# Patient Record
Sex: Male | Born: 1935 | Race: White | Hispanic: No | Marital: Married | Smoking: Former smoker
Health system: Southern US, Community
[De-identification: ages and names within clinical notes are randomized; demographics above are authoritative.]

## PROBLEM LIST (undated history)

## (undated) DIAGNOSIS — I6522 Occlusion and stenosis of left carotid artery: Secondary | ICD-10-CM

## (undated) DIAGNOSIS — Z91018 Allergy to other foods: Secondary | ICD-10-CM

## (undated) DIAGNOSIS — N201 Calculus of ureter: Secondary | ICD-10-CM

## (undated) DIAGNOSIS — J439 Emphysema, unspecified: Secondary | ICD-10-CM

## (undated) DIAGNOSIS — M1990 Unspecified osteoarthritis, unspecified site: Secondary | ICD-10-CM

## (undated) DIAGNOSIS — R0600 Dyspnea, unspecified: Secondary | ICD-10-CM

## (undated) DIAGNOSIS — C44309 Unspecified malignant neoplasm of skin of other parts of face: Secondary | ICD-10-CM

## (undated) DIAGNOSIS — F419 Anxiety disorder, unspecified: Secondary | ICD-10-CM

## (undated) DIAGNOSIS — I440 Atrioventricular block, first degree: Secondary | ICD-10-CM

## (undated) DIAGNOSIS — I6501 Occlusion and stenosis of right vertebral artery: Secondary | ICD-10-CM

## (undated) DIAGNOSIS — K559 Vascular disorder of intestine, unspecified: Secondary | ICD-10-CM

## (undated) DIAGNOSIS — G43909 Migraine, unspecified, not intractable, without status migrainosus: Secondary | ICD-10-CM

## (undated) DIAGNOSIS — R0609 Other forms of dyspnea: Secondary | ICD-10-CM

## (undated) DIAGNOSIS — G4730 Sleep apnea, unspecified: Secondary | ICD-10-CM

## (undated) DIAGNOSIS — R51 Headache: Secondary | ICD-10-CM

## (undated) DIAGNOSIS — E785 Hyperlipidemia, unspecified: Secondary | ICD-10-CM

## (undated) DIAGNOSIS — Z87442 Personal history of urinary calculi: Secondary | ICD-10-CM

## (undated) DIAGNOSIS — I714 Abdominal aortic aneurysm, without rupture, unspecified: Secondary | ICD-10-CM

## (undated) DIAGNOSIS — D126 Benign neoplasm of colon, unspecified: Secondary | ICD-10-CM

## (undated) DIAGNOSIS — L509 Urticaria, unspecified: Secondary | ICD-10-CM

## (undated) DIAGNOSIS — I739 Peripheral vascular disease, unspecified: Secondary | ICD-10-CM

## (undated) DIAGNOSIS — H9190 Unspecified hearing loss, unspecified ear: Secondary | ICD-10-CM

## (undated) DIAGNOSIS — K219 Gastro-esophageal reflux disease without esophagitis: Secondary | ICD-10-CM

## (undated) DIAGNOSIS — Z959 Presence of cardiac and vascular implant and graft, unspecified: Secondary | ICD-10-CM

## (undated) DIAGNOSIS — I10 Essential (primary) hypertension: Secondary | ICD-10-CM

## (undated) HISTORY — PX: LUMBAR DISC SURGERY: SHX700

## (undated) HISTORY — PX: SOFT TISSUE CYST EXCISION: SHX2418

## (undated) HISTORY — DX: Urticaria, unspecified: L50.9

## (undated) HISTORY — DX: Abdominal aortic aneurysm, without rupture, unspecified: I71.40

## (undated) HISTORY — DX: Abdominal aortic aneurysm, without rupture: I71.4

## (undated) HISTORY — PX: CARDIOVASCULAR STRESS TEST: SHX262

## (undated) HISTORY — DX: Benign neoplasm of colon, unspecified: D12.6

## (undated) HISTORY — DX: Vascular disorder of intestine, unspecified: K55.9

## (undated) HISTORY — PX: BACK SURGERY: SHX140

## (undated) HISTORY — PX: CARDIAC CATHETERIZATION: SHX172

---

## 1999-03-20 ENCOUNTER — Encounter: Payer: Self-pay | Admitting: Family Medicine

## 1999-03-20 ENCOUNTER — Encounter: Admission: RE | Admit: 1999-03-20 | Discharge: 1999-03-20 | Payer: Self-pay | Admitting: Family Medicine

## 2002-10-26 ENCOUNTER — Ambulatory Visit (HOSPITAL_COMMUNITY): Admission: RE | Admit: 2002-10-26 | Discharge: 2002-10-26 | Payer: Self-pay | Admitting: *Deleted

## 2002-10-26 ENCOUNTER — Encounter: Payer: Self-pay | Admitting: *Deleted

## 2005-08-09 ENCOUNTER — Encounter: Admission: RE | Admit: 2005-08-09 | Discharge: 2005-08-09 | Payer: Self-pay | Admitting: Internal Medicine

## 2006-10-30 ENCOUNTER — Emergency Department (HOSPITAL_COMMUNITY): Admission: EM | Admit: 2006-10-30 | Discharge: 2006-10-31 | Payer: Self-pay | Admitting: Emergency Medicine

## 2007-02-14 ENCOUNTER — Emergency Department (HOSPITAL_COMMUNITY): Admission: EM | Admit: 2007-02-14 | Discharge: 2007-02-14 | Payer: Self-pay | Admitting: Emergency Medicine

## 2010-06-17 ENCOUNTER — Ambulatory Visit
Admission: RE | Admit: 2010-06-17 | Discharge: 2010-06-17 | Disposition: A | Discharge status: Home / Self Care | Payer: Medicare Other | Source: Ambulatory Visit | Attending: Cardiovascular Disease | Admitting: Cardiovascular Disease

## 2010-06-17 ENCOUNTER — Other Ambulatory Visit: Payer: Self-pay | Admitting: Cardiovascular Disease

## 2010-06-17 DIAGNOSIS — R0602 Shortness of breath: Secondary | ICD-10-CM

## 2010-06-17 DIAGNOSIS — Z720 Tobacco use: Secondary | ICD-10-CM

## 2010-06-17 DIAGNOSIS — Z01811 Encounter for preprocedural respiratory examination: Secondary | ICD-10-CM

## 2010-06-19 NOTE — Cardiovascular Report (Signed)
NAME:  Douglas Monroe, Douglas Monroe                          ACCOUNT NO.:  [id number]   MEDICAL RECORD NO.:  [id number]                   PATIENT TYPE:  OIB   LOCATION:  2899                                 FACILITY:  [facility]   PHYSICIAN:  Darlin Priestly, M.D.             DATE OF BIRTH:  Sep 03, 1932   DATE OF PROCEDURE:  10/26/2002  DATE OF DISCHARGE:                              CARDIAC CATHETERIZATION   PROCEDURES:  1. Left heart catheterization.  2. Coronary angiography.  3. Left ventriculogram.  4. Abdominal aortogram.   ATTENDING PHYSICIAN:  Darlin Priestly, M.D.   COMPLICATIONS:  None.   INDICATIONS:  [name] is a 77 year old male patient of Dr. Newt Minion  with a history of hypertension, tobacco use who has intermittent substernal  chest pain occurring both at rest and with exertion radiating to his neck,  jaw and shoulders.  He did have a Cardiolite scan revealing no significant  ischemia.  However, he has had continuous ongoing  symptoms.  He is now  brought for cardiac catheterization to define his coronary anatomy.   DESCRIPTION OF OPERATION:  After obtaining informed written consent, the  patient was brought to the cardiac catheterization laboratory.  Right and  left groin was shaved and prepped and draped in the usual sterile fashion.  ECG monitor was established.  Using the modified Seldinger technique, a 6  French arterial sheath was inserted in the right femoral artery. A 6 French  diagnostic catheter was then used to performed diagnostic angiography.  This  reveals a large left main with no significant disease.  The LAD is a large  vessel that coursed to the apex and gives rise to one diagonal branch.  The  LAD has no significant disease.  The first diagonal is a large  vessel that  bifurcates distally and has no significant disease.   Left coronary artery gives rise to a large ramus intermedius which  bifurcates in its proximal portion.  There is no significant  disease in the  ramus.   Left circumflex is a large vessel which courses in AV groove and goes to two  obtuse marginal branches.  The AV groove circ has no significant disease.   The first obtuse marginal is a large vessel that bifurcates distally.  There  is no significant disease.  The second obtuse marginal is a small vessel  with no significant disease.   The right coronary artery is a large vessel and is dominant and gives rise  to be both PDA as well as posterior lateral branch. There is no significant  disease in the RCA, PDA or posterior lateral branch.   LEFT VENTRICULOGRAM:  Left ventriculogram reveals preserved EF of 60%.   ABDOMINAL AORTOGRAM:  Reveals no evidence of renal artery stenosis.   HEMODYNAMICS:  Systemic arterial pressure 162/82, LV pressure 164/4, LVEDP  of 10.   Following conclusion of the case, the  right femoral site was then closed  using an Angio-Seal device without complication.  The patient was  transferred to the recovery room in stable condition.   CONCLUSIONS:  1. No significant coronary artery disease.  2. Normal left ventricular systolic function.  3. No evidence of renal artery stenosis.  4. Systemic hypertension.                                                   Darlin Priestly, M.D.    RHM/MEDQ  D:  10/26/2002  T:  10/26/2002  Job:  [id number]

## 2010-06-22 ENCOUNTER — Ambulatory Visit (HOSPITAL_COMMUNITY)
Admission: RE | Admit: 2010-06-22 | Discharge: 2010-06-23 | Disposition: A | Discharge status: Home / Self Care | Payer: Medicare Other | Source: Ambulatory Visit | Attending: Cardiovascular Disease | Admitting: Cardiovascular Disease

## 2010-06-22 DIAGNOSIS — I70219 Atherosclerosis of native arteries of extremities with intermittent claudication, unspecified extremity: Secondary | ICD-10-CM | POA: Insufficient documentation

## 2010-06-22 DIAGNOSIS — J4489 Other specified chronic obstructive pulmonary disease: Secondary | ICD-10-CM | POA: Insufficient documentation

## 2010-06-22 DIAGNOSIS — J449 Chronic obstructive pulmonary disease, unspecified: Secondary | ICD-10-CM | POA: Insufficient documentation

## 2010-06-22 DIAGNOSIS — I701 Atherosclerosis of renal artery: Secondary | ICD-10-CM | POA: Insufficient documentation

## 2010-06-22 DIAGNOSIS — I714 Abdominal aortic aneurysm, without rupture, unspecified: Secondary | ICD-10-CM | POA: Insufficient documentation

## 2010-06-22 DIAGNOSIS — I10 Essential (primary) hypertension: Secondary | ICD-10-CM | POA: Insufficient documentation

## 2010-06-22 DIAGNOSIS — F17200 Nicotine dependence, unspecified, uncomplicated: Secondary | ICD-10-CM | POA: Insufficient documentation

## 2010-06-22 HISTORY — PX: ABDOMINAL ANGIOGRAM: SHX5705

## 2010-06-22 LAB — POCT ACTIVATED CLOTTING TIME
Activated Clotting Time: 240 seconds
Activated Clotting Time: 140 seconds

## 2010-06-23 LAB — BASIC METABOLIC PANEL
CO2: 26 mEq/L (ref 19–32)
Calcium: 9.1 mg/dL (ref 8.4–10.5)
Creatinine, Ser: 0.67 mg/dL (ref 0.4–1.5)
GFR calc Af Amer: >60 mL/min (ref >60)
GFR calc non Af Amer: >60 mL/min (ref >60)
Sodium: 140 mEq/L (ref 135–145)

## 2010-06-23 LAB — CBC
Hemoglobin: 13.7 g/dL (ref 13.0–17.0)
MCH: 32.4 pg (ref 26.0–34.0)
MCHC: 34.2 g/dL (ref 30.0–36.0)
RDW: 14.6 % (ref 11.5–15.5)

## 2010-06-25 NOTE — Procedures (Signed)
NAME:  AHMON, TOSI NO.:  [id number]  MEDICAL RECORD NO.:  [id number]           PATIENT TYPE:  O  LOCATION:  6531                         FACILITY:  [facility]  PHYSICIAN:  Nanetta Batty, M.D.   DATE OF BIRTH:  Jun 08, 1932  DATE OF PROCEDURE: DATE OF DISCHARGE:                   PERIPHERAL VASCULAR INVASIVE PROCEDURE   Douglas Monroe is a 77 year old thin-appearing married Caucasian male, father of two, grandfather of five grandchildren, who still works part-time in Clinical biochemist.  I last saw him 4 years ago.  He had progressive claudication with abnormal Dopplers.  His risk factors include ongoing tobacco abuse of 1 pack per day with COPD, as well as hypertension.  He has had a cath in the past that showed normal coronary arteries and normal LV function.  He complained of increasing lower extremity claudication, right greater than left, over the last 3-4 months.  Lower extremity Dopplers performed on May 26, 2010, revealed right ABI of 0.5 with high-grade external iliac artery stenosis, total right SFA popliteal, and left ABI 0.83 with high-grade left iliac stenosis.  He presents now for angiography and a potential intervention.  PROCEDURE DESCRIPTION:  The patient was brought to the second floor of [place] [facility] in the post absorptive state.  He was premedicated with p.o. Valium.  His left groin was prepped and shaved using the usual sterile fashion.  Xylocaine 1% was used for local anesthesia.  A 5-French sheath was inserted into the left femoral artery using standard Seldinger technique.  A 5-French pigtail catheter was used for abdominal aortography, bifemoral runoff using bolus chase digital subtraction step table technique.  Visipaque dye was used for the entirety of the case.  Retrograde aortic pressure were monitored during the case.  ANGIOGRAPHIC RESULTS: 1. Abdominal aorta.     a.     Renal arteries - 80% proximal left renal  stenosis.     b.     Infrarenal abdominal aorta - moderate focal narrowing in the      midportion followed by a saccular aneurysm which measured 3.1 cm      by duplex ultrasound in the office. 2. Left lower extremity.     a.     A 30% ostial left common iliac artery stenosis and pullback      gradient.     b.     A 90% left external iliac artery stenosis with a 50-mm      pullback.     c.     One-vessel runoff to the posterior tibial. 3. Right lower extremity.     a.     A 70% right external iliac artery stenosis with 40-mm      pullback.     b.     A 90% right common femoral artery stenosis.     c.     A 70% long segmental proximal right SFA with a moderately      long segment occlusion in the mid right SFA and three-vessel      runoff.  There was a 90% proximal right anterior tibial.  PROCEDURE DESCRIPTION:  The patient received 5000 units of  heparin intravenously with an ACT of 240.  Total contrast to the patient was 261.  The 5-French sheath was exchanged for a 6-French Destination crossover sheath.  A 0.025 Versacore wire crossed the left external iliac artery after pullback gradient noted.  No physiologic significance of the left common iliac with a 50% gradient of the left external iliac. PTA was performed with 5 x 3 balloon to allow passage of the Destination sheath without obstruction.  Contralateral access was obtained with a crossover catheter and Versacore wire.  PTA was performed of the right common femoral with a 5 x 2 balloon and stenting with a 8 x 3 Absolute Pro nitinol self-expanding stent.  Postdilatation was performed with a 7 x 3, resulting in reduction of 90% fairly focal right common femoral artery stenosis with two 0% residual.  Pullback was then performed with the Destination sheath across the proximal right external iliac artery revealing a 40-mm gradient and this was then stented with a 9 x 3 Absolute Pro nitinol self-expanding stent postdilated with a 7 x  3 balloon resulting in reduction of a 70% stenosis to 0% residual. Sheaths were then withdrawn across the bifurcation and the left external iliac artery was then stented with an 8 x 4 Absolute Pro and postdilated with a 73 balloon resulting in reduction of 90% stenosis to 0% residual. The patient tolerated the procedure well.  The guidewire was removed. The patient left the lab in stable condition.  Received 600 mg of Plavix p.o. and will be treated with aspirin and Plavix.  He will be gently hydrated overnight and discharged home in the morning.  He will get lower extremity Dopplers and ABIs in the office and will see me back. At that time, we will decide whether or not he needs staged right SFA intervention for lifestyle-limiting claudication.     Nanetta Batty, M.D.     Cordelia Pen  D:  06/22/2010  T:  06/22/2010  Job:  [id number]  cc:   [facility]. [facility]. Robyn N. Allyne Gee, M.D.  Electronically Signed by Nanetta Batty M.D. on 06/25/2010 03:43:41 PM

## 2010-07-15 NOTE — Discharge Summary (Signed)
NAME:  Douglas Monroe, Douglas Monroe                ACCOUNT NO.:  [id number]  MEDICAL RECORD NO.:  [id number]           PATIENT TYPE:  O  LOCATION:  6531                         FACILITY:  [facility]  PHYSICIAN:  Nanetta Batty, M.D.   DATE OF BIRTH:  1932-02-21  DATE OF ADMISSION:  06/22/2010 DATE OF DISCHARGE:  06/23/2010                              DISCHARGE SUMMARY   DISCHARGE DIAGNOSES: 1. Claudication of lower extremities with abnormal Dopplers. 2. Peripheral arterial disease with right common femoral artery     stenosis undergoing percutaneous transluminal angioplasty and     stent, right external iliac artery stenosis, undergoing     percutaneous transluminal angioplasty and stent, and left external     iliac artery stenosis undergoing percutaneous transluminal     angioplasty and stent. 3. Tobacco use. 4. Chronic obstructive pulmonary disease. 5. History of normal coronaries, normal EF by cardiac cath. 6. History of moderate disease in left carotid bulb.  DISCHARGE CONDITION:  Stable.  PROCEDURES:  PV angiogram by Dr. Nanetta Batty Jun 22, 2010.  PTA and stent to the right CFA.  Right EIA and left EIA, all by Dr. Allyson Sabal on Jun 22, 2010.  Please see dictated report.  Please note the patient may need staged right SFA.  HISTORY OF PRESENT ILLNESS:  A 77 year old white male who works part- time, referred to Dr. Allyson Sabal for claudication and abnormal Doppler study. He does have a history of COPD and smokes one-pack a day.  He has hypertension though he has normal course on his last cardiac catheterization in September 2004.  He denies chest pain.  He recently had lower extremity Dopplers due to leg pain that revealed ABI on the right 0.5 with high-grade right external iliac stenosis and total right SFA and popliteal.  His left ABI was 0.83 with a high-grade left iliac stenosis.  Also to be noted, his last carotid Doppler in our office 4 years ago showed moderate disease in his left carotid  bulb and this has not been rechecked.  Dr. Allyson Sabal felt the patient needed to undergo PV angiogram due to lifestyle-limiting claudication.  He was brought in and underwent procedure, which he tolerated well.  By the next morning, sodium 140, potassium 3.5, BUN 14, creatinine 0.67, glucose 94, hemoglobin 13.7, hematocrit 40.1, WBC 8.3, platelets 148.  Blood pressure 144/70, pulse 74, respiratory rate 23, temperature 98.3, oxygen saturation 91%. HEART:  Regular rate and rhythm. LUNGS:  Few crackles. ABDOMEN:  Soft. EXTREMITIES:  No bruising at cath site.  Both legs were warm to touch.  Dr. Clarene Duke saw him.  He discussed stopping tobacco use.  Lipitor was added to his medical regimen.  Tobacco cessation consultation was also obtained.  The patient ambulated and was discharged home.  DISCHARGE INSTRUCTIONS: 1. Increase activity slowly.  May shower.  No lifting for 1 week.  No     driving for 2 days.  Low-sodium heart-healthy diet.  Wash cath site     with soap and water.  Call if any bleeding, swelling, or drainage. 2. Follow up with Dr. Allyson Sabal July 21, 2010, at 1:45 p.m. 3. Followup  Dopplers are July 16, 2010, at 4 p.m.  DISCHARGE MEDICATION RECONCILIATION:  Please see form to the [facility] computer, but note we did add Lipitor 20 mg daily, Plavix 75 daily, and aspirin.  The patient will follow up as instructed.     Darcella Gasman. Annie Paras, N.P.   ______________________________ Nanetta Batty, M.D.    LRI/MEDQ  D:  06/23/2010  T:  06/24/2010  Job:  [id number]  cc:   Candyce Churn. Allyne Gee, M.D.  Electronically Signed by Nada Boozer N.P. on 07/01/2010 04:36:26 PM Electronically Signed by Nanetta Batty M.D. on 07/15/2010 10:44:00 AM

## 2010-10-08 ENCOUNTER — Ambulatory Visit (HOSPITAL_COMMUNITY)
Admission: RE | Admit: 2010-10-08 | Discharge: 2010-10-09 | Disposition: A | Discharge status: Home / Self Care | Payer: Medicare Other | Source: Ambulatory Visit | Attending: Cardiovascular Disease | Admitting: Cardiovascular Disease

## 2010-10-08 DIAGNOSIS — J4489 Other specified chronic obstructive pulmonary disease: Secondary | ICD-10-CM | POA: Insufficient documentation

## 2010-10-08 DIAGNOSIS — I70219 Atherosclerosis of native arteries of extremities with intermittent claudication, unspecified extremity: Secondary | ICD-10-CM | POA: Insufficient documentation

## 2010-10-08 DIAGNOSIS — I7092 Chronic total occlusion of artery of the extremities: Secondary | ICD-10-CM | POA: Insufficient documentation

## 2010-10-08 DIAGNOSIS — I10 Essential (primary) hypertension: Secondary | ICD-10-CM | POA: Insufficient documentation

## 2010-10-08 DIAGNOSIS — J449 Chronic obstructive pulmonary disease, unspecified: Secondary | ICD-10-CM | POA: Insufficient documentation

## 2010-10-08 DIAGNOSIS — Z01812 Encounter for preprocedural laboratory examination: Secondary | ICD-10-CM | POA: Insufficient documentation

## 2010-10-08 HISTORY — PX: ABDOMINAL ANGIOGRAM: SHX5705

## 2010-10-08 LAB — CBC
Hemoglobin: 13.5 g/dL (ref 13.0–17.0)
MCH: 31.5 pg (ref 26.0–34.0)
MCHC: 33.5 g/dL (ref 30.0–36.0)
Platelets: 164 10*3/uL (ref 150–400)
RBC: 4.29 MIL/uL (ref 4.22–5.81)

## 2010-10-08 LAB — BASIC METABOLIC PANEL
BUN: 22 mg/dL (ref 6–23)
Creatinine, Ser: 0.87 mg/dL (ref 0.50–1.35)
GFR calc Af Amer: >60 mL/min (ref >60)
GFR calc non Af Amer: >60 mL/min (ref >60)
Potassium: 4.1 mEq/L (ref 3.5–5.1)

## 2010-10-08 LAB — PROTIME-INR
INR: 0.89 (ref 0.00–1.49)
Prothrombin Time: 12.2 seconds (ref 11.6–15.2)

## 2010-10-08 LAB — POCT ACTIVATED CLOTTING TIME: Activated Clotting Time: 254 seconds

## 2010-10-09 LAB — CBC
MCH: 32 pg (ref 26.0–34.0)
MCV: 95.6 fL (ref 78.0–100.0)
Platelets: 155 10*3/uL (ref 150–400)
RDW: 14.2 % (ref 11.5–15.5)

## 2010-10-09 LAB — BASIC METABOLIC PANEL
Calcium: 9.4 mg/dL (ref 8.4–10.5)
Creatinine, Ser: 0.95 mg/dL (ref 0.50–1.35)
GFR calc Af Amer: >60 mL/min (ref >60)
GFR calc non Af Amer: >60 mL/min (ref >60)
Sodium: 140 mEq/L (ref 135–145)

## 2010-10-22 LAB — CBC
MCHC: 33.8
RBC: 4.57
WBC: 7.9

## 2010-10-22 LAB — POCT CARDIAC MARKERS
CKMB, poc: 1.1
Myoglobin, poc: 56.8

## 2010-10-22 LAB — BASIC METABOLIC PANEL
CO2: 28
Calcium: 9.2
Creatinine, Ser: 0.87
GFR calc Af Amer: >60
GFR calc non Af Amer: >60

## 2010-10-22 LAB — DIFFERENTIAL
Basophils Relative: 0
Lymphocytes Relative: 17
Monocytes Relative: 8
Neutro Abs: 5.7
Neutrophils Relative %: 72

## 2010-11-01 NOTE — Discharge Summary (Signed)
  NAME:  Douglas Monroe, Douglas Monroe NO.:  [id number]  MEDICAL RECORD NO.:  [id number]  LOCATION:  2506                         FACILITY:  [facility]  PHYSICIAN:  Nanetta Batty, M.D.   DATE OF BIRTH:  08-Mar-1932  DATE OF ADMISSION:  10/08/2010 DATE OF DISCHARGE:  10/09/2010                              DISCHARGE SUMMARY   DISCHARGE DIAGNOSES: 1. Claudication. 2. Peripheral vascular disease with elective right superficial femoral     artery stenting this admission.  Prior history of right common     femoral stenting in May 2012, right external iliac stenting in May     2012, and left external iliac artery stenting in May 2012. 3. Hypertension with a history of 80% left renal artery narrowing, May     2012. 4. Chronic obstructive pulmonary disease. 5. Normal coronaries in 2004.  HOSPITAL COURSE:  The patient is a 77 year old male with a history of claudication.  He underwent a multisite intervention in May 2012.  He continues to have claudication in his right leg.  He was admitted for elective PV angiogram.  This was done on October 08, 2010.  He had a totally occluded mid right SFA which was dilated and stented.  See Dr. Hazle Coca note for complete details.  He tolerated procedure well and will be discharged on October 09, 2010.  Please see med rec for complete discharge medications.  DISCHARGE LABORATORY DATA:  Sodium 140, potassium 3.9, BUN 19, and creatinine 0.95.  White count 7.3, hemoglobin 13.1, hematocrit 39.2, and platelets 155.  X-ray shows hyperinflation with no acute disease, this was in May 2012.  Telemetry shows sinus rhythm.  DISPOSITION:  The patient will be discharged in stable condition and will follow up with Dr. Allyson Sabal after lower extremity arterial Dopplers.     Abelino Derrick, P.A.   ______________________________ Nanetta Batty, M.D.    Lenard Lance  D:  10/09/2010  T:  10/09/2010  Job:  [id number]  Electronically Signed by Corine Shelter P.A. on  10/09/2010 04:53:13 PM Electronically Signed by Nanetta Batty M.D. on 11/01/2010 11:42:33 AM

## 2010-11-01 NOTE — Procedures (Signed)
NAME:  Douglas Monroe, Douglas Monroe NO.:  [id number]  MEDICAL RECORD NO.:  [id number]  LOCATION:  2506                         FACILITY:  [facility]  PHYSICIAN:  Nanetta Batty, M.D.   DATE OF BIRTH:  10-Jan-1933  DATE OF PROCEDURE: DATE OF DISCHARGE:                   PERIPHERAL VASCULAR INVASIVE PROCEDURE   PROCEDURE:  Peripheral angiogram/percutaneous transluminal angioplasty stent.  INDICATIONS FOR PROCEDURE:  Ms. Micalizzi is a 77 year old thin-appearing Caucasian male, father of two, grandfather of 5 grandchildren, who works part time Clinical biochemist.  He was referred to me for claudication. His risk factors include discontinued tobacco abuse, COPD, hypertension, and hyperlipidemia.  He was cathed on July 06, 2010, and found to have normal coronary arteries and normal LV function.  He has been complaining of right greater than left lower extremity claudication with abnormal ABIs bilaterally.  I recently did an angiogram on him and stented both right and left iliac arteries.  He has a total right SFA with continued lifestyle-limiting claudication.  He presents now for attempted PTA and stenting of his right SFA initially using the [employer] crossing device."  PROCEDURE DESCRIPTION:  The patient was brought to the second floor [place] [facility] angi[facility] suite in postabsorptive state.  He was premedicated with p.o. Valium, IV Versed, and fentanyl.  His left groin was prepped and shaved in usual sterile fashion.  Xylocaine 1% was used for local anesthesia.  A 7-French crossover sheath was inserted to the left femoral artery using standard Seldinger technique.  Contralateral access was obtained with a crossover catheter and Wholey wire.  The patient received 4000 units of heparin intravenously with a beginning ACT of 215 and ending of approximately 199.  A total 169 mL of contrast was administered to the patient __________.  Visipaque dye was used for the  entirety of the case.  Retrograde aortic pressure was monitored during the case.  Attempts were made to use the TruePath crossing device.  However, I think we selected a false lumen and we were subintimal.  After multiple attempts using the 0.18 CTX crossing catheter, I exchanged for a 0.35 Terumo NaviCross directional crossing catheter and angled Glidewire which fairly easily crossed the lesion and went into the true lumen which was demonstrated angiographically.  This was then exchanged for a 0.35 VersaCore.  PTA was performed with a 4 x 10 balloon, stenting with a 6 x 120 EV3 Protege nitinol self-expanding stent.  Postdilatation with a 5 x 10 balloon, resulting in reduction of a moderate right SFA CTO to 0% residual.  Completion angiography revealed it to be widely patent with three-vessel runoff and high-grade anterior tibial disease.  It should be noted that the whole proximal segment of the SFA was diffusely 50% to 60% stenosed.  IMPRESSION:  Successful percutaneous transluminal angioplasty and stenting of a moderately long segment mid-right superficial femoral artery chronic total occlusion using a nitinol self-expanding stent. The patient will be treated with aspirin and Plavix, hydrate overnight, and discharged home in the morning.  I will get followup Dopplers and ABIs and he will see me back in the office in followup.  He left the lab in stable condition.     Nanetta Batty, M.D.  JB/MEDQ  D:  10/08/2010  T:  10/08/2010  Job:  [id number]  cc:   [facility] Robyn N. Allyne Gee, M.D. [facility] Second Floor [facility]  Electronically Signed by Nanetta Batty M.D. on 11/01/2010 11:42:38 AM

## 2010-11-12 LAB — I-STAT 8, (EC8 V) (CONVERTED LAB)
Acid-Base Excess: 1
Bicarbonate: 28.1 — ABNORMAL HIGH
HCT: 44
Operator id: 277751
TCO2: 30
pCO2, Ven: 51.6 — ABNORMAL HIGH

## 2010-11-12 LAB — CBC
MCHC: 34.2
RBC: 4.21 — ABNORMAL LOW
RDW: 14.2 — ABNORMAL HIGH

## 2010-11-12 LAB — DIFFERENTIAL
Basophils Absolute: 0.1
Basophils Relative: 1
Monocytes Relative: 11
Neutro Abs: 3.8
Neutrophils Relative %: 52

## 2010-11-12 LAB — POCT I-STAT CREATININE: Creatinine, Ser: 1

## 2011-08-03 ENCOUNTER — Other Ambulatory Visit: Payer: Self-pay | Admitting: Cardiovascular Disease

## 2011-08-04 ENCOUNTER — Encounter (HOSPITAL_COMMUNITY): Payer: Self-pay | Admitting: Pharmacy Technician

## 2011-08-19 ENCOUNTER — Encounter (HOSPITAL_COMMUNITY)
Admission: RE | Disposition: A | Discharge status: Home / Self Care | Payer: Self-pay | Source: Ambulatory Visit | Attending: Cardiovascular Disease

## 2011-08-19 ENCOUNTER — Encounter (HOSPITAL_COMMUNITY): Payer: Self-pay | Admitting: General Practice

## 2011-08-19 ENCOUNTER — Ambulatory Visit (HOSPITAL_COMMUNITY)
Admission: RE | Admit: 2011-08-19 | Discharge: 2011-08-20 | Disposition: A | Discharge status: Home / Self Care | Payer: Medicare Other | Source: Ambulatory Visit | Attending: Cardiovascular Disease | Admitting: Cardiovascular Disease

## 2011-08-19 DIAGNOSIS — I10 Essential (primary) hypertension: Secondary | ICD-10-CM | POA: Diagnosis present

## 2011-08-19 DIAGNOSIS — I739 Peripheral vascular disease, unspecified: Secondary | ICD-10-CM | POA: Diagnosis present

## 2011-08-19 DIAGNOSIS — Z95820 Peripheral vascular angioplasty status with implants and grafts: Secondary | ICD-10-CM

## 2011-08-19 DIAGNOSIS — J449 Chronic obstructive pulmonary disease, unspecified: Secondary | ICD-10-CM | POA: Insufficient documentation

## 2011-08-19 DIAGNOSIS — E785 Hyperlipidemia, unspecified: Secondary | ICD-10-CM | POA: Insufficient documentation

## 2011-08-19 DIAGNOSIS — I70219 Atherosclerosis of native arteries of extremities with intermittent claudication, unspecified extremity: Secondary | ICD-10-CM | POA: Insufficient documentation

## 2011-08-19 DIAGNOSIS — J4489 Other specified chronic obstructive pulmonary disease: Secondary | ICD-10-CM | POA: Insufficient documentation

## 2011-08-19 HISTORY — DX: Peripheral vascular disease, unspecified: I73.9

## 2011-08-19 HISTORY — PX: ATHERECTOMY: SHX5502

## 2011-08-19 HISTORY — DX: Other forms of dyspnea: R06.09

## 2011-08-19 HISTORY — DX: Gastro-esophageal reflux disease without esophagitis: K21.9

## 2011-08-19 HISTORY — DX: Dyspnea, unspecified: R06.00

## 2011-08-19 HISTORY — PX: ABDOMINAL ANGIOGRAM: SHX5705

## 2011-08-19 HISTORY — DX: Essential (primary) hypertension: I10

## 2011-08-19 HISTORY — DX: Sleep apnea, unspecified: G47.30

## 2011-08-19 LAB — CBC
HCT: 41.7 % (ref 39.0–52.0)
Hemoglobin: 14 g/dL (ref 13.0–17.0)
MCHC: 33.6 g/dL (ref 30.0–36.0)
MCV: 92.9 fL (ref 78.0–100.0)

## 2011-08-19 LAB — POCT ACTIVATED CLOTTING TIME: Activated Clotting Time: 189 s

## 2011-08-19 SURGERY — ATHERECTOMY
Anesthesia: LOCAL

## 2011-08-19 MED ORDER — SODIUM CHLORIDE 0.9 % IV SOLN
INTRAVENOUS | Status: AC
  Administered 2011-08-19: 20:00:00 via INTRAVENOUS
  Administered 2011-08-19: 75 mL/h via INTRAVENOUS

## 2011-08-19 MED ORDER — ATORVASTATIN CALCIUM 20 MG PO TABS
20.0000 mg | ORAL_TABLET | Freq: Every morning | ORAL | Status: DC
  Filled 2011-08-19: qty 1

## 2011-08-19 MED ORDER — FENTANYL CITRATE 0.05 MG/ML IJ SOLN
INTRAMUSCULAR | Status: AC
  Filled 2011-08-19: qty 2

## 2011-08-19 MED ORDER — HEPARIN SODIUM (PORCINE) 1000 UNIT/ML IJ SOLN
INTRAMUSCULAR | Status: AC
  Filled 2011-08-19: qty 1

## 2011-08-19 MED ORDER — SODIUM CHLORIDE 0.9 % IJ SOLN: 3.0000 mL | INTRAMUSCULAR | Status: DC | PRN

## 2011-08-19 MED ORDER — ONDANSETRON HCL 4 MG/2ML IJ SOLN: 4.0000 mg | Freq: Four times a day (QID) | INTRAMUSCULAR | Status: DC | PRN

## 2011-08-19 MED ORDER — TIOTROPIUM BROMIDE MONOHYDRATE 18 MCG IN CAPS
18.0000 ug | ORAL_CAPSULE | Freq: Every day | RESPIRATORY_TRACT | Status: DC
  Filled 2011-08-19: qty 5

## 2011-08-19 MED ORDER — HEPARIN (PORCINE) IN NACL 2-0.9 UNIT/ML-% IJ SOLN
INTRAMUSCULAR | Status: AC
  Filled 2011-08-19: qty 1000

## 2011-08-19 MED ORDER — NEBIVOLOL HCL 10 MG PO TABS
10.0000 mg | ORAL_TABLET | Freq: Every morning | ORAL | Status: DC
  Filled 2011-08-19: qty 1

## 2011-08-19 MED ORDER — CLOPIDOGREL BISULFATE 75 MG PO TABS: 75.0000 mg | ORAL_TABLET | Freq: Every morning | ORAL | Status: DC

## 2011-08-19 MED ORDER — DIAZEPAM 5 MG PO TABS
5.0000 mg | ORAL_TABLET | ORAL | Status: AC
  Administered 2011-08-19: 5 mg via ORAL

## 2011-08-19 MED ORDER — DIAZEPAM 5 MG PO TABS
ORAL_TABLET | ORAL | Status: AC
  Administered 2011-08-19: 5 mg via ORAL
  Filled 2011-08-19: qty 1

## 2011-08-19 MED ORDER — CLOPIDOGREL BISULFATE 75 MG PO TABS
75.0000 mg | ORAL_TABLET | Freq: Every day | ORAL | Status: DC
  Filled 2011-08-19: qty 1

## 2011-08-19 MED ORDER — FINASTERIDE 5 MG PO TABS
5.0000 mg | ORAL_TABLET | Freq: Every morning | ORAL | Status: DC
  Filled 2011-08-19: qty 1

## 2011-08-19 MED ORDER — FAMOTIDINE 20 MG PO TABS
20.0000 mg | ORAL_TABLET | Freq: Every day | ORAL | Status: DC
  Administered 2011-08-19: 17:00:00 20 mg via ORAL
  Filled 2011-08-19 (×2): qty 1

## 2011-08-19 MED ORDER — AMLODIPINE BESYLATE 5 MG PO TABS
5.0000 mg | ORAL_TABLET | Freq: Every evening | ORAL | Status: DC
  Administered 2011-08-19: 5 mg via ORAL
  Filled 2011-08-19 (×2): qty 1

## 2011-08-19 MED ORDER — ALBUTEROL SULFATE HFA 108 (90 BASE) MCG/ACT IN AERS
2.0000 | INHALATION_SPRAY | Freq: Four times a day (QID) | RESPIRATORY_TRACT | Status: DC | PRN
  Administered 2011-08-20: 01:00:00 2 via RESPIRATORY_TRACT
  Filled 2011-08-19: qty 6.7

## 2011-08-19 MED ORDER — LIDOCAINE HCL (PF) 1 % IJ SOLN
INTRAMUSCULAR | Status: AC
  Filled 2011-08-19: qty 30

## 2011-08-19 MED ORDER — SODIUM CHLORIDE 0.9 % IV SOLN
INTRAVENOUS | Status: DC
  Administered 2011-08-19: 1000 mL via INTRAVENOUS

## 2011-08-19 MED ORDER — ASPIRIN EC 325 MG PO TBEC
325.0000 mg | DELAYED_RELEASE_TABLET | Freq: Every day | ORAL | Status: DC
  Filled 2011-08-19: qty 1

## 2011-08-19 MED ORDER — ACETAMINOPHEN 325 MG PO TABS
650.0000 mg | ORAL_TABLET | ORAL | Status: DC | PRN
  Administered 2011-08-19: 20:00:00 650 mg via ORAL
  Filled 2011-08-19: qty 2

## 2011-08-19 NOTE — Op Note (Signed)
Douglas Monroe is a 78 y.o. male    [id number] LOCATION:  FACILITY: [facility]  PHYSICIAN: Nanetta Batty, M.D. November 14, 1932   DATE OF PROCEDURE:  08/19/2011  DATE OF DISCHARGE:  [facility]  PV Intervention    History obtained from chart review. Douglas Monroe is a 78-year-old mildly overweight married Caucasian male father 2, grandfather to 5 grandchildren who I recently saw in the office on 07/30/2011. He works [employer] and was originally referred to me for claudication. He stopped smoking a year ago and has COPD, hypertension and hyperlipidemia. He heart catheterization back in 2004 by Dr. Lenise Herald with some have normal coronary arteries and normal left function. His initial lower extremity Dopplers revealed a right ABI  Of 0.83 para angiogram to revealing high-grade bilateral iliac disease, both of which I stented, occluded right SFA which recanalized. These also were noted to have a lipid panel we found 80% left renal artery stenosis 2 department with ultrasound. He also has moderate left carotid disease. He is neurologically asymptomatic. He has noticed recurrent left fluctuance claudication with Dopplers that show a left ABI folic a 0.45 with high-grade iliac disease. He presents now for angiography and potential intervention.   PROCEDURE DESCRIPTION:    The patient was brought to the second floor  [place] [facility] lab in the postabsorptive state. He was  premedicated with Valium 5 mg by mouth, IV Versed and fentanyl.[name] His left groin was prepped and shaved in usual sterile fashion. Xylocaine 1% was used  for local anesthesia. A 5 French sheath was inserted into the left common femoral  artery using standard Seldinger technique. The patient received  5000 units  of heparin  intravenously.  Total contrast used during the case was 129 cc. The ACT was 239. A 5 French pigtail catheter was used for abdominal aortography with bifemoral runoff  using bolus chase digital subtraction step table technique. Visipaque dye was used for the entirety of the case. Retrograde aorta pressure was monitored during the case.    HEMODYNAMICS:    AO SYSTOLIC/AO DIASTOLIC: 168/83    ANGIOGRAPHIC RESULTS:   1: Abdominal aortogram-70% left renal artery stenosis. There was moderate infrarenal abdominal aortic atherosclerotic narrowing as well as a small saccular abdominal retractors and just above the iliac bifurcation.  2: Left lower extremity-80% ostial left common iliac artery stenosis. The left common iliac artery stent  was widely patent however distal to this  the extra iliac and common femoral arteries there was 70% diffuse stenosis with occlusion using a 5 French catheter.  3: Right lower extremity-widely patent right iliac artery stent  Procedure description: Using an 035  wire and a 5 mm x 2 cm long balloon angioplasty was performed at the common femoral artery allowing passage of a 7 French bright tip sheath. The sheath was advanced up into the proximal left common iliac artery and stenting was performed a at the ostium of a 6 mm x 20 mm long ICAST  covered stent at 12 atmospheres. Post dilatation was performed for 7 x 2 balloon at 10 atmospheres. Following this stenting of the left external iliac artery and common femoral artery was performed using an 8 mm x 100 mm long Abbott absolute pro nitinol self-expanding stent and this was then postdilated with sequential overlapping inflations using a 7 mm x 4 cm long balloon a 2 atmospheres resulting in reduction of the long 80 and 90% stenosis to 0% residual. A 7 French bright  tip sheath was exchanged for a short 7 [place] sheath an the wire was removed. The patient left the lateral stable condition. The sheath will be removed once the ACT falls below 170. Patient will be hydrated overnight.    IMPRESSION: Successful PTCA and stenting using an eye cast covered stent of the ostium of the left common  iliac artery, and PTA and stenting of the left external and common femoral arteries using a nitinol self-expanding stent for lifestyle limiting claudication. Patient was discharged in the morning. He is oriented aspirin Plavix. With a followup Dopplers in the office and will see me back after that  [name]. MD, [facility] 08/19/2011 10:34 AM

## 2011-08-20 ENCOUNTER — Encounter (HOSPITAL_COMMUNITY): Payer: Self-pay | Admitting: Cardiology

## 2011-08-20 DIAGNOSIS — J449 Chronic obstructive pulmonary disease, unspecified: Secondary | ICD-10-CM | POA: Diagnosis present

## 2011-08-20 DIAGNOSIS — Z95820 Peripheral vascular angioplasty status with implants and grafts: Secondary | ICD-10-CM

## 2011-08-20 DIAGNOSIS — I739 Peripheral vascular disease, unspecified: Secondary | ICD-10-CM | POA: Diagnosis present

## 2011-08-20 DIAGNOSIS — I10 Essential (primary) hypertension: Secondary | ICD-10-CM | POA: Diagnosis present

## 2011-08-20 HISTORY — DX: Essential (primary) hypertension: I10

## 2011-08-20 LAB — BASIC METABOLIC PANEL
CO2: 25 mEq/L (ref 19–32)
Calcium: 8.5 mg/dL (ref 8.4–10.5)
Creatinine, Ser: 0.95 mg/dL (ref 0.50–1.35)
Glucose, Bld: 98 mg/dL (ref 70–99)
Sodium: 140 mEq/L (ref 135–145)

## 2011-08-20 LAB — CBC
MCH: 30.8 pg (ref 26.0–34.0)
MCV: 92.7 fL (ref 78.0–100.0)
Platelets: 161 10*3/uL (ref 150–400)
RBC: 3.99 MIL/uL — ABNORMAL LOW (ref 4.22–5.81)

## 2011-08-20 MED ORDER — ASPIRIN 81 MG PO TABS: 81.0000 mg | ORAL_TABLET | Freq: Every day | ORAL | Status: AC

## 2011-08-20 NOTE — Progress Notes (Signed)
Subjective: No complaints  Objective: Vital signs in last 24 hours: Temp:  [97.8 F (36.6 C)-98.3 F (36.8 C)] 98.3 F (36.8 C) (07/19 0734) Pulse Rate:  [58-72] 72  (07/19 0734) Resp:  [16-24] 17  (07/19 0734) BP: (123-167)/(59-119) 136/67 mmHg (07/19 0734) SpO2:  [92 %-100 %] 94 % (07/19 0734) Weight:  [70 kg (154 lb 5.2 oz)] 70 kg (154 lb 5.2 oz) (07/19 0010) Weight change:  Last BM Date: 08/18/11 Intake/Output from previous day: +838 07/18 0701 - 07/19 0700 In: 1313 [P.O.:1000; I.V.:313] Out: 475 [Urine:475] Intake/Output this shift:    PE: General:alert and oriented Heart:S1S2 RRR Lungs:clear - diffuse expiratory wheezes.; non-labored. Abd:+ BS Ext:no edema, cath site with small hematoma, mild bruising Neuro:alert and oriented   Lab Results:  [facility] 08/20/11 0637 08/19/11 0711  WBC 6.7 8.2  HGB 12.3* 14.0  HCT 37.0* 41.7  PLT 161 180   BMET  Basename 08/20/11 0637  NA 140  K 3.6  CL 105  CO2 25  GLUCOSE 98  BUN 16  CREATININE 0.95  CALCIUM 8.5     Studies/Results: PV ANGIOGRAM: IMPRESSION: Successful PTCA and stenting using an I-cast covered stent of the ostium of the left common iliac artery, and PTA and stenting of the left external and common femoral arteries using a nitinol self-expanding stent for lifestyle limiting claudication--08/19/11    Medications: I have reviewed the patient's current medications.    [name] amLODipine  5 mg Oral QPM  . aspirin EC  325 mg Oral Daily  . atorvastatin  20 mg Oral q morning - 10a  . clopidogrel  75 mg Oral Q breakfast  . famotidine  20 mg Oral Daily  . finasteride  5 mg Oral q morning - 10a  . nebivolol  10 mg Oral q morning - 10a  . tiotropium  18 mcg Inhalation Daily  . DISCONTD: clopidogrel  75 mg Oral q morning - 10a   Assessment/Plan: Patient Active Problem List  Diagnosis  . PVD (peripheral vascular disease) with claudication, previous stents to bilateral iliacs. Rt. SFA also.   . Claudication of  left lower extremity, with known stent in iliac  . S/P angioplasty with stent of ostium of Lt. common iliac artery and PTA and stent of Lt external and common femoral arteries. 08/19/11.  [name] COPD (chronic obstructive pulmonary disease)  . HTN (hypertension)   PLAN: Stable after PV intervention.  Ambulates without problems.  For d/c today and follow up dopplers then follow up with Dr. Allyson Sabal.   LOS: 1 day   INGOLD,LAURA R 08/20/2011, 11:24 AM  I have seen & examined Douglas Monroe today following his L Iliac intervention.  He has been walking without claudication.  I agree with Douglas Monroe findings, exam & plan to discharge today.  ASA/Plavix.  ON BB & CCB for BP.  Statin.   Marykay Lex, M.D., M.S. [facility] [address] [place], [place]  [address]  [phone] Pager # [phone]  08/20/2011 11:59 AM

## 2011-08-23 NOTE — Discharge Summary (Signed)
Physician Discharge Summary  Patient ID: Douglas Monroe MRN: [id number] DOB/AGE: 04-24-1932 78 y.o.  Admit date: 08/19/2011 Discharge date: 08/23/2011  Discharge Diagnoses:  Principal Problem:  *Claudication of left lower extremity, with known stent in iliac Active Problems:  PVD (peripheral vascular disease) with claudication, previous stents to bilateral iliacs. Rt. SFA also.   S/P angioplasty with stent of ostium of Lt. common iliac artery and PTA and stent of Lt external and common femoral arteries. 08/19/11.  COPD (chronic obstructive pulmonary disease)  HTN (hypertension)   Discharged Condition: good  Procedures: 08/19/2011 [place] angiogram secondary to lifestyle limiting claudication and stent in the iliac. 08/19/2011 successful PTA And stenting using an eye cast covered stent of the ostium of the left common iliac artery and PTA/stenting of the left external and common femoral arteries by Dr. Allyson Sabal.   Hospital Course: Douglas Monroe is a 78 year old mildly overweight married Caucasian male father 2, grandfather to 5 grandchildren who was evaluated in the office on 07/30/2011. He works part-time and was originally referred to Dr. Allyson Sabal for claudication. He stopped smoking a year ago and has COPD, hypertension and hyperlipidemia. He heart catheterization back in 2004 by Dr. Lenise Herald with some have normal coronary arteries and normal left function. His initial lower extremity Dopplers revealed a right ABI Of 0.83 para angiogram to revealing high-grade bilateral iliac disease, both of which were stented, occluded right SFA which recanalized. Also we found 80% left renal artery stenosis with ultrasound. He also has moderate left carotid disease. He is neurologically asymptomatic. He had noticed recurrent left fluctuance claudication with Dopplers that show a left ABI folic a 0.45 with high-grade iliac disease. He presented 08/19/11 for angiography and potential intervention.  PV angio:1:  Abdominal aortogram-70% left renal artery stenosis. There was moderate infrarenal abdominal aortic atherosclerotic narrowing as well as a small saccular abdominal retractors and just above the iliac bifurcation.  2: Left lower extremity-80% ostial left common iliac artery stenosis. The left common iliac artery stent was widely patent however distal to this the extra iliac and common femoral arteries there was 70% diffuse stenosis with occlusion using a 5 French catheter.  3: Right lower extremity-widely patent right iliac artery stent   Procedure description: Using an 035  wire and a 5 mm x 2 cm long balloon angioplasty was performed at the common femoral artery allowing passage of a 7 French bright tip sheath. The sheath was advanced up into the proximal left common iliac artery and stenting was performed a at the ostium of a 6 mm x 20 mm long ICAST covered stent at 12 atmospheres. Post dilatation was performed for 7 x 2 balloon at 10 atmospheres. Following this stenting of the left external iliac artery and common femoral artery was performed using an 8 mm x 100 mm long Abbott absolute pro nitinol self-expanding stent and this was then postdilated with sequential overlapping inflations using a 7 mm x 4 cm long balloon a 2 atmospheres resulting in reduction of the long 80 and 90% stenosis to 0% residual. A 7 French bright tip sheath was exchanged for a short 7 [place] sheath an the wire was removed.  By the next morning the patient had no complaints and Ambulated without complications.  He was seen and evaluated by Dr. Herbie Baltimore felt the patient was stable and ready for discharge. Plan to continue aspirin and Plavix,  followup outpatient arterial Dopplers and then Dr. Allyson Sabal will see the patient.   Consults: None  Significant Diagnostic  Studies:  Labs at discharge sodium 140 potassium 3.6 chloride 105 CO2 25 BUN 16 creatinine 0.9 calcium 8.5 glucose 98  Hemoglobin 12.3 hematocrit 37 WBC 6.7 platelets  161  Discharge Exam: Blood pressure 136/67, pulse 72, temperature 98.3 F (36.8 C), temperature source Oral, resp. rate 17, height 5\' 6"  (1.676 m), weight 70 kg (154 lb 5.2 oz), SpO2 94.00%.   General:alert and oriented  Heart:S1S2 RRR  Lungs:clear - diffuse expiratory wheezes.; non-labored.  Abd:+ BS  Ext:no edema, cath site with small hematoma, mild bruising  Neuro:alert and oriented  Disposition: 01-Home or Self Care   Medication List  As of 08/23/2011  6:47 PM   TAKE these medications         albuterol 108 (90 BASE) MCG/ACT inhaler   Commonly known as: PROVENTIL HFA;VENTOLIN HFA   Inhale 2 puffs into the lungs every 6 (six) hours as needed.      amLODipine 5 MG tablet   Commonly known as: NORVASC   Take 5 mg by mouth every evening.      aspirin 81 MG tablet   Take 1 tablet (81 mg total) by mouth daily.      atorvastatin 20 MG tablet   Commonly known as: LIPITOR   Take 20 mg by mouth every morning.      clopidogrel 75 MG tablet   Commonly known as: PLAVIX   Take 75 mg by mouth every morning.      finasteride 5 MG tablet   Commonly known as: PROSCAR   Take 5 mg by mouth every morning.      nebivolol 10 MG tablet   Commonly known as: BYSTOLIC   Take 10 mg by mouth every morning.      olmesartan 40 MG tablet   Commonly known as: BENICAR   Take 40 mg by mouth every morning.      ranitidine 150 MG tablet   Commonly known as: ZANTAC   Take 150 mg by mouth every morning.      tiotropium 18 MCG inhalation capsule   Commonly known as: SPIRIVA   Place 18 mcg into inhaler and inhale daily.           Follow-up Information    Follow up with [name], MD on 09/13/2011. (for dopplers after stents.  at 3:00 pm)    Contact information:   [address] [place] [address] [phone]       Follow up with [name], MD on 09/20/2011. (at 1:45 pm)    Contact information:   [address] [place]  [place] [address] [phone]        Discharge instructions: Call [facility] if any bleeding, swelling or drainage at cath site.  May shower, no tub baths for 48 hours for groin sticks.   No lifting over 8 pounds for 1 week  No driving for 3 days  If lt. Groin swells or becomes painful call our office.  Heart Healthy diet  Signed: Palmina Clodfelter R 08/23/2011, 6:47 PM

## 2012-02-15 ENCOUNTER — Other Ambulatory Visit (HOSPITAL_COMMUNITY): Payer: Self-pay | Admitting: Cardiovascular Disease

## 2012-02-15 DIAGNOSIS — I739 Peripheral vascular disease, unspecified: Secondary | ICD-10-CM

## 2012-03-09 ENCOUNTER — Ambulatory Visit (HOSPITAL_COMMUNITY)
Admission: RE | Admit: 2012-03-09 | Discharge: 2012-03-09 | Disposition: A | Discharge status: Home / Self Care | Payer: Medicare Other | Source: Ambulatory Visit | Attending: Cardiovascular Disease | Admitting: Cardiovascular Disease

## 2012-03-09 DIAGNOSIS — I739 Peripheral vascular disease, unspecified: Secondary | ICD-10-CM | POA: Insufficient documentation

## 2012-03-09 NOTE — Progress Notes (Signed)
Bilateral Lower Ext. Duplex Doppler Completed. Douglas Monroe D  

## 2012-06-28 ENCOUNTER — Other Ambulatory Visit: Payer: Self-pay | Admitting: *Deleted

## 2012-06-28 MED ORDER — CLOPIDOGREL BISULFATE 75 MG PO TABS: 75.0000 mg | ORAL_TABLET | Freq: Every morning | ORAL | Status: DC

## 2012-07-11 ENCOUNTER — Ambulatory Visit: Payer: Medicare Other | Admitting: Cardiology

## 2012-07-13 ENCOUNTER — Ambulatory Visit: Payer: Medicare Other | Admitting: Cardiology

## 2012-07-13 ENCOUNTER — Encounter: Payer: Self-pay | Admitting: Cardiovascular Disease

## 2012-07-21 ENCOUNTER — Encounter: Payer: Self-pay | Admitting: Cardiology

## 2012-07-21 ENCOUNTER — Ambulatory Visit: Payer: Medicare Other | Admitting: Cardiology

## 2012-07-21 ENCOUNTER — Ambulatory Visit (INDEPENDENT_AMBULATORY_CARE_PROVIDER_SITE_OTHER): Payer: Medicare Other | Admitting: Cardiology

## 2012-07-21 VITALS — BP 130/72 | HR 56 | Ht 67.0 in | Wt 150.2 lb

## 2012-07-21 DIAGNOSIS — Z0389 Encounter for observation for other suspected diseases and conditions ruled out: Secondary | ICD-10-CM

## 2012-07-21 DIAGNOSIS — I10 Essential (primary) hypertension: Secondary | ICD-10-CM

## 2012-07-21 DIAGNOSIS — J449 Chronic obstructive pulmonary disease, unspecified: Secondary | ICD-10-CM

## 2012-07-21 DIAGNOSIS — I739 Peripheral vascular disease, unspecified: Secondary | ICD-10-CM

## 2012-07-21 DIAGNOSIS — IMO0001 Reserved for inherently not codable concepts without codable children: Secondary | ICD-10-CM | POA: Insufficient documentation

## 2012-07-21 NOTE — Assessment & Plan Note (Signed)
Quit smoking 5/12

## 2012-07-21 NOTE — Progress Notes (Signed)
07/21/2012 Douglas Monroe   [birth year]  [id number]  Primary Physicia Douglas Aliment, MD Primary Cardiologist: Dr Douglas Monroe  HPI:  The patient is a very pleasant 79 year old, thin-appearing, married Caucasian male, father of 2, grandfather to 5 grandchildren who I last saw in the office 5 months ago. 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 He stopped smoking 5/12 and has COPD, hypertension and hyperlipidemia. He had normal coronary arteries by cath performed by Dr. Jenne Campus in 2004 and normal LV function. Myoview was low risk 2012. Dr Douglas Monroe stented both of his iliacs in the past, as well as recanalized his right SFA. He did have an incidentally noted 80% left renal artery stenosis which we have been following by duplex ultrasound and has remained stable, as well as moderate left internal carotid artery stenosis. We follow this as well. He is neurologically asymptomatic.. Dr Douglas Monroe restented his left iliac ostium with an iCAST covered stent and then stented his entire left external iliac artery with an Absolute Pro Nitinol self-expanding stent (8 x 100 mm.)July 2013  His symptoms resolved and his Dopplers normalized. He no longer has claudication. He denies chest pain or shortness of breath. His most recent lipid profile performed in August revealed total cholesterol 112, LDL 62 and HDL 35.    He is in the office today for 6 month check up he has been doing well, no claudication, no angina. He is due for follow up dopplers in August and can see Dr Douglas Monroe in December.    Current Outpatient Prescriptions  Medication Sig Dispense Refill  . albuterol (PROVENTIL HFA;VENTOLIN HFA) 108 (90 BASE) MCG/ACT inhaler Inhale 2 puffs into the lungs every 6 (six) hours as needed.      [name] amLODipine (NORVASC) 5 MG tablet Take 5 mg by mouth every evening.      [name] aspirin 81 MG tablet Take 1 tablet (81 mg total) by mouth daily.  30 tablet    . atorvastatin (LIPITOR) 20 MG tablet  Take 20 mg by mouth every morning.      . clopidogrel (PLAVIX) 75 MG tablet Take 1 tablet (75 mg total) by mouth every morning.  30 tablet  6  . finasteride (PROSCAR) 5 MG tablet Take 5 mg by mouth every morning.      . nebivolol (BYSTOLIC) 10 MG tablet Take 10 mg by mouth every morning.      . olmesartan (BENICAR) 40 MG tablet Take 40 mg by mouth every morning.      . ranitidine (ZANTAC) 150 MG tablet Take 150 mg by mouth every morning.      . tiotropium (SPIRIVA) 18 MCG inhalation capsule Place 18 mcg into inhaler and inhale daily.       No current facility-administered medications for this visit.    No Known Allergies  History   Social History  . Marital Status: Married    Spouse Name: N/A    Number of Children: N/A  . Years of Education: N/A   Occupational History  . Not on file.   Social History Main Topics  . Smoking status: Former Smoker -- 1.00 packs/day for 60 years    Types: Cigarettes    Quit date: 06/02/2010  . Smokeless tobacco: Never Used  . Alcohol Use: No  . Drug Use: No  . Sexually Active: No   Other Topics Concern  . Not on file   Social History Narrative  . No narrative on file  Review of Systems: General: negative for chills, fever, night sweats or weight changes.  Cardiovascular: negative for chest pain, dyspnea on exertion, edema, orthopnea, palpitations, paroxysmal nocturnal dyspnea or shortness of breath Dermatological: negative for rash Respiratory: negative for cough or wheezing Urologic: negative for hematuria Abdominal: negative for nausea, vomiting, diarrhea, bright red blood per rectum, melena, or hematemesis Neurologic: negative for visual changes, syncope, or dizziness All other systems reviewed and are otherwise negative except as noted above.    Blood pressure 130/72, pulse 56, height 5\' 7"  (1.702 m), weight 150 lb 3.2 oz (68.13 kg).  General appearance: alert, cooperative and no distress Neck: no carotid bruit and no  JVD Lungs: decreased breath sounds Heart: regular rate and rhythm and decreased heart sounds Extremities: no edema, diminnished pulses bilat  EKG  EKG: normal EKG, normal sinus rhythm, unchanged from previous tracings.  ASSESSMENT AND PLAN:   PVD (peripheral vascular disease) with claudication, previous stents to bilateral iliacs. Rt. SFA also.  No claudication  COPD (chronic obstructive pulmonary disease) Quit smoking 5/12  HTN (hypertension) controlled  Normal coronary arteries- 2004, negative Myoview 2012 No angina   PLAN  Follow up dopplers as scheduled, see Dr Douglas Monroe in December.   [name] KPA-C 07/21/2012 4:10 PM

## 2012-07-21 NOTE — Assessment & Plan Note (Signed)
controlled 

## 2012-07-21 NOTE — Assessment & Plan Note (Signed)
No claudication.  ?

## 2012-07-21 NOTE — Patient Instructions (Signed)
Dr Allyson Sabal in December  Corine Shelter PA-C 07/21/2012 4:16 PM

## 2012-07-21 NOTE — Assessment & Plan Note (Signed)
No angina 

## 2012-07-28 ENCOUNTER — Telehealth: Payer: Self-pay | Admitting: Cardiovascular Disease

## 2012-07-28 NOTE — Telephone Encounter (Signed)
Patient saw Douglas Monroe last Friday and lower extremity doppler was ordered was August.  He woke up this am and his R leg is hurting no swelling or redness.  Do we need to move appt up for doppler?

## 2012-07-28 NOTE — Telephone Encounter (Signed)
Returned call and spoke w/ Doristine Counter, pt's wife.  Stated pt woke up this morning w/ R leg pain.  Stated he is supposed have a sonongram in August, but she doesn't know if he needs to have it done sooner.  Pt on phone.  C/o pain above knee and aches.  Rated pain 3/10 on pain scale.  Denied redness, warmness, swelling in leg.  Denied CP, SOB.  Pt stated the pain started yesterday evening.  Stated he took a [place] powder this morning and now the pain is coming and going.  Pt informed symptoms do not sound like blood clot and Dr. Allyson Sabal will be notified for further instructions in regards to scheduling doppler earlier.  Pt verbalized understanding and agreed w/ plan.  Message forwarded to Dr. Allyson Sabal for review and further instructions.  Chart# [id number] on Dr. Hazle Coca cart for review.  Last OV note in Epic.

## 2012-07-28 NOTE — Telephone Encounter (Signed)
There is no indication given his recent normal Dopplers to repeat them centered in August. If his pain persists, he gets his primary care physician.

## 2012-07-31 ENCOUNTER — Other Ambulatory Visit: Payer: Self-pay | Admitting: Cardiovascular Disease

## 2012-07-31 NOTE — Telephone Encounter (Signed)
Rx was sent to pharmacy electronically. 

## 2012-07-31 NOTE — Telephone Encounter (Signed)
Pt no longer having leg pain.  I advised patient of Dr Hazle Coca recommendation and to call if occurs again.

## 2012-08-23 ENCOUNTER — Other Ambulatory Visit (HOSPITAL_COMMUNITY): Payer: Self-pay | Admitting: Cardiovascular Disease

## 2012-08-23 DIAGNOSIS — I739 Peripheral vascular disease, unspecified: Secondary | ICD-10-CM

## 2012-08-28 ENCOUNTER — Other Ambulatory Visit (HOSPITAL_COMMUNITY): Payer: Self-pay | Admitting: Cardiovascular Disease

## 2012-08-28 DIAGNOSIS — I701 Atherosclerosis of renal artery: Secondary | ICD-10-CM

## 2012-09-05 ENCOUNTER — Ambulatory Visit (HOSPITAL_COMMUNITY)
Admission: RE | Admit: 2012-09-05 | Discharge: 2012-09-05 | Disposition: A | Discharge status: Home / Self Care | Payer: Medicare Other | Source: Ambulatory Visit | Attending: Cardiovascular Disease | Admitting: Cardiovascular Disease

## 2012-09-05 DIAGNOSIS — I739 Peripheral vascular disease, unspecified: Secondary | ICD-10-CM | POA: Insufficient documentation

## 2012-09-05 NOTE — Progress Notes (Signed)
Lower Ext. Arterial Duplex Completed. Malayiah Mcbrayer, BS, RDMS, RVT  

## 2012-09-10 ENCOUNTER — Encounter: Payer: Self-pay | Admitting: *Deleted

## 2012-09-10 ENCOUNTER — Telehealth: Payer: Self-pay | Admitting: *Deleted

## 2012-09-10 DIAGNOSIS — I739 Peripheral vascular disease, unspecified: Secondary | ICD-10-CM

## 2012-09-10 NOTE — Telephone Encounter (Signed)
Message copied by Marella Bile on Sun Sep 10, 2012  8:52 PM ------      Message from: [name]      Created: Sat Sep 09, 2012  8:23 AM       No change from prior study. Repeat in 12 months. ------

## 2012-09-12 ENCOUNTER — Ambulatory Visit (HOSPITAL_COMMUNITY)
Admission: RE | Admit: 2012-09-12 | Discharge: 2012-09-12 | Disposition: A | Discharge status: Home / Self Care | Payer: Medicare Other | Source: Ambulatory Visit | Attending: Cardiovascular Disease | Admitting: Cardiovascular Disease

## 2012-09-12 DIAGNOSIS — I701 Atherosclerosis of renal artery: Secondary | ICD-10-CM | POA: Insufficient documentation

## 2012-09-12 NOTE — Progress Notes (Signed)
Renal Artery Duplex Completed. °Douglas Monroe ° °

## 2012-09-22 ENCOUNTER — Other Ambulatory Visit: Payer: Self-pay | Admitting: *Deleted

## 2012-09-22 DIAGNOSIS — I10 Essential (primary) hypertension: Secondary | ICD-10-CM

## 2012-09-26 ENCOUNTER — Telehealth (HOSPITAL_COMMUNITY): Payer: Self-pay | Admitting: Cardiovascular Disease

## 2012-09-29 ENCOUNTER — Ambulatory Visit (HOSPITAL_COMMUNITY)
Admission: RE | Admit: 2012-09-29 | Discharge: 2012-09-29 | Disposition: A | Discharge status: Home / Self Care | Payer: Medicare Other | Source: Ambulatory Visit | Attending: Cardiovascular Disease | Admitting: Cardiovascular Disease

## 2012-09-29 DIAGNOSIS — I10 Essential (primary) hypertension: Secondary | ICD-10-CM

## 2012-09-29 DIAGNOSIS — I6529 Occlusion and stenosis of unspecified carotid artery: Secondary | ICD-10-CM | POA: Insufficient documentation

## 2012-09-29 NOTE — Progress Notes (Signed)
Carotid Artery Duplex Completed. Brianna L Mazza,RVT

## 2012-10-06 ENCOUNTER — Ambulatory Visit (INDEPENDENT_AMBULATORY_CARE_PROVIDER_SITE_OTHER): Payer: Medicare Other | Admitting: Cardiovascular Disease

## 2012-10-06 ENCOUNTER — Encounter: Payer: Self-pay | Admitting: Cardiovascular Disease

## 2012-10-06 VITALS — BP 138/82 | HR 64 | Ht 67.0 in | Wt 151.0 lb

## 2012-10-06 DIAGNOSIS — R5383 Other fatigue: Secondary | ICD-10-CM

## 2012-10-06 DIAGNOSIS — I714 Abdominal aortic aneurysm, without rupture, unspecified: Secondary | ICD-10-CM

## 2012-10-06 DIAGNOSIS — I779 Disorder of arteries and arterioles, unspecified: Secondary | ICD-10-CM

## 2012-10-06 DIAGNOSIS — I701 Atherosclerosis of renal artery: Secondary | ICD-10-CM

## 2012-10-06 DIAGNOSIS — D689 Coagulation defect, unspecified: Secondary | ICD-10-CM

## 2012-10-06 DIAGNOSIS — Z01818 Encounter for other preprocedural examination: Secondary | ICD-10-CM

## 2012-10-06 DIAGNOSIS — F17200 Nicotine dependence, unspecified, uncomplicated: Secondary | ICD-10-CM

## 2012-10-06 DIAGNOSIS — Z720 Tobacco use: Secondary | ICD-10-CM

## 2012-10-06 DIAGNOSIS — R5381 Other malaise: Secondary | ICD-10-CM

## 2012-10-06 DIAGNOSIS — I739 Peripheral vascular disease, unspecified: Secondary | ICD-10-CM

## 2012-10-06 DIAGNOSIS — Z79899 Other long term (current) drug therapy: Secondary | ICD-10-CM

## 2012-10-06 NOTE — Assessment & Plan Note (Signed)
Status post bilateral iliac artery PTA and stenting. His Doppler studies were performed last month show widely patent stents. He denies claudication.

## 2012-10-06 NOTE — Progress Notes (Signed)
10/06/2012 Douglas Monroe   [birth year]  [id number]  Primary Physician Douglas Aliment, MD Primary Cardiologist: Douglas Gess MD Douglas Monroe   HPI:  The patient is a very pleasant 79 year old, thin-appearing, married Caucasian male, father of 2, grandfather to 5 grandchildren who I last saw in the office 5 months ago. 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 He stopped smoking over a year ago and has COPD, hypertension and hyperlipidemia. He had normal coronary arteries by cath performed by Dr. Jenne Monroe in 2004 and normal LV function. I stented both of his iliacs in the past, as well as recanalized his right SFA. He did have an incidentally noted 80% left renal artery stenosis which we have been following by duplex ultrasound and has remained stable, as well as moderate left internal carotid artery stenosis. We follow this as well. He is neurologically asymptomatic. He did have recurrent claudication with a drop in his left ABI and reangiogram which revealed in-stent restenosis with progression of disease. I restented his left iliac ostium with an iCAST covered stent and then stented his entire left external iliac artery with an Absolute Pro Nitinol self-expanding stent (8 x 100 mm.) His symptoms resolved and his Dopplers normalized. He no longer has claudication. He denies chest pain or shortness of breath. His most recent lipid profile performed in August revealed total cholesterol 112, LDL 62 and HDL 35. Since I saw him in the office 02/16/12 he has remained stable. He denies chest pain, shortness of breath or claudication. This will Dopplers performed 09/12/12 didn't show a progression of the left renal artery stenosis. Based on this we'll decide to proceed with percutaneous intervention for renal preservation.   Current Outpatient Prescriptions  Medication Sig Dispense Refill  . albuterol (PROVENTIL HFA;VENTOLIN HFA) 108 (90 BASE)  MCG/ACT inhaler Inhale 2 puffs into the lungs every 6 (six) hours as needed.      [name] amLODipine (NORVASC) 5 MG tablet Take 5 mg by mouth every evening.      [name] atorvastatin (LIPITOR) 20 MG tablet TAKE 1 TABLET BY MOUTH EVERY DAY  30 tablet  7  . clopidogrel (PLAVIX) 75 MG tablet Take 1 tablet (75 mg total) by mouth every morning.  30 tablet  6  . ergocalciferol (VITAMIN D2) 50000 UNITS capsule Take 50,000 Units by mouth. One tablet Tues and Fridays      . finasteride (PROSCAR) 5 MG tablet Take 5 mg by mouth every morning.      . nebivolol (BYSTOLIC) 10 MG tablet Take 10 mg by mouth every morning.      . olmesartan (BENICAR) 40 MG tablet Take 40 mg by mouth every morning.      . ranitidine (ZANTAC) 150 MG tablet Take 150 mg by mouth every morning.      . tiotropium (SPIRIVA) 18 MCG inhalation capsule Place 18 mcg into inhaler and inhale daily.       No current facility-administered medications for this visit.    No Known Allergies  History   Social History  . Marital Status: Married    Spouse Name: N/A    Number of Children: N/A  . Years of Education: N/A   Occupational History  . Not on file.   Social History Main Topics  . Smoking status: Former Smoker -- 1.00 packs/day for 60 years    Types: Cigarettes    Quit date: 06/02/2010  . Smokeless tobacco: Never Used  . Alcohol Use:  No  . Drug Use: No  . Sexual Activity: No   Other Topics Concern  . Not on file   Social History Narrative  . No narrative on file     Review of Systems: General: negative for chills, fever, night sweats or weight changes.  Cardiovascular: negative for chest pain, dyspnea on exertion, edema, orthopnea, palpitations, paroxysmal nocturnal dyspnea or shortness of breath Dermatological: negative for rash Respiratory: negative for cough or wheezing Urologic: negative for hematuria Abdominal: negative for nausea, vomiting, diarrhea, bright red blood per rectum, melena, or hematemesis Neurologic:  negative for visual changes, syncope, or dizziness All other systems reviewed and are otherwise negative except as noted above.    Blood pressure 138/82, pulse 64, height 5\' 7"  (1.702 m), weight 151 lb (68.493 kg).  General appearance: alert and no distress Neck: no adenopathy, no carotid bruit, no JVD, supple, symmetrical, trachea midline and thyroid not enlarged, symmetric, no tenderness/mass/nodules Lungs: clear to auscultation bilaterally Heart: regular rate and rhythm, S1, S2 normal, no murmur, click, rub or gallop Extremities: extremities normal, atraumatic, no cyanosis or edema  EKG not performed today  ASSESSMENT AND PLAN:   Abdominal aortic aneurysm Measures 4.1 x 4.0 by recent lower extremity ultrasound. Will continue to follow on an annual basis.  Claudication of left lower extremity, with known stent in iliac Status post bilateral iliac artery PTA and stenting. His Doppler studies were performed last month show widely patent stents. He denies claudication.  Renal artery stenosis Endocrine performed 08/19/11 revealed a 70% left renal artery stenosis. Recent renal Dopplers performed 09/12/12 suggests significant progression of disease with a decrease in his left renal pole-to-pole the dimension from 10.5-9.9 cm. Based on this I am concerned that he may lose his left kidney secondary to progressive renal artery stenosis. We have decided to proceed with left renal artery PTA and stenting for renal preservation. His blood pressure is under good control on his current medications and his renal function is fairly normal.      Douglas Gess MD [facility], [facility] 10/06/2012 3:53 PM

## 2012-10-06 NOTE — Patient Instructions (Signed)
Dr. Allyson Sabal has ordered a peripheral angiogram (left renal stent) to be done at [facility].  This procedure is going to look at the bloodflow in your lower extremities.  If Dr. Allyson Sabal is able to open up the arteries, you will have to spend one night in the hospital.  If he is not able to open the arteries, you will be able to go home that same day.    After the procedure, you will not be allowed to drive for 3 days or push, pull, or lift anything greater than 10 lbs for one week.    You will be required to have bloodwork and a chest xray prior to your procedure.  Our scheduler will advise you on when these items need to be done.

## 2012-10-06 NOTE — Assessment & Plan Note (Signed)
Measures 4.1 x 4.0 by recent lower extremity ultrasound. Will continue to follow on an annual basis.

## 2012-10-06 NOTE — Assessment & Plan Note (Signed)
Endocrine performed 08/19/11 revealed a 70% left renal artery stenosis. Recent renal Dopplers performed 09/12/12 suggests significant progression of disease with a decrease in his left renal pole-to-pole the dimension from 10.5-9.9 cm. Based on this I am concerned that he may lose his left kidney secondary to progressive renal artery stenosis. We have decided to proceed with left renal artery PTA and stenting for renal preservation. His blood pressure is under good control on his current medications and his renal function is fairly normal.

## 2012-10-09 ENCOUNTER — Encounter: Payer: Self-pay | Admitting: *Deleted

## 2012-10-09 ENCOUNTER — Encounter: Payer: Self-pay | Admitting: Cardiovascular Disease

## 2012-10-09 ENCOUNTER — Telehealth: Payer: Self-pay | Admitting: *Deleted

## 2012-10-09 DIAGNOSIS — I6529 Occlusion and stenosis of unspecified carotid artery: Secondary | ICD-10-CM

## 2012-10-09 NOTE — Telephone Encounter (Signed)
Message copied by Marella Bile on Mon Oct 09, 2012 10:41 PM ------      Message from: [name]      Created: Fri Oct 06, 2012  8:28 AM       Mildly abn. Repeat 12 months ------

## 2012-10-09 NOTE — Telephone Encounter (Signed)
Order placed for repeat carotid doppler in 1 year 

## 2012-10-26 ENCOUNTER — Telehealth: Payer: Self-pay | Admitting: Cardiovascular Disease

## 2012-10-26 ENCOUNTER — Ambulatory Visit
Admission: RE | Admit: 2012-10-26 | Discharge: 2012-10-26 | Disposition: A | Discharge status: Home / Self Care | Payer: Medicare Other | Source: Ambulatory Visit | Attending: Cardiovascular Disease | Admitting: Cardiovascular Disease

## 2012-10-26 DIAGNOSIS — Z720 Tobacco use: Secondary | ICD-10-CM

## 2012-10-26 LAB — PROTIME-INR
INR: 0.89 (ref <1.50)
Prothrombin Time: 12.1 seconds (ref 11.6–15.2)

## 2012-10-26 LAB — CBC
HCT: 41.5 % (ref 39.0–52.0)
MCHC: 34 g/dL (ref 30.0–36.0)
MCV: 92.6 fL (ref 78.0–100.0)
Platelets: 215 10*3/uL (ref 150–400)
RDW: 14.6 % (ref 11.5–15.5)
WBC: 6.7 10*3/uL (ref 4.0–10.5)

## 2012-10-26 LAB — BASIC METABOLIC PANEL
BUN: 23 mg/dL (ref 6–23)
CO2: 26 mEq/L (ref 19–32)
Calcium: 9.7 mg/dL (ref 8.4–10.5)
Glucose, Bld: 92 mg/dL (ref 70–99)

## 2012-10-26 NOTE — Telephone Encounter (Signed)
Returned call to [place], pt's wife.  Informed orders in computer and just needs to show up to have completed.  Verbalized understanding and agreed w/ plan.

## 2012-10-26 NOTE — Telephone Encounter (Signed)
Lost his order for his blood work and chest xray,would you please fax right away [facility]-. He is on his way now.

## 2012-10-30 ENCOUNTER — Encounter (HOSPITAL_COMMUNITY): Payer: Self-pay

## 2012-10-30 ENCOUNTER — Encounter: Payer: Self-pay | Admitting: *Deleted

## 2012-11-02 ENCOUNTER — Ambulatory Visit (HOSPITAL_COMMUNITY)
Admission: RE | Admit: 2012-11-02 | Discharge: 2012-11-03 | Disposition: A | Discharge status: Home / Self Care | Payer: Medicare Other | Source: Ambulatory Visit | Attending: Cardiovascular Disease | Admitting: Cardiovascular Disease

## 2012-11-02 ENCOUNTER — Encounter (HOSPITAL_COMMUNITY)
Admission: RE | Disposition: A | Discharge status: Home / Self Care | Payer: Self-pay | Source: Ambulatory Visit | Attending: Cardiovascular Disease

## 2012-11-02 ENCOUNTER — Encounter (HOSPITAL_COMMUNITY): Payer: Self-pay | Admitting: General Practice

## 2012-11-02 DIAGNOSIS — Z95820 Peripheral vascular angioplasty status with implants and grafts: Secondary | ICD-10-CM

## 2012-11-02 DIAGNOSIS — J4489 Other specified chronic obstructive pulmonary disease: Secondary | ICD-10-CM | POA: Insufficient documentation

## 2012-11-02 DIAGNOSIS — I739 Peripheral vascular disease, unspecified: Secondary | ICD-10-CM | POA: Diagnosis present

## 2012-11-02 DIAGNOSIS — E785 Hyperlipidemia, unspecified: Secondary | ICD-10-CM | POA: Insufficient documentation

## 2012-11-02 DIAGNOSIS — I714 Abdominal aortic aneurysm, without rupture, unspecified: Secondary | ICD-10-CM | POA: Insufficient documentation

## 2012-11-02 DIAGNOSIS — I701 Atherosclerosis of renal artery: Secondary | ICD-10-CM | POA: Diagnosis present

## 2012-11-02 DIAGNOSIS — I70219 Atherosclerosis of native arteries of extremities with intermittent claudication, unspecified extremity: Secondary | ICD-10-CM

## 2012-11-02 DIAGNOSIS — Z01818 Encounter for other preprocedural examination: Secondary | ICD-10-CM

## 2012-11-02 DIAGNOSIS — I10 Essential (primary) hypertension: Secondary | ICD-10-CM | POA: Diagnosis present

## 2012-11-02 DIAGNOSIS — IMO0001 Reserved for inherently not codable concepts without codable children: Secondary | ICD-10-CM

## 2012-11-02 DIAGNOSIS — I6529 Occlusion and stenosis of unspecified carotid artery: Secondary | ICD-10-CM | POA: Insufficient documentation

## 2012-11-02 DIAGNOSIS — J449 Chronic obstructive pulmonary disease, unspecified: Secondary | ICD-10-CM | POA: Diagnosis present

## 2012-11-02 DIAGNOSIS — Z959 Presence of cardiac and vascular implant and graft, unspecified: Secondary | ICD-10-CM

## 2012-11-02 HISTORY — PX: RENAL ANGIOGRAM: SHX5509

## 2012-11-02 HISTORY — PX: RENAL ARTERY STENT: SHX2321

## 2012-11-02 HISTORY — DX: Unspecified osteoarthritis, unspecified site: M19.90

## 2012-11-02 LAB — POCT ACTIVATED CLOTTING TIME: Activated Clotting Time: 165 seconds

## 2012-11-02 SURGERY — RENAL ANGIOGRAM
Anesthesia: LOCAL

## 2012-11-02 MED ORDER — DIAZEPAM 5 MG PO TABS
5.0000 mg | ORAL_TABLET | ORAL | Status: AC
  Administered 2012-11-02: 5 mg via ORAL

## 2012-11-02 MED ORDER — ALBUTEROL SULFATE HFA 108 (90 BASE) MCG/ACT IN AERS: 2.0000 | INHALATION_SPRAY | Freq: Four times a day (QID) | RESPIRATORY_TRACT | Status: DC | PRN

## 2012-11-02 MED ORDER — IRBESARTAN 150 MG PO TABS
150.0000 mg | ORAL_TABLET | Freq: Every day | ORAL | Status: DC
  Administered 2012-11-03: 150 mg via ORAL
  Filled 2012-11-02: qty 1

## 2012-11-02 MED ORDER — SODIUM CHLORIDE 0.9 % IJ SOLN: 3.0000 mL | INTRAMUSCULAR | Status: DC | PRN

## 2012-11-02 MED ORDER — SODIUM CHLORIDE 0.9 % IV SOLN: INTRAVENOUS | Status: AC

## 2012-11-02 MED ORDER — ATORVASTATIN CALCIUM 20 MG PO TABS
20.0000 mg | ORAL_TABLET | Freq: Every day | ORAL | Status: DC
  Administered 2012-11-03: 09:00:00 20 mg via ORAL
  Filled 2012-11-02: qty 1

## 2012-11-02 MED ORDER — ACETAMINOPHEN 325 MG PO TABS: 650.0000 mg | ORAL_TABLET | ORAL | Status: DC | PRN

## 2012-11-02 MED ORDER — HEPARIN (PORCINE) IN NACL 2-0.9 UNIT/ML-% IJ SOLN
INTRAMUSCULAR | Status: AC
  Filled 2012-11-02: qty 500

## 2012-11-02 MED ORDER — DIAZEPAM 5 MG PO TABS
ORAL_TABLET | ORAL | Status: AC
  Filled 2012-11-02: qty 1

## 2012-11-02 MED ORDER — HEPARIN SODIUM (PORCINE) 1000 UNIT/ML IJ SOLN
INTRAMUSCULAR | Status: AC
  Filled 2012-11-02: qty 1

## 2012-11-02 MED ORDER — TIOTROPIUM BROMIDE MONOHYDRATE 18 MCG IN CAPS
18.0000 ug | ORAL_CAPSULE | Freq: Every day | RESPIRATORY_TRACT | Status: DC
  Administered 2012-11-03: 08:00:00 18 ug via RESPIRATORY_TRACT
  Filled 2012-11-02: qty 5

## 2012-11-02 MED ORDER — SODIUM CHLORIDE 0.9 % IV SOLN
INTRAVENOUS | Status: DC
  Administered 2012-11-02: 07:00:00 via INTRAVENOUS

## 2012-11-02 MED ORDER — ONDANSETRON HCL 4 MG/2ML IJ SOLN: 4.0000 mg | Freq: Four times a day (QID) | INTRAMUSCULAR | Status: DC | PRN

## 2012-11-02 MED ORDER — ASPIRIN 81 MG PO CHEW: 324.0000 mg | CHEWABLE_TABLET | ORAL | Status: DC

## 2012-11-02 MED ORDER — MORPHINE SULFATE 2 MG/ML IJ SOLN: 2.0000 mg | INTRAMUSCULAR | Status: DC | PRN

## 2012-11-02 MED ORDER — CLOPIDOGREL BISULFATE 75 MG PO TABS
75.0000 mg | ORAL_TABLET | Freq: Every day | ORAL | Status: DC
  Administered 2012-11-03: 09:00:00 75 mg via ORAL
  Filled 2012-11-02: qty 1

## 2012-11-02 MED ORDER — AMLODIPINE BESYLATE 5 MG PO TABS
5.0000 mg | ORAL_TABLET | Freq: Every evening | ORAL | Status: DC
  Administered 2012-11-02: 5 mg via ORAL
  Filled 2012-11-02 (×2): qty 1

## 2012-11-02 MED ORDER — NEBIVOLOL HCL 10 MG PO TABS
10.0000 mg | ORAL_TABLET | Freq: Every morning | ORAL | Status: DC
  Administered 2012-11-03: 09:00:00 10 mg via ORAL
  Filled 2012-11-02: qty 1

## 2012-11-02 MED ORDER — HEPARIN (PORCINE) IN NACL 2-0.9 UNIT/ML-% IJ SOLN
INTRAMUSCULAR | Status: AC
  Filled 2012-11-02: qty 1000

## 2012-11-02 MED ORDER — LIDOCAINE HCL (PF) 1 % IJ SOLN
INTRAMUSCULAR | Status: AC
  Filled 2012-11-02: qty 30

## 2012-11-02 MED ORDER — ASPIRIN 81 MG PO CHEW
CHEWABLE_TABLET | ORAL | Status: AC
  Administered 2012-11-02: 324 mg
  Filled 2012-11-02: qty 4

## 2012-11-02 MED ORDER — FAMOTIDINE 20 MG PO TABS
20.0000 mg | ORAL_TABLET | Freq: Every day | ORAL | Status: DC
  Administered 2012-11-02 – 2012-11-03 (×2): 20 mg via ORAL
  Filled 2012-11-02 (×2): qty 1

## 2012-11-02 MED ORDER — FINASTERIDE 5 MG PO TABS
5.0000 mg | ORAL_TABLET | Freq: Every morning | ORAL | Status: DC
  Administered 2012-11-03: 09:00:00 5 mg via ORAL
  Filled 2012-11-02: qty 1

## 2012-11-02 NOTE — Interval H&P Note (Signed)
History and Physical Interval Note:  11/02/2012 8:03 AM  Douglas Monroe  has presented today for surgery, with the diagnosis of Claudication  The various methods of treatment have been discussed with the patient and family. After consideration of risks, benefits and other options for treatment, the patient has consented to  Procedure(s): RENAL ANGIOGRAM (N/A) as a surgical intervention .  The patient's history has been reviewed, patient examined, no change in status, stable for surgery.  I have reviewed the patient's chart and labs.  Questions were answered to the patient's satisfaction.     [name]

## 2012-11-02 NOTE — CV Procedure (Signed)
Douglas Monroe is a 79 y.o. male    [id number] LOCATION:  FACILITY: [facility]  PHYSICIAN: Nanetta Batty, M.D. 04-29-1932   DATE OF PROCEDURE:  11/02/2012  DATE OF DISCHARGE:     PV Intervention    History obtained from chart review.The patient is a very pleasant 79 year old, thin-appearing, married Caucasian male, father of 2, grandfather to 5 grandchildren who I last saw in the office 5 months ago. 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 He stopped smoking over a year ago and has COPD, hypertension and hyperlipidemia. He had normal coronary arteries by cath performed by Dr. Jenne Campus in 2004 and normal LV function. I stented both of his iliacs in the past, as well as recanalized his right SFA. He did have an incidentally noted 80% left renal artery stenosis which we have been following by duplex ultrasound and has remained stable, as well as moderate left internal carotid artery stenosis. We follow this as well. He is neurologically asymptomatic. He did have recurrent claudication with a drop in his left ABI and reangiogram which revealed in-stent restenosis with progression of disease. I restented his left iliac ostium with an iCAST covered stent and then stented his entire left external iliac artery with an Absolute Pro Nitinol self-expanding stent (8 x 100 mm.) His symptoms resolved and his Dopplers normalized. He no longer has claudication. He denies chest pain or shortness of breath. His most recent lipid profile performed in August revealed total cholesterol 112, LDL 62 and HDL 35.  Since I saw him in the office 02/16/12 he has remained stable. He denies chest pain, shortness of breath or claudication. This will Dopplers performed 09/12/12 didn't show a progression of the left renal artery stenosis. Based on this we'll decide to proceed with percutaneous intervention for renal preservation.    PROCEDURE DESCRIPTION:    The patient was brought  to the second floor [facility] cath lab in the postabsorptive state. He was premedicated with Valium 5 mg by mouth. His right groinwas prepped and shaved in usual sterile fashion. Xylocaine 1% was used  for local anesthesia. A 6 French sheath was inserted into the right common femoral artery using standard Seldinger technique. The patient received 4000 units  of heparin  intravenously.  A 5 French pigtail catheter catheter was used for midstream abdominal aortography, distal abdominal aortography and bilateral iliac artery angiography. Visipaque was used for the entirety of the case (75 cc administered the patient). Retrograde dual aorta pressures monitored during the case    HEMODYNAMICS:    AO SYSTOLIC/AO DIASTOLIC: 140/73   Left renal artery pressure: 65/52  ANGIOGRAPHIC RESULTS:   1: Abdominal aortogram-80% ostial left renal artery stenosis, widely patent right renal artery. There was a saccular aneurysm in the distal abdominal aorta above the iliac bifurcation. The stents in the right and left common and external iliac arteries were widely patent.  IMPRESSION:80% ostial/proximal left renal artery stenosis with a 75 mm gradient. The iliac stents are widely patent. We'll proceed with PTA and stenting.  Procedure description: Using a short 6 [place] JR 4 guide catheter along with an 014 stabilizer wire and a 3 mm x 2 cm balloon predilatation was performed. Following the stenting was performed with a 6 mm x 15 mm long Palmaz blue balloon stent premount deployed at 9 atmospheres. The final angiographic result with reduction of an 80% ostial/proximal left renal artery stenosis to 0% residual. The patient tolerated the procedure well.  He is already on aspirin and Plavix.  Final impression: Successful left renal artery PTA and stenting for renal preservation in the setting of progressive left renal artery stenosis and decline in renal dimension. The sheath was removed and pressure was held on  the groin to achieve hemostasis. The patient left the lab in stable condition. He will be hydrated overnight, and discharged on the morning. He will obtain outpatient renal Doppler studies in our office and see me back subsequent to that.  [name] MD, [facility] 11/02/2012 8:58 AM

## 2012-11-02 NOTE — H&P (View-Only) (Signed)
10/06/2012 Douglas Monroe   1933-01-04  [id number]  Primary Physician Gwynneth Aliment, MD Primary Cardiologist: [name] MD [name]   HPI:  The patient is a very pleasant 79 year old, thin-appearing, married Caucasian male, father of 2, grandfather to 5 grandchildren who I last saw in the office 5 months ago. 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 He stopped smoking over a year ago and has COPD, hypertension and hyperlipidemia. He had normal coronary arteries by cath performed by Dr. Jenne Campus in 2004 and normal LV function. I stented both of his iliacs in the past, as well as recanalized his right SFA. He did have an incidentally noted 80% left renal artery stenosis which we have been following by duplex ultrasound and has remained stable, as well as moderate left internal carotid artery stenosis. We follow this as well. He is neurologically asymptomatic. He did have recurrent claudication with a drop in his left ABI and reangiogram which revealed in-stent restenosis with progression of disease. I restented his left iliac ostium with an iCAST covered stent and then stented his entire left external iliac artery with an Absolute Pro Nitinol self-expanding stent (8 x 100 mm.) His symptoms resolved and his Dopplers normalized. He no longer has claudication. He denies chest pain or shortness of breath. His most recent lipid profile performed in August revealed total cholesterol 112, LDL 62 and HDL 35. Since I saw him in the office 02/16/12 he has remained stable. He denies chest pain, shortness of breath or claudication. This will Dopplers performed 09/12/12 didn't show a progression of the left renal artery stenosis. Based on this we'll decide to proceed with percutaneous intervention for renal preservation.   Current Outpatient Prescriptions  Medication Sig Dispense Refill  . albuterol (PROVENTIL HFA;VENTOLIN HFA) 108 (90 BASE)  MCG/ACT inhaler Inhale 2 puffs into the lungs every 6 (six) hours as needed.      [name] amLODipine (NORVASC) 5 MG tablet Take 5 mg by mouth every evening.      [name] atorvastatin (LIPITOR) 20 MG tablet TAKE 1 TABLET BY MOUTH EVERY DAY  30 tablet  7  . clopidogrel (PLAVIX) 75 MG tablet Take 1 tablet (75 mg total) by mouth every morning.  30 tablet  6  . ergocalciferol (VITAMIN D2) 50000 UNITS capsule Take 50,000 Units by mouth. One tablet Tues and Fridays      . finasteride (PROSCAR) 5 MG tablet Take 5 mg by mouth every morning.      . nebivolol (BYSTOLIC) 10 MG tablet Take 10 mg by mouth every morning.      . olmesartan (BENICAR) 40 MG tablet Take 40 mg by mouth every morning.      . ranitidine (ZANTAC) 150 MG tablet Take 150 mg by mouth every morning.      . tiotropium (SPIRIVA) 18 MCG inhalation capsule Place 18 mcg into inhaler and inhale daily.       No current facility-administered medications for this visit.    No Known Allergies  History   Social History  . Marital Status: Married    Spouse Name: N/A    Number of Children: N/A  . Years of Education: N/A   Occupational History  . Not on file.   Social History Main Topics  . Smoking status: Former Smoker -- 1.00 packs/day for 60 years    Types: Cigarettes    Quit date: 06/02/2010  . Smokeless tobacco: Never Used  . Alcohol Use:  No  . Drug Use: No  . Sexual Activity: No   Other Topics Concern  . Not on file   Social History Narrative  . No narrative on file     Review of Systems: General: negative for chills, fever, night sweats or weight changes.  Cardiovascular: negative for chest pain, dyspnea on exertion, edema, orthopnea, palpitations, paroxysmal nocturnal dyspnea or shortness of breath Dermatological: negative for rash Respiratory: negative for cough or wheezing Urologic: negative for hematuria Abdominal: negative for nausea, vomiting, diarrhea, bright red blood per rectum, melena, or hematemesis Neurologic:  negative for visual changes, syncope, or dizziness All other systems reviewed and are otherwise negative except as noted above.    Blood pressure 138/82, pulse 64, height 5\' 7"  (1.702 m), weight 151 lb (68.493 kg).  General appearance: alert and no distress Neck: no adenopathy, no carotid bruit, no JVD, supple, symmetrical, trachea midline and thyroid not enlarged, symmetric, no tenderness/mass/nodules Lungs: clear to auscultation bilaterally Heart: regular rate and rhythm, S1, S2 normal, no murmur, click, rub or gallop Extremities: extremities normal, atraumatic, no cyanosis or edema  EKG not performed today  ASSESSMENT AND PLAN:   Abdominal aortic aneurysm Measures 4.1 x 4.0 by recent lower extremity ultrasound. Will continue to follow on an annual basis.  Claudication of left lower extremity, with known stent in iliac Status post bilateral iliac artery PTA and stenting. His Doppler studies were performed last month show widely patent stents. He denies claudication.  Renal artery stenosis Endocrine performed 08/19/11 revealed a 70% left renal artery stenosis. Recent renal Dopplers performed 09/12/12 suggests significant progression of disease with a decrease in his left renal pole-to-pole the dimension from 10.5-9.9 cm. Based on this I am concerned that he may lose his left kidney secondary to progressive renal artery stenosis. We have decided to proceed with left renal artery PTA and stenting for renal preservation. His blood pressure is under good control on his current medications and his renal function is fairly normal.      [name] MD [facility], [facility] 10/06/2012 3:53 PM

## 2012-11-03 ENCOUNTER — Other Ambulatory Visit: Payer: Self-pay | Admitting: Cardiology

## 2012-11-03 ENCOUNTER — Encounter (HOSPITAL_COMMUNITY): Payer: Self-pay | Admitting: *Deleted

## 2012-11-03 ENCOUNTER — Encounter (HOSPITAL_COMMUNITY): Payer: Self-pay | Admitting: Cardiology

## 2012-11-03 DIAGNOSIS — Z9889 Other specified postprocedural states: Secondary | ICD-10-CM

## 2012-11-03 DIAGNOSIS — Z01818 Encounter for other preprocedural examination: Secondary | ICD-10-CM

## 2012-11-03 DIAGNOSIS — Z959 Presence of cardiac and vascular implant and graft, unspecified: Secondary | ICD-10-CM

## 2012-11-03 DIAGNOSIS — I701 Atherosclerosis of renal artery: Secondary | ICD-10-CM

## 2012-11-03 LAB — CBC
HCT: 37.5 % — ABNORMAL LOW (ref 39.0–52.0)
Hemoglobin: 12.6 g/dL — ABNORMAL LOW (ref 13.0–17.0)
MCH: 31 pg (ref 26.0–34.0)
MCHC: 33.6 g/dL (ref 30.0–36.0)
MCV: 92.4 fL (ref 78.0–100.0)
Platelets: 194 10*3/uL (ref 150–400)
RBC: 4.06 MIL/uL — ABNORMAL LOW (ref 4.22–5.81)
RDW: 14.1 % (ref 11.5–15.5)

## 2012-11-03 LAB — BASIC METABOLIC PANEL
BUN: 20 mg/dL (ref 6–23)
CO2: 24 mEq/L (ref 19–32)
Calcium: 8.7 mg/dL (ref 8.4–10.5)
Creatinine, Ser: 1.12 mg/dL (ref 0.50–1.35)
GFR calc non Af Amer: 61 mL/min — ABNORMAL LOW (ref >90)
Glucose, Bld: 112 mg/dL — ABNORMAL HIGH (ref 70–99)
Potassium: 3.9 mEq/L (ref 3.5–5.1)

## 2012-11-03 NOTE — Progress Notes (Signed)
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 He stopped smoking over a year ago and has COPD, hypertension and hyperlipidemia. He had normal coronary arteries by cath performed by Dr. Jenne Campus in 2004 and normal LV function.   Dr. Allyson Sabal stented both of his iliacs in the past, as well as recanalized his right SFA. He did have an incidentally noted 80% left renal artery stenosis which we have been following by duplex ultrasound and has remained stable, as well as moderate left internal carotid artery stenosis. We follow this as well. He is neurologically asymptomatic. He did have recurrent claudication with a drop in his left ABI and reangiogram which revealed in-stent restenosis with progression of disease. Dr. Allyson Sabal restented his left iliac ostium with an iCAST covered stent and then stented his entire left external iliac artery with an Absolute Pro Nitinol self-expanding stent (8 x 100 mm.) His symptoms resolved and his Dopplers normalized. He no longer has claudication. He denies chest pain or shortness of breath. His most recent lipid profile performed in August revealed total cholesterol 112, LDL 62 and HDL 35.    He denies chest pain, shortness of breath or claudication. His Dopplers performed 09/12/12 did show a progression of the left renal artery stenosis. Based on this we'll decide to proceed with percutaneous intervention for renal preservation.    Subjective: No complaints  Objective: Vital signs in last 24 hours: Temp:  [97.7 F (36.5 C)-98.1 F (36.7 C)] 97.7 F (36.5 C) (10/03 0748) Pulse Rate:  [52-72] 71 (10/03 0748) Resp:  [16-20] 16 (10/03 0748) BP: (99-170)/(44-95) 121/68 mmHg (10/03 0748) SpO2:  [93 %-100 %] 98 % (10/03 0748) Weight:  [151 lb 0.2 oz (68.5 kg)] 151 lb 0.2 oz (68.5 kg) (10/03 0038) Weight  change:  Last BM Date: 11/01/12 Intake/Output from previous day: -1140 10/02 0701 - 10/03 0700 In: 360 [P.O.:360] Out: 1500 [Urine:1500] Intake/Output this shift:    PE: General:Pleasant affect, NAD Skin:Warm and dry, brisk capillary refill HEENT:normocephalic, sclera clear, mucus membranes moist Heart:S1S2 RRR without murmur, gallup, rub or click Lungs:clear without rales, rhonchi, or wheezes WUJ:WJXB, non tender, + BS, do not palpate liver spleen or masses Ext:no lower ext edema, 1+ pedal pulses, Rt. Groin without hematoma Neuro:alert and oriented, MAE, follows commands, + facial symmetry   Lab Results:  Recent Labs  11/03/12 0440  WBC 6.2  HGB 12.6*  HCT 37.5*  PLT 194   BMET  Recent Labs  11/03/12 0440  NA 139  K 3.9  CL 106  CO2 24  GLUCOSE 112*  BUN 20  CREATININE 1.12  CALCIUM 8.7   No results found for this basename: TROPONINI, CK, MB,  in the last 72 hours  No results found for this basename: CHOL,  HDL,  LDLCALC,  LDLDIRECT,  TRIG,  CHOLHDL   No results found for this basename: HGBA1C     Lab Results  Component Value Date   TSH 4.313 10/26/2012    Studies/Results: Successful left renal artery PTA and stenting for renal preservation in the setting of progressive left renal artery stenosis and decline in renal dimension. The sheath was removed and pressure was held on the groin to achieve hemostasis  Medications: I have reviewed medications. Scheduled Meds: . amLODipine  5 mg Oral QPM  . atorvastatin  20 mg Oral Daily  .  clopidogrel  75 mg Oral Daily  . famotidine  20 mg Oral Daily  . finasteride  5 mg Oral q morning - 10a  . irbesartan  150 mg Oral Daily  . nebivolol  10 mg Oral q morning - 10a  . tiotropium  18 mcg Inhalation Daily   Continuous Infusions:  PRN Meds:.acetaminophen, albuterol, morphine injection, ondansetron (ZOFRAN) IV  Assessment/Plan: Principal Problem:   Renal artery stenosis, progression of disease Active Problems:    PVD (peripheral vascular disease) with claudication, previous stents to bilateral iliacs. Rt. SFA also.    COPD (chronic obstructive pulmonary disease)   HTN (hypertension)   Normal coronary arteries- 2004, negative Myoview 2012   S/P arterial stent, to Lt renal art. 11/02/12  PLAN: Ambulate, outpt. Renal dopplers and follow up with Dr. Allyson Sabal.  LOS: 1 day   [facility]  Nurse Practitioner Certified Pager [phone] 11/03/2012, 7:56 AM

## 2012-11-03 NOTE — Discharge Summary (Signed)
Physician Discharge Summary       Patient ID: FARD BORUNDA MRN: [id number] DOB/AGE: 1933-01-16 79 y.o.  Admit date: 11/02/2012 Discharge date: 11/03/2012  Discharge Diagnoses:  Principal Problem:   Renal artery stenosis, progression of disease Active Problems:   PVD (peripheral vascular disease) with claudication, previous stents to bilateral iliacs. Rt. SFA also.    COPD (chronic obstructive pulmonary disease)   HTN (hypertension)   Normal coronary arteries- 2004, negative Myoview 2012   S/P arterial stent, to Lt renal art. 11/02/12   Discharged Condition: good  Procedures: Renal angiogram by Dr. Allyson Sabal 11/02/12 Successful left renal artery PTA and stenting for renal preservation in the setting of progressive left renal artery stenosis and decline in renal dimension by Dr. Allyson Sabal 11/02/12.   Hospital Course: History obtained from chart review. 79 year old, thin-appearing, married Caucasian male, father of 2, grandfather to 5 grandchildren who I last saw in the office 5 months ago. 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 He stopped smoking over a year ago and has COPD, hypertension and hyperlipidemia. He had normal coronary arteries by cath performed by Dr. Jenne Campus in 2004 and normal LV function. I stented both of his iliacs in the past, as well as recanalized his right SFA. He did have an incidentally noted 80% left renal artery stenosis which we have been following by duplex ultrasound and has remained stable, as well as moderate left internal carotid artery stenosis. We follow this as well. He is neurologically asymptomatic. He did have recurrent claudication with a drop in his left ABI and reangiogram which revealed in-stent restenosis with progression of disease. I restented his left iliac ostium with an iCAST covered stent and then stented his entire left external iliac artery with an Absolute Pro Nitinol self-expanding stent (8 x 100  mm.) His symptoms resolved and his Dopplers normalized. He no longer has claudication. He denies chest pain or shortness of breath. His most recent lipid profile performed in August revealed total cholesterol 112, LDL 62 and HDL 35.  His Renal Dopplers performed 09/12/12 did show a progression of the left renal artery stenosis. Based on this we decided to proceed with percutaneous intervention for renal preservation.   Pt underwent elective intervention of Lt renal artery which he tolerated without complications.  Today he has no complaints, BP controlled.  He was seen and felt stable for discharge by Dr. Rennis Golden. He will have follow up dopplers then see Dr. Allyson Sabal.    Consults: None  Significant Diagnostic Studies:  BMET    Component Value Date/Time   NA 139 11/03/2012 0440   K 3.9 11/03/2012 0440   CL 106 11/03/2012 0440   CO2 24 11/03/2012 0440   GLUCOSE 112* 11/03/2012 0440   BUN 20 11/03/2012 0440   CREATININE 1.12 11/03/2012 0440   CREATININE 1.18 10/26/2012 1133   CALCIUM 8.7 11/03/2012 0440   GFRNONAA 61* 11/03/2012 0440   GFRAA 71* 11/03/2012 0440    CBC    Component Value Date/Time   WBC 6.2 11/03/2012 0440   RBC 4.06* 11/03/2012 0440   HGB 12.6* 11/03/2012 0440   HCT 37.5* 11/03/2012 0440   PLT 194 11/03/2012 0440   MCV 92.4 11/03/2012 0440   MCH 31.0 11/03/2012 0440   MCHC 33.6 11/03/2012 0440   RDW 14.1 11/03/2012 0440   LYMPHSABS 1.3 02/14/2007 0840   MONOABS 0.6 02/14/2007 0840   EOSABS 0.2 02/14/2007 0840   BASOSABS 0.0 02/14/2007 0840  Discharge Exam: Blood pressure 121/68, pulse 71, temperature 97.7 F (36.5 C), temperature source Oral, resp. rate 16, height 5\' 7"  (1.702 m), weight 151 lb 0.2 oz (68.5 kg), SpO2 98.00%.  AM exam:   PE: General:Pleasant affect, NAD  Skin:Warm and dry, brisk capillary refill  HEENT:normocephalic, sclera clear, mucus membranes moist  Heart:S1S2 RRR without murmur, gallup, rub or click  Lungs:clear without rales, rhonchi, or wheezes    ZOX:WRUE, non tender, + BS, do not palpate liver spleen or masses  Ext:no lower ext edema, 1+ pedal pulses, Rt. Groin without hematoma  Neuro:alert and oriented, MAE, follows commands, + facial symmetry   Disposition: 01-Home or Self Care   Future Appointments Provider Department Dept Phone   11/23/2012 10:30 AM [facility] [address] [phone]   11/29/2012 9:45 AM [name], MD [facility] [phone]       Medication List         albuterol 108 (90 BASE) MCG/ACT inhaler  Commonly known as:  PROVENTIL HFA;VENTOLIN HFA  Inhale 2 puffs into the lungs every 6 (six) hours as needed for shortness of breath.     amLODipine 5 MG tablet  Commonly known as:  NORVASC  Take 5 mg by mouth every evening.     atorvastatin 20 MG tablet  Commonly known as:  LIPITOR  Take 20 mg by mouth daily.     clopidogrel 75 MG tablet  Commonly known as:  PLAVIX  Take 75 mg by mouth daily.     ergocalciferol 50000 UNITS capsule  Commonly known as:  VITAMIN D2  Take 50,000 Units by mouth 2 (two) times a week. Tuesdays and Fridays     finasteride 5 MG tablet  Commonly known as:  PROSCAR  Take 5 mg by mouth every morning.     nebivolol 10 MG tablet  Commonly known as:  BYSTOLIC  Take 10 mg by mouth every morning.     olmesartan 40 MG tablet  Commonly known as:  BENICAR  Take 40 mg by mouth every morning.     ranitidine 150 MG tablet  Commonly known as:  ZANTAC  Take 150 mg by mouth daily as needed for heartburn.     tiotropium 18 MCG inhalation capsule  Commonly known as:  SPIRIVA  Place 18 mcg into inhaler and inhale daily.           Follow-up Information   Follow up with [name], MD On 11/29/2012. (at 9:45 AM to see for follow up)    Specialty:  Cardiology   Contact information:   [address] [place] [address] [phone]       Follow up with [name], MD On 11/23/2012. (  at 10:30 AM for doppler study)    Specialty:  Cardiology   Contact information:   [address] [place] [address] [phone]     Discharge Instructions: Call [facility] if any bleeding, swelling or drainage at cath site.  May shower, no tub baths for 48 hours for groin sticks.   no lifting over 10 pounds for 3 days No driving for 3 days  Heart healthy diet.   Signed: Leone Brand Nurse Practitioner-Certified [facility] 11/03/2012, 9:40 AM  Time spent on discharge :35 minutes.

## 2012-11-03 NOTE — Progress Notes (Signed)
Pt. Seen and examined. Agree with the NP/PA-C note as written.  Stable after left renal artery stenting. His blood pressure has been stable and well-controlled on 3 medications.  Ok for discharge today. Follow-up with Dr. Allyson Sabal.  Chrystie Nose, MD, [facility] Attending Cardiologist [facility]

## 2012-11-06 LAB — POCT ACTIVATED CLOTTING TIME: Activated Clotting Time: 222 seconds

## 2012-11-23 ENCOUNTER — Ambulatory Visit (HOSPITAL_COMMUNITY)
Admission: RE | Admit: 2012-11-23 | Discharge: 2012-11-23 | Disposition: A | Discharge status: Home / Self Care | Payer: Medicare Other | Source: Ambulatory Visit | Attending: Cardiology | Admitting: Cardiology

## 2012-11-23 DIAGNOSIS — I701 Atherosclerosis of renal artery: Secondary | ICD-10-CM | POA: Insufficient documentation

## 2012-11-23 NOTE — Progress Notes (Signed)
Renal Duplex Left Completed. Elorah Dewing, BS, RDMS, RVT  

## 2012-11-29 ENCOUNTER — Ambulatory Visit (INDEPENDENT_AMBULATORY_CARE_PROVIDER_SITE_OTHER): Payer: Medicare Other | Admitting: Cardiovascular Disease

## 2012-11-29 ENCOUNTER — Encounter: Payer: Self-pay | Admitting: Cardiovascular Disease

## 2012-11-29 VITALS — BP 136/76 | HR 68 | Ht 67.0 in | Wt 151.0 lb

## 2012-11-29 DIAGNOSIS — I739 Peripheral vascular disease, unspecified: Secondary | ICD-10-CM

## 2012-11-29 DIAGNOSIS — E782 Mixed hyperlipidemia: Secondary | ICD-10-CM

## 2012-11-29 DIAGNOSIS — Z9889 Other specified postprocedural states: Secondary | ICD-10-CM

## 2012-11-29 DIAGNOSIS — Z959 Presence of cardiac and vascular implant and graft, unspecified: Secondary | ICD-10-CM

## 2012-11-29 DIAGNOSIS — Z79899 Other long term (current) drug therapy: Secondary | ICD-10-CM

## 2012-11-29 DIAGNOSIS — I10 Essential (primary) hypertension: Secondary | ICD-10-CM

## 2012-11-29 DIAGNOSIS — I714 Abdominal aortic aneurysm, without rupture, unspecified: Secondary | ICD-10-CM

## 2012-11-29 DIAGNOSIS — I779 Disorder of arteries and arterioles, unspecified: Secondary | ICD-10-CM

## 2012-11-29 NOTE — Patient Instructions (Signed)
  We will see you back in follow up in 6 months with an extender and 1 year with Dr Allyson Sabal  Dr Allyson Sabal has ordered carotid dopplers to be done yearly (Aug 2015) and aortic dopplers, renal dopplers, and lower extremity dopplers done every 6 months (due April 2015)  Dr Allyson Sabal ordered for you to have blood work to check your cholesterol levels. This has to be done fasting.

## 2012-11-29 NOTE — Progress Notes (Signed)
11/29/2012 Douglas Monroe   March 06, 1932  [id number]  Primary Physician Gwynneth Aliment, MD Primary Cardiologist: [name] MD [name]   HPI:  The patient is a very pleasant 79 year old, thin-appearing, married Caucasian male, father of 2, grandfather to 5 grandchildren who I last saw in the office 5 months ago. 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 He stopped smoking over a year ago and has COPD, hypertension and hyperlipidemia. He had normal coronary arteries by cath performed by Dr. Jenne Campus in 2004 and normal LV function. I stented both of his iliacs in the past, as well as recanalized his right SFA. He did have an incidentally noted 80% left renal artery stenosis which we have been following by duplex ultrasound and has remained stable, as well as moderate left internal carotid artery stenosis. We follow this as well. He is neurologically asymptomatic. He did have recurrent claudication with a drop in his left ABI and reangiogram which revealed in-stent restenosis with progression of disease. I restented his left iliac ostium with an iCAST covered stent and then stented his entire left external iliac artery with an Absolute Pro Nitinol self-expanding stent (8 x 100 mm.) His symptoms resolved and his Dopplers normalized. He no longer has claudication. He denies chest pain or shortness of breath. His most recent lipid profile performed in August revealed total cholesterol 112, LDL 62 and HDL 35.  Since I saw him in the office 02/16/12 he has remained stable. He denies chest pain, shortness of breath or claudication. This will Dopplers performed 09/12/12 didn't show a progression of the left renal artery stenosis. Based on this we decided  to proceed with percutaneous intervention for renal preservation. This was performed in 11/02/12 at which time I placed a 6 mm x 12 mm long balloon expandable stent in the ostium of his left  renal artery which was 80% on angiography reduced to 0% residual. At the time of angiography to document a small abdominal aortic aneurysm which we are following up with ultrasound as well as patent stents in his common and external iliac arteries bilaterally.followup renal Dopplers performed 11/23/12 revealed a widely patent left renal artery.    Current Outpatient Prescriptions  Medication Sig Dispense Refill  . albuterol (PROVENTIL HFA;VENTOLIN HFA) 108 (90 BASE) MCG/ACT inhaler Inhale 2 puffs into the lungs every 6 (six) hours as needed for shortness of breath.       [name] amLODipine (NORVASC) 5 MG tablet Take 5 mg by mouth every evening.      [name] atorvastatin (LIPITOR) 20 MG tablet Take 20 mg by mouth daily.      . clopidogrel (PLAVIX) 75 MG tablet Take 75 mg by mouth daily.      . ergocalciferol (VITAMIN D2) 50000 UNITS capsule Take 50,000 Units by mouth 2 (two) times a week. Tuesdays and Fridays      . finasteride (PROSCAR) 5 MG tablet Take 5 mg by mouth every morning.      . nebivolol (BYSTOLIC) 10 MG tablet Take 10 mg by mouth every morning.      . olmesartan (BENICAR) 40 MG tablet Take 40 mg by mouth every morning.      . ranitidine (ZANTAC) 150 MG tablet Take 150 mg by mouth daily as needed for heartburn.       . tiotropium (SPIRIVA) 18 MCG inhalation capsule Place 18 mcg into inhaler and inhale daily.       No  current facility-administered medications for this visit.    No Known Allergies  History   Social History  . Marital Status: Married    Spouse Name: N/A    Number of Children: N/A  . Years of Education: N/A   Occupational History  . Not on file.   Social History Main Topics  . Smoking status: Former Smoker -- 1.00 packs/day for 60 years    Types: Cigarettes    Quit date: 06/02/2010  . Smokeless tobacco: Never Used  . Alcohol Use: No  . Drug Use: No  . Sexual Activity: No   Other Topics Concern  . Not on file   Social History Narrative  . No narrative on file       Review of Systems: General: negative for chills, fever, night sweats or weight changes.  Cardiovascular: negative for chest pain, dyspnea on exertion, edema, orthopnea, palpitations, paroxysmal nocturnal dyspnea or shortness of breath Dermatological: negative for rash Respiratory: negative for cough or wheezing Urologic: negative for hematuria Abdominal: negative for nausea, vomiting, diarrhea, bright red blood per rectum, melena, or hematemesis Neurologic: negative for visual changes, syncope, or dizziness All other systems reviewed and are otherwise negative except as noted above.    Blood pressure 136/76, pulse 68, height 5\' 7"  (1.702 m), weight 151 lb (68.493 kg).  General appearance: alert and no distress Neck: no adenopathy, no carotid bruit, no JVD, supple, symmetrical, trachea midline and thyroid not enlarged, symmetric, no tenderness/mass/nodules Lungs: clear to auscultation bilaterally Heart: regular rate and rhythm, S1, S2 normal, no murmur, click, rub or gallop Extremities: extremities normal, atraumatic, no cyanosis or edema and right femoral arterial puncture site was well-healed  EKG not performed today  ASSESSMENT AND PLAN:   S/P arterial stent, to Lt renal art. 11/02/12 Status post PTA and stenting left renal artery with a 6 mm x 12 mm long Penta stent 11/02/12 with a followup Doppler performed 11/23/12 this showed a widely patent renal artery. I will continue to follow this every 6 months.  Abdominal aortic aneurysm Measured 4.1 cm at last check. We'll continue to follow this on an annual basis by duplex ultrasound  Carotid artery disease Mild disease but ultrasound. We'll continue to monitor  HTN (hypertension) Under good control on current medication  PVD (peripheral vascular disease) with claudication, previous stents to bilateral iliacs. Rt. SFA also.  Status post bilateral iliac artery stenting as well as right SFA recanalization of a chronic total  occlusion 10/08/10 with most recent Dopplers performed in August that showed widely patent stents. Will continue to follow on a semiannual basis.      [name] MD FACP,FACC,FAHA, [facility] 11/29/2012 10:31 AM

## 2012-11-29 NOTE — Assessment & Plan Note (Signed)
Under good control on current medication

## 2012-11-29 NOTE — Assessment & Plan Note (Signed)
Status post PTA and stenting left renal artery with a 6 mm x 12 mm long Penta stent 11/02/12 with a followup Doppler performed 11/23/12 this showed a widely patent renal artery. I will continue to follow this every 6 months.

## 2012-11-29 NOTE — Assessment & Plan Note (Signed)
Status post bilateral iliac artery stenting as well as right SFA recanalization of a chronic total occlusion 10/08/10 with most recent Dopplers performed in August that showed widely patent stents. Will continue to follow on a semiannual basis.

## 2012-11-29 NOTE — Assessment & Plan Note (Signed)
Mild disease but ultrasound. We'll continue to monitor

## 2012-11-29 NOTE — Assessment & Plan Note (Signed)
Measured 4.1 cm at last check. We'll continue to follow this on an annual basis by duplex ultrasound

## 2013-01-29 ENCOUNTER — Other Ambulatory Visit: Payer: Self-pay | Admitting: Cardiovascular Disease

## 2013-01-29 NOTE — Telephone Encounter (Signed)
Rx was sent to pharmacy electronically. 

## 2013-02-01 ENCOUNTER — Other Ambulatory Visit: Payer: Self-pay | Admitting: Cardiovascular Disease

## 2013-02-02 NOTE — Telephone Encounter (Signed)
Rx was sent to pharmacy electronically. 

## 2013-03-06 LAB — LIPID PANEL
Cholesterol: 113 mg/dL (ref 0–200)
HDL: 34 mg/dL — ABNORMAL LOW (ref >39)
LDL Cholesterol: 60 mg/dL (ref 0–99)
Total CHOL/HDL Ratio: 3.3 Ratio
Triglycerides: 97 mg/dL (ref <150)
VLDL: 19 mg/dL (ref 0–40)

## 2013-03-06 LAB — HEPATIC FUNCTION PANEL
ALT: 19 U/L (ref 0–53)
AST: 18 U/L (ref 0–37)
Albumin: 4 g/dL (ref 3.5–5.2)
Alkaline Phosphatase: 111 U/L (ref 39–117)
Bilirubin, Direct: 0.1 mg/dL (ref 0.0–0.3)
Indirect Bilirubin: 0.3 mg/dL (ref 0.2–1.2)
Total Bilirubin: 0.4 mg/dL (ref 0.2–1.2)
Total Protein: 7.1 g/dL (ref 6.0–8.3)

## 2013-03-07 ENCOUNTER — Encounter: Payer: Self-pay | Admitting: *Deleted

## 2013-03-30 ENCOUNTER — Other Ambulatory Visit: Payer: Self-pay | Admitting: Cardiovascular Disease

## 2013-03-30 NOTE — Telephone Encounter (Signed)
Rx was sent to pharmacy electronically. 

## 2013-04-20 ENCOUNTER — Encounter (HOSPITAL_COMMUNITY): Payer: Self-pay | Admitting: Emergency Medicine

## 2013-04-20 ENCOUNTER — Emergency Department (HOSPITAL_COMMUNITY): Payer: Medicare Other

## 2013-04-20 ENCOUNTER — Emergency Department (HOSPITAL_COMMUNITY)
Admission: EM | Admit: 2013-04-20 | Discharge: 2013-04-20 | Disposition: A | Discharge status: Home / Self Care | Payer: Medicare Other | Attending: Emergency Medicine | Admitting: Emergency Medicine

## 2013-04-20 DIAGNOSIS — N200 Calculus of kidney: Secondary | ICD-10-CM | POA: Insufficient documentation

## 2013-04-20 DIAGNOSIS — Z8739 Personal history of other diseases of the musculoskeletal system and connective tissue: Secondary | ICD-10-CM | POA: Insufficient documentation

## 2013-04-20 DIAGNOSIS — K219 Gastro-esophageal reflux disease without esophagitis: Secondary | ICD-10-CM | POA: Insufficient documentation

## 2013-04-20 DIAGNOSIS — R319 Hematuria, unspecified: Secondary | ICD-10-CM

## 2013-04-20 DIAGNOSIS — R52 Pain, unspecified: Secondary | ICD-10-CM

## 2013-04-20 DIAGNOSIS — E7800 Pure hypercholesterolemia, unspecified: Secondary | ICD-10-CM | POA: Insufficient documentation

## 2013-04-20 DIAGNOSIS — Z7902 Long term (current) use of antithrombotics/antiplatelets: Secondary | ICD-10-CM | POA: Insufficient documentation

## 2013-04-20 DIAGNOSIS — I10 Essential (primary) hypertension: Secondary | ICD-10-CM | POA: Insufficient documentation

## 2013-04-20 DIAGNOSIS — Z87891 Personal history of nicotine dependence: Secondary | ICD-10-CM | POA: Insufficient documentation

## 2013-04-20 DIAGNOSIS — Z79899 Other long term (current) drug therapy: Secondary | ICD-10-CM | POA: Insufficient documentation

## 2013-04-20 DIAGNOSIS — Z9861 Coronary angioplasty status: Secondary | ICD-10-CM | POA: Insufficient documentation

## 2013-04-20 DIAGNOSIS — J438 Other emphysema: Secondary | ICD-10-CM | POA: Insufficient documentation

## 2013-04-20 LAB — CBC WITH DIFFERENTIAL/PLATELET
BASOS ABS: 0 10*3/uL (ref 0.0–0.1)
Basophils Relative: 0 % (ref 0–1)
EOS PCT: 2 % (ref 0–5)
Eosinophils Absolute: 0.1 10*3/uL (ref 0.0–0.7)
HEMATOCRIT: 43.7 % (ref 39.0–52.0)
Hemoglobin: 14.9 g/dL (ref 13.0–17.0)
Lymphocytes Relative: 12 % (ref 12–46)
Lymphs Abs: 0.9 10*3/uL (ref 0.7–4.0)
MCH: 31.9 pg (ref 26.0–34.0)
MCHC: 34.1 g/dL (ref 30.0–36.0)
MCV: 93.6 fL (ref 78.0–100.0)
Monocytes Absolute: 0.5 10*3/uL (ref 0.1–1.0)
Monocytes Relative: 6 % (ref 3–12)
Neutro Abs: 6.3 10*3/uL (ref 1.7–7.7)
Neutrophils Relative %: 81 % — ABNORMAL HIGH (ref 43–77)
PLATELETS: 182 10*3/uL (ref 150–400)
RBC: 4.67 MIL/uL (ref 4.22–5.81)
RDW: 14.3 % (ref 11.5–15.5)
WBC: 7.8 10*3/uL (ref 4.0–10.5)

## 2013-04-20 LAB — BASIC METABOLIC PANEL
BUN: 21 mg/dL (ref 6–23)
CALCIUM: 9.5 mg/dL (ref 8.4–10.5)
CO2: 28 meq/L (ref 19–32)
CREATININE: 1.05 mg/dL (ref 0.50–1.35)
Chloride: 106 mEq/L (ref 96–112)
GFR calc Af Amer: 76 mL/min — ABNORMAL LOW (ref >90)
GFR, EST NON AFRICAN AMERICAN: 66 mL/min — AB (ref >90)
Glucose, Bld: 112 mg/dL — ABNORMAL HIGH (ref 70–99)
Potassium: 4.1 mEq/L (ref 3.7–5.3)
Sodium: 146 mEq/L (ref 137–147)

## 2013-04-20 LAB — URINALYSIS, ROUTINE W REFLEX MICROSCOPIC
Bilirubin Urine: NEGATIVE
GLUCOSE, UA: NEGATIVE mg/dL
KETONES UR: NEGATIVE mg/dL
Nitrite: NEGATIVE
PROTEIN: 30 mg/dL — AB
Specific Gravity, Urine: 1.022 (ref 1.005–1.030)
UROBILINOGEN UA: 0.2 mg/dL (ref 0.0–1.0)
pH: 6.5 (ref 5.0–8.0)

## 2013-04-20 LAB — URINE MICROSCOPIC-ADD ON

## 2013-04-20 LAB — TROPONIN I

## 2013-04-20 MED ORDER — OXYCODONE-ACETAMINOPHEN 5-325 MG PO TABS: 1.0000 | ORAL_TABLET | ORAL | Status: DC | PRN

## 2013-04-20 MED ORDER — IOHEXOL 350 MG/ML SOLN
100.0000 mL | Freq: Once | INTRAVENOUS | Status: AC | PRN
  Administered 2013-04-20: 100 mL via INTRAVENOUS

## 2013-04-20 MED ORDER — ONDANSETRON 4 MG PO TBDP: 4.0000 mg | ORAL_TABLET | Freq: Three times a day (TID) | ORAL | Status: DC | PRN

## 2013-04-20 NOTE — ED Notes (Signed)
Pedal pulses audible by doppler bilateral

## 2013-04-20 NOTE — Discharge Instructions (Signed)
Take the prescribed medication as directed for pain and nausea.  Try to drink water. Strain urine to monitor for passage of kidney stones. Follow-up with Dr. [name] if begin experience difficulty urinating or problems related to kidney stone. Follow-up with Dr. Gwenlyn Found for close monitoring of the new arterial stenosis found on your CT today.  He has access to medical records and will see scans and lab work from today. Return to the ED for new or worsening symptoms.

## 2013-04-20 NOTE — ED Notes (Signed)
CT Tech [facility]) notified pt has 20G in L AC.

## 2013-04-20 NOTE — ED Provider Notes (Signed)
CSN: [id number]     Arrival date & time 04/20/13  [id number] History   First MD Initiated Contact with Patient 04/20/13 0915     Chief Complaint  Patient presents with  . Abdominal Pain  . Flank Pain  . Back Pain     (Consider location/radiation/quality/duration/timing/severity/associated sxs/prior Treatment) The history is provided by the patient, medical records and the spouse.   This is a 80 y.o. M with PMH significant for HTN, COPD, RAS w/stent placement, PVD with LE stenting, AAA last measured 4.1cm on last evaluation, lhx kidney stones, presenting to the ED for left flank pain, suddent onset at 0700 this morning.  Pain described as sharp, localized to left lateral abdomen with radiation to flank and groin.  Some associated nausea but no vomiting.  Wife also notes that pt has had minimal urine output this morning but what he did produce was dark in color with foul odor, question of hematuria.  Of note, patient's wife states he does not drink any water throughout the day, only coffee and Pepsi. On arrival to ED, pt states sx have resolved.  He denies any chest pain, SOB, or claudication sx in his LE.  VS stable.  Past Medical History  Diagnosis Date  . Hypertension   . COPD (chronic obstructive pulmonary disease)     NUCLEAR STRESS TEST, 06/12/2010 - normal  . Chronic bronchitis   . Exertional dyspnea   . Shortness of breath     "when it's muggy; sometimes when lying down"  . GERD (gastroesophageal reflux disease)   . Sleep apnea 08/20/11    /wife's diagnosis  . PVD (peripheral vascular disease) with claudication 08/20/2011    LEA DOPPLER, 03/09/2012 - INFRARENAL AORTA-known distal fusiform aneurysm with mural thrombus noted measuring 4.0x4.1cm; RIGHT EIA/SFA ARTERIAL STENTS-demonstrate mild-moderate mixed density plaque  . S/P angioplasty with stent of ostium of Lt. common iliac artery and PTA and stent of Lt external and common femoral arteries. 08/20/2011  . HTN (hypertension) 08/20/2011  .  Renal artery stenosis 11/2012    Lt renal art stent placed  . Abdominal aortic aneurysm   . History of tobacco abuse   . High cholesterol   . Emphysema   . Arthritis     "hands" (11/02/2012)   Past Surgical History  Procedure Laterality Date  . Iliac artery stent  08/19/11    "today is my 3rd surgery for stent in my legs; I've had OR on both sides"  . Soft tissue cyst excision  1960's    "outside part of lung LLL"  . Lumbar disc surgery  1970's  . Cardiac catheterization  10/26/2002    No significant CAD, normal LV systolic function, no evidence of renal artery stenosis, systemic hypertension  . Abdominal angiogram  08/19/2011    Left common iliac artery at the ostium, 6x51mm ICAST covered stent, common femoral artery, 8x153mm Abbott absolute pro nitinol self-expanding stent and resulting in reduction of 80 and 90% stenosis to 0% residual  . Abdominal angiogram  10/08/2010    Mid-right SFA, 6x120 EV3 Protege nitinol self-expanding stent, resulting in reduction of CTO to 0% residual  . Abdominal angiogram  06/22/2010    Rt Common Femoral Artery-8x3 Absolute Pro Nitinol self-expanding stent-90% stenosis to 0% residual; Rt External Iliac Artery-9x3 Absolute Pro Nitinol self-expanding stent-70% stenosis to 0% residual; Left External Iliac Artery-8x4 Absolute Pro Nitinol self-expanding stent-90% to 0% residual  . Renal artery stent Left 11/02/2012   Family History  Problem Relation Age of  Onset  . Hypertension Mother   . Cancer Father   . Diabetes Brother   . Hypertension Son    History  Substance Use Topics  . Smoking status: Former Smoker -- 1.00 packs/day for 60 years    Types: Cigarettes    Quit date: 06/02/2010  . Smokeless tobacco: Never Used  . Alcohol Use: No    Review of Systems  Gastrointestinal: Positive for nausea.  Genitourinary: Positive for flank pain.  All other systems reviewed and are negative.      Allergies  Review of patient's allergies indicates no known  allergies.  Home Medications   Current Outpatient Rx  Name  Route  Sig  Dispense  Refill  . albuterol (PROVENTIL HFA;VENTOLIN HFA) 108 (90 BASE) MCG/ACT inhaler   Inhalation   Inhale 2 puffs into the lungs every 6 (six) hours as needed for shortness of breath.          [name] amLODipine (NORVASC) 5 MG tablet   Oral   Take 5 mg by mouth every evening.         [name] atorvastatin (LIPITOR) 20 MG tablet      TAKE 1 TABLET BY MOUTH EVERY DAY   30 tablet   8   . BYSTOLIC 10 MG tablet      TAKE 1 TABLET BY MOUTH EVERY DAY   30 tablet   9   . clopidogrel (PLAVIX) 75 MG tablet   Oral   Take 75 mg by mouth daily.         . clopidogrel (PLAVIX) 75 MG tablet      TAKE 1 TABLET (75 MG TOTAL) BY MOUTH EVERY MORNING.   30 tablet   9   . ergocalciferol (VITAMIN D2) 50000 UNITS capsule   Oral   Take 50,000 Units by mouth 2 (two) times a week. Tuesdays and Fridays         . finasteride (PROSCAR) 5 MG tablet   Oral   Take 5 mg by mouth every morning.         . olmesartan (BENICAR) 40 MG tablet   Oral   Take 40 mg by mouth every morning.         . ranitidine (ZANTAC) 150 MG tablet   Oral   Take 150 mg by mouth daily as needed for heartburn.          . tiotropium (SPIRIVA) 18 MCG inhalation capsule   Inhalation   Place 18 mcg into inhaler and inhale daily.          BP 137/109  Pulse 73  Temp(Src) 97.7 F (36.5 C) (Oral)  Resp 16  Ht 5' 6.5" (1.689 m)  Wt 150 lb (68.04 kg)  BMI 23.85 kg/m2  SpO2 95%  Physical Exam  Nursing note and vitals reviewed. Constitutional: He is oriented to person, place, and time. He appears well-developed and well-nourished. No distress.  HENT:  Head: Normocephalic and atraumatic.  Mouth/Throat: Oropharynx is clear and moist.  Eyes: Conjunctivae and EOM are normal. Pupils are equal, round, and reactive to light.  Neck: Normal range of motion. Neck supple.  Cardiovascular: Normal rate, regular rhythm and normal heart sounds.    Pulmonary/Chest: Effort normal and breath sounds normal. No respiratory distress. He has no wheezes.  Abdominal: Soft. Bowel sounds are normal. There is no tenderness. There is no guarding and no CVA tenderness.  Abdomen soft, non-distended, non-tender on exam  Musculoskeletal: Normal range of motion. He exhibits no edema.  Strong DP pulses BLE  Neurological: He is alert and oriented to person, place, and time.  Skin: Skin is warm and dry. He is not diaphoretic.  Psychiatric: He has a normal mood and affect.    ED Course  Procedures (including critical care time) Labs Review Labs Reviewed  URINALYSIS, ROUTINE W REFLEX MICROSCOPIC - Abnormal; Notable for the following:    Color, Urine RED (*)    APPearance CLOUDY (*)    Hgb urine dipstick LARGE (*)    Protein, ur 30 (*)    Leukocytes, UA SMALL (*)    All other components within normal limits  CBC WITH DIFFERENTIAL - Abnormal; Notable for the following:    Neutrophils Relative % 81 (*)    All other components within normal limits  BASIC METABOLIC PANEL - Abnormal; Notable for the following:    Glucose, Bld 112 (*)    GFR calc non Af Amer 66 (*)    GFR calc Af Amer 76 (*)    All other components within normal limits  TROPONIN I  URINE MICROSCOPIC-ADD ON  I-STAT CG4 LACTIC ACID, ED   Imaging Review Ct Angio Abd/pel W/ And/or W/o  04/20/2013   CLINICAL DATA:  Bleeding. Possible stone. Prior renal artery extending.  EXAM: CT ANGIOGRAPHY ABDOMEN AND PELVIS WITH CONTRAST AND WITHOUT CONTRAST  TECHNIQUE: Multidetector CT imaging of the abdomen and pelvis was performed using the standard protocol during bolus administration of intravenous contrast. Multiplanar reconstructed images and MIPs were obtained and reviewed to evaluate the vascular anatomy.  CONTRAST:  158mL OMNIPAQUE IOHEXOL 350 MG/ML SOLN  COMPARISON:  CT ABD-PEL WO/W CM dated 04/05/2011  FINDINGS: Precontrast images demonstrate mild fullness of the left renal collecting system  and ureter with bilateral perinephric stranding, left greater than right. No renal stones on the left. There is a 5 mm stone in the proximal right ureter without hydronephrosis.  Postcontrast arterial phase imaging demonstrates significant aortic irregularity, calcifications and plaque. Displaced calcifications and luminal narrowing noted in the infrarenal aorta with a stable appearance since 2013. Mild aneurysmal dilatation of the infrarenal aorta measuring up to 3.5 cm, stable. Overall appearance of the aorta is stable.  Proximal left renal artery stent is noted in place. I see no evidence for recurrent stenosis. Slight narrowing at and just beyond the distal end of the stent does not appear to be hemodynamically significant. Calcified plaque noted in the proximal right renal artery without hemodynamically significant stenosis.  Tight stenosis at the origin of the superior mesenteric artery, greater than 70% in severity. No significant celiac artery stenosis. Inferior mesenteric artery is patent in the area of heavy plaque burden in the lower aorta.  Liver, spleen, gallbladder, pancreas, adrenals are unremarkable. Small scattered low-density lesions in the kidneys bilaterally which are stable since prior study and appear to represent small cysts.  Visualized large and small bowel as well as stomach are unremarkable. No acute bony abnormality.  Review of the MIP images confirms the above findings.  IMPRESSION: 5 mm nonobstructing proximal right ureteral stone. No hydronephrosis.  Slight fullness of the left renal collecting system and ureter without visible stone. Bilateral perinephric stranding.  Left renal artery stent appears patent. Minimal narrowing of the vessel at the distal end of the stent does not appear to be hemodynamically significant.  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  3.5 cm infrarenal abdominal aortic aneurysm with extensive calcifications and mural plaque,  stable.   Electronically Signed   By:  Rolm Baptise M.D.   On: 04/20/2013 12:38     EKG Interpretation   Date/Time:  Friday April 20 2013 09:46:16 EDT Ventricular Rate:  64 PR Interval:  206 QRS Duration: 96 QT Interval:  428 QTC Calculation: 442 R Axis:   -59 Text Interpretation:  Sinus rhythm Left anterior fascicular block  Anteroseptal infarct, age indeterminate Baseline wander in lead(s) V6 No  significant change was found Confirmed by Wyvonnia Dusky  MD, STEPHEN [phone]) on  04/20/2013 9:51:23 AM      MDM   Final diagnoses:  Kidney stones  Hematuria   This is a 80 year old male with history of renal artery stenosis w/stenting presenting to the ED for sudden onset left flank pain this morning 0700. Patient also has history of renal stones.  Pt currently pain free on arrival to ED.  Etiology of pain renal stone vs occluded stent.  Will obtain basic labs, u/a, and CT angio abd/pelvis  EKG NSR, LAFB unchanged from previous.  Labs reassuring, renal function preserved. UA with large blood but no infection. CT angiogram abdomen and pelvis with right proximal ureteral stone without hydronephrosis, fullness of left renal collecting system possibly due to recently passed stone. AAA appears stable without signs of dissection.  Patient also has incidental finding of greater than 70% stenosis in the right proximal SMA.  This was discussed with vascular surgery-- likely an incidental finding and as pt has no post-prandial pain or any current pain at this time, low suspicion for ischemic bowel.  Pt has remained afebrile and in no acute pain while in the ED.  Feel he can be safely discharged home.  SMA stenosis will be followed closely by Dr. Gwenlyn Found.  Patient is established with Dr. [name] from [facility] whom he will followup with if begin experiencing difficulty urinating or problems related to his kidney stone. He is given a urine strainer and instructed to strain his urine to monitor for passage of  right ureteral stone.  Encouraged to increase water intake at home.  Rx percocet and zofran.  Discussed plan with pt and wife, they acknowledged understanding and agreed with plan of care.  Strict return precautions advised for new or worsening sx.  Discussed with Dr. Wyvonnia Dusky who personally evaluated pt and agrees with assessment and plan of care.  Larene Pickett, PA-C 04/20/13 1530

## 2013-04-20 NOTE — ED Notes (Signed)
Pt c/o left sided abdominal pain that radiates around to the back onset 0700 today. Visitor reports that pt has a stent in his left kidney. Pt reports this morning that he only had small amount of urine. Visitor reports that pt urine was very dark this morning.

## 2013-04-21 NOTE — ED Provider Notes (Signed)
Medical screening examination/treatment/procedure(s) were conducted as a shared visit with non-physician practitioner(s) and myself.  I personally evaluated the patient during the encounter.  L flank pain radiating to groin since 7am with nausea. Now resolved, no pain in ED. Intact femoral and DP pulses, abdomen soft and nontender Likely passed ureteral stone with hematuria in UA.  Fullness to L collecting system on CT.  Interestingly, no symptoms from R ureteral stone. AAA stable. L renal artery stent patent. SMA stenosis dw Dr. Scot Dock and Dr. Gwenlyn Found who knows patient well. No postprandial pain, no weight loss, pain resolved making mesenteric ischemia unlikely.   EKG Interpretation   Date/Time:  Friday April 20 2013 09:46:16 EDT Ventricular Rate:  64 PR Interval:  206 QRS Duration: 96 QT Interval:  428 QTC Calculation: 442 R Axis:   -59 Text Interpretation:  Sinus rhythm Left anterior fascicular block  Anteroseptal infarct, age indeterminate Baseline wander in lead(s) V6 No  significant change was found Confirmed by Wyvonnia Dusky  MD, Benney [place] [phone]) on  04/20/2013 9:51:23 AM       Ezequiel Essex, MD 04/21/13 1247

## 2013-04-24 ENCOUNTER — Emergency Department (HOSPITAL_COMMUNITY)
Admission: EM | Admit: 2013-04-24 | Discharge: 2013-04-24 | Disposition: A | Discharge status: Home / Self Care | Payer: Medicare Other | Attending: Emergency Medicine | Admitting: Emergency Medicine

## 2013-04-24 ENCOUNTER — Encounter (HOSPITAL_COMMUNITY): Payer: Self-pay | Admitting: Emergency Medicine

## 2013-04-24 DIAGNOSIS — E7800 Pure hypercholesterolemia, unspecified: Secondary | ICD-10-CM | POA: Insufficient documentation

## 2013-04-24 DIAGNOSIS — Z87891 Personal history of nicotine dependence: Secondary | ICD-10-CM | POA: Insufficient documentation

## 2013-04-24 DIAGNOSIS — M129 Arthropathy, unspecified: Secondary | ICD-10-CM | POA: Insufficient documentation

## 2013-04-24 DIAGNOSIS — N200 Calculus of kidney: Secondary | ICD-10-CM

## 2013-04-24 DIAGNOSIS — J438 Other emphysema: Secondary | ICD-10-CM | POA: Insufficient documentation

## 2013-04-24 DIAGNOSIS — K219 Gastro-esophageal reflux disease without esophagitis: Secondary | ICD-10-CM | POA: Insufficient documentation

## 2013-04-24 DIAGNOSIS — Z9889 Other specified postprocedural states: Secondary | ICD-10-CM | POA: Insufficient documentation

## 2013-04-24 DIAGNOSIS — Z7902 Long term (current) use of antithrombotics/antiplatelets: Secondary | ICD-10-CM | POA: Insufficient documentation

## 2013-04-24 DIAGNOSIS — Z9861 Coronary angioplasty status: Secondary | ICD-10-CM | POA: Insufficient documentation

## 2013-04-24 DIAGNOSIS — M19049 Primary osteoarthritis, unspecified hand: Secondary | ICD-10-CM | POA: Insufficient documentation

## 2013-04-24 DIAGNOSIS — G4730 Sleep apnea, unspecified: Secondary | ICD-10-CM | POA: Insufficient documentation

## 2013-04-24 DIAGNOSIS — Z79899 Other long term (current) drug therapy: Secondary | ICD-10-CM | POA: Insufficient documentation

## 2013-04-24 DIAGNOSIS — M25549 Pain in joints of unspecified hand: Secondary | ICD-10-CM | POA: Insufficient documentation

## 2013-04-24 DIAGNOSIS — I10 Essential (primary) hypertension: Secondary | ICD-10-CM | POA: Insufficient documentation

## 2013-04-24 LAB — URINALYSIS, ROUTINE W REFLEX MICROSCOPIC
Bilirubin Urine: NEGATIVE
Glucose, UA: NEGATIVE mg/dL
Ketones, ur: NEGATIVE mg/dL
Leukocytes, UA: NEGATIVE
NITRITE: NEGATIVE
PH: 5.5 (ref 5.0–8.0)
Protein, ur: NEGATIVE mg/dL
SPECIFIC GRAVITY, URINE: 1.023 (ref 1.005–1.030)
Urobilinogen, UA: 0.2 mg/dL (ref 0.0–1.0)

## 2013-04-24 LAB — CBC WITH DIFFERENTIAL/PLATELET
Basophils Absolute: 0 10*3/uL (ref 0.0–0.1)
Basophils Relative: 0 % (ref 0–1)
EOS ABS: 0.1 10*3/uL (ref 0.0–0.7)
EOS PCT: 1 % (ref 0–5)
HCT: 41.7 % (ref 39.0–52.0)
HEMOGLOBIN: 14 g/dL (ref 13.0–17.0)
Lymphocytes Relative: 15 % (ref 12–46)
Lymphs Abs: 1.2 10*3/uL (ref 0.7–4.0)
MCH: 31.4 pg (ref 26.0–34.0)
MCHC: 33.6 g/dL (ref 30.0–36.0)
MCV: 93.5 fL (ref 78.0–100.0)
MONOS PCT: 6 % (ref 3–12)
Monocytes Absolute: 0.5 10*3/uL (ref 0.1–1.0)
Neutro Abs: 6.2 10*3/uL (ref 1.7–7.7)
Neutrophils Relative %: 78 % — ABNORMAL HIGH (ref 43–77)
PLATELETS: 165 10*3/uL (ref 150–400)
RBC: 4.46 MIL/uL (ref 4.22–5.81)
RDW: 14.2 % (ref 11.5–15.5)
WBC: 8 10*3/uL (ref 4.0–10.5)

## 2013-04-24 LAB — COMPREHENSIVE METABOLIC PANEL
ALT: 24 U/L (ref 0–53)
AST: 24 U/L (ref 0–37)
Albumin: 3.5 g/dL (ref 3.5–5.2)
Alkaline Phosphatase: 120 U/L — ABNORMAL HIGH (ref 39–117)
BUN: 22 mg/dL (ref 6–23)
CALCIUM: 9.6 mg/dL (ref 8.4–10.5)
CO2: 26 mEq/L (ref 19–32)
Chloride: 104 mEq/L (ref 96–112)
Creatinine, Ser: 0.97 mg/dL (ref 0.50–1.35)
GFR calc non Af Amer: 77 mL/min — ABNORMAL LOW (ref >90)
GFR, EST AFRICAN AMERICAN: 89 mL/min — AB (ref >90)
Glucose, Bld: 102 mg/dL — ABNORMAL HIGH (ref 70–99)
Potassium: 4.5 mEq/L (ref 3.7–5.3)
Sodium: 143 mEq/L (ref 137–147)
TOTAL PROTEIN: 7.2 g/dL (ref 6.0–8.3)
Total Bilirubin: 0.3 mg/dL (ref 0.3–1.2)

## 2013-04-24 LAB — URINE MICROSCOPIC-ADD ON

## 2013-04-24 NOTE — ED Provider Notes (Signed)
Medical screening examination/treatment/procedure(s) were performed by non-physician practitioner and as supervising physician I was immediately available for consultation/collaboration.   EKG Interpretation None       Kalman Drape, MD 04/24/13 1737

## 2013-04-24 NOTE — ED Notes (Signed)
Pt. reports RLQ , right flank and right testicle pain onset this evening , denies dysuria or hematuria / no nausea or vomitting .

## 2013-04-24 NOTE — Discharge Instructions (Signed)
Take your percocet as needed for pain. Take zofran as needed for nausea. Refer to attached documents for more information.

## 2013-04-24 NOTE — ED Provider Notes (Signed)
CSN: [id number]     Arrival date & time 04/24/13  0223 History   First MD Initiated Contact with Patient 04/24/13 [phone]     Chief Complaint  Patient presents with  . Abdominal Pain  . Flank Pain  . Testicle Pain     (Consider location/radiation/quality/duration/timing/severity/associated sxs/prior Treatment) HPI Comments: Patient is a 80 year old male with a past medical history of COPD, AAA, and recent kidney stone who presents with right flank pain that started 3 days ago. Patient was seen in the ED 4 days ago for a left side kidney stone and the CT showed a right proximal stone as well. The pain is located in his right flank and radiates down to his RLQ and testicles. The pain is described as dull and severe. The pain started gradually and progressively worsened since the onset. No alleviating/aggravating factors. The patient has tried nothing for symptoms without relief. Patient was given prescriptions at his last visit that he did not fill. Associated symptoms include nausea and hematuria. Patient denies fever, headache, vomiting, diarrhea, chest pain, SOB, dysuria, constipation.   Patient is a 80 y.o. male presenting with abdominal pain, flank pain, and testicular pain.  Abdominal Pain Associated symptoms: no chest pain, no chills, no diarrhea, no dysuria, no fatigue, no fever, no nausea, no shortness of breath and no vomiting   Flank Pain Pertinent negatives include no abdominal pain, arthralgias, chest pain, chills, fatigue, fever, nausea, neck pain, vomiting or weakness.  Testicle Pain Pertinent negatives include no abdominal pain, arthralgias, chest pain, chills, fatigue, fever, nausea, neck pain, vomiting or weakness.    Past Medical History  Diagnosis Date  . Hypertension   . COPD (chronic obstructive pulmonary disease)     NUCLEAR STRESS TEST, 06/12/2010 - normal  . Chronic bronchitis   . Exertional dyspnea   . Shortness of breath     "when it's muggy; sometimes when lying  down"  . GERD (gastroesophageal reflux disease)   . Sleep apnea 08/20/11    /wife's diagnosis  . PVD (peripheral vascular disease) with claudication 08/20/2011    LEA DOPPLER, 03/09/2012 - INFRARENAL AORTA-known distal fusiform aneurysm with mural thrombus noted measuring 4.0x4.1cm; RIGHT EIA/SFA ARTERIAL STENTS-demonstrate mild-moderate mixed density plaque  . S/P angioplasty with stent of ostium of Lt. common iliac artery and PTA and stent of Lt external and common femoral arteries. 08/20/2011  . HTN (hypertension) 08/20/2011  . Renal artery stenosis 11/2012    Lt renal art stent placed  . Abdominal aortic aneurysm   . History of tobacco abuse   . High cholesterol   . Emphysema   . Arthritis     "hands" (11/02/2012)   Past Surgical History  Procedure Laterality Date  . Iliac artery stent  08/19/11    "today is my 3rd surgery for stent in my legs; I've had OR on both sides"  . Soft tissue cyst excision  1960's    "outside part of lung LLL"  . Lumbar disc surgery  1970's  . Cardiac catheterization  10/26/2002    No significant CAD, normal LV systolic function, no evidence of renal artery stenosis, systemic hypertension  . Abdominal angiogram  08/19/2011    Left common iliac artery at the ostium, 6x33mm ICAST covered stent, common femoral artery, 8x130mm Abbott absolute pro nitinol self-expanding stent and resulting in reduction of 80 and 90% stenosis to 0% residual  . Abdominal angiogram  10/08/2010    Mid-right SFA, 6x120 EV3 Protege nitinol self-expanding stent, resulting  in reduction of CTO to 0% residual  . Abdominal angiogram  06/22/2010    Rt Common Femoral Artery-8x3 Absolute Pro Nitinol self-expanding stent-90% stenosis to 0% residual; Rt External Iliac Artery-9x3 Absolute Pro Nitinol self-expanding stent-70% stenosis to 0% residual; Left External Iliac Artery-8x4 Absolute Pro Nitinol self-expanding stent-90% to 0% residual  . Renal artery stent Left 11/02/2012   Family History  Problem  Relation Age of Onset  . Hypertension Mother   . Cancer Father   . Diabetes Brother   . Hypertension Son    History  Substance Use Topics  . Smoking status: Former Smoker -- 1.00 packs/day for 60 years    Types: Cigarettes    Quit date: 06/02/2010  . Smokeless tobacco: Never Used  . Alcohol Use: No    Review of Systems  Constitutional: Negative for fever, chills and fatigue.  HENT: Negative for trouble swallowing.   Eyes: Negative for visual disturbance.  Respiratory: Negative for shortness of breath.   Cardiovascular: Negative for chest pain and palpitations.  Gastrointestinal: Negative for nausea, vomiting, abdominal pain and diarrhea.  Genitourinary: Positive for flank pain and testicular pain. Negative for dysuria and difficulty urinating.  Musculoskeletal: Negative for arthralgias and neck pain.  Skin: Negative for color change.  Neurological: Negative for dizziness and weakness.  Psychiatric/Behavioral: Negative for dysphoric mood.      Allergies  Review of patient's allergies indicates no known allergies.  Home Medications   Current Outpatient Rx  Name  Route  Sig  Dispense  Refill  . albuterol (PROVENTIL HFA;VENTOLIN HFA) 108 (90 BASE) MCG/ACT inhaler   Inhalation   Inhale 2 puffs into the lungs every 6 (six) hours as needed for shortness of breath.          [name] amLODipine (NORVASC) 5 MG tablet   Oral   Take 5 mg by mouth every evening.         . Aspirin-Acetaminophen-Caffeine (GOODY HEADACHE PO)   Oral   Take 1 Package by mouth 2 (two) times daily as needed (pain).         [name] atorvastatin (LIPITOR) 20 MG tablet   Oral   Take 20 mg by mouth daily.         . clopidogrel (PLAVIX) 75 MG tablet   Oral   Take 75 mg by mouth daily.         . finasteride (PROSCAR) 5 MG tablet   Oral   Take 5 mg by mouth every morning.         . nebivolol (BYSTOLIC) 10 MG tablet   Oral   Take 10 mg by mouth daily.         [name] olmesartan (BENICAR) 40 MG  tablet   Oral   Take 40 mg by mouth every morning.         . ondansetron (ZOFRAN ODT) 4 MG disintegrating tablet   Oral   Take 1 tablet (4 mg total) by mouth every 8 (eight) hours as needed for nausea.   10 tablet   0   . oxyCODONE-acetaminophen (PERCOCET/ROXICET) 5-325 MG per tablet   Oral   Take 1 tablet by mouth every 4 (four) hours as needed.   15 tablet   0   . ranitidine (ZANTAC) 150 MG tablet   Oral   Take 150 mg by mouth 2 (two) times daily as needed for heartburn.          . tiotropium (SPIRIVA) 18 MCG inhalation capsule   Inhalation  Place 18 mcg into inhaler and inhale daily.          BP 134/68  Pulse 59  Temp(Src) 98.1 F (36.7 C) (Oral)  Resp 18  SpO2 95% Physical Exam  Nursing note and vitals reviewed. Constitutional: He is oriented to person, place, and time. He appears well-developed and well-nourished. No distress.  HENT:  Head: Normocephalic and atraumatic.  Eyes: Conjunctivae and EOM are normal.  Neck: Normal range of motion.  Cardiovascular: Normal rate and regular rhythm.  Exam reveals no gallop and no friction rub.   No murmur heard. Pulmonary/Chest: Effort normal and breath sounds normal. He has no wheezes. He has no rales. He exhibits no tenderness.  Abdominal: Soft. He exhibits no distension. There is no tenderness. There is no rebound and no guarding.  Musculoskeletal: Normal range of motion.  Neurological: He is alert and oriented to person, place, and time. Coordination normal.  Speech is goal-oriented. Moves limbs without ataxia.   Skin: Skin is warm and dry.  Psychiatric: He has a normal mood and affect. His behavior is normal.    ED Course  Procedures (including critical care time) Labs Review Labs Reviewed  CBC WITH DIFFERENTIAL - Abnormal; Notable for the following:    Neutrophils Relative % 78 (*)    All other components within normal limits  COMPREHENSIVE METABOLIC PANEL - Abnormal; Notable for the following:     Glucose, Bld 102 (*)    Alkaline Phosphatase 120 (*)    GFR calc non Af Amer 77 (*)    GFR calc Af Amer 89 (*)    All other components within normal limits  URINALYSIS, ROUTINE W REFLEX MICROSCOPIC - Abnormal; Notable for the following:    APPearance CLOUDY (*)    Hgb urine dipstick LARGE (*)    All other components within normal limits  URINE MICROSCOPIC-ADD ON - Abnormal; Notable for the following:    Bacteria, UA FEW (*)    All other components within normal limits   Imaging Review No results found.   EKG Interpretation None      MDM   Final diagnoses:  Kidney stone    6:38 AM Patient complains of right flank pain that radiates to his testicles. Patient was seen in the ED 4 days ago and was found to have a proximal 69mm kidney stone on the right. Patient is likely passing that stone now. Patient was given Percocet and zofran prescription on his last visit which he did not fill. Vitals stable and patient afebrile. Patient now reports relief of pain. Patient will be discharged with instructions to return to [facility] with worsening or concerning symptoms.    Alvina Chou, [place] 04/24/13 [phone]

## 2013-04-24 NOTE — ED Notes (Signed)
Patient reports he took his regular night time meds, and a goody powder. At 1am this morning, he woke up from the pain.

## 2013-05-02 ENCOUNTER — Encounter: Payer: Self-pay | Admitting: Cardiovascular Disease

## 2013-05-17 ENCOUNTER — Ambulatory Visit (HOSPITAL_COMMUNITY)
Admission: RE | Admit: 2013-05-17 | Discharge: 2013-05-17 | Disposition: A | Discharge status: Home / Self Care | Payer: Medicare Other | Source: Ambulatory Visit | Attending: Cardiology | Admitting: Cardiology

## 2013-05-17 DIAGNOSIS — Z9889 Other specified postprocedural states: Secondary | ICD-10-CM | POA: Insufficient documentation

## 2013-05-17 DIAGNOSIS — Z959 Presence of cardiac and vascular implant and graft, unspecified: Secondary | ICD-10-CM

## 2013-05-17 NOTE — Progress Notes (Signed)
Renal Artery Duplex Completed. °Brianna L Mazza,RVT °

## 2013-05-22 ENCOUNTER — Encounter (HOSPITAL_COMMUNITY): Payer: Medicare Other

## 2013-05-28 ENCOUNTER — Encounter: Payer: Self-pay | Admitting: *Deleted

## 2013-07-24 ENCOUNTER — Encounter: Payer: Self-pay | Admitting: Cardiovascular Disease

## 2013-07-24 ENCOUNTER — Other Ambulatory Visit: Payer: Self-pay

## 2013-07-24 ENCOUNTER — Ambulatory Visit (INDEPENDENT_AMBULATORY_CARE_PROVIDER_SITE_OTHER): Payer: Medicare Other | Admitting: Cardiovascular Disease

## 2013-07-24 VITALS — BP 110/64 | HR 66 | Ht 66.5 in | Wt 148.6 lb

## 2013-07-24 DIAGNOSIS — Z9889 Other specified postprocedural states: Secondary | ICD-10-CM

## 2013-07-24 DIAGNOSIS — I739 Peripheral vascular disease, unspecified: Secondary | ICD-10-CM

## 2013-07-24 DIAGNOSIS — I714 Abdominal aortic aneurysm, without rupture, unspecified: Secondary | ICD-10-CM

## 2013-07-24 DIAGNOSIS — I10 Essential (primary) hypertension: Secondary | ICD-10-CM

## 2013-07-24 DIAGNOSIS — E785 Hyperlipidemia, unspecified: Secondary | ICD-10-CM | POA: Insufficient documentation

## 2013-07-24 DIAGNOSIS — Z959 Presence of cardiac and vascular implant and graft, unspecified: Secondary | ICD-10-CM

## 2013-07-24 NOTE — Assessment & Plan Note (Signed)
Status post left renal artery stenting by myself 11/02/12. His most recent renal Doppler study performed 05/17/13 revealed this to be widely patent.

## 2013-07-24 NOTE — Assessment & Plan Note (Signed)
Status post bilateral iliac stents as well as right SFA intervention in the past. The patient currently denies claudication. His most recent lower extremity arterial Doppler studies performed 09/05/12 revealed ABIs close to one bilaterally with patent stents.

## 2013-07-24 NOTE — Assessment & Plan Note (Signed)
On statin therapy with his most recent lipid profile performed 03/06/13 revealed a total cholesterol of 113, LDL of 60 and HDL of 34

## 2013-07-24 NOTE — Assessment & Plan Note (Signed)
Controlled on current medications 

## 2013-07-24 NOTE — Assessment & Plan Note (Signed)
When last measured in August of last year dimensions were 4 cm x 4.1 cm. Will continue to follow this noninvasively annually.

## 2013-07-24 NOTE — Patient Instructions (Addendum)
Your physician has requested that you have a lower or upper extremity arterial duplex to be done in August. This test is an ultrasound of the arteries in the legs or arms. It looks at arterial blood flow in the legs and arms. Allow one hour for Lower and Upper Arterial scans. There are no restrictions or special instructions.  Your physician has requested that you have an abdominal aorta duplex in August. During this test, an ultrasound is used to evaluate the aorta. Allow 30 minutes for this exam. Do not eat after midnight the day before and avoid carbonated beverages.  Dr Gwenlyn Found wants you to follow-up in 1 year. You will receive a reminder letter in the mail one months in advance. If you don't receive a letter, please call our office to schedule the follow-up appointment.

## 2013-07-24 NOTE — Progress Notes (Signed)
07/24/2013 Douglas Monroe   1932-08-13  [id number]  Primary Physician [name], MD Primary Cardiologist: [name] [name]   HPI:  The patient is a very pleasant 80 year old, thin-appearing, married Caucasian male, father of 2, grandfather to 5 grandchildren who I last saw in the office 5 months ago. He works part time Administrator and was referred initially to me for claudication. He stopped smoking over a year ago and has COPD, hypertension and hyperlipidemia. He had normal coronary arteries by cath performed by Dr. Tami Ribas in 2004 and normal LV function. I stented both of his iliacs in the past, as well as recanalized his right SFA. He did have an incidentally noted 80% left renal artery stenosis which we have been following by duplex ultrasound and has remained stable, as well as moderate left internal carotid artery stenosis. We follow this as well. He is neurologically asymptomatic. He did have recurrent claudication with a drop in his left ABI and reangiogram which revealed in-stent restenosis with progression of disease. I restented his left iliac ostium with an iCAST covered stent and then stented his entire left external iliac artery with an Absolute Pro Nitinol self-expanding stent (8 x 100 mm.) His symptoms resolved and his Dopplers normalized. He no longer has claudication. He denies chest pain or shortness of breath. His most recent lipid profile performed in August revealed total cholesterol 112, LDL 62 and HDL 35.  Since I saw him in the office 02/16/12 he has remained stable. He denies chest pain, shortness of breath or claudication. This will Dopplers performed 09/12/12 didn't show a progression of the left renal artery stenosis. Based on this we decided to proceed with percutaneous intervention for renal preservation. This was performed in 11/02/12 at which time I placed a 6 mm x 12 mm long balloon expandable stent in the ostium of his left renal  artery which was 80% on angiography reduced to 0% residual. At the time of angiography to document a small abdominal aortic aneurysm which we are following up with ultrasound as well as patent stents in his common and external iliac arteries bilaterally. Since I saw him back in the office 11/29/12 his remained stable. He denies chest pain, increasing shortness of breath or claudication. He does have COPD and stopped smoking back in 2012. His most recent renal Dopplers performed/16/15 revealed a widely patent left renal artery stent. His lipid profile is excellent.   Current Outpatient Prescriptions  Medication Sig Dispense Refill  . albuterol (PROVENTIL HFA;VENTOLIN HFA) 108 (90 BASE) MCG/ACT inhaler Inhale 2 puffs into the lungs every 6 (six) hours as needed for shortness of breath.       [name] amLODipine (NORVASC) 5 MG tablet Take 5 mg by mouth every evening.      . Aspirin-Acetaminophen-Caffeine (GOODY HEADACHE PO) Take 1 Package by mouth 2 (two) times daily as needed (pain).      [name] atorvastatin (LIPITOR) 20 MG tablet Take 20 mg by mouth daily.      . clopidogrel (PLAVIX) 75 MG tablet Take 75 mg by mouth daily.      . finasteride (PROSCAR) 5 MG tablet Take 5 mg by mouth every morning.      . nebivolol (BYSTOLIC) 10 MG tablet Take 10 mg by mouth daily.      [name] olmesartan (BENICAR) 40 MG tablet Take 40 mg by mouth every morning.      . ondansetron (ZOFRAN ODT) 4 MG disintegrating tablet Take 1  tablet (4 mg total) by mouth every 8 (eight) hours as needed for nausea.  10 tablet  0  . oxyCODONE-acetaminophen (PERCOCET/ROXICET) 5-325 MG per tablet Take 1 tablet by mouth every 4 (four) hours as needed.  15 tablet  0  . ranitidine (ZANTAC) 150 MG tablet Take 150 mg by mouth 2 (two) times daily as needed for heartburn.       . tamsulosin (FLOMAX) 0.4 MG CAPS capsule Take 1 capsule by mouth daily.      [name] tiotropium (SPIRIVA) 18 MCG inhalation capsule Place 18 mcg into inhaler and inhale daily.       No  current facility-administered medications for this visit.    No Known Allergies  History   Social History  . Marital Status: Married    Spouse Name: N/A    Number of Children: N/A  . Years of Education: N/A   Occupational History  . Not on file.   Social History Main Topics  . Smoking status: Former Smoker -- 1.00 packs/day for 60 years    Types: Cigarettes    Quit date: 06/02/2010  . Smokeless tobacco: Never Used  . Alcohol Use: No  . Drug Use: No  . Sexual Activity: No   Other Topics Concern  . Not on file   Social History Narrative  . No narrative on file     Review of Systems: General: negative for chills, fever, night sweats or weight changes.  Cardiovascular: negative for chest pain, dyspnea on exertion, edema, orthopnea, palpitations, paroxysmal nocturnal dyspnea or shortness of breath Dermatological: negative for rash Respiratory: negative for cough or wheezing Urologic: negative for hematuria Abdominal: negative for nausea, vomiting, diarrhea, bright red blood per rectum, melena, or hematemesis Neurologic: negative for visual changes, syncope, or dizziness All other systems reviewed and are otherwise negative except as noted above.    Blood pressure 110/64, pulse 66, height 5' 6.5" (1.689 m), weight 148 lb 9.6 oz (67.405 kg).  General appearance: alert and no distress Neck: no adenopathy, no JVD, supple, symmetrical, trachea midline, thyroid not enlarged, symmetric, no tenderness/mass/nodules and bilateral bruits Lungs: clear to auscultation bilaterally Heart: regular rate and rhythm, S1, S2 normal, no murmur, click, rub or gallop Extremities: extremities normal, atraumatic, no cyanosis or edema  EKG normal sinus rhythm at 66 without ST or T wave changes  ASSESSMENT AND PLAN:   PVD (peripheral vascular disease) with claudication, previous stents to bilateral iliacs. Rt. SFA also.  Status post bilateral iliac stents as well as right SFA intervention in  the past. The patient currently denies claudication. His most recent lower extremity arterial Doppler studies performed 09/05/12 revealed ABIs close to one bilaterally with patent stents.  S/P arterial stent, to Lt renal art. 11/02/12 Status post left renal artery stenting by myself 11/02/12. His most recent renal Doppler study performed 05/17/13 revealed this to be widely patent.  HTN (hypertension) Controlled on current medications  Normal coronary arteries- 2004, negative Myoview 2012 On statin therapy with his most recent lipid profile performed 03/06/13 revealed a total cholesterol of 113, LDL of 60 and HDL of 34  Abdominal aortic aneurysm When last measured in August of last year dimensions were 4 cm x 4.1 cm. Will continue to follow this noninvasively annually.      [name] FACP,FACC,FAHA, [facility] 07/24/2013 9:02 AM

## 2013-09-13 ENCOUNTER — Ambulatory Visit (HOSPITAL_BASED_OUTPATIENT_CLINIC_OR_DEPARTMENT_OTHER)
Admission: RE | Admit: 2013-09-13 | Discharge: 2013-09-13 | Disposition: A | Discharge status: Home / Self Care | Payer: Medicare Other | Source: Ambulatory Visit | Attending: Cardiovascular Disease | Admitting: Cardiovascular Disease

## 2013-09-13 ENCOUNTER — Ambulatory Visit (HOSPITAL_COMMUNITY)
Admission: RE | Admit: 2013-09-13 | Discharge: 2013-09-13 | Disposition: A | Discharge status: Home / Self Care | Payer: Medicare Other | Source: Ambulatory Visit | Attending: Cardiovascular Disease | Admitting: Cardiovascular Disease

## 2013-09-13 DIAGNOSIS — I714 Abdominal aortic aneurysm, without rupture, unspecified: Secondary | ICD-10-CM

## 2013-09-13 DIAGNOSIS — I739 Peripheral vascular disease, unspecified: Secondary | ICD-10-CM | POA: Diagnosis present

## 2013-09-13 NOTE — Progress Notes (Signed)
Lower Extremity Arterial Duplex Completed. °Brianna L Mazza,RVT °

## 2013-09-13 NOTE — Progress Notes (Signed)
Abdominal Aortic Duplex Completed °Brianna L Mazza,RVT °

## 2013-09-28 ENCOUNTER — Other Ambulatory Visit: Payer: Self-pay | Admitting: Urology

## 2013-10-03 ENCOUNTER — Encounter (HOSPITAL_BASED_OUTPATIENT_CLINIC_OR_DEPARTMENT_OTHER): Payer: Self-pay | Admitting: *Deleted

## 2013-10-03 ENCOUNTER — Telehealth: Payer: Self-pay | Admitting: Cardiovascular Disease

## 2013-10-03 NOTE — Telephone Encounter (Signed)
Okay to stop Plavix for removal of kidney stones.

## 2013-10-03 NOTE — Telephone Encounter (Signed)
Pt. Having kidney stone removed Wednesday and needs to be off plavix 5 days

## 2013-10-03 NOTE — Telephone Encounter (Signed)
Pt is suppose to have a kidney stone removed next Wednesday,will this be okay. He will need to stop his Plavix 5 days before that.

## 2013-10-03 NOTE — Telephone Encounter (Signed)
Dr Berry, please address 

## 2013-10-04 ENCOUNTER — Encounter (HOSPITAL_BASED_OUTPATIENT_CLINIC_OR_DEPARTMENT_OTHER): Payer: Self-pay | Admitting: *Deleted

## 2013-10-04 NOTE — Telephone Encounter (Signed)
Notified patient ,of DR Gwenlyn Found decision. Verbalized understanding.

## 2013-10-04 NOTE — Progress Notes (Signed)
Pt instructed npo p mn 9/8 x zantac, spiriva, bystolic,lipitor, finasteride w sip of water. Pt to use inhaler and bring with him dos. Ok to take pain med if needed w sip of water.  To [facility] 9/9 @ 0945.  Needs istat on arrival.  ekg in epic.

## 2013-10-04 NOTE — Progress Notes (Signed)
10/04/13 0938  OBSTRUCTIVE SLEEP APNEA  Have you ever been diagnosed with sleep apnea through a sleep study? No  Do you snore loudly (loud enough to be heard through closed doors)?  1  Do you often feel tired, fatigued, or sleepy during the daytime? 1  Has anyone observed you stop breathing during your sleep? 1 (states his wife has told him that he does)  Do you have, or are you being treated for high blood pressure? 1  BMI more than 35 kg/m2? 0  Age over 5 years old? 1  Neck circumference greater than 40 cm/16 inches? 0  Gender: 1  Obstructive Sleep Apnea Score 6  Score 4 or greater  Results sent to PCP

## 2013-10-09 NOTE — H&P (Signed)
Reason for visit   Definitive management of distal ureteral stone.     History of Present Illness   Mr Chaloux returns for an additional followup. The last several notes will outline his history. Again, he was diagnosed with about a 5 mm stone in his proximal right ureter but has clinically not had much problems. I last saw him back at the end of April. At that time he was again asymptomatic but had microhematuria. On KUB imaging the calcification that had been in the proximal ureter appeared to be in the right hemipelvis and appeared to be consistent with a distal ureteral stone. Over this last few months, he has had essentially no abdominal or flank pain, no real problems with voiding. If anything, on the Flomax urination has been much better and he has been quite satisfied with that.     Urinalysis today continues to show microhematuria. On limited right renal ultrasonography in June of 2015, the kidney measures 10.4 cm. There are multiple simple-appearing cortical cysts. Most importantly, there is no hydronephrosis. Postvoid residual was negligible. On KUB imaging there continues to be a suspicious 4-5 mm calcification in the right hemipelvis. It appears unchanged from his KUB done in late April. It is different than the KUB in early April where this calcification was not present.    Now in July of 2015 still no symptoms. KUB unchanged from last visit.   Past Medical History Problems  1. History of Chronic obstructive pulmonary disease (496) 2. History of hypercholesterolemia (V12.29) 3. History of hypertension (V12.59)  Surgical History Problems  1. History of Previous Stent Placement  Current Meds 1. AmLODIPine Besylate 5 MG Oral Tablet;  Therapy: [id number] to Recorded 2. Atorvastatin Calcium 20 MG Oral Tablet;  Therapy: [id number] to Recorded 3. Benicar TABS;  Therapy: (Recorded:31Aug2010) to Recorded 4. Bystolic TABS;  Therapy: (Recorded:31Aug2010) to Recorded 5.  Finasteride 5 MG Oral Tablet; Take 1 tablet every day;  Therapy: 31Aug2010 to (Evaluate:26Nov2015)  Requested for: [id number]; Last  Rx:01Dec2014 Ordered 6. Goodys Extra Strength PACK;  Therapy: (Recorded:31Aug2010) to Recorded 7. Plavix 75 MG Oral Tablet;  Therapy: (Recorded:19Feb2013) to Recorded 8. ProAir HFA 108 (90 Base) MCG/ACT Inhalation Aerosol Solution;  Therapy: (Recorded:30Oct2014) to Recorded 9. Ranitidine HCl - 150 MG Oral Capsule;  Therapy: (Recorded:30Oct2014) to Recorded 10. Spiriva HandiHaler 18 MCG Inhalation Capsule;   Therapy: (Recorded:31Aug2010) to Recorded 11. Tamsulosin HCl - 0.4 MG Oral Capsule; TAKE 1 CAPSULE EVERY DAY;   Therapy: 07Apr2015 to (Evaluate:09Nov2015)  Requested for: [id number]; Last   Rx:13May2015 Ordered  Allergies Medication  1. No Known Drug Allergies  Family History Problems  1. No pertinent family history : Mother, Father  Social History Problems  1. Former smoker (V15.82) 2. Marital History - Currently Married  Review of Systems Genitourinary, constitutional, skin, eye, otolaryngeal, hematologic/lymphatic, cardiovascular, pulmonary, endocrine, musculoskeletal, gastrointestinal, neurological and psychiatric system(s) were reviewed and pertinent findings if present are noted.  Genitourinary: erectile dysfunction, but no dysuria, no nocturia, no difficulty starting the urinary stream, urine stream is not weak and no hematuria.  Gastrointestinal: constipation, but no nausea, no abdominal pain and no heartburn.  Hematologic/Lymphatic: a tendency to easily bruise.  Respiratory: shortness of breath and shortness of breath during exertion.    Vitals Blood Pressure: 100 / 59 Temperature: 97.1 F Heart Rate: 75  Well-developed well-nourished male in no acute distress Respiratory: Normal effort Cardiac: Regular rate and rhythm Abdomen: Soft nontender no CVA tenderness no obvious palpable mass Extremities: No tenderness or  edema  Assessment Assessed  1. Ureteral calculus (592.1) 2. Nephrolithiasis (592.0) 3. Microscopic hematuria (599.72)  Plan Microscopic hematuria  1. AU CT-STONE PROTOCOL; Status:Hold For - Appointment,PreCert,Date of Service,Print;  Requested for:20Jul2015;  2. Follow-up Month x 3 Office  Follow-up  Status: Hold For - Appointment,Date of Service   Requested for: 20Jul2015  Discussion/Summary    To the best of my ability to tell, Mr Bonebrake still has a distal right ureteral stone. He has been completely asymptomatic for months but has had some persistent microhematuria and on KUB there is still a suspicious calcification in the right hemipelvis. I explained to Mr Serpe last time that usually if a stone does not pass within a couple of months, it is typically recommended that we go ahead and intervene, so that that stone does not become problematic down the road. He is of the mindset if he is not having any problems, he would rather not have anything done and I certainly understand that philosophy. He was without hydronephrosis last month and therefore there is very little to no chance that this stone is currently damaging his kidney. Obviously things could change and he understands that if he elects not to have anything done, that it would be important that he be checked periodically for silent hydronephrosis. At this point we have agreed to get him back in 3 months. I am going to go ahead with a repeat stone protocol CT so I can be certain that this calcification is indeed in the ureter, and if it is, continue to give him options. If he becomes clinically symptomatic between now and then, I have asked him to call me and at that point I would strongly recommend intervention.

## 2013-10-10 ENCOUNTER — Encounter (HOSPITAL_BASED_OUTPATIENT_CLINIC_OR_DEPARTMENT_OTHER)
Admission: RE | Disposition: A | Discharge status: Home / Self Care | Payer: Self-pay | Source: Ambulatory Visit | Attending: Urology

## 2013-10-10 ENCOUNTER — Encounter (HOSPITAL_BASED_OUTPATIENT_CLINIC_OR_DEPARTMENT_OTHER): Payer: Self-pay | Admitting: Anesthesiology

## 2013-10-10 ENCOUNTER — Ambulatory Visit (HOSPITAL_BASED_OUTPATIENT_CLINIC_OR_DEPARTMENT_OTHER): Payer: Medicare Other | Admitting: Anesthesiology

## 2013-10-10 ENCOUNTER — Encounter (HOSPITAL_BASED_OUTPATIENT_CLINIC_OR_DEPARTMENT_OTHER): Payer: Medicare Other | Admitting: Anesthesiology

## 2013-10-10 ENCOUNTER — Ambulatory Visit (HOSPITAL_BASED_OUTPATIENT_CLINIC_OR_DEPARTMENT_OTHER)
Admission: RE | Admit: 2013-10-10 | Discharge: 2013-10-10 | Disposition: A | Discharge status: Home / Self Care | Payer: Medicare Other | Source: Ambulatory Visit | Attending: Urology | Admitting: Urology

## 2013-10-10 DIAGNOSIS — I10 Essential (primary) hypertension: Secondary | ICD-10-CM | POA: Insufficient documentation

## 2013-10-10 DIAGNOSIS — IMO0002 Reserved for concepts with insufficient information to code with codable children: Secondary | ICD-10-CM | POA: Diagnosis not present

## 2013-10-10 DIAGNOSIS — J4489 Other specified chronic obstructive pulmonary disease: Secondary | ICD-10-CM | POA: Insufficient documentation

## 2013-10-10 DIAGNOSIS — J449 Chronic obstructive pulmonary disease, unspecified: Secondary | ICD-10-CM | POA: Diagnosis not present

## 2013-10-10 DIAGNOSIS — Z87891 Personal history of nicotine dependence: Secondary | ICD-10-CM | POA: Insufficient documentation

## 2013-10-10 DIAGNOSIS — Z79899 Other long term (current) drug therapy: Secondary | ICD-10-CM | POA: Diagnosis not present

## 2013-10-10 DIAGNOSIS — N201 Calculus of ureter: Secondary | ICD-10-CM | POA: Insufficient documentation

## 2013-10-10 DIAGNOSIS — E7800 Pure hypercholesterolemia, unspecified: Secondary | ICD-10-CM | POA: Diagnosis not present

## 2013-10-10 HISTORY — DX: Hyperlipidemia, unspecified: E78.5

## 2013-10-10 HISTORY — DX: Occlusion and stenosis of left carotid artery: I65.22

## 2013-10-10 HISTORY — DX: Emphysema, unspecified: J43.9

## 2013-10-10 HISTORY — PX: HOLMIUM LASER APPLICATION: SHX5852

## 2013-10-10 HISTORY — DX: Atrioventricular block, first degree: I44.0

## 2013-10-10 HISTORY — DX: Calculus of ureter: N20.1

## 2013-10-10 HISTORY — PX: CYSTOSCOPY WITH RETROGRADE PYELOGRAM, URETEROSCOPY AND STENT PLACEMENT: SHX5789

## 2013-10-10 HISTORY — DX: Presence of cardiac and vascular implant and graft, unspecified: Z95.9

## 2013-10-10 HISTORY — DX: Headache: R51

## 2013-10-10 HISTORY — DX: Occlusion and stenosis of right vertebral artery: I65.01

## 2013-10-10 HISTORY — DX: Unspecified hearing loss, unspecified ear: H91.90

## 2013-10-10 LAB — POCT I-STAT 4, (NA,K, GLUC, HGB,HCT)
Glucose, Bld: 101 mg/dL — ABNORMAL HIGH (ref 70–99)
HEMATOCRIT: 42 % (ref 39.0–52.0)
HEMOGLOBIN: 14.3 g/dL (ref 13.0–17.0)
Potassium: 3.8 mEq/L (ref 3.7–5.3)
SODIUM: 143 meq/L (ref 137–147)

## 2013-10-10 SURGERY — CYSTOURETEROSCOPY, WITH RETROGRADE PYELOGRAM AND STENT INSERTION
Anesthesia: General | Site: Ureter | Laterality: Right

## 2013-10-10 MED ORDER — MIDAZOLAM HCL 5 MG/5ML IJ SOLN
INTRAMUSCULAR | Status: DC | PRN
  Administered 2013-10-10: 2 mg via INTRAVENOUS

## 2013-10-10 MED ORDER — TAMSULOSIN HCL 0.4 MG PO CAPS
0.4000 mg | ORAL_CAPSULE | Freq: Every day | ORAL | Status: DC
  Administered 2013-10-10: 0.4 mg via ORAL
  Filled 2013-10-10: qty 1

## 2013-10-10 MED ORDER — CIPROFLOXACIN IN D5W 400 MG/200ML IV SOLN
INTRAVENOUS | Status: AC
  Filled 2013-10-10: qty 200

## 2013-10-10 MED ORDER — TAMSULOSIN HCL 0.4 MG PO CAPS
ORAL_CAPSULE | ORAL | Status: AC
  Filled 2013-10-10: qty 1

## 2013-10-10 MED ORDER — DEXAMETHASONE SODIUM PHOSPHATE 10 MG/ML IJ SOLN
INTRAMUSCULAR | Status: DC | PRN
  Administered 2013-10-10: 10 mg via INTRAVENOUS

## 2013-10-10 MED ORDER — OXYCODONE HCL 5 MG PO TABS
ORAL_TABLET | ORAL | Status: AC
  Filled 2013-10-10: qty 1

## 2013-10-10 MED ORDER — ONDANSETRON HCL 4 MG/2ML IJ SOLN
INTRAMUSCULAR | Status: DC | PRN
Start: 2013-10-10 — End: 2013-10-10
  Administered 2013-10-10: 4 mg via INTRAVENOUS

## 2013-10-10 MED ORDER — PROPOFOL INFUSION 10 MG/ML OPTIME
INTRAVENOUS | Status: DC | PRN
Start: 2013-10-10 — End: 2013-10-10
  Administered 2013-10-10: 120 mL via INTRAVENOUS
  Administered 2013-10-10: 80 mL via INTRAVENOUS

## 2013-10-10 MED ORDER — ACETAMINOPHEN 10 MG/ML IV SOLN
INTRAVENOUS | Status: DC | PRN
  Administered 2013-10-10: 1000 mg via INTRAVENOUS

## 2013-10-10 MED ORDER — LACTATED RINGERS IV SOLN
INTRAVENOUS | Status: DC
  Filled 2013-10-10: qty 1000

## 2013-10-10 MED ORDER — LIDOCAINE HCL (CARDIAC) 20 MG/ML IV SOLN
INTRAVENOUS | Status: DC | PRN
  Administered 2013-10-10: 60 mg via INTRAVENOUS

## 2013-10-10 MED ORDER — FENTANYL CITRATE 0.05 MG/ML IJ SOLN
INTRAMUSCULAR | Status: DC | PRN
  Administered 2013-10-10: 50 ug via INTRAVENOUS
  Administered 2013-10-10: 25 ug via INTRAVENOUS

## 2013-10-10 MED ORDER — FENTANYL CITRATE 0.05 MG/ML IJ SOLN
25.0000 ug | INTRAMUSCULAR | Status: DC | PRN
  Filled 2013-10-10: qty 1

## 2013-10-10 MED ORDER — CEFAZOLIN SODIUM-DEXTROSE 2-3 GM-% IV SOLR
2.0000 g | INTRAVENOUS | Status: AC
  Administered 2013-10-10: 2 g via INTRAVENOUS
  Filled 2013-10-10: qty 50

## 2013-10-10 MED ORDER — OXYCODONE HCL 5 MG PO TABS
5.0000 mg | ORAL_TABLET | Freq: Once | ORAL | Status: AC
  Administered 2013-10-10: 5 mg via ORAL
  Filled 2013-10-10: qty 1

## 2013-10-10 MED ORDER — CEFAZOLIN SODIUM-DEXTROSE 2-3 GM-% IV SOLR
INTRAVENOUS | Status: AC
  Filled 2013-10-10: qty 50

## 2013-10-10 MED ORDER — FENTANYL CITRATE 0.05 MG/ML IJ SOLN
INTRAMUSCULAR | Status: AC
  Filled 2013-10-10: qty 4

## 2013-10-10 MED ORDER — EPHEDRINE SULFATE 50 MG/ML IJ SOLN
INTRAMUSCULAR | Status: DC | PRN
  Administered 2013-10-10: 5 mg via INTRAVENOUS
  Administered 2013-10-10: 15 mg via INTRAVENOUS

## 2013-10-10 MED ORDER — MIDAZOLAM HCL 2 MG/2ML IJ SOLN
INTRAMUSCULAR | Status: AC
  Filled 2013-10-10: qty 2

## 2013-10-10 MED ORDER — LACTATED RINGERS IV SOLN
INTRAVENOUS | Status: DC
  Administered 2013-10-10: 10:00:00 via INTRAVENOUS
  Filled 2013-10-10: qty 1000

## 2013-10-10 MED ORDER — LIDOCAINE HCL 2 % EX GEL
CUTANEOUS | Status: DC | PRN
  Administered 2013-10-10: 1 via URETHRAL

## 2013-10-10 MED ORDER — SODIUM CHLORIDE 0.9 % IR SOLN
Status: DC | PRN
Start: 2013-10-10 — End: 2013-10-10
  Administered 2013-10-10: 3000 mL via INTRAVESICAL

## 2013-10-10 SURGICAL SUPPLY — 29 items
APL SKNCLS STERI-STRIP NONHPOA (GAUZE/BANDAGES/DRESSINGS)
BAG DRAIN URO-CYSTO SKYTR STRL (DRAIN) ×3 IMPLANT
BAG DRN UROCATH (DRAIN) ×1
BASKET ZERO TIP NITINOL 2.4FR (BASKET) ×4 IMPLANT
BENZOIN TINCTURE PRP APPL 2/3 (GAUZE/BANDAGES/DRESSINGS) IMPLANT
BSKT STON RTRVL ZERO TP 2.4FR (BASKET) ×2
CANISTER SUCT LVC 12 LTR MEDI- (MISCELLANEOUS) ×2 IMPLANT
CATH INTERMIT  6FR 70CM (CATHETERS) ×2 IMPLANT
CATH URET 5FR 28IN OPEN ENDED (CATHETERS) IMPLANT
CLOTH BEACON ORANGE TIMEOUT ST (SAFETY) ×3 IMPLANT
DRAPE CAMERA CLOSED 9X96 (DRAPES) ×3 IMPLANT
DRSG TEGADERM 2-3/8X2-3/4 SM (GAUZE/BANDAGES/DRESSINGS) IMPLANT
FIBER LASER FLEXIVA 365 (UROLOGICAL SUPPLIES) ×2 IMPLANT
GLOVE BIO SURGEON STRL SZ 6.5 (GLOVE) ×1 IMPLANT
GLOVE BIO SURGEON STRL SZ7.5 (GLOVE) ×3 IMPLANT
GLOVE BIO SURGEONS STRL SZ 6.5 (GLOVE) ×1
GLOVE INDICATOR 6.5 STRL GRN (GLOVE) ×4 IMPLANT
GOWN STRL REUS W/ TWL LRG LVL3 (GOWN DISPOSABLE) IMPLANT
GOWN STRL REUS W/ TWL XL LVL3 (GOWN DISPOSABLE) IMPLANT
GOWN STRL REUS W/TWL LRG LVL3 (GOWN DISPOSABLE) ×3
GOWN STRL REUS W/TWL XL LVL3 (GOWN DISPOSABLE) ×3
GUIDEWIRE STR DUAL SENSOR (WIRE) ×2 IMPLANT
IV NS 1000ML (IV SOLUTION) ×3
IV NS 1000ML BAXH (IV SOLUTION) IMPLANT
IV NS IRRIG 3000ML ARTHROMATIC (IV SOLUTION) ×4 IMPLANT
NS IRRIG 500ML POUR BTL (IV SOLUTION) IMPLANT
PACK CYSTOSCOPY (CUSTOM PROCEDURE TRAY) ×3 IMPLANT
SHEATH URET ACCESS 12FR/35CM (UROLOGICAL SUPPLIES) ×2 IMPLANT
STENT URET 6FRX22 CONTOUR (STENTS) ×2 IMPLANT

## 2013-10-10 NOTE — Anesthesia Postprocedure Evaluation (Signed)
  Anesthesia Post-op Note  Patient: Douglas Monroe  Procedure(s) Performed: Procedure(s) (LRB): uretheral dilation, CYSTOSCOPY, URETEROSCOPY, stone extraction, STENT PLACEMENT (Right) HOLMIUM LASER APPLICATION (Right)  Patient Location: PACU  Anesthesia Type: General  Level of Consciousness: awake and alert   Airway and Oxygen Therapy: Patient Spontanous Breathing  Post-op Pain: mild  Post-op Assessment: Post-op Vital signs reviewed, Patient's Cardiovascular Status Stable, Respiratory Function Stable, Patent Airway and No signs of Nausea or vomiting  Last Vitals:  Filed Vitals:   10/10/13 1154  BP: 123/62  Pulse: 63  Temp: 36.1 C  Resp: 20    Post-op Vital Signs: stable   Complications: No apparent anesthesia complications

## 2013-10-10 NOTE — Anesthesia Procedure Notes (Signed)
Procedure Name: LMA Insertion Date/Time: 10/10/2013 10:56 AM Performed by: Wanita Chamberlain Pre-anesthesia Checklist: Patient identified, Timeout performed, Emergency Drugs available, Suction available and Patient being monitored Patient Re-evaluated:Patient Re-evaluated prior to inductionOxygen Delivery Method: Circle system utilized Preoxygenation: Pre-oxygenation with 100% oxygen Intubation Type: IV induction Ventilation: Mask ventilation without difficulty LMA: LMA inserted LMA Size: 4.0 Number of attempts: 1 Placement Confirmation: positive ETCO2 Tube secured with: Tape Dental Injury: Teeth and Oropharynx as per pre-operative assessment

## 2013-10-10 NOTE — Discharge Instructions (Addendum)
[facility] [phone] Post Ureteroscopy With or Without Stent Instructions  Definitions:  Ureter: The duct that transports urine from the kidney to the bladder. Stent:   A plastic hollow tube that is placed into the ureter, from the kidney to the                 bladder to prevent the ureter from swelling shut.  GENERAL INSTRUCTIONS:  Despite the fact that no skin incisions were used, the area around the ureter and bladder is raw and irritated. The stent is a foreign body which will further irritate the bladder wall. This irritation is manifested by increased frequency of urination, both day and night, and by an increase in the urge to urinate. In some, the urge to urinate is present almost always. Sometimes the urge is strong enough that you may not be able to stop yourself from urinating. The only real cure is to remove the stent and then give time for the bladder wall to heal which can't be done until the danger of the ureter swelling shut has passed, which varies.  You may see some blood in your urine while the stent is in place and a few days afterwards. Do not be alarmed, even if the urine was clear for a while. Get off your feet and drink lots of fluids until clearing occurs. If you start to pass clots or don't improve, call us.  DIET: You may return to your normal diet immediately. Because of the raw surface of your bladder, alcohol, spicy foods, acid type foods and drinks with caffeine may cause irritation or frequency and should be used in moderation. To keep your urine flowing freely and to avoid constipation, drink plenty of fluids during the day ( 8-10 glasses ). Tip: Avoid cranberry juice because it is very acidic.  ACTIVITY: Your physical activity doesn't need to be restricted. However, if you are very active, you may see some blood in your urine. We suggest that you reduce your activity under these circumstances until the bleeding has stopped.  BOWELS: It is  important to keep your bowels regular during the postoperative period. Straining with bowel movements can cause bleeding. A bowel movement every other day is reasonable. Use a mild laxative if needed, such as Milk of Magnesia 2-3 tablespoons, or 2 Dulcolax tablets. Call if you continue to have problems. If you have been taking narcotics for pain, before, during or after your surgery, you may be constipated. Take a laxative if necessary.   MEDICATION: You should resume your pre-surgery medications unless told not to. In addition you will often be given an antibiotic to prevent infection. These should be taken as prescribed until the bottles are finished unless you are having an unusual reaction to one of the drugs.  PROBLEMS YOU SHOULD REPORT TO [place]:  Fevers over 100.5 Fahrenheit.  Heavy bleeding, or clots ( See above notes about blood in urine ).  Inability to urinate.  Drug reactions ( hives, rash, nausea, vomiting, diarrhea ).  Severe burning or pain with urination that is not improving.  FOLLOW-UP: You will need a follow-up appointment to monitor your progress. Call for this appointment at the number listed above. Usually the first appointment will be about three to fourteen days after your surgery.   He may restart his aspirin in 3 days.     Post Anesthesia Home Care Instructions  Activity: Get plenty of rest for the remainder of the day. A responsible adult should stay  with you for 24 hours following the procedure.  For the next 24 hours, DO NOT: -Drive a car -Paediatric nurse -Drink alcoholic beverages -Take any medication unless instructed by your physician -Make any legal decisions or sign important papers.  Meals: Start with liquid foods such as gelatin or soup. Progress to regular foods as tolerated. Avoid greasy, spicy, heavy foods. If nausea and/or vomiting occur, drink only clear liquids until the nausea and/or vomiting subsides. Call your physician if vomiting  continues.  Special Instructions/Symptoms: Your throat may feel dry or sore from the anesthesia or the breathing tube placed in your throat during surgery. If this causes discomfort, gargle with warm salt water. The discomfort should disappear within 24 hours.

## 2013-10-10 NOTE — Interval H&P Note (Signed)
History and Physical Interval Note:  10/10/2013 10:29 AM  Douglas Monroe  has presented today for surgery, with the diagnosis of RIGHT URETERAL CALCULUS  The various methods of treatment have been discussed with the patient and family. After consideration of risks, benefits and other options for treatment, the patient has consented to  Procedure(s): CYSTOSCOPY WITH RETROGRADE PYELOGRAM, URETEROSCOPY AND POSSIBLE STENT PLACEMENT (Right) HOLMIUM LASER APPLICATION (Right) as a surgical intervention .  The patient's history has been reviewed, patient examined, no change in status, stable for surgery.  I have reviewed the patient's chart and labs.  Questions were answered to the patient's satisfaction.     Exie Chrismer S

## 2013-10-10 NOTE — Op Note (Signed)
Preoperative diagnosis: 5 mm right distal ureteral calculus Postoperative diagnosis: Same/ meatal stenosis  Procedure: Meatal/urethral dilation, cystoscopy, right ureteroscopy, holmium laser lithotripsy, basketing of stone fragments and right double-J stent placement 22 CM x6 French  Surgeon: [name] M.D.  Anesthesia: Gen.  Indications: Mr. Douglas Monroe is 79 years of age. Several months ago he was diagnosed with a 5 mm stone in his right proximal ureter. He has continued to be observed now for several months but has not passed the stone spontaneously. He has continued to have microhematuria. He has not had any significant hydronephrosis but is had continued presence of a 4-5 mm calcification in his right hemipelvis now for approximately 4-5 months. Given the lack of spontaneous passage we have recommended intervention. Risks benefits discussed with him.     Technique and findings: Patient was brought the operating room where he had successful induction general anesthesia. He was placed in lithotomy position and prepped and draped usual manner. He received perioperative antibiotics and placement of PAS compression boots. Appropriate surgical timeout was performed.   The patient had evidence of meatal stenosis and therefore required dilation from 70 [place] to 24 [place] cystoscopy was then performed. The patient had significant trilobar hyperplasia with visual obstruction and a fairly significant middle lobe. Bladder otherwise showed significant trabecular change with multiple cellules. On fluoroscopy the stone could be appreciated therefore retrograde pyelogram was felt not to be necessary. Guidewire was placed in the right ureter up to the right renal pelvis.   The right ureter was engaged with a 6 [place] rigid scope. The intermural ureter was very tight. We ended up having to do one step dilation with an access sheath. A 4-5 mm stone was encountered freely floating in the distal right ureter. Holmium  laser lithotripter was used at 0.8 J x8 Hz and the stone was fractured into approximately half a dozen pieces. Largest fragments were basket extracted.   A 6 French x22 cm double-J stent was then placed over the guidewire. Good positioning confirmed. Patient was brought to recovery room his stable condition having had no obvious complications.

## 2013-10-10 NOTE — Transfer of Care (Signed)
Immediate Anesthesia Transfer of Care Note  Patient: Douglas Monroe  Procedure(s) Performed: Procedure(s): uretheral dilation, CYSTOSCOPY, URETEROSCOPY, stone extraction, STENT PLACEMENT (Right) HOLMIUM LASER APPLICATION (Right)  Patient Location: PACU  Anesthesia Type:General  Level of Consciousness: awake, alert , oriented and patient cooperative  Airway & Oxygen Therapy: Patient Spontanous Breathing and Patient connected to nasal cannula oxygen  Post-op Assessment: Report given to PACU RN and Post -op Vital signs reviewed and stable  Post vital signs: Reviewed and stable  Complications: No apparent anesthesia complications

## 2013-10-10 NOTE — Anesthesia Preprocedure Evaluation (Addendum)
Anesthesia Evaluation  Patient identified by MRN, date of birth, ID band Patient awake    Reviewed: Allergy & Precautions, H&P , NPO status , Patient's Chart, lab work & pertinent test results, reviewed documented beta blocker date and time   Airway Mallampati: III TM Distance: >3 FB Neck ROM: full    Dental no notable dental hx. (+) Edentulous Lower, Upper Dentures, Dental Advisory Given   Pulmonary shortness of breath, sleep apnea , COPD COPD inhaler, former smoker,  Stop bang 6 breath sounds clear to auscultation  Pulmonary exam normal       Cardiovascular hypertension, Pt. on medications and Pt. on home beta blockers + Peripheral Vascular Disease + dysrhythmias Rhythm:regular Rate:Normal  4.1 cm AAA monitored   Neuro/Psych  Headaches, Carotid stent negative psych ROS   GI/Hepatic negative GI ROS, Neg liver ROS, GERD-  Medicated,  Endo/Other  negative endocrine ROS  Renal/GU Renal diseaseRenal artery stent  negative genitourinary   Musculoskeletal  (+) Arthritis -, Osteoarthritis,    Abdominal   Peds  Hematology negative hematology ROS (+)   Anesthesia Other Findings   Reproductive/Obstetrics negative OB ROS                       Anesthesia Physical Anesthesia Plan  ASA: III  Anesthesia Plan: General   Post-op Pain Management:    Induction: Intravenous  Airway Management Planned: LMA  Additional Equipment:   Intra-op Plan:   Post-operative Plan:   Informed Consent: I have reviewed the patients History and Physical, chart, labs and discussed the procedure including the risks, benefits and alternatives for the proposed anesthesia with the patient or authorized representative who has indicated his/her understanding and acceptance.   Dental Advisory Given and Dental advisory given  Plan Discussed with: CRNA and Surgeon  Anesthesia Plan Comments:        Anesthesia Quick  Evaluation

## 2013-10-15 ENCOUNTER — Encounter (HOSPITAL_BASED_OUTPATIENT_CLINIC_OR_DEPARTMENT_OTHER): Payer: Self-pay | Admitting: Urology

## 2013-10-16 ENCOUNTER — Ambulatory Visit (HOSPITAL_COMMUNITY)
Admission: RE | Admit: 2013-10-16 | Discharge: 2013-10-16 | Disposition: A | Discharge status: Home / Self Care | Payer: Medicare Other | Source: Ambulatory Visit | Attending: Internal Medicine | Admitting: Internal Medicine

## 2013-10-16 DIAGNOSIS — I6529 Occlusion and stenosis of unspecified carotid artery: Secondary | ICD-10-CM | POA: Insufficient documentation

## 2013-10-16 NOTE — Progress Notes (Signed)
Carotid Duplex Completed. °Brianna L Mazza,RVT °

## 2013-10-18 ENCOUNTER — Encounter: Payer: Self-pay | Admitting: *Deleted

## 2013-10-29 ENCOUNTER — Telehealth: Payer: Self-pay | Admitting: Cardiovascular Disease

## 2013-10-29 MED ORDER — CLOPIDOGREL BISULFATE 75 MG PO TABS: 75.0000 mg | ORAL_TABLET | Freq: Every day | ORAL | Status: DC

## 2013-10-29 MED ORDER — ATORVASTATIN CALCIUM 20 MG PO TABS: 20.0000 mg | ORAL_TABLET | Freq: Every day | ORAL | Status: DC

## 2013-10-29 NOTE — Telephone Encounter (Signed)
Pt's wife called in wanting to get Douglas Monroe prescriptions for clopedigrel and atorvastatin filled for a 90 days supply. Please call to let her know if this will be covered by insurance  Thanks

## 2013-10-29 NOTE — Telephone Encounter (Signed)
Rx was sent to pharmacy electronically. Patient notified.  

## 2014-01-10 ENCOUNTER — Encounter (HOSPITAL_COMMUNITY): Payer: Self-pay | Admitting: Cardiovascular Disease

## 2014-01-31 ENCOUNTER — Encounter (HOSPITAL_COMMUNITY): Payer: Self-pay

## 2014-01-31 ENCOUNTER — Emergency Department (HOSPITAL_COMMUNITY)
Admission: EM | Admit: 2014-01-31 | Discharge: 2014-01-31 | Disposition: A | Discharge status: Home / Self Care | Payer: Medicare Other | Attending: Emergency Medicine | Admitting: Emergency Medicine

## 2014-01-31 ENCOUNTER — Emergency Department (HOSPITAL_COMMUNITY): Payer: Medicare Other

## 2014-01-31 DIAGNOSIS — Z9889 Other specified postprocedural states: Secondary | ICD-10-CM | POA: Insufficient documentation

## 2014-01-31 DIAGNOSIS — E785 Hyperlipidemia, unspecified: Secondary | ICD-10-CM | POA: Insufficient documentation

## 2014-01-31 DIAGNOSIS — Z79899 Other long term (current) drug therapy: Secondary | ICD-10-CM | POA: Insufficient documentation

## 2014-01-31 DIAGNOSIS — Z8669 Personal history of other diseases of the nervous system and sense organs: Secondary | ICD-10-CM | POA: Diagnosis not present

## 2014-01-31 DIAGNOSIS — I10 Essential (primary) hypertension: Secondary | ICD-10-CM | POA: Diagnosis not present

## 2014-01-31 DIAGNOSIS — J441 Chronic obstructive pulmonary disease with (acute) exacerbation: Secondary | ICD-10-CM | POA: Diagnosis not present

## 2014-01-31 DIAGNOSIS — Z87891 Personal history of nicotine dependence: Secondary | ICD-10-CM | POA: Insufficient documentation

## 2014-01-31 DIAGNOSIS — R0602 Shortness of breath: Secondary | ICD-10-CM | POA: Diagnosis present

## 2014-01-31 DIAGNOSIS — M19042 Primary osteoarthritis, left hand: Secondary | ICD-10-CM | POA: Insufficient documentation

## 2014-01-31 DIAGNOSIS — Z87442 Personal history of urinary calculi: Secondary | ICD-10-CM | POA: Insufficient documentation

## 2014-01-31 DIAGNOSIS — K219 Gastro-esophageal reflux disease without esophagitis: Secondary | ICD-10-CM | POA: Insufficient documentation

## 2014-01-31 DIAGNOSIS — M19041 Primary osteoarthritis, right hand: Secondary | ICD-10-CM | POA: Insufficient documentation

## 2014-01-31 LAB — CBC
HCT: 41.4 % (ref 39.0–52.0)
HEMOGLOBIN: 13.7 g/dL (ref 13.0–17.0)
MCH: 31.4 pg (ref 26.0–34.0)
MCHC: 33.1 g/dL (ref 30.0–36.0)
MCV: 94.7 fL (ref 78.0–100.0)
Platelets: 150 10*3/uL (ref 150–400)
RBC: 4.37 MIL/uL (ref 4.22–5.81)
RDW: 13.8 % (ref 11.5–15.5)
WBC: 7 10*3/uL (ref 4.0–10.5)

## 2014-01-31 LAB — I-STAT TROPONIN, ED: Troponin i, poc: 0 ng/mL (ref 0.00–0.08)

## 2014-01-31 LAB — BASIC METABOLIC PANEL
ANION GAP: 9 (ref 5–15)
BUN: 18 mg/dL (ref 6–23)
CALCIUM: 9.5 mg/dL (ref 8.4–10.5)
CHLORIDE: 111 meq/L (ref 96–112)
CO2: 22 mmol/L (ref 19–32)
Creatinine, Ser: 1.24 mg/dL (ref 0.50–1.35)
GFR calc Af Amer: 62 mL/min — ABNORMAL LOW (ref >90)
GFR calc non Af Amer: 54 mL/min — ABNORMAL LOW (ref >90)
Glucose, Bld: 101 mg/dL — ABNORMAL HIGH (ref 70–99)
POTASSIUM: 3.9 mmol/L (ref 3.5–5.1)
Sodium: 142 mmol/L (ref 135–145)

## 2014-01-31 MED ORDER — ONDANSETRON HCL 4 MG/2ML IJ SOLN
4.0000 mg | Freq: Once | INTRAMUSCULAR | Status: AC
  Administered 2014-01-31: 4 mg via INTRAVENOUS
  Filled 2014-01-31: qty 2

## 2014-01-31 MED ORDER — ALBUTEROL SULFATE (2.5 MG/3ML) 0.083% IN NEBU
5.0000 mg | INHALATION_SOLUTION | Freq: Once | RESPIRATORY_TRACT | Status: AC
  Administered 2014-01-31: 5 mg via RESPIRATORY_TRACT
  Filled 2014-01-31: qty 6

## 2014-01-31 MED ORDER — PREDNISONE 10 MG PO TABS: 20.0000 mg | ORAL_TABLET | Freq: Two times a day (BID) | ORAL | Status: DC

## 2014-01-31 MED ORDER — IPRATROPIUM BROMIDE 0.02 % IN SOLN
0.5000 mg | Freq: Once | RESPIRATORY_TRACT | Status: AC
  Administered 2014-01-31: 0.5 mg via RESPIRATORY_TRACT
  Filled 2014-01-31: qty 2.5

## 2014-01-31 MED ORDER — DEXTROMETHORPHAN HBR 15 MG/5ML PO SYRP: 10.0000 mL | ORAL_SOLUTION | Freq: Four times a day (QID) | ORAL | Status: DC | PRN

## 2014-01-31 MED ORDER — AZITHROMYCIN 250 MG PO TABS: ORAL_TABLET | ORAL | Status: DC

## 2014-01-31 MED ORDER — METHYLPREDNISOLONE SODIUM SUCC 125 MG IJ SOLR
125.0000 mg | Freq: Once | INTRAMUSCULAR | Status: AC
  Administered 2014-01-31: 125 mg via INTRAVENOUS
  Filled 2014-01-31: qty 2

## 2014-01-31 MED ORDER — IPRATROPIUM-ALBUTEROL 0.5-2.5 (3) MG/3ML IN SOLN
3.0000 mL | Freq: Once | RESPIRATORY_TRACT | Status: DC
  Filled 2014-01-31: qty 3

## 2014-01-31 NOTE — ED Provider Notes (Signed)
CSN: [id number]     Arrival date & time 01/31/14  6440 History   First MD Initiated Contact with Patient 01/31/14 [phone]     Chief Complaint  Patient presents with  . Shortness of Breath     (Consider location/radiation/quality/duration/timing/severity/associated sxs/prior Treatment) HPI Comments: Patient is a 80 year old male with history of COPD. He presents with complaints of wheezing and difficulty breathing that started last night. He states he was around his granddaughter who is ill with a viral illness. Last night his throat began feeling scratchy and developed laryngitis. Since that time he has had wheezing. He has had little relief with his inhalers. He denies any fevers or chills. He denies to me he is experiencing any productive cough.  Patient is a 80 y.o. male presenting with shortness of breath. The history is provided by the patient.  Shortness of Breath Severity:  Moderate Onset quality:  Sudden Duration:  1 day Timing:  Constant Progression:  Worsening Chronicity:  Recurrent Context: activity and URI   Relieved by:  Nothing Worsened by:  Nothing tried Ineffective treatments:  None tried   Past Medical History  Diagnosis Date  . Hypertension   . GERD (gastroesophageal reflux disease)   . HTN (hypertension) 08/20/2011  . Exertional dyspnea   . Arthritis     hands  . Hyperlipidemia   . COPD with emphysema   . Right ureteral stone   . First degree heart block   . PVD (peripheral vascular disease) with claudication cardiologist-  dr berry    s/p bilateral iliac stents and right sfa stenting/  doppper study 09-05-2012  revealed ABIs close to one bilaterally with patent stents/  stenting left renal artery stenosis   . Abdominal aortic aneurysm     infrarenal -- monitored by dr berry (note states stable 4.0 to 4.1cm)  . 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 Left carotid artery stenosis     moderate  . S/P arterial stent     left renal  angioplasty and stent 11-02-2012  . Headache(784.0)     uses goody powder  . HOH (hard of hearing)     left ear; no hearing aid   . Sleep apnea     at risk for OSA. stop/bang score= 6; sent to PCP 10/04/13   Past Surgical History  Procedure Laterality Date  . Soft tissue cyst excision  1960's    "outside part of lung LLL"  . Lumbar disc surgery  1970's  . Abdominal angiogram  08/19/2011    Left common iliac artery at the ostium, 6x97mm ICAST covered stent, common femoral artery, 8x136mm Abbott absolute pro nitinol self-expanding stent and resulting in reduction of 80 and 90% stenosis to 0% residual  . Abdominal angiogram  10/08/2010    Mid-right SFA, 6x120 EV3 Protege nitinol self-expanding stent, resulting in reduction of CTO to 0% residual  . Abdominal angiogram  06/22/2010    Rt Common Femoral Artery-8x3 Absolute Pro Nitinol self-expanding stent-90% stenosis to 0% residual; Rt External Iliac Artery-9x3 Absolute Pro Nitinol self-expanding stent-70% stenosis to 0% residual; Left External Iliac Artery-8x4 Absolute Pro Nitinol self-expanding stent-90% to 0% residual  . Renal artery stent Left 11/02/2012  . Cardiovascular stress test  06-12-2010   dr berry    normal perfusion study/ no ischemia/  ef 69%  . Cardiac catheterization  10/26/2002  dr Tami Ribas    normal coronary arteries/ normal  LVF  . Cystoscopy with retrograde pyelogram, ureteroscopy and stent placement  Right 10/10/2013    Procedure: uretheral dilation, CYSTOSCOPY, URETEROSCOPY, stone extraction, STENT PLACEMENT;  Surgeon: [name], MD;  Location: [facility];  Service: Urology;  Laterality: Right;  . Holmium laser application Right 0/05/5407    Procedure: HOLMIUM LASER APPLICATION;  Surgeon: [name], MD;  Location: [facility];  Service: Urology;  Laterality: Right;  . Atherectomy N/A 08/19/2011    Procedure: ATHERECTOMY;  Surgeon: Lorretta Harp, MD;  Location: [facility] CATH LAB;  Service:  Cardiovascular;  Laterality: N/A;  . Renal angiogram N/A 11/02/2012    Procedure: RENAL ANGIOGRAM;  Surgeon: Lorretta Harp, MD;  Location: [facility] CATH LAB;  Service: Cardiovascular;  Laterality: N/A;   Family History  Problem Relation Age of Onset  . Hypertension Mother   . Cancer Father   . Diabetes Brother   . Hypertension Son    History  Substance Use Topics  . Smoking status: Former Smoker -- 1.00 packs/day for 60 years    Types: Cigarettes    Quit date: 06/02/2010  . Smokeless tobacco: Never Used  . Alcohol Use: No    Review of Systems  Respiratory: Positive for shortness of breath.   All other systems reviewed and are negative.     Allergies  Pneumococcal vaccines  Home Medications   Prior to Admission medications   Medication Sig Start Date End Date Taking? Authorizing Provider  albuterol (PROVENTIL HFA;VENTOLIN HFA) 108 (90 BASE) MCG/ACT inhaler Inhale 2 puffs into the lungs every 6 (six) hours as needed for shortness of breath.     Historical Provider, MD  amLODipine (NORVASC) 5 MG tablet Take 5 mg by mouth every evening.    Historical Provider, MD  atorvastatin (LIPITOR) 20 MG tablet Take 1 tablet (20 mg total) by mouth daily. 10/29/13   Lorretta Harp, MD  clopidogrel (PLAVIX) 75 MG tablet Take 1 tablet (75 mg total) by mouth daily. 10/29/13   Lorretta Harp, MD  finasteride (PROSCAR) 5 MG tablet Take 5 mg by mouth every morning.    Historical Provider, MD  nebivolol (BYSTOLIC) 10 MG tablet Take 10 mg by mouth daily.    Historical Provider, MD  olmesartan (BENICAR) 40 MG tablet Take 40 mg by mouth every morning.    Historical Provider, MD  ondansetron (ZOFRAN ODT) 4 MG disintegrating tablet Take 1 tablet (4 mg total) by mouth every 8 (eight) hours as needed for nausea. 04/20/13   Larene Pickett, PA-C  oxyCODONE-acetaminophen (PERCOCET/ROXICET) 5-325 MG per tablet Take 1 tablet by mouth every 4 (four) hours as needed. 04/20/13   Larene Pickett, PA-C  ranitidine  (ZANTAC) 150 MG tablet Take 150 mg by mouth 2 (two) times daily as needed for heartburn.     Historical Provider, MD  tamsulosin (FLOMAX) 0.4 MG CAPS capsule Take 1 capsule by mouth daily. 07/13/13   Historical Provider, MD  tiotropium (SPIRIVA) 18 MCG inhalation capsule Place 18 mcg into inhaler and inhale daily.    Historical Provider, MD   BP 126/71 mmHg  Pulse 95  Temp(Src) 98 F (36.7 C) (Oral)  Resp 28  Ht 5\' 6"  (1.676 m)  Wt 149 lb 8 oz (67.813 kg)  BMI 24.14 kg/m2  SpO2 93% Physical Exam  Constitutional: He is oriented to person, place, and time. He appears well-developed and well-nourished. No distress.  HENT:  Head: Normocephalic and atraumatic.  Mouth/Throat: Oropharynx is clear and moist.  Neck: Normal range of motion. Neck supple.  Cardiovascular: Normal  rate, regular rhythm and normal heart sounds.   No murmur heard. Pulmonary/Chest: Effort normal. No respiratory distress. He has wheezes. He has no rales.  There are bilateral wheezes present with decreased air movement.  Abdominal: Soft. Bowel sounds are normal. He exhibits no distension. There is no tenderness.  Musculoskeletal: Normal range of motion. He exhibits no edema.  Lymphadenopathy:    He has no cervical adenopathy.  Neurological: He is alert and oriented to person, place, and time.  Skin: Skin is warm and dry. He is not diaphoretic.  Nursing note and vitals reviewed.   ED Course  Procedures (including critical care time) Labs Review Labs Reviewed  BASIC METABOLIC PANEL  [place], ED    Imaging Review No results found.   EKG Interpretation   Date/Time:  Thursday January 31 2014 05:00:43 EST Ventricular Rate:  96 PR Interval:  221 QRS Duration: 99 QT Interval:  341 QTC Calculation: 431 R Axis:   -69 Text Interpretation:  Age not entered, assumed to be  80 years old for  purpose of ECG interpretation Sinus rhythm Prolonged PR interval LAD,  consider LAFB or inferior infarct  Anterior infarct, old Confirmed by DELOS   MD, Medardo Hassing ([id number]) on 01/31/2014 5:26:27 AM      MDM   Final diagnoses:  None    Patient presents with complaints of difficulty breathing and cough. He has a history of COPD and the Spears to be bronchitis with COPD exacerbation. He is feeling better with nebulizer treatments and steroids and I believe is appropriate for discharge. He will be treated with Zithromax, prednisone, and cough syrup. He has inhalers to use at home and will discuss a home nebulizer with his primary Dr. in follow-up. His EKG is unchanged and troponin is negative and I highly doubt a cardiac etiology.    Veryl Speak, MD 01/31/14 [phone]

## 2014-01-31 NOTE — ED Notes (Addendum)
Pt reports he has COPD and is having trouble breathing that started last night. Pt speaking in clear, complete sentences. RR-28, expiratory wheezing. A&OX4. O2-93 RA.

## 2014-01-31 NOTE — Discharge Instructions (Signed)
Zithromax as prescribed.  Prednisone as prescribed.  Robitussin with codeine as prescribed as needed for cough.  Return to the emergency department if you develop severe chest pain, difficulty breathing, or other new and concerning symptoms.   Chronic Obstructive Pulmonary Disease Exacerbation Chronic obstructive pulmonary disease (COPD) is a common lung condition in which airflow from the lungs is limited. COPD is a general term that can be used to describe many different lung problems that limit airflow, including chronic bronchitis and emphysema. COPD exacerbations are episodes when breathing symptoms become much worse and require extra treatment. Without treatment, COPD exacerbations can be life threatening, and frequent COPD exacerbations can cause further damage to your lungs. CAUSES   Respiratory infections.   Exposure to smoke.   Exposure to air pollution, chemical fumes, or dust. Sometimes there is no apparent cause or trigger. RISK FACTORS  Smoking cigarettes.  Older age.  Frequent prior COPD exacerbations. SIGNS AND SYMPTOMS   Increased coughing.   Increased thick spit (sputum) production.   Increased wheezing.   Increased shortness of breath.   Rapid breathing.   Chest tightness. DIAGNOSIS  Your medical history, a physical exam, and tests will help your health care provider make a diagnosis. Tests may include:  A chest X-ray.  Basic lab tests.  Sputum testing.  An arterial blood gas test. TREATMENT  Depending on the severity of your COPD exacerbation, you may need to be admitted to a hospital for treatment. Some of the treatments commonly used to treat COPD exacerbations are:   Antibiotic medicines.   Bronchodilators. These are drugs that expand the air passages. They may be given with an inhaler or nebulizer. Spacer devices may be needed to help improve drug delivery.  Corticosteroid medicines.  Supplemental oxygen therapy.  HOME CARE  INSTRUCTIONS   Do not smoke. Quitting smoking is very important to prevent COPD from getting worse and exacerbations from happening as often.  Avoid exposure to all substances that irritate the airway, especially to tobacco smoke.   If you were prescribed an antibiotic medicine, finish it all even if you start to feel better.  Take all medicines as directed by your health care provider.It is important to use correct technique with inhaled medicines.  Drink enough fluids to keep your urine clear or pale yellow (unless you have a medical condition that requires fluid restriction).  Use a cool mist vaporizer. This makes it easier to clear your chest when you cough.   If you have a home nebulizer and oxygen, continue to use them as directed.   Maintain all necessary vaccinations to prevent infections.   Exercise regularly.   Eat a healthy diet.   Keep all follow-up appointments as directed by your health care provider. SEEK IMMEDIATE MEDICAL CARE IF:  You have worsening shortness of breath.   You have trouble talking.   You have severe chest pain.  You have blood in your sputum.  You have a fever.  You have weakness, vomit repeatedly, or faint.   You feel confused.   You continue to get worse. MAKE SURE YOU:   Understand these instructions.  Will watch your condition.  Will get help right away if you are not doing well or get worse. Document Released: 11/15/2006 Document Revised: 06/04/2013 Document Reviewed: 09/22/2012 [facility] Patient Information 2015 [place], [place]. This information is not intended to replace advice given to you by your health care provider. Make sure you discuss any questions you have with your health care provider.

## 2014-03-07 ENCOUNTER — Other Ambulatory Visit (HOSPITAL_COMMUNITY): Payer: Self-pay | Admitting: Cardiovascular Disease

## 2014-03-07 DIAGNOSIS — I714 Abdominal aortic aneurysm, without rupture, unspecified: Secondary | ICD-10-CM

## 2014-04-19 ENCOUNTER — Ambulatory Visit (HOSPITAL_COMMUNITY)
Admission: RE | Admit: 2014-04-19 | Discharge: 2014-04-19 | Disposition: A | Discharge status: Home / Self Care | Payer: Medicare Other | Source: Ambulatory Visit | Attending: Cardiovascular Disease | Admitting: Cardiovascular Disease

## 2014-04-19 DIAGNOSIS — I714 Abdominal aortic aneurysm, without rupture, unspecified: Secondary | ICD-10-CM

## 2014-04-19 NOTE — Progress Notes (Signed)
Abdominal Aorta Completed. Stable infrarenal  AAA measuring approximately 4.4 x 4.0cm. Oda Cogan, BS, RDMS, RVT

## 2014-04-26 ENCOUNTER — Encounter: Payer: Self-pay | Admitting: *Deleted

## 2014-04-30 ENCOUNTER — Telehealth: Payer: Self-pay | Admitting: *Deleted

## 2014-04-30 NOTE — Telephone Encounter (Signed)
Pa form for a tier exception for clopidogrel faxed to [employer] [place]

## 2014-05-01 NOTE — Telephone Encounter (Signed)
Spoke to Gae Bon Psychologist, forensic) She states the CLOPIDOGREL has been approved from 04/29/14 to 04/29/15

## 2014-05-15 ENCOUNTER — Other Ambulatory Visit (HOSPITAL_COMMUNITY): Payer: Self-pay | Admitting: Cardiovascular Disease

## 2014-05-15 DIAGNOSIS — I701 Atherosclerosis of renal artery: Secondary | ICD-10-CM

## 2014-05-16 ENCOUNTER — Encounter (HOSPITAL_COMMUNITY): Payer: Self-pay | Admitting: *Deleted

## 2014-05-28 ENCOUNTER — Ambulatory Visit (HOSPITAL_COMMUNITY)
Admission: RE | Admit: 2014-05-28 | Discharge: 2014-05-28 | Disposition: A | Discharge status: Home / Self Care | Payer: Medicare Other | Source: Ambulatory Visit | Attending: Cardiology | Admitting: Cardiology

## 2014-05-28 DIAGNOSIS — I701 Atherosclerosis of renal artery: Secondary | ICD-10-CM | POA: Insufficient documentation

## 2014-05-28 NOTE — Progress Notes (Signed)
Renal artery duplex completed.  °Brianna L Mazza,RVT °

## 2014-07-11 ENCOUNTER — Encounter (HOSPITAL_COMMUNITY): Payer: Self-pay | Admitting: Emergency Medicine

## 2014-07-11 ENCOUNTER — Emergency Department (HOSPITAL_COMMUNITY)
Admission: EM | Admit: 2014-07-11 | Discharge: 2014-07-11 | Disposition: A | Discharge status: Home / Self Care | Payer: Medicare Other | Source: Home / Self Care | Attending: Family Medicine | Admitting: Family Medicine

## 2014-07-11 DIAGNOSIS — S0532XA Ocular laceration without prolapse or loss of intraocular tissue, left eye, initial encounter: Secondary | ICD-10-CM | POA: Diagnosis not present

## 2014-07-11 MED ORDER — ERYTHROMYCIN 5 MG/GM OP OINT: TOPICAL_OINTMENT | OPHTHALMIC | Status: DC

## 2014-07-11 MED ORDER — TETRACAINE HCL 0.5 % OP SOLN
OPHTHALMIC | Status: AC
Start: 2014-07-11 — End: 2014-07-11
  Filled 2014-07-11: qty 2

## 2014-07-11 MED ORDER — FLUORESCEIN SODIUM 1 MG OP STRP
ORAL_STRIP | OPHTHALMIC | Status: AC
  Filled 2014-07-11: qty 1

## 2014-07-11 NOTE — ED Provider Notes (Signed)
Douglas Monroe is a 81 y.o. male who presents to Urgent Care today for left eye injury. Patient was using a lawnmower today when something hit him in his left eye. He denies any blurry vision but does note mild eye pain. No fevers or chills nausea vomiting or diarrhea. No treatment tried yet.   Past Medical History  Diagnosis Date  . Hypertension   . GERD (gastroesophageal reflux disease)   . HTN (hypertension) 08/20/2011  . Exertional dyspnea   . Arthritis     hands  . Hyperlipidemia   . COPD with emphysema   . Right ureteral stone   . First degree heart block   . PVD (peripheral vascular disease) with claudication cardiologist-  dr berry    s/p bilateral iliac stents and right sfa stenting/  doppper study 09-05-2012  revealed ABIs close to one bilaterally with patent stents/  stenting left renal artery stenosis   . Abdominal aortic aneurysm     infrarenal -- monitored by dr berry (note states stable 4.0 to 4.1cm)  . 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 Left carotid artery stenosis     moderate  . S/P arterial stent     left renal angioplasty and stent 11-02-2012  . Headache(784.0)     uses goody powder  . HOH (hard of hearing)     left ear; no hearing aid   . Sleep apnea     at risk for OSA. stop/bang score= 6; sent to PCP 10/04/13   Past Surgical History  Procedure Laterality Date  . Soft tissue cyst excision  1960's    "outside part of lung LLL"  . Lumbar disc surgery  1970's  . Abdominal angiogram  08/19/2011    Left common iliac artery at the ostium, 6x63mm ICAST covered stent, common femoral artery, 8x121mm Abbott absolute pro nitinol self-expanding stent and resulting in reduction of 80 and 90% stenosis to 0% residual  . Abdominal angiogram  10/08/2010    Mid-right SFA, 6x120 EV3 Protege nitinol self-expanding stent, resulting in reduction of CTO to 0% residual  . Abdominal angiogram  06/22/2010    Rt Common Femoral Artery-8x3 Absolute Pro  Nitinol self-expanding stent-90% stenosis to 0% residual; Rt External Iliac Artery-9x3 Absolute Pro Nitinol self-expanding stent-70% stenosis to 0% residual; Left External Iliac Artery-8x4 Absolute Pro Nitinol self-expanding stent-90% to 0% residual  . Renal artery stent Left 11/02/2012  . Cardiovascular stress test  06-12-2010   dr berry    normal perfusion study/ no ischemia/  ef 69%  . Cardiac catheterization  10/26/2002  dr Tami Ribas    normal coronary arteries/ normal  LVF  . Cystoscopy with retrograde pyelogram, ureteroscopy and stent placement Right 10/10/2013    Procedure: uretheral dilation, CYSTOSCOPY, URETEROSCOPY, stone extraction, STENT PLACEMENT;  Surgeon: [name], MD;  Location: [facility];  Service: Urology;  Laterality: Right;  . Holmium laser application Right 10/08/6732    Procedure: HOLMIUM LASER APPLICATION;  Surgeon: [name], MD;  Location: [facility];  Service: Urology;  Laterality: Right;  . Atherectomy N/A 08/19/2011    Procedure: ATHERECTOMY;  Surgeon: Lorretta Harp, MD;  Location: [facility] CATH LAB;  Service: Cardiovascular;  Laterality: N/A;  . Renal angiogram N/A 11/02/2012    Procedure: RENAL ANGIOGRAM;  Surgeon: Lorretta Harp, MD;  Location: [facility] CATH LAB;  Service: Cardiovascular;  Laterality: N/A;   History  Substance Use Topics  . Smoking status: Former Smoker --  1.00 packs/day for 60 years    Types: Cigarettes    Quit date: 06/02/2010  . Smokeless tobacco: Never Used  . Alcohol Use: No   ROS as above Medications: No current facility-administered medications for this encounter.   Current Outpatient Prescriptions  Medication Sig Dispense Refill  . amLODipine (NORVASC) 5 MG tablet Take 5 mg by mouth every evening.    [name] atorvastatin (LIPITOR) 20 MG tablet Take 1 tablet (20 mg total) by mouth daily. 90 tablet 2  . clopidogrel (PLAVIX) 75 MG tablet Take 1 tablet (75 mg total) by mouth daily. 90 tablet 2  . nebivolol  (BYSTOLIC) 10 MG tablet Take 10 mg by mouth daily.    . ranitidine (ZANTAC) 150 MG tablet Take 150 mg by mouth 2 (two) times daily as needed for heartburn.     [name] albuterol (PROVENTIL HFA;VENTOLIN HFA) 108 (90 BASE) MCG/ACT inhaler Inhale 2 puffs into the lungs every 6 (six) hours as needed for shortness of breath.     [name] azithromycin (ZITHROMAX Z-PAK) 250 MG tablet 2 po day one, then 1 daily x 4 days 6 tablet 0  . dextromethorphan 15 MG/5ML syrup Take 10 mLs (30 mg total) by mouth 4 (four) times daily as needed for cough. 120 mL 0  . finasteride (PROSCAR) 5 MG tablet Take 5 mg by mouth every morning.    . olmesartan (BENICAR) 40 MG tablet Take 40 mg by mouth every morning.    . ondansetron (ZOFRAN ODT) 4 MG disintegrating tablet Take 1 tablet (4 mg total) by mouth every 8 (eight) hours as needed for nausea. (Patient not taking: Reported on 01/31/2014) 10 tablet 0  . oxyCODONE-acetaminophen (PERCOCET/ROXICET) 5-325 MG per tablet Take 1 tablet by mouth every 4 (four) hours as needed. (Patient not taking: Reported on 01/31/2014) 15 tablet 0  . predniSONE (DELTASONE) 10 MG tablet Take 2 tablets (20 mg total) by mouth 2 (two) times daily. 20 tablet 0  . STUDY MEDICATION Inhale 1 ampule into the lungs daily. New Study Medication from [facility]    . tamsulosin (FLOMAX) 0.4 MG CAPS capsule Take 1 capsule by mouth daily.    [name] tiotropium (SPIRIVA) 18 MCG inhalation capsule Place 18 mcg into inhaler and inhale daily.     Allergies  Allergen Reactions  . Pneumococcal Vaccines Swelling    Lip swelling     Exam:  BP 129/73 mmHg  Pulse 70  Temp(Src) 97.7 F (36.5 C) (Oral)  Resp 16  SpO2 95% Gen: Well NAD HEENT: EOMI,  MMM right eye is normal. Left eye medial sclerae red. Small amount of blood present Cornea is normal-appearing. PERRLA bilaterally. Pain-free range of motion. Fluorescein: No fluorescein uptake. Negative Seidel sign Lungs: Normal work of breathing. CTABL Heart: RRR no MRG Abd: NABS,  Soft. Nondistended, Nontender Exts: Brisk capillary refill, warm and well perfused.   No results found for this or any previous visit (from the past 24 hour(s)). No results found.  Assessment and Plan: 81 y.o. male with left eye conjunctiva laceration no penetration of the globe. Discussed with on-call ophthalmology. Erythromycin ointment follow-up with ophthalmology tomorrow  Discussed warning signs or symptoms. Please see discharge instructions. Patient expresses understanding.     Gregor Hams, MD 07/11/14 [phone]

## 2014-07-11 NOTE — Discharge Instructions (Signed)
Thank you for coming in today. Follow up with the eye doctors tomorrow.  Use the eye ointment.  Return as needed.  Go to the emergency room if you get worse.

## 2014-07-11 NOTE — ED Notes (Signed)
Reports inj to left eye since this am States he was mowing his lawn when something flung and hit him  Sx include irritation, watery, redness and blurry vision Alert, of acute distress.

## 2014-07-22 ENCOUNTER — Other Ambulatory Visit: Payer: Self-pay | Admitting: Cardiovascular Disease

## 2014-07-24 ENCOUNTER — Telehealth: Payer: Self-pay | Admitting: Cardiovascular Disease

## 2014-07-24 MED ORDER — CLOPIDOGREL BISULFATE 75 MG PO TABS: 75.0000 mg | ORAL_TABLET | Freq: Every day | ORAL | Status: DC

## 2014-07-24 MED ORDER — ATORVASTATIN CALCIUM 20 MG PO TABS: 20.0000 mg | ORAL_TABLET | Freq: Every day | ORAL | Status: DC

## 2014-07-24 NOTE — Telephone Encounter (Signed)
°  1. Which medications need to be refilled? Atorvastatin and Clopidogrel-please call today 2. Which pharmacy is medication to be sent to?[id number]  3. Do they need a 30 day or 90 day supply? #90 and refills  4. Would they like a call back once the medication has been sent to the pharmacy? no

## 2014-07-24 NOTE — Telephone Encounter (Signed)
Atorvastatin and Clopidogrel refill sent to pharmacy

## 2014-07-31 ENCOUNTER — Ambulatory Visit (INDEPENDENT_AMBULATORY_CARE_PROVIDER_SITE_OTHER): Payer: Medicare Other | Admitting: Cardiovascular Disease

## 2014-07-31 ENCOUNTER — Encounter: Payer: Self-pay | Admitting: Cardiovascular Disease

## 2014-07-31 VITALS — BP 120/64 | HR 57 | Ht 66.5 in | Wt 152.3 lb

## 2014-07-31 DIAGNOSIS — I739 Peripheral vascular disease, unspecified: Secondary | ICD-10-CM | POA: Diagnosis not present

## 2014-07-31 DIAGNOSIS — Z9889 Other specified postprocedural states: Secondary | ICD-10-CM | POA: Diagnosis not present

## 2014-07-31 DIAGNOSIS — I10 Essential (primary) hypertension: Secondary | ICD-10-CM | POA: Diagnosis not present

## 2014-07-31 DIAGNOSIS — E785 Hyperlipidemia, unspecified: Secondary | ICD-10-CM | POA: Diagnosis not present

## 2014-07-31 DIAGNOSIS — I779 Disorder of arteries and arterioles, unspecified: Secondary | ICD-10-CM

## 2014-07-31 DIAGNOSIS — Z959 Presence of cardiac and vascular implant and graft, unspecified: Secondary | ICD-10-CM

## 2014-07-31 NOTE — Assessment & Plan Note (Signed)
History of hypertension with blood pressure measured to 120/64. He is on amlodipine, by systolic, and Diovan. Continue current meds at current dosing

## 2014-07-31 NOTE — Patient Instructions (Addendum)
Dr Berry recommends that you schedule a follow-up appointment in 1 year. You will receive a reminder letter in the mail two months in advance. If you don't receive a letter, please call our office to schedule the follow-up appointment. 

## 2014-07-31 NOTE — Assessment & Plan Note (Addendum)
History of renal artery stenosis status post PTA and stenting of his left renal artery 11/02/12 for renal preservation with a 6 mm x 12 mm long balloon expandable stent. His most recent renal Doppler study performed 05/28/14 suggested that this was widely patent.we are Following this on an annual basis.

## 2014-07-31 NOTE — Progress Notes (Signed)
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 He stopped smoking over a year ago and has COPD, hypertension and hyperlipidemia. He had normal coronary arteries by cath performed by Dr. Tami Ribas in 2004 and normal LV function. I stented both of his iliacs in the past, as well as recanalized his right SFA. He did have an incidentally noted 80% left renal artery stenosis which we have been following by duplex ultrasound and has remained stable, as well as moderate left internal carotid artery stenosis. We follow this as well. He is neurologically asymptomatic. He did have recurrent claudication with a drop in his left ABI and reangiogram which revealed in-stent restenosis with progression of disease. I restented his left iliac ostium with an iCAST covered stent and then stented his entire left external iliac artery with an Absolute Pro Nitinol self-expanding stent (8 x 100 mm.) His symptoms resolved and his Dopplers normalized. He no longer has claudication. He denies chest pain or shortness of breath. His most recent lipid profile performed in August revealed total cholesterol 112, LDL 62 and HDL 35.  Since I saw him in the office 02/16/12 he has remained stable. He denies chest pain, shortness of breath or claudication. This will Dopplers performed 09/12/12 didn't show a progression of the left renal artery stenosis. Based on this we decided to proceed with percutaneous intervention for renal preservation. This was performed in 11/02/12 at which time I placed a 6 mm x 12 mm long balloon expandable stent in the ostium of his left renal  artery which was 80% on angiography reduced to 0% residual. At the time of angiography to document a small abdominal aortic aneurysm which we are following up with ultrasound as well as patent stents in his common and external iliac arteries bilaterally. Since I saw him back in the office 11/29/12 his remained stable. He denies chest pain, increasing shortness of breath or claudication. He does have COPD and stopped smoking back in 2012. His most recent renal Dopplers performed in April revealed a widely patent left renal artery stent. His abdominal aortic aneurysm measures 4.4 cm which we are following semiannually. His lower extremity Dopplers performed last year revealed ABIs of 0.87 bilaterally with patent stents. His carotid Dopplers performed last year showed a mild left ICA stenosis.  Current Outpatient Prescriptions  Medication Sig Dispense Refill  . albuterol (PROVENTIL HFA;VENTOLIN HFA) 108 (90 BASE) MCG/ACT inhaler Inhale 2 puffs into the lungs every 6 (six) hours as needed for shortness of breath.     [name] amLODipine (NORVASC) 5 MG tablet Take 5 mg by mouth every evening.    [name] atorvastatin (LIPITOR) 20 MG tablet Take 1 tablet (20 mg total) by mouth daily. 90 tablet 0  . clopidogrel (PLAVIX) 75 MG tablet Take 1 tablet (75 mg total) by mouth daily. 90 tablet 0  . finasteride (PROSCAR) 5 MG tablet Take 5 mg by mouth every morning.    . nebivolol (BYSTOLIC) 10 MG tablet Take 10 mg by mouth daily.    . prednisoLONE acetate (PRED FORTE) 1 % ophthalmic suspension Place 1 drop into the left eye daily.  1  .  ranitidine (ZANTAC) 150 MG tablet Take 150 mg by mouth 2 (two) times daily as needed for heartburn.     . STUDY MEDICATION Inhale 1 ampule into the lungs daily. New Study Medication from [facility]    . tamsulosin (FLOMAX) 0.4 MG CAPS capsule Take 1 capsule by mouth daily.    [name] tiotropium (SPIRIVA) 18 MCG inhalation capsule Place 18 mcg into inhaler and inhale daily.    . valsartan (DIOVAN) 160  MG tablet Take 1 tablet by mouth daily.  0   No current facility-administered medications for this visit.    Allergies  Allergen Reactions  . Pneumococcal Vaccines Swelling    Lip swelling    History   Social History  . Marital Status: Married    Spouse Name: N/A  . Number of Children: N/A  . Years of Education: N/A   Occupational History  . Not on file.   Social History Main Topics  . Smoking status: Former Smoker -- 1.00 packs/day for 60 years    Types: Cigarettes    Quit date: 06/02/2010  . Smokeless tobacco: Never Used  . Alcohol Use: No  . Drug Use: No  . Sexual Activity: Not on file   Other Topics Concern  . Not on file   Social History Narrative     Review of Systems: General: negative for chills, fever, night sweats or weight changes.  Cardiovascular: negative for chest pain, dyspnea on exertion, edema, orthopnea, palpitations, paroxysmal nocturnal dyspnea or shortness of breath Dermatological: negative for rash Respiratory: negative for cough or wheezing Urologic: negative for hematuria Abdominal: negative for nausea, vomiting, diarrhea, bright red blood per rectum, melena, or hematemesis Neurologic: negative for visual changes, syncope, or dizziness All other systems reviewed and are otherwise negative except as noted above.    Blood pressure 120/64, pulse 57, height 5' 6.5" (1.689 m), weight 152 lb 4.8 oz (69.083 kg).  General appearance: alert and no distress Neck: no adenopathy, no JVD, supple, symmetrical, trachea midline, thyroid not enlarged, symmetric, no tenderness/mass/nodules and soft left carotid bruit Lungs: clear to auscultation bilaterally Heart: regular rate and rhythm, S1, S2 normal, no murmur, click, rub or gallop Extremities: extremities normal, atraumatic, no cyanosis or edema  EKG sinus bradycardia at 57 with left axis deviation, low limb voltage. I personally reviewed this EKG  ASSESSMENT AND PLAN:   S/P arterial stent, to Lt  renal art. 11/02/12 History of renal artery stenosis status post PTA and stenting of his left renal artery 11/02/12 for renal preservation with a 6 mm x 12 mm long balloon expandable stent. His most recent renal Doppler study performed 05/28/14 suggested that this was widely patent.we are Following this on an annual basis.  PVD (peripheral vascular disease) with claudication, previous stents to bilateral iliacs. Rt. SFA also.  History of peripheral arterial disease status post bilateral iliac stenting as well as right SFA stenting with recent intervention on his left common iliac artery ostium for "in-stent restenosis using an ICast  covered stent as well as stenting of his entire left external iliac artery using a absolute Pro nitinol self-expanding stent (8 mm x 100 mm). His follow-up lower extremity arterial Doppler studies performed 09/13/13 revealed ABIs of 0.87 bilaterally with widely patent stents. He denies claudication.  Hyperlipidemia History of hyperlipidemia on atorvastatin 20 mg a day followed by his PCP  HTN (hypertension) History of hypertension with blood pressure measured to 120/64. He is on amlodipine, by systolic, and Diovan. Continue current meds at current  dosing  Carotid artery disease History of carotid artery disease with Dopplers performed 10/16/13 revealing mild left ICA stenosis. We are following this on a annual basis. He is neurologically asymptomatic.      [name] FACP,FACC,FAHA, [facility] 07/31/2014 12:26 PM

## 2014-07-31 NOTE — Assessment & Plan Note (Signed)
History of peripheral arterial disease status post bilateral iliac stenting as well as right SFA stenting with recent intervention on his left common iliac artery ostium for "in-stent restenosis using an ICast  covered stent as well as stenting of his entire left external iliac artery using a absolute Pro nitinol self-expanding stent (8 mm x 100 mm). His follow-up lower extremity arterial Doppler studies performed 09/13/13 revealed ABIs of 0.87 bilaterally with widely patent stents. He denies claudication.

## 2014-07-31 NOTE — Assessment & Plan Note (Signed)
History of carotid artery disease with Dopplers performed 10/16/13 revealing mild left ICA stenosis. We are following this on a annual basis. He is neurologically asymptomatic.

## 2014-07-31 NOTE — Assessment & Plan Note (Signed)
History of hyperlipidemia on atorvastatin 20 mg a day followed by his PCP 

## 2014-10-16 ENCOUNTER — Other Ambulatory Visit: Payer: Self-pay | Admitting: Cardiovascular Disease

## 2014-10-17 NOTE — Telephone Encounter (Signed)
Rx(s) sent to pharmacy electronically.  

## 2014-10-24 ENCOUNTER — Other Ambulatory Visit: Payer: Self-pay | Admitting: Cardiovascular Disease

## 2015-02-02 DIAGNOSIS — C44309 Unspecified malignant neoplasm of skin of other parts of face: Secondary | ICD-10-CM

## 2015-02-02 HISTORY — PX: SKIN CANCER EXCISION: SHX779

## 2015-02-02 HISTORY — DX: Unspecified malignant neoplasm of skin of other parts of face: C44.309

## 2015-02-05 DIAGNOSIS — K146 Glossodynia: Secondary | ICD-10-CM | POA: Diagnosis not present

## 2015-02-05 DIAGNOSIS — N183 Chronic kidney disease, stage 3 (moderate): Secondary | ICD-10-CM | POA: Diagnosis not present

## 2015-02-05 DIAGNOSIS — I2510 Atherosclerotic heart disease of native coronary artery without angina pectoris: Secondary | ICD-10-CM | POA: Diagnosis not present

## 2015-02-05 DIAGNOSIS — R079 Chest pain, unspecified: Secondary | ICD-10-CM | POA: Diagnosis not present

## 2015-02-05 DIAGNOSIS — J0100 Acute maxillary sinusitis, unspecified: Secondary | ICD-10-CM | POA: Diagnosis not present

## 2015-02-05 DIAGNOSIS — R7309 Other abnormal glucose: Secondary | ICD-10-CM | POA: Diagnosis not present

## 2015-02-05 DIAGNOSIS — I1310 Hypertensive heart and chronic kidney disease without heart failure, with stage 1 through stage 4 chronic kidney disease, or unspecified chronic kidney disease: Secondary | ICD-10-CM | POA: Diagnosis not present

## 2015-02-14 ENCOUNTER — Other Ambulatory Visit: Payer: Self-pay

## 2015-02-14 MED ORDER — CLOPIDOGREL BISULFATE 75 MG PO TABS: 75.0000 mg | ORAL_TABLET | Freq: Every day | ORAL | Status: DC

## 2015-02-14 MED ORDER — ATORVASTATIN CALCIUM 20 MG PO TABS: 20.0000 mg | ORAL_TABLET | Freq: Every day | ORAL | Status: DC

## 2015-02-14 NOTE — Telephone Encounter (Signed)
Rx(s) sent to pharmacy electronically.  

## 2015-02-19 ENCOUNTER — Other Ambulatory Visit: Payer: Self-pay | Admitting: *Deleted

## 2015-02-19 MED ORDER — ATORVASTATIN CALCIUM 20 MG PO TABS: 20.0000 mg | ORAL_TABLET | Freq: Every day | ORAL | Status: DC

## 2015-02-19 MED ORDER — CLOPIDOGREL BISULFATE 75 MG PO TABS: 75.0000 mg | ORAL_TABLET | Freq: Every day | ORAL | Status: DC

## 2015-02-20 ENCOUNTER — Other Ambulatory Visit: Payer: Self-pay | Admitting: Cardiovascular Disease

## 2015-02-20 MED ORDER — CLOPIDOGREL BISULFATE 75 MG PO TABS: 75.0000 mg | ORAL_TABLET | Freq: Every day | ORAL | Status: DC

## 2015-02-20 MED ORDER — ATORVASTATIN CALCIUM 20 MG PO TABS: 20.0000 mg | ORAL_TABLET | Freq: Every day | ORAL | Status: DC

## 2015-02-20 NOTE — Telephone Encounter (Signed)
°*  STAT* If patient is at the pharmacy, call can be transferred to refill team.   1. Which medications need to be refilled? (please list name of each medication and dose if known) Atorvastatin and Clopidogrel-changing pharmacy  2. Which pharmacy/location (including street and city if local pharmacy) is medication to be sent to?[facility]  3. Do they need a 30 day or 90 day supply? 90 and refills

## 2015-03-24 DIAGNOSIS — R3129 Other microscopic hematuria: Secondary | ICD-10-CM | POA: Diagnosis not present

## 2015-03-24 DIAGNOSIS — Z0000 Encounter for general adult medical examination without abnormal findings: Secondary | ICD-10-CM | POA: Diagnosis not present

## 2015-03-24 DIAGNOSIS — N200 Calculus of kidney: Secondary | ICD-10-CM | POA: Diagnosis not present

## 2015-04-23 DIAGNOSIS — J441 Chronic obstructive pulmonary disease with (acute) exacerbation: Secondary | ICD-10-CM | POA: Diagnosis not present

## 2015-06-12 ENCOUNTER — Other Ambulatory Visit: Payer: Self-pay | Admitting: Cardiovascular Disease

## 2015-06-12 DIAGNOSIS — I701 Atherosclerosis of renal artery: Secondary | ICD-10-CM

## 2015-06-12 DIAGNOSIS — I10 Essential (primary) hypertension: Secondary | ICD-10-CM

## 2015-06-18 ENCOUNTER — Ambulatory Visit (HOSPITAL_COMMUNITY)
Admission: RE | Admit: 2015-06-18 | Discharge: 2015-06-18 | Disposition: A | Discharge status: Home / Self Care | Payer: Medicare Other | Source: Ambulatory Visit | Attending: Cardiovascular Disease | Admitting: Cardiovascular Disease

## 2015-06-18 DIAGNOSIS — I701 Atherosclerosis of renal artery: Secondary | ICD-10-CM | POA: Diagnosis not present

## 2015-06-18 DIAGNOSIS — E785 Hyperlipidemia, unspecified: Secondary | ICD-10-CM | POA: Diagnosis not present

## 2015-06-18 DIAGNOSIS — Z87891 Personal history of nicotine dependence: Secondary | ICD-10-CM | POA: Insufficient documentation

## 2015-06-18 DIAGNOSIS — K551 Chronic vascular disorders of intestine: Secondary | ICD-10-CM | POA: Insufficient documentation

## 2015-06-18 DIAGNOSIS — I10 Essential (primary) hypertension: Secondary | ICD-10-CM | POA: Diagnosis not present

## 2015-06-18 DIAGNOSIS — I739 Peripheral vascular disease, unspecified: Secondary | ICD-10-CM | POA: Diagnosis not present

## 2015-06-18 DIAGNOSIS — J449 Chronic obstructive pulmonary disease, unspecified: Secondary | ICD-10-CM | POA: Insufficient documentation

## 2015-06-18 DIAGNOSIS — N281 Cyst of kidney, acquired: Secondary | ICD-10-CM | POA: Insufficient documentation

## 2015-06-26 ENCOUNTER — Telehealth: Payer: Self-pay | Admitting: Cardiovascular Disease

## 2015-06-26 NOTE — Telephone Encounter (Signed)
Results called to pt. Pt verbalized understanding.  Notes Recorded by Lorretta Harp, MD on 06/19/2015 at 1:30 PM Return office visit with me to discuss results at next available

## 2015-06-26 NOTE — Telephone Encounter (Signed)
Calling to get results from test done on 06/18/15. Please call   Thanks

## 2015-07-08 DIAGNOSIS — J441 Chronic obstructive pulmonary disease with (acute) exacerbation: Secondary | ICD-10-CM | POA: Diagnosis not present

## 2015-07-09 ENCOUNTER — Other Ambulatory Visit: Payer: Self-pay | Admitting: Cardiovascular Disease

## 2015-07-09 NOTE — Telephone Encounter (Signed)
Rx request sent to pharmacy.  

## 2015-07-25 DIAGNOSIS — X32XXXA Exposure to sunlight, initial encounter: Secondary | ICD-10-CM | POA: Diagnosis not present

## 2015-07-25 DIAGNOSIS — L821 Other seborrheic keratosis: Secondary | ICD-10-CM | POA: Diagnosis not present

## 2015-07-25 DIAGNOSIS — D225 Melanocytic nevi of trunk: Secondary | ICD-10-CM | POA: Diagnosis not present

## 2015-07-25 DIAGNOSIS — D0439 Carcinoma in situ of skin of other parts of face: Secondary | ICD-10-CM | POA: Diagnosis not present

## 2015-07-25 DIAGNOSIS — L570 Actinic keratosis: Secondary | ICD-10-CM | POA: Diagnosis not present

## 2015-07-30 ENCOUNTER — Encounter: Payer: Self-pay | Admitting: Cardiovascular Disease

## 2015-07-30 ENCOUNTER — Ambulatory Visit (INDEPENDENT_AMBULATORY_CARE_PROVIDER_SITE_OTHER): Payer: Medicare Other | Admitting: Cardiovascular Disease

## 2015-07-30 VITALS — BP 110/62 | HR 60 | Ht 66.0 in | Wt 155.0 lb

## 2015-07-30 DIAGNOSIS — I714 Abdominal aortic aneurysm, without rupture, unspecified: Secondary | ICD-10-CM

## 2015-07-30 DIAGNOSIS — I701 Atherosclerosis of renal artery: Secondary | ICD-10-CM | POA: Diagnosis not present

## 2015-07-30 DIAGNOSIS — I739 Peripheral vascular disease, unspecified: Secondary | ICD-10-CM | POA: Diagnosis not present

## 2015-07-30 DIAGNOSIS — E785 Hyperlipidemia, unspecified: Secondary | ICD-10-CM

## 2015-07-30 DIAGNOSIS — Z959 Presence of cardiac and vascular implant and graft, unspecified: Secondary | ICD-10-CM

## 2015-07-30 DIAGNOSIS — I10 Essential (primary) hypertension: Secondary | ICD-10-CM | POA: Diagnosis not present

## 2015-07-30 NOTE — Assessment & Plan Note (Signed)
Status post remote bilateral iliac stenting and right SFA intervention. I re-intervened on his left iliac ostium because of restenosis with an ICast covered stent followed by an absolute Pro nitinol  self expanding stent which resolved his symptoms and verbalizes Doppler studies. He denies claudication. We will get lower extra arterial Doppler studies

## 2015-07-30 NOTE — Patient Instructions (Signed)
Medication Instructions:  Your physician recommends that you continue on your current medications as directed. Please refer to the Current Medication list given to you today.  Testing/Procedures: Your physician has requested that you have a lower extremity arterial doppler- During this test, ultrasound is used to evaluate arterial blood flow in the legs. Allow approximately one hour for this exam. NOW  Your physician has requested that you have an abdominal aorta duplex. During this test, an ultrasound is used to evaluate the aorta. Allow 30 minutes for this exam. Do not eat after midnight the day before and avoid carbonated beverages NOW  Your physician has requested that you have a renal artery duplex. During this test, an ultrasound is used to evaluate blood flow to the kidneys. Allow one hour for this exam. Do not eat after midnight the day before and avoid carbonated beverages. Take your medications as you usually do.  SCHEDULE FOR IN 6 MONTHS.    Follow-Up: Your physician wants you to follow-up in: Agenda. You will receive a reminder letter in the mail two months in advance. If you don't receive a letter, please call our office to schedule the follow-up appointment.   Any Other Special Instructions Will Be Listed Below (If Applicable).     If you need a refill on your cardiac medications before your next appointment, please call your pharmacy.

## 2015-07-30 NOTE — Assessment & Plan Note (Signed)
History of abdominal aortic aneurysm measuring 4.4 cm when last checked 04/19/14. We will recheck an abdominal ultrasound.

## 2015-07-30 NOTE — Assessment & Plan Note (Signed)
History of hyperlipidemia on statin therapy followed by his PCP 

## 2015-07-30 NOTE — Progress Notes (Signed)
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 He stopped smoking over a year ago and has COPD, hypertension and hyperlipidemia. He had normal coronary arteries by cath performed by Dr. Tami Ribas in 2004 and normal LV function. I stented both of his iliacs in the past, as well as recanalized his right SFA. He did have an incidentally noted 80% left renal artery stenosis which we have been following by duplex ultrasound and has remained stable, as well as moderate left internal carotid artery stenosis. We follow this as well. He is neurologically asymptomatic. He did have recurrent claudication with a drop in his left ABI and reangiogram which revealed in-stent restenosis with progression of disease. I restented his left iliac ostium with an iCAST covered stent and then stented his entire left external iliac artery with an Absolute Pro Nitinol self-expanding stent (8 x 100 mm.) His symptoms resolved and his Dopplers normalized. He no longer has claudication. He denies chest pain or shortness of breath. His most recent lipid profile performed in August revealed total cholesterol 112, LDL 62 and HDL 35.  Since I saw him in the office 02/16/12 he has remained stable. He denies chest pain, shortness of breath or claudication. This will Dopplers performed 09/12/12 didn't show a progression of the left renal artery stenosis. Based on this we decided to proceed with percutaneous intervention for renal preservation. This was performed in 11/02/12 at which time I placed a 6 mm x 12 mm long balloon expandable stent in the ostium of his left renal artery  which was 80% on angiography reduced to 0% residual. At the time of angiography to document a small abdominal aortic aneurysm which we are following up with ultrasound as well as patent stents in his common and external iliac arteries bilaterally. Since I saw him back in the office 11/29/12 his remained stable. He denies chest pain, increasing shortness of breath or claudication. He does have COPD and stopped smoking back in 2012. His most recent renal Dopplers performed in April revealed a widely patent left renal artery stent. His abdominal aortic aneurysm measures 4.4 cm which we are following semiannually. His lower extremity Dopplers performed last year revealed ABIs of 0.87 bilaterally with patent stents. His carotid Dopplers performed last year showed a mild left ICA stenosis. Since I saw him a year ago his remained stable. He does have some dyspnea on exertion probably from his COPD but denies chest pain or claudication.   Current Outpatient Prescriptions  Medication Sig Dispense Refill  . albuterol (PROVENTIL HFA;VENTOLIN HFA) 108 (90 BASE) MCG/ACT inhaler Inhale 2 puffs into the lungs every 6 (six) hours as needed for shortness of breath.     [name] amLODipine (NORVASC) 5 MG tablet Take 5 mg by mouth every evening.    [name] atorvastatin (LIPITOR) 20 MG tablet TAKE 1 TABLET BY MOUTH  DAILY AT 6 PM. 90 tablet 1  . clopidogrel (PLAVIX) 75 MG tablet Take 1 tablet by mouth  every day 90 tablet 1  . finasteride (PROSCAR) 5 MG tablet Take 5 mg by mouth every morning.    . ranitidine (ZANTAC) 150 MG tablet Take  150 mg by mouth 2 (two) times daily as needed for heartburn.     . tamsulosin (FLOMAX) 0.4 MG CAPS capsule Take 1 capsule by mouth daily.    [name] tiotropium (SPIRIVA) 18 MCG inhalation capsule Place 18 mcg into inhaler and inhale daily.    . valsartan (DIOVAN) 160 MG tablet Take 1 tablet by mouth daily.  0   No current facility-administered medications for this visit.    Allergies  Allergen  Reactions  . Pneumococcal Vaccines Swelling    Lip swelling    Social History   Social History  . Marital Status: Married    Spouse Name: N/A  . Number of Children: N/A  . Years of Education: N/A   Occupational History  . Not on file.   Social History Main Topics  . Smoking status: Former Smoker -- 1.00 packs/day for 60 years    Types: Cigarettes    Quit date: 06/02/2010  . Smokeless tobacco: Never Used  . Alcohol Use: No  . Drug Use: No  . Sexual Activity: Not on file   Other Topics Concern  . Not on file   Social History Narrative     Review of Systems: General: negative for chills, fever, night sweats or weight changes.  Cardiovascular: negative for chest pain, dyspnea on exertion, edema, orthopnea, palpitations, paroxysmal nocturnal dyspnea or shortness of breath Dermatological: negative for rash Respiratory: negative for cough or wheezing Urologic: negative for hematuria Abdominal: negative for nausea, vomiting, diarrhea, bright red blood per rectum, melena, or hematemesis Neurologic: negative for visual changes, syncope, or dizziness All other systems reviewed and are otherwise negative except as noted above.    Blood pressure 110/62, pulse 60, height 5\' 6"  (1.676 m), weight 155 lb (70.308 kg).  General appearance: alert and no distress Neck: no adenopathy, no carotid bruit, no JVD, supple, symmetrical, trachea midline and thyroid not enlarged, symmetric, no tenderness/mass/nodules Lungs: clear to auscultation bilaterally Heart: regular rate and rhythm, S1, S2 normal, no murmur, click, rub or gallop Extremities: extremities normal, atraumatic, no cyanosis or edema  EKG sinus rhythm at 60 with reverse R-wave progression and left anterior fascicular block. I personally reviewed this EKG  ASSESSMENT AND PLAN:   PVD (peripheral vascular disease) with claudication, previous stents to bilateral iliacs. Rt. SFA also.  Status post remote bilateral iliac stenting  and right SFA intervention. I re-intervened on his left iliac ostium because of restenosis with an ICast covered stent followed by an absolute Pro nitinol  self expanding stent which resolved his symptoms and verbalizes Doppler studies. He denies claudication. We will get lower extra arterial Doppler studies  HTN (hypertension) History of hypertension blood pressure measured 110/60.[name] He is on amlodipine and valsartan. Continue current meds at current dosing  S/P arterial stent, to Lt renal art. 11/02/12 History of renal artery stenosis status post left renal artery stenting 11/02/12 with recent Dopplers that suggest progression of disease performed 06/18/15. We will recheck renal Doppler studies in 6 months  Hyperlipidemia History of hyperlipidemia on statin therapy followed by his PCP  Abdominal aortic aneurysm History of abdominal aortic aneurysm measuring 4.4 cm when last checked 04/19/14. We will recheck an abdominal ultrasound.      [name] FACP,FACC,FAHA, [facility] 07/30/2015 1:53 PM

## 2015-07-30 NOTE — Assessment & Plan Note (Signed)
History of renal artery stenosis status post left renal artery stenting 11/02/12 with recent Dopplers that suggest progression of disease performed 06/18/15. We will recheck renal Doppler studies in 6 months

## 2015-07-30 NOTE — Assessment & Plan Note (Signed)
History of hypertension blood pressure measured 110/60.[name] He is on amlodipine and valsartan. Continue current meds at current dosing

## 2015-08-14 ENCOUNTER — Other Ambulatory Visit: Payer: Self-pay | Admitting: Cardiovascular Disease

## 2015-08-14 DIAGNOSIS — I739 Peripheral vascular disease, unspecified: Secondary | ICD-10-CM

## 2015-08-21 ENCOUNTER — Ambulatory Visit (HOSPITAL_COMMUNITY)
Admission: RE | Admit: 2015-08-21 | Discharge: 2015-08-21 | Disposition: A | Discharge status: Home / Self Care | Payer: Medicare Other | Source: Ambulatory Visit | Attending: Cardiovascular Disease | Admitting: Cardiovascular Disease

## 2015-08-21 DIAGNOSIS — I708 Atherosclerosis of other arteries: Secondary | ICD-10-CM | POA: Insufficient documentation

## 2015-08-21 DIAGNOSIS — E785 Hyperlipidemia, unspecified: Secondary | ICD-10-CM | POA: Insufficient documentation

## 2015-08-21 DIAGNOSIS — K219 Gastro-esophageal reflux disease without esophagitis: Secondary | ICD-10-CM | POA: Insufficient documentation

## 2015-08-21 DIAGNOSIS — R938 Abnormal findings on diagnostic imaging of other specified body structures: Secondary | ICD-10-CM | POA: Insufficient documentation

## 2015-08-21 DIAGNOSIS — J439 Emphysema, unspecified: Secondary | ICD-10-CM | POA: Diagnosis not present

## 2015-08-21 DIAGNOSIS — I714 Abdominal aortic aneurysm, without rupture, unspecified: Secondary | ICD-10-CM

## 2015-08-21 DIAGNOSIS — I10 Essential (primary) hypertension: Secondary | ICD-10-CM | POA: Diagnosis not present

## 2015-08-21 DIAGNOSIS — I701 Atherosclerosis of renal artery: Secondary | ICD-10-CM

## 2015-08-21 DIAGNOSIS — I739 Peripheral vascular disease, unspecified: Secondary | ICD-10-CM | POA: Diagnosis not present

## 2015-08-21 DIAGNOSIS — J449 Chronic obstructive pulmonary disease, unspecified: Secondary | ICD-10-CM | POA: Diagnosis not present

## 2015-08-21 DIAGNOSIS — I700 Atherosclerosis of aorta: Secondary | ICD-10-CM | POA: Insufficient documentation

## 2015-08-22 DIAGNOSIS — L570 Actinic keratosis: Secondary | ICD-10-CM | POA: Diagnosis not present

## 2015-08-22 DIAGNOSIS — Z85828 Personal history of other malignant neoplasm of skin: Secondary | ICD-10-CM | POA: Diagnosis not present

## 2015-08-22 DIAGNOSIS — Z08 Encounter for follow-up examination after completed treatment for malignant neoplasm: Secondary | ICD-10-CM | POA: Diagnosis not present

## 2015-08-22 DIAGNOSIS — X32XXXD Exposure to sunlight, subsequent encounter: Secondary | ICD-10-CM | POA: Diagnosis not present

## 2015-08-26 ENCOUNTER — Telehealth: Payer: Self-pay | Admitting: Cardiovascular Disease

## 2015-08-26 NOTE — Telephone Encounter (Signed)
That is true, there are no left SFA stents.

## 2015-08-26 NOTE — Telephone Encounter (Signed)
F/u message ° °Pt returning RN call. Please call back to discuss  °

## 2015-08-26 NOTE — Telephone Encounter (Signed)
Returned call to patient. Notified him of doppler study results  He wanted to know where all his stents were He states the vascular tech did not scan his L leg like they did his R leg and he was under the impression he had a left leg stent -- bilateral iliac stents, R-SFA stent, left renal stent was what I could locate  Will route to MD for clarification

## 2015-08-26 NOTE — Telephone Encounter (Signed)
Patient notified he does NOT have left leg stents. He voiced understanding and appreciated clarification on this.

## 2015-08-27 ENCOUNTER — Telehealth: Payer: Self-pay | Admitting: *Deleted

## 2015-08-27 DIAGNOSIS — I739 Peripheral vascular disease, unspecified: Secondary | ICD-10-CM

## 2015-08-27 DIAGNOSIS — I714 Abdominal aortic aneurysm, without rupture, unspecified: Secondary | ICD-10-CM

## 2015-08-27 NOTE — Telephone Encounter (Signed)
-----   Message from Lorretta Harp, MD sent at 08/23/2015  4:13 PM EDT ----- No change from prior study. Repeat in 12 months.

## 2015-09-19 DIAGNOSIS — D0439 Carcinoma in situ of skin of other parts of face: Secondary | ICD-10-CM | POA: Diagnosis not present

## 2015-09-24 DIAGNOSIS — J441 Chronic obstructive pulmonary disease with (acute) exacerbation: Secondary | ICD-10-CM | POA: Diagnosis not present

## 2015-10-20 DIAGNOSIS — J441 Chronic obstructive pulmonary disease with (acute) exacerbation: Secondary | ICD-10-CM | POA: Diagnosis not present

## 2015-10-24 DIAGNOSIS — R946 Abnormal results of thyroid function studies: Secondary | ICD-10-CM | POA: Diagnosis not present

## 2015-10-24 DIAGNOSIS — N182 Chronic kidney disease, stage 2 (mild): Secondary | ICD-10-CM | POA: Diagnosis not present

## 2015-10-24 DIAGNOSIS — Z23 Encounter for immunization: Secondary | ICD-10-CM | POA: Diagnosis not present

## 2015-10-24 DIAGNOSIS — I129 Hypertensive chronic kidney disease with stage 1 through stage 4 chronic kidney disease, or unspecified chronic kidney disease: Secondary | ICD-10-CM | POA: Diagnosis not present

## 2015-10-24 DIAGNOSIS — E559 Vitamin D deficiency, unspecified: Secondary | ICD-10-CM | POA: Diagnosis not present

## 2015-10-24 DIAGNOSIS — I2510 Atherosclerotic heart disease of native coronary artery without angina pectoris: Secondary | ICD-10-CM | POA: Diagnosis not present

## 2015-10-24 DIAGNOSIS — I1310 Hypertensive heart and chronic kidney disease without heart failure, with stage 1 through stage 4 chronic kidney disease, or unspecified chronic kidney disease: Secondary | ICD-10-CM | POA: Diagnosis not present

## 2015-11-13 DIAGNOSIS — J441 Chronic obstructive pulmonary disease with (acute) exacerbation: Secondary | ICD-10-CM | POA: Diagnosis not present

## 2015-11-21 DIAGNOSIS — L570 Actinic keratosis: Secondary | ICD-10-CM | POA: Diagnosis not present

## 2015-11-21 DIAGNOSIS — X32XXXD Exposure to sunlight, subsequent encounter: Secondary | ICD-10-CM | POA: Diagnosis not present

## 2015-11-21 DIAGNOSIS — Z08 Encounter for follow-up examination after completed treatment for malignant neoplasm: Secondary | ICD-10-CM | POA: Diagnosis not present

## 2015-11-21 DIAGNOSIS — Z85828 Personal history of other malignant neoplasm of skin: Secondary | ICD-10-CM | POA: Diagnosis not present

## 2015-12-22 DIAGNOSIS — R946 Abnormal results of thyroid function studies: Secondary | ICD-10-CM | POA: Diagnosis not present

## 2015-12-23 DIAGNOSIS — J441 Chronic obstructive pulmonary disease with (acute) exacerbation: Secondary | ICD-10-CM | POA: Diagnosis not present

## 2016-01-14 ENCOUNTER — Other Ambulatory Visit: Payer: Self-pay | Admitting: Cardiovascular Disease

## 2016-01-29 ENCOUNTER — Telehealth: Payer: Self-pay | Admitting: Cardiovascular Disease

## 2016-01-29 NOTE — Telephone Encounter (Signed)
New Message   Patient experiencing pain in kidney. PCP is out of town.

## 2016-01-29 NOTE — Telephone Encounter (Signed)
Reviewed. Spoke w wife.  Hx of renal artery stenosis w/ LRA stent. Note stable renal arteries on last [place], though he is a few months overdue for his annual recheck.  Pt woke w acute bilateral flank pain this AM. Apparent new onset. No other symptoms noted. Patient denies SOB, other pain, fatigue, nausea, or dizziness. Wife notes he has history of kidney "issues", stones.   Wife notes his PCP is out of town. I recommended she check to see if he can be seen by APP at PCP office or at urgent care to r/o acute issues - may need bloodwork, UA, UC, etc. She is agreeable to this.  Advised once acute concerns ruled out Dr. Gwenlyn Found may need to re-see patient in office or do follow up renal artery [place]. Wife expressed understanding and thanks.

## 2016-02-06 DIAGNOSIS — J441 Chronic obstructive pulmonary disease with (acute) exacerbation: Secondary | ICD-10-CM | POA: Diagnosis not present

## 2016-03-15 ENCOUNTER — Encounter (HOSPITAL_COMMUNITY): Payer: Self-pay | Admitting: *Deleted

## 2016-03-15 ENCOUNTER — Other Ambulatory Visit: Payer: Self-pay

## 2016-03-15 ENCOUNTER — Emergency Department (HOSPITAL_COMMUNITY): Payer: Medicare Other

## 2016-03-15 ENCOUNTER — Inpatient Hospital Stay (HOSPITAL_COMMUNITY)
Admission: EM | Admit: 2016-03-15 | Discharge: 2016-03-17 | DRG: 378 | Disposition: A | Discharge status: Home / Self Care | Payer: Medicare Other | Attending: Internal Medicine | Admitting: Internal Medicine

## 2016-03-15 DIAGNOSIS — R0602 Shortness of breath: Secondary | ICD-10-CM | POA: Diagnosis not present

## 2016-03-15 DIAGNOSIS — Z8249 Family history of ischemic heart disease and other diseases of the circulatory system: Secondary | ICD-10-CM | POA: Diagnosis not present

## 2016-03-15 DIAGNOSIS — N289 Disorder of kidney and ureter, unspecified: Secondary | ICD-10-CM | POA: Diagnosis not present

## 2016-03-15 DIAGNOSIS — Z9889 Other specified postprocedural states: Secondary | ICD-10-CM

## 2016-03-15 DIAGNOSIS — T380X5A Adverse effect of glucocorticoids and synthetic analogues, initial encounter: Secondary | ICD-10-CM | POA: Diagnosis not present

## 2016-03-15 DIAGNOSIS — E785 Hyperlipidemia, unspecified: Secondary | ICD-10-CM | POA: Diagnosis not present

## 2016-03-15 DIAGNOSIS — J441 Chronic obstructive pulmonary disease with (acute) exacerbation: Secondary | ICD-10-CM

## 2016-03-15 DIAGNOSIS — Z79899 Other long term (current) drug therapy: Secondary | ICD-10-CM

## 2016-03-15 DIAGNOSIS — Z887 Allergy status to serum and vaccine status: Secondary | ICD-10-CM

## 2016-03-15 DIAGNOSIS — N400 Enlarged prostate without lower urinary tract symptoms: Secondary | ICD-10-CM | POA: Diagnosis not present

## 2016-03-15 DIAGNOSIS — Z959 Presence of cardiac and vascular implant and graft, unspecified: Secondary | ICD-10-CM

## 2016-03-15 DIAGNOSIS — Z833 Family history of diabetes mellitus: Secondary | ICD-10-CM | POA: Diagnosis not present

## 2016-03-15 DIAGNOSIS — I10 Essential (primary) hypertension: Secondary | ICD-10-CM | POA: Diagnosis not present

## 2016-03-15 DIAGNOSIS — G4730 Sleep apnea, unspecified: Secondary | ICD-10-CM | POA: Diagnosis present

## 2016-03-15 DIAGNOSIS — H9190 Unspecified hearing loss, unspecified ear: Secondary | ICD-10-CM | POA: Diagnosis present

## 2016-03-15 DIAGNOSIS — I70209 Unspecified atherosclerosis of native arteries of extremities, unspecified extremity: Secondary | ICD-10-CM | POA: Diagnosis present

## 2016-03-15 DIAGNOSIS — E784 Other hyperlipidemia: Secondary | ICD-10-CM

## 2016-03-15 DIAGNOSIS — K219 Gastro-esophageal reflux disease without esophagitis: Secondary | ICD-10-CM | POA: Diagnosis not present

## 2016-03-15 DIAGNOSIS — K625 Hemorrhage of anus and rectum: Secondary | ICD-10-CM | POA: Diagnosis present

## 2016-03-15 DIAGNOSIS — I714 Abdominal aortic aneurysm, without rupture, unspecified: Secondary | ICD-10-CM | POA: Diagnosis present

## 2016-03-15 DIAGNOSIS — Z800 Family history of malignant neoplasm of digestive organs: Secondary | ICD-10-CM

## 2016-03-15 DIAGNOSIS — Z87442 Personal history of urinary calculi: Secondary | ICD-10-CM

## 2016-03-15 DIAGNOSIS — Z7902 Long term (current) use of antithrombotics/antiplatelets: Secondary | ICD-10-CM | POA: Diagnosis not present

## 2016-03-15 DIAGNOSIS — IMO0001 Reserved for inherently not codable concepts without codable children: Secondary | ICD-10-CM

## 2016-03-15 DIAGNOSIS — R069 Unspecified abnormalities of breathing: Secondary | ICD-10-CM | POA: Diagnosis not present

## 2016-03-15 DIAGNOSIS — R739 Hyperglycemia, unspecified: Secondary | ICD-10-CM | POA: Diagnosis present

## 2016-03-15 DIAGNOSIS — J449 Chronic obstructive pulmonary disease, unspecified: Secondary | ICD-10-CM | POA: Diagnosis present

## 2016-03-15 DIAGNOSIS — K921 Melena: Principal | ICD-10-CM | POA: Diagnosis present

## 2016-03-15 DIAGNOSIS — Z0389 Encounter for observation for other suspected diseases and conditions ruled out: Secondary | ICD-10-CM

## 2016-03-15 DIAGNOSIS — I739 Peripheral vascular disease, unspecified: Secondary | ICD-10-CM

## 2016-03-15 DIAGNOSIS — Z87891 Personal history of nicotine dependence: Secondary | ICD-10-CM

## 2016-03-15 DIAGNOSIS — R109 Unspecified abdominal pain: Secondary | ICD-10-CM

## 2016-03-15 DIAGNOSIS — Z95820 Peripheral vascular angioplasty status with implants and grafts: Secondary | ICD-10-CM

## 2016-03-15 DIAGNOSIS — R262 Difficulty in walking, not elsewhere classified: Secondary | ICD-10-CM

## 2016-03-15 DIAGNOSIS — J430 Unilateral pulmonary emphysema [MacLeod's syndrome]: Secondary | ICD-10-CM | POA: Diagnosis not present

## 2016-03-15 HISTORY — DX: Personal history of urinary calculi: Z87.442

## 2016-03-15 HISTORY — DX: Migraine, unspecified, not intractable, without status migrainosus: G43.909

## 2016-03-15 HISTORY — DX: Unspecified malignant neoplasm of skin of other parts of face: C44.309

## 2016-03-15 LAB — CBC WITH DIFFERENTIAL/PLATELET
BASOS ABS: 0 10*3/uL (ref 0.0–0.1)
BASOS ABS: 0 10*3/uL (ref 0.0–0.1)
BASOS PCT: 0 %
Basophils Relative: 0 %
EOS PCT: 0 %
Eosinophils Absolute: 0.2 10*3/uL (ref 0.0–0.7)
Eosinophils Absolute: 0 10*3/uL (ref 0.0–0.7)
Eosinophils Relative: 2 %
HCT: 40.7 % (ref 39.0–52.0)
HEMATOCRIT: 44.3 % (ref 39.0–52.0)
Hemoglobin: 14.7 g/dL (ref 13.0–17.0)
Hemoglobin: 13.4 g/dL (ref 13.0–17.0)
LYMPHS ABS: 3.2 10*3/uL (ref 0.7–4.0)
LYMPHS PCT: 24 %
Lymphocytes Relative: 5 %
Lymphs Abs: 0.3 10*3/uL — ABNORMAL LOW (ref 0.7–4.0)
MCH: 31.2 pg (ref 26.0–34.0)
MCH: 30.4 pg (ref 26.0–34.0)
MCHC: 33.2 g/dL (ref 30.0–36.0)
MCHC: 32.9 g/dL (ref 30.0–36.0)
MCV: 94.1 fL (ref 78.0–100.0)
MCV: 92.3 fL (ref 78.0–100.0)
MONO ABS: 0.6 10*3/uL (ref 0.1–1.0)
MONO ABS: 0 10*3/uL — AB (ref 0.1–1.0)
Monocytes Relative: 5 %
Monocytes Relative: 1 %
NEUTROS ABS: 9.1 10*3/uL — AB (ref 1.7–7.7)
Neutro Abs: 5 10*3/uL (ref 1.7–7.7)
Neutrophils Relative %: 69 %
Neutrophils Relative %: 94 %
PLATELETS: 150 10*3/uL (ref 150–400)
Platelets: 180 10*3/uL (ref 150–400)
RBC: 4.71 MIL/uL (ref 4.22–5.81)
RBC: 4.41 MIL/uL (ref 4.22–5.81)
RDW: 14 % (ref 11.5–15.5)
RDW: 13.7 % (ref 11.5–15.5)
WBC: 13.2 10*3/uL — ABNORMAL HIGH (ref 4.0–10.5)
WBC: 5.3 10*3/uL (ref 4.0–10.5)

## 2016-03-15 LAB — URINALYSIS, ROUTINE W REFLEX MICROSCOPIC
BILIRUBIN URINE: NEGATIVE
GLUCOSE, UA: NEGATIVE mg/dL
Ketones, ur: NEGATIVE mg/dL
Leukocytes, UA: NEGATIVE
NITRITE: NEGATIVE
PH: 5 (ref 5.0–8.0)
Protein, ur: NEGATIVE mg/dL
SPECIFIC GRAVITY, URINE: 1.015 (ref 1.005–1.030)
Squamous Epithelial / LPF: NONE SEEN

## 2016-03-15 LAB — LACTIC ACID, PLASMA
LACTIC ACID, VENOUS: 1.6 mmol/L (ref 0.5–1.9)
Lactic Acid, Venous: 2.9 mmol/L (ref 0.5–1.9)

## 2016-03-15 LAB — TYPE AND SCREEN
ABO/RH(D): O POS
ANTIBODY SCREEN: NEGATIVE

## 2016-03-15 LAB — COMPREHENSIVE METABOLIC PANEL
ALBUMIN: 3.6 g/dL (ref 3.5–5.0)
ALT: 15 U/L — AB (ref 17–63)
AST: 22 U/L (ref 15–41)
Alkaline Phosphatase: 98 U/L (ref 38–126)
Anion gap: 10 (ref 5–15)
BUN: 19 mg/dL (ref 6–20)
CHLORIDE: 107 mmol/L (ref 101–111)
CO2: 26 mmol/L (ref 22–32)
CREATININE: 1.34 mg/dL — AB (ref 0.61–1.24)
Calcium: 9.2 mg/dL (ref 8.9–10.3)
GFR calc Af Amer: 56 mL/min — ABNORMAL LOW (ref >60)
GFR, EST NON AFRICAN AMERICAN: 48 mL/min — AB (ref >60)
GLUCOSE: 205 mg/dL — AB (ref 65–99)
POTASSIUM: 3.4 mmol/L — AB (ref 3.5–5.1)
SODIUM: 143 mmol/L (ref 135–145)
Total Bilirubin: 0.9 mg/dL (ref 0.3–1.2)
Total Protein: 6.5 g/dL (ref 6.5–8.1)

## 2016-03-15 LAB — I-STAT TROPONIN, ED: Troponin i, poc: 0 ng/mL (ref 0.00–0.08)

## 2016-03-15 LAB — ABO/RH: ABO/RH(D): O POS

## 2016-03-15 LAB — LIPASE, BLOOD: LIPASE: 19 U/L (ref 11–51)

## 2016-03-15 LAB — POC OCCULT BLOOD, ED: FECAL OCCULT BLD: POSITIVE — AB

## 2016-03-15 LAB — BRAIN NATRIURETIC PEPTIDE: B NATRIURETIC PEPTIDE 5: 65.8 pg/mL (ref 0.0–100.0)

## 2016-03-15 MED ORDER — ACETAMINOPHEN 325 MG PO TABS: 650.0000 mg | ORAL_TABLET | Freq: Four times a day (QID) | ORAL | Status: DC | PRN

## 2016-03-15 MED ORDER — TAMSULOSIN HCL 0.4 MG PO CAPS
0.4000 mg | ORAL_CAPSULE | Freq: Every day | ORAL | Status: DC
Start: 2016-03-15 — End: 2016-03-17
  Administered 2016-03-15 – 2016-03-17 (×3): 0.4 mg via ORAL
  Filled 2016-03-15 (×3): qty 1

## 2016-03-15 MED ORDER — SODIUM CHLORIDE 0.9 % IV SOLN
INTRAVENOUS | Status: DC
  Administered 2016-03-15 – 2016-03-17 (×3): via INTRAVENOUS

## 2016-03-15 MED ORDER — IPRATROPIUM-ALBUTEROL 0.5-2.5 (3) MG/3ML IN SOLN: 3.0000 mL | RESPIRATORY_TRACT | Status: DC

## 2016-03-15 MED ORDER — ONDANSETRON HCL 4 MG PO TABS: 4.0000 mg | ORAL_TABLET | Freq: Four times a day (QID) | ORAL | Status: DC | PRN

## 2016-03-15 MED ORDER — PANTOPRAZOLE SODIUM 40 MG PO TBEC
40.0000 mg | DELAYED_RELEASE_TABLET | Freq: Every day | ORAL | Status: DC
  Administered 2016-03-15 – 2016-03-17 (×3): 40 mg via ORAL
  Filled 2016-03-15 (×3): qty 1

## 2016-03-15 MED ORDER — SODIUM CHLORIDE 0.9% FLUSH
3.0000 mL | Freq: Two times a day (BID) | INTRAVENOUS | Status: DC
  Administered 2016-03-16 – 2016-03-17 (×2): 3 mL via INTRAVENOUS

## 2016-03-15 MED ORDER — ALBUTEROL SULFATE HFA 108 (90 BASE) MCG/ACT IN AERS: 2.0000 | INHALATION_SPRAY | Freq: Four times a day (QID) | RESPIRATORY_TRACT | Status: DC | PRN

## 2016-03-15 MED ORDER — SODIUM CHLORIDE 0.9 % IV SOLN: INTRAVENOUS | Status: DC

## 2016-03-15 MED ORDER — ONDANSETRON HCL 4 MG/2ML IJ SOLN
4.0000 mg | Freq: Four times a day (QID) | INTRAMUSCULAR | Status: DC | PRN
  Administered 2016-03-15 – 2016-03-17 (×2): 4 mg via INTRAVENOUS
  Filled 2016-03-15 (×2): qty 2

## 2016-03-15 MED ORDER — PANTOPRAZOLE SODIUM 40 MG IV SOLR
40.0000 mg | Freq: Two times a day (BID) | INTRAVENOUS | Status: DC
  Filled 2016-03-15: qty 40

## 2016-03-15 MED ORDER — FINASTERIDE 5 MG PO TABS
5.0000 mg | ORAL_TABLET | Freq: Every morning | ORAL | Status: DC
  Administered 2016-03-15 – 2016-03-17 (×3): 5 mg via ORAL
  Filled 2016-03-15 (×3): qty 1

## 2016-03-15 MED ORDER — IPRATROPIUM-ALBUTEROL 0.5-2.5 (3) MG/3ML IN SOLN
3.0000 mL | RESPIRATORY_TRACT | Status: DC | PRN
  Administered 2016-03-16: 3 mL via RESPIRATORY_TRACT
  Filled 2016-03-15: qty 3

## 2016-03-15 MED ORDER — ACETAMINOPHEN 650 MG RE SUPP: 650.0000 mg | Freq: Four times a day (QID) | RECTAL | Status: DC | PRN

## 2016-03-15 MED ORDER — TIOTROPIUM BROMIDE MONOHYDRATE 18 MCG IN CAPS
18.0000 ug | ORAL_CAPSULE | Freq: Every day | RESPIRATORY_TRACT | Status: DC
  Administered 2016-03-15 – 2016-03-17 (×3): 18 ug via RESPIRATORY_TRACT
  Filled 2016-03-15: qty 5

## 2016-03-15 MED ORDER — ALBUTEROL SULFATE (2.5 MG/3ML) 0.083% IN NEBU
2.5000 mg | INHALATION_SOLUTION | RESPIRATORY_TRACT | Status: DC | PRN
  Administered 2016-03-15: 2.5 mg via RESPIRATORY_TRACT
  Filled 2016-03-15: qty 3

## 2016-03-15 NOTE — Care Management Obs Status (Signed)
[facility] NOTIFICATION   Patient Details  Name: Douglas Monroe MRN: [name] Date of Birth: 1933/01/31   Medicare Observation Status Notification Given:  Yes    Carles Collet, RN 03/15/2016, 2:53 PM

## 2016-03-15 NOTE — ED Provider Notes (Signed)
[facility] DEPT Provider Note   CSN: [id number] Arrival date & time: 03/15/16  0429     History   Chief Complaint Chief Complaint  Patient presents with  . Shortness of Breath  . Emesis    HPI Douglas Monroe is a 83 y.o. male.  He was awakened at about 3 AM by low abdominal cramping. He went to the bathroom but was only able to pass some flatus. At this point, he started developing dyspnea which has gotten progressively worse. He used his albuterol rescue inhaler with no relief so called EMS. EMS noted respiratory distress and gave him methylprednisolone, magnesium, epinephrine, and 2 nebulizer treatments with albuterol and ipratropium. He states he is breathing pattern now. He denied any chest pain, heaviness, tightness, pressure. He denied any cough. He did develop nausea in the ambulance did vomit. He denies diaphoresis.    Shortness of Breath  Associated symptoms include vomiting.  Emesis      Past Medical History:  Diagnosis Date  . Abdominal aortic aneurysm ([place])    infrarenal -- monitored by dr berry (note states stable 4.0 to 4.1cm)  . Arthritis    hands  . COPD with emphysema ([place])   . Exertional dyspnea   . First degree heart block   . GERD (gastroesophageal reflux disease)   . Headache(784.0)    uses goody powder  . HOH (hard of hearing)    left ear; no hearing aid   . HTN (hypertension) 08/20/2011  . Hyperlipidemia   . Hypertension   . Left carotid artery stenosis    moderate  . 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 PVD (peripheral vascular disease) with claudication [facility]) cardiologist-  dr berry   s/p bilateral iliac stents and right sfa stenting/  doppper study 09-05-2012  revealed ABIs close to one bilaterally with patent stents/  stenting left renal artery stenosis   . Right ureteral stone   . S/P arterial stent    left renal angioplasty and stent 11-02-2012  . Sleep apnea    at risk for OSA. stop/bang score= 6;  sent to PCP 10/04/13    Patient Active Problem List   Diagnosis Date Noted  . Ureteral calculus 10/10/2013  . Hyperlipidemia 07/24/2013  . S/P arterial stent, to Lt renal art. 11/02/12 11/03/2012  . Carotid artery disease ([place]) 10/06/2012  . Renal artery stenosis, progression of disease 10/06/2012  . Abdominal aortic aneurysm ([place]) 10/06/2012  . Normal coronary arteries- 2004, negative Myoview 2012 07/21/2012  . PVD (peripheral vascular disease) with claudication, previous stents to bilateral iliacs. Rt. SFA also.  08/20/2011  . Claudication of left lower extremity, with known stent in iliac 08/20/2011  . S/P angioplasty with stent of ostium of Lt. common iliac artery and PTA and stent of Lt external and common femoral arteries. 08/19/11. 08/20/2011  . COPD (chronic obstructive pulmonary disease) ([place]) 08/20/2011  . HTN (hypertension) 08/20/2011    Past Surgical History:  Procedure Laterality Date  . ABDOMINAL ANGIOGRAM  08/19/2011   Left common iliac artery at the ostium, 6x39mm ICAST covered stent, common femoral artery, 8x155mm Abbott absolute pro nitinol self-expanding stent and resulting in reduction of 80 and 90% stenosis to 0% residual  . ABDOMINAL ANGIOGRAM  10/08/2010   Mid-right SFA, 6x120 EV3 Protege nitinol self-expanding stent, resulting in reduction of CTO to 0% residual  . ABDOMINAL ANGIOGRAM  06/22/2010   Rt Common Femoral Artery-8x3 Absolute Pro Nitinol self-expanding stent-90% stenosis to 0% residual; Rt  External Iliac Artery-9x3 Absolute Pro Nitinol self-expanding stent-70% stenosis to 0% residual; Left External Iliac Artery-8x4 Absolute Pro Nitinol self-expanding stent-90% to 0% residual  . ATHERECTOMY N/A 08/19/2011   Procedure: ATHERECTOMY;  Surgeon: Lorretta Harp, MD;  Location: [facility] CATH LAB;  Service: Cardiovascular;  Laterality: N/A;  . CARDIAC CATHETERIZATION  10/26/2002  dr Tami Ribas   normal coronary arteries/ normal  LVF  . CARDIOVASCULAR STRESS TEST  06-12-2010   dr  berry   normal perfusion study/ no ischemia/  ef 69%  . CYSTOSCOPY WITH RETROGRADE PYELOGRAM, URETEROSCOPY AND STENT PLACEMENT Right 10/10/2013   Procedure: uretheral dilation, CYSTOSCOPY, URETEROSCOPY, stone extraction, STENT PLACEMENT;  Surgeon: [name], MD;  Location: [facility];  Service: Urology;  Laterality: Right;  . HOLMIUM LASER APPLICATION Right [id number]   Procedure: HOLMIUM LASER APPLICATION;  Surgeon: [name], MD;  Location: [facility];  Service: Urology;  Laterality: Right;  . LUMBAR DISC SURGERY  1970's  . RENAL ANGIOGRAM N/A 11/02/2012   Procedure: RENAL ANGIOGRAM;  Surgeon: Lorretta Harp, MD;  Location: [facility] CATH LAB;  Service: Cardiovascular;  Laterality: N/A;  . RENAL ARTERY STENT Left 11/02/2012  . SOFT TISSUE CYST EXCISION  1960's   "outside part of lung LLL"       Home Medications    Prior to Admission medications   Medication Sig Start Date End Date Taking? Authorizing Provider  albuterol (PROVENTIL HFA;VENTOLIN HFA) 108 (90 BASE) MCG/ACT inhaler Inhale 2 puffs into the lungs every 6 (six) hours as needed for shortness of breath.     Historical Provider, MD  amLODipine (NORVASC) 5 MG tablet Take 5 mg by mouth every evening.    Historical Provider, MD  atorvastatin (LIPITOR) 20 MG tablet TAKE 1 TABLET BY MOUTH  DAILY AT 6 PM. 01/14/16   Lorretta Harp, MD  clopidogrel (PLAVIX) 75 MG tablet TAKE 1 TABLET BY MOUTH  EVERY DAY 01/14/16   Lorretta Harp, MD  finasteride (PROSCAR) 5 MG tablet Take 5 mg by mouth every morning.    Historical Provider, MD  ranitidine (ZANTAC) 150 MG tablet Take 150 mg by mouth 2 (two) times daily as needed for heartburn.     Historical Provider, MD  tamsulosin (FLOMAX) 0.4 MG CAPS capsule Take 1 capsule by mouth daily. 07/13/13   Historical Provider, MD  tiotropium (SPIRIVA) 18 MCG inhalation capsule Place 18 mcg into inhaler and inhale daily.    Historical Provider, MD  valsartan (DIOVAN) 160 MG  tablet Take 1 tablet by mouth daily. 07/22/14   Historical Provider, MD    Family History Family History  Problem Relation Age of Onset  . Hypertension Mother   . Cancer Father   . Diabetes Brother   . Hypertension Son     Social History Social History  Substance Use Topics  . Smoking status: Former Smoker    Packs/day: 1.00    Years: 60.00    Types: Cigarettes    Quit date: 06/02/2010  . Smokeless tobacco: Never Used  . Alcohol use No     Allergies   Pneumococcal vaccines   Review of Systems Review of Systems  Respiratory: Positive for shortness of breath.   Gastrointestinal: Positive for vomiting.     Physical Exam Updated Vital Signs BP 139/71   Pulse 85   Temp 97.4 F (36.3 C) (Oral)   Resp 22   SpO2 95%   Physical Exam  83 year old male, resting comfortably and in  no acute distress. Vital signs are significant for mild tachypnea. Oxygen saturation is 91%, which is hypoxic. Head is normocephalic and atraumatic. PERRLA, EOMI. Oropharynx is clear. Neck is nontender and supple without adenopathy or JVD. Back is nontender and there is no CVA tenderness. Lungs have decreased breath sounds throughout. There are bibasal rales which go about one third of the way up. There are no overt wheezes or rhonchi. Chest is nontender. Heart has regular rate and rhythm without murmur. Abdomen is soft, flat, nontender without masses or hepatosplenomegaly and peristalsis is normoactive. Extremities have no cyanosis or edema, full range of motion is present. There is 1-2+ presacral edema. Skin is warm and dry without rash. Neurologic: Mental status is normal, cranial nerves are intact, there are no motor or sensory deficits. ED Treatments / Results  Labs (all labs ordered are listed, but only abnormal results are displayed) Labs Reviewed  COMPREHENSIVE METABOLIC PANEL - Abnormal; Notable for the following:       Result Value   Potassium 3.4 (*)    Glucose, Bld 205 (*)     Creatinine, Ser 1.34 (*)    ALT 15 (*)    GFR calc non Af Amer 48 (*)    GFR calc Af Amer 56 (*)    All other components within normal limits  CBC WITH DIFFERENTIAL/PLATELET - Abnormal; Notable for the following:    WBC 13.2 (*)    Neutro Abs 9.1 (*)    All other components within normal limits  POC OCCULT BLOOD, ED - Abnormal; Notable for the following:    Fecal Occult Bld POSITIVE (*)    All other components within normal limits  URINE CULTURE  LIPASE, BLOOD  BRAIN NATRIURETIC PEPTIDE  HEMOGLOBIN A1C  CBC WITH DIFFERENTIAL/PLATELET  LACTIC ACID, PLASMA  URINALYSIS, ROUTINE W REFLEX MICROSCOPIC  I-STAT TROPOININ, ED  TYPE AND SCREEN    EKG  EKG Interpretation  Date/Time:  Monday March 15 2016 04:29:21 EST Ventricular Rate:  113 PR Interval:    QRS Duration: 103 QT Interval:  324 QTC Calculation: 445 R Axis:   -81 Text Interpretation:  Sinus tachycardia Inferior infarct, old Lateral leads are also involved When compared with ECG of 01/31/2014, No significant change was found Confirmed by [facility]  MD, Yashira Offenberger ([id number]) on 03/15/2016 5:04:23 AM       Radiology Dg Chest Port 1 View  Result Date: 03/15/2016 CLINICAL DATA:  83 y/o  M; shortness of breath. EXAM: PORTABLE CHEST 1 VIEW COMPARISON:  01/31/2014 chest radiograph FINDINGS: Stable normal cardiac silhouette. Aortic atherosclerosis with calcification. Biapical pleuroparenchymal scarring. Bronchitic changes in the left lung base. No focal consolidation. No pleural effusion. Bones are unremarkable. IMPRESSION: Bronchitic changes in the left lung base. No focal consolidation or pleural effusion. Electronically Signed   By: Kristine Garbe M.D.   On: 03/15/2016 04:45    Procedures Procedures (including critical care time) CRITICAL CARE Performed by: [id number] Total critical care time: 45 minutes Critical care time was exclusive of separately billable procedures and treating other patients. Critical care was  necessary to treat or prevent imminent or life-threatening deterioration. Critical care was time spent personally by me on the following activities: development of treatment plan with patient and/or surrogate as well as nursing, discussions with consultants, evaluation of patient's response to treatment, examination of patient, obtaining history from patient or surrogate, ordering and performing treatments and interventions, ordering and review of laboratory studies, ordering and review of radiographic studies, pulse oximetry and re-evaluation of patient's  condition.  Medications Ordered in ED Medications  albuterol (PROVENTIL) (2.5 MG/3ML) 0.083% nebulizer solution 2.5 mg (not administered)  ipratropium-albuterol (DUONEB) 0.5-2.5 (3) MG/3ML nebulizer solution 3 mL (not administered)  0.9 %  sodium chloride infusion (not administered)     Initial Impression / Assessment and Plan / ED Course  I have reviewed the triage vital signs and the nursing notes.  Pertinent labs & imaging results that were available during my care of the patient were reviewed by me and considered in my medical decision making (see chart for details).  Acute dyspnea. There was some clinical response to use of bronchodilators, but exam is currently more consistent with CHF. Old records are reviewed, and he is followed for an abdominal aortic aneurysm which is stable at 4.0 cm. He is also followed for peripheral vascular disease. Will check chest x-ray, ECG, BNP and troponin. He is currently tolerating Being off CPAP.  Chest x-ray shows no evidence of pneumonia, BNP has come back normal. Patient continues to do reasonably well without CPAP or BiPAP. He is maintaining adequate oxygen saturation of 93% on room air. Plan was to ambulate him to see if he had oxygen desaturation with exertion. However, before this could be done, he had a bowel movement which was bright red blood. At this point, it was felt he should be kept in the  hospital where hemoglobin can be monitored and GI consultation be obtained if necessary. Case is discussed with Dr. [name] of [facility] who agrees to admit the patient.  Final Clinical Impressions(s) / ED Diagnoses   Final diagnoses:  COPD exacerbation ([place])  Rectal bleeding  Renal insufficiency    New Prescriptions New Prescriptions   No medications on file     Delora Fuel, MD [id number]

## 2016-03-15 NOTE — ED Triage Notes (Signed)
Pt to ED from home by [facility] c/o SOB and abdominal pain. Pt woke up this morning feeling like he needed to have a BM. Initially had more gas, then had a bowel movement with some bright red blood movement per EMS. Pt felt sob after BM, took several puffs of proair without relief. Hx of copd. Initial sat 91% with diminished lung sounds. Pt placed on cpap for labored breathing, given duonebs x2, 125mg  solumedrol, 0.3mg  epi, and 2g mag sulfate enroute. On arrival, pt on room air, reports feeling significant improvement. Pt denies pain, diminished lung sounds present, sats 93% on room air. A&Ox4

## 2016-03-15 NOTE — ED Notes (Addendum)
Pt placed on bedpan, bright red blood noted in bed pan and underwear. MD aware, hemoccult collected.

## 2016-03-15 NOTE — Progress Notes (Signed)
This is a no charge note  Pending admission per Dr. Roxanne Mins  83 year old male with past medical history of hypertension, hyperlipidemia, COPD, GERD, PVD, carotid artery stenosis, AAA, who presents with cough and SOB, no infiltration on chest x-ray, clinically consistence with COPD exacerbation. Patient also had rectal bleeding with hemoglobin 14.7. Vital signs stable. Slightly tachycardia. O2 sat 91-94%. Pt is accepted to tele bed as inpt.  Ivor Costa, MD  [facility] Pager [phone]  If 7PM-7AM, please contact night-coverage [email or web address] Password [facility] 03/15/2016, 6:49 AM

## 2016-03-15 NOTE — Progress Notes (Addendum)
Paged/Notified MD Marily Memos [phone]

## 2016-03-15 NOTE — Progress Notes (Signed)
New Admission Note:   Arrival Method: Stretcher  Mental Orientation: alert and orientated x4  Telemetry: box 25  Assessment: Completed Skin: see Flowsheet  Iv: Left wrist and Right AC  Pain: denies  Tubes:n/a Safety Measures: Safety Fall Prevention Plan discussed  Admission: In progress 6 [place] Orientation: Patient has been orientated to the room, unit and staff.  Family: n/a  Orders have been reviewed and implemented. Will continue to monitor the patient. Call light has been placed within reach and bed alarm has been activated.   Emilio Math, RN [facility]  Phone number: [phone]

## 2016-03-15 NOTE — H&P (Signed)
History and Physical    Douglas Monroe [id number] DOB: Jan 14, 1933 DOA: 03/15/2016   PCP: [name], MD   Patient coming from/Resides with: Private residence/lives with wife  Admission status: Inpatient/telemetry -medically necessary to stay a minimum 2 midnights to rule out impending and/or unexpected changes in physiologic status that may differ from initial evaluation performed in the ER and/or at time of admission. Presents with rectal bleeding symptoms of unclear etiology. Pre-arrival COPD exacerbation currently stable. Will require inpatient GI consultation, IV Protonix twice a day and serial CBC.  Chief Complaint: Rectal bleeding  HPI: Douglas Monroe is a 83 y.o. male with medical history significant for COPD, hypertension, dyslipidemia, abdominal aortic aneurysm less than 5 cm, renal artery stent 2/2 RAS, peripheral vascular disease status post multiple left stents. Patient awakened early this morning with lower abdominal pain over the suprapubic region reporting he felt "gassy". Initial attempt to pass BM resulted in flatus only but diminished his pain. He was unable to pass a BM and he did not wish to strain. He made multiple laps by walking throughout the house back and forth to the bathroom without any results. Eventually he passed red/melanotic stool. Unfortunately due to on the excessive walking patient developed COPD exacerbation symptoms that did not respond to home Pro air. EMS was called to the home. Patient was treated for COPD exacerbation with appropriate medications and initially required CPAP because of diminished air movement but by the time he arrived to the ER was stable on room air. Since arrival to the ER he is continued to pass maroon-colored stool.  ED Course:  Vital Signs: BP 139/71   Pulse 85   Temp 97.4 F (36.3 C) (Oral)   Resp 22   SpO2 95%  PCXR: No acute process Lab data: Sodium 143, potassium 3.4, chloride 107, CO2 26, glucose 205, BUN 19,  creatinine 1.34, LFTs normal, BNP 66, poc troponin 0.00, white count 13,200 with neutrophils 69%, neutrophils 9.1%, hemoglobin 14.7, platelets 108,000, FOB positive. Medications and treatments: None in the ER-see below regarding EMS treatments  Review of Systems:  In addition to the HPI above,  No Fever-chills, myalgias or other constitutional symptoms No Headache, changes with Vision or hearing, new weakness, tingling, numbness in any extremity, dizziness, dysarthria or word finding difficulty, gait disturbance or imbalance, tremors or seizure activity No problems swallowing food or Liquids, indigestion/reflux, choking or coughing while eating, abdominal pain with or after eating No Chest pain, Cough or Shortness of Breath, palpitations, orthopnea or DOE No N/V, dark tarry stools, constipation No dysuria, malodorous urine, hematuria or flank pain No new skin rashes, lesions, masses or bruises, No new joint pains, aches, swelling or redness No recent unintentional weight gain or loss No polyuria, polydypsia or polyphagia   Past Medical History:  Diagnosis Date  . Abdominal aortic aneurysm ([place])    infrarenal -- monitored by dr berry (note states stable 4.0 to 4.1cm)  . Arthritis    hands  . COPD with emphysema ([place])   . Exertional dyspnea   . First degree heart block   . GERD (gastroesophageal reflux disease)   . Headache(784.0)    uses goody powder  . HOH (hard of hearing)    left ear; no hearing aid   . HTN (hypertension) 08/20/2011  . Hyperlipidemia   . Hypertension   . Left carotid artery stenosis    moderate  . 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 PVD (peripheral vascular  disease) with claudication [facility]) cardiologist-  dr berry   s/p bilateral iliac stents and right sfa stenting/  doppper study 09-05-2012  revealed ABIs close to one bilaterally with patent stents/  stenting left renal artery stenosis   . Right ureteral stone   . S/P arterial  stent    left renal angioplasty and stent 11-02-2012  . Sleep apnea    at risk for OSA. stop/bang score= 6; sent to PCP 10/04/13    Past Surgical History:  Procedure Laterality Date  . ABDOMINAL ANGIOGRAM  08/19/2011   Left common iliac artery at the ostium, 6x24mm ICAST covered stent, common femoral artery, 8x150mm Abbott absolute pro nitinol self-expanding stent and resulting in reduction of 80 and 90% stenosis to 0% residual  . ABDOMINAL ANGIOGRAM  10/08/2010   Mid-right SFA, 6x120 EV3 Protege nitinol self-expanding stent, resulting in reduction of CTO to 0% residual  . ABDOMINAL ANGIOGRAM  06/22/2010   Rt Common Femoral Artery-8x3 Absolute Pro Nitinol self-expanding stent-90% stenosis to 0% residual; Rt External Iliac Artery-9x3 Absolute Pro Nitinol self-expanding stent-70% stenosis to 0% residual; Left External Iliac Artery-8x4 Absolute Pro Nitinol self-expanding stent-90% to 0% residual  . ATHERECTOMY N/A 08/19/2011   Procedure: ATHERECTOMY;  Surgeon: Lorretta Harp, MD;  Location: [facility] CATH LAB;  Service: Cardiovascular;  Laterality: N/A;  . CARDIAC CATHETERIZATION  10/26/2002  dr Tami Ribas   normal coronary arteries/ normal  LVF  . CARDIOVASCULAR STRESS TEST  06-12-2010   dr berry   normal perfusion study/ no ischemia/  ef 69%  . CYSTOSCOPY WITH RETROGRADE PYELOGRAM, URETEROSCOPY AND STENT PLACEMENT Right 10/10/2013   Procedure: uretheral dilation, CYSTOSCOPY, URETEROSCOPY, stone extraction, STENT PLACEMENT;  Surgeon: [name], MD;  Location: [facility];  Service: Urology;  Laterality: Right;  . HOLMIUM LASER APPLICATION Right [id number]   Procedure: HOLMIUM LASER APPLICATION;  Surgeon: [name], MD;  Location: [facility];  Service: Urology;  Laterality: Right;  . LUMBAR DISC SURGERY  1970's  . RENAL ANGIOGRAM N/A 11/02/2012   Procedure: RENAL ANGIOGRAM;  Surgeon: Lorretta Harp, MD;  Location: [facility] CATH LAB;  Service: Cardiovascular;  Laterality: N/A;    . RENAL ARTERY STENT Left 11/02/2012  . SOFT TISSUE CYST EXCISION  1960's   "outside part of lung LLL"    Social History   Social History  . Marital status: Married    Spouse name: N/A  . Number of children: N/A  . Years of education: N/A   Occupational History  . Not on file.   Social History Main Topics  . Smoking status: Former Smoker    Packs/day: 1.00    Years: 60.00    Types: Cigarettes    Quit date: 06/02/2010  . Smokeless tobacco: Never Used  . Alcohol use No  . Drug use: No  . Sexual activity: Not on file   Other Topics Concern  . Not on file   Social History Narrative  . No narrative on file    Mobility: Without assistive devices Work history: Not obtained   Allergies  Allergen Reactions  . Pneumococcal Vaccines Swelling    Lip swelling    Family History  Problem Relation Age of Onset  . Hypertension Mother   . Cancer Father   . Diabetes Brother   . Hypertension Son      Prior to Admission medications   Medication Sig Start Date End Date Taking? Authorizing Provider  albuterol (PROVENTIL HFA;VENTOLIN HFA) 108 (90 BASE) MCG/ACT inhaler Inhale  2 puffs into the lungs every 6 (six) hours as needed for shortness of breath.    Yes Historical Provider, MD  amLODipine (NORVASC) 5 MG tablet Take 5 mg by mouth every evening.   Yes Historical Provider, MD  atorvastatin (LIPITOR) 20 MG tablet TAKE 1 TABLET BY MOUTH  DAILY AT 6 PM. 01/14/16  Yes Lorretta Harp, MD  BYSTOLIC 10 MG tablet Take 10 mg by mouth daily. 02/23/16  Yes Historical Provider, MD  clopidogrel (PLAVIX) 75 MG tablet TAKE 1 TABLET BY MOUTH  EVERY DAY 01/14/16  Yes Lorretta Harp, MD  [employer] D3 2000 units CAPS Take 1 capsule by mouth daily. 01/31/16  Yes Historical Provider, MD  finasteride (PROSCAR) 5 MG tablet Take 5 mg by mouth every morning.   Yes Historical Provider, MD  pantoprazole (PROTONIX) 40 MG tablet Take 40 mg by mouth daily. 01/21/16  Yes Historical Provider, MD  tamsulosin  (FLOMAX) 0.4 MG CAPS capsule Take 1 capsule by mouth daily. 07/13/13  Yes Historical Provider, MD  tiotropium (SPIRIVA) 18 MCG inhalation capsule Place 18 mcg into inhaler and inhale daily.   Yes Historical Provider, MD  valsartan (DIOVAN) 160 MG tablet Take 1 tablet by mouth daily. 07/22/14  Yes Historical Provider, MD    Physical Exam: Vitals:   03/15/16 0615 03/15/16 0630 03/15/16 0645 03/15/16 0715  BP: 151/86 153/79 142/85 139/71  Pulse: 93 88 90 85  Resp:  17 19 22   Temp:      TempSrc:      SpO2: 94% 94% 96% 95%      Constitutional: NAD, calm, comfortable Eyes: PERRL, lids and conjunctivae normal ENMT: Mucous membranes are moist. Posterior pharynx clear of any exudate or lesions.Normal dentition.  Neck: normal, supple, no masses, no thyromegaly Respiratory: clear to auscultation bilaterally, no wheezing, no crackles. Normal respiratory effort. No accessory muscle use. RA Cardiovascular: Regular rate and rhythm, no murmurs / rubs / gallops. No extremity edema. 2+ pedal pulses. No carotid bruits.  Abdomen: no tenderness, no masses palpated. No hepatosplenomegaly. Bowel sounds positive.  Musculoskeletal: no clubbing / cyanosis. No joint deformity upper and lower extremities. Good ROM, no contractures. Normal muscle tone.  Skin: no lesions, ulcers. No induration;Scattered fine papular lesions throughout all skin surfaces. Diffuse dry skin. Also areas of excoriation from prior scratching. Neurologic: CN 2-12 grossly intact. Sensation intact, DTR normal. Strength 5/5 x all 4 extremities.  Psychiatric: Normal judgment and insight. Alert and oriented x 3. Normal mood.    Labs on Admission: I have personally reviewed following labs and imaging studies  CBC:  Recent Labs Lab 03/15/16 0433  WBC 13.2*  NEUTROABS 9.1*  HGB 14.7  HCT 44.3  MCV 94.1  PLT 99991111   Basic Metabolic Panel:  Recent Labs Lab 03/15/16 0433  NA 143  K 3.4*  CL 107  CO2 26  GLUCOSE 205*  BUN 19    CREATININE 1.34*  CALCIUM 9.2   GFR: CrCl cannot be calculated (Unknown ideal weight.). Liver Function Tests:  Recent Labs Lab 03/15/16 0433  AST 22  ALT 15*  ALKPHOS 98  BILITOT 0.9  PROT 6.5  ALBUMIN 3.6    Recent Labs Lab 03/15/16 0433  LIPASE 19   No results for input(s): AMMONIA in the last 168 hours. Coagulation Profile: No results for input(s): INR, PROTIME in the last 168 hours. Cardiac Enzymes: No results for input(s): CKTOTAL, CKMB, CKMBINDEX, TROPONINI in the last 168 hours. BNP (last 3 results) No results for input(s):  PROBNP in the last 8760 hours. HbA1C: No results for input(s): HGBA1C in the last 72 hours. CBG: No results for input(s): GLUCAP in the last 168 hours. Lipid Profile: No results for input(s): CHOL, HDL, LDLCALC, TRIG, CHOLHDL, LDLDIRECT in the last 72 hours. Thyroid Function Tests: No results for input(s): TSH, T4TOTAL, FREET4, T3FREE, THYROIDAB in the last 72 hours. Anemia Panel: No results for input(s): VITAMINB12, FOLATE, FERRITIN, TIBC, IRON, RETICCTPCT in the last 72 hours. Urine analysis:    Component Value Date/Time   COLORURINE YELLOW 04/24/2013 0238   APPEARANCEUR CLOUDY (A) 04/24/2013 0238   LABSPEC 1.023 04/24/2013 0238   PHURINE 5.5 04/24/2013 0238   GLUCOSEU NEGATIVE 04/24/2013 0238   HGBUR LARGE (A) 04/24/2013 0238   BILIRUBINUR NEGATIVE 04/24/2013 0238   KETONESUR NEGATIVE 04/24/2013 0238   PROTEINUR NEGATIVE 04/24/2013 0238   UROBILINOGEN 0.2 04/24/2013 0238   NITRITE NEGATIVE 04/24/2013 0238   LEUKOCYTESUR NEGATIVE 04/24/2013 0238   Sepsis Labs: @LABRCNTIP (procalcitonin:4,lacticidven:4) )No results found for this or any previous visit (from the past 240 hour(s)).   Radiological Exams on Admission: Dg Chest Port 1 View  Result Date: 03/15/2016 CLINICAL DATA:  83 y/o  M; shortness of breath. EXAM: PORTABLE CHEST 1 VIEW COMPARISON:  01/31/2014 chest radiograph FINDINGS: Stable normal cardiac silhouette. Aortic  atherosclerosis with calcification. Biapical pleuroparenchymal scarring. Bronchitic changes in the left lung base. No focal consolidation. No pleural effusion. Bones are unremarkable. IMPRESSION: Bronchitic changes in the left lung base. No focal consolidation or pleural effusion. Electronically Signed   By: Kristine Garbe M.D.   On: 03/15/2016 04:45    EKG: (Independently reviewed) Sinus tachycardia with deep S waves in inferior leads with slow R-wave rotation but no ischemic changes. Ventricular rate 113 bpm, QTC 445 ms. Unchanged from previous EKG  Assessment/Plan Principal Problem:   Rectal bleeding -Patient presents with acute onset of rectal bleeding associated with abdominal bloating, excess flatus and suprapubic abdominal pain. -No prior episodes -Daily use of Goody powder in combination with PPI -Differential includes NSAIDs-induced ulcer/gastritis vs diverticular bleed vs ischemic colitis -Lactic acid; H pylori serologies **LACTIC ACID 2.9/REPEAT AT 1200 PM -Hold Plavix -Protonix 40 mg IV q 12 hrs -Hgb at 12 PM and again in a.m.-sooner if bleeding becomes more brisk -GI consultation -Hemoglobin stable at baseline of 14.7 -Due to suprapubic pain for completeness of exam check urinalysis and culture  Active Problems:   COPD (chronic obstructive pulmonary disease)  -Had episode of exacerbation at home precipitated by excessive ambulation and attempt to relieve abdominal symptoms with the degree of anxiety component -At home took several doses of pro-air -EMS administered DuoNeb 2, Solu Medrol 125 mg, magnesium sulfate 2 g -Pulse oximetry 91% with increased work of breathing in the field that initially required CPAP but upon arrival to ER stable on room air and remained stable without wheezing -Continue home nebs and MDI    Acute hyperglycemia -Glucose > 200 -Likely 2/2 Solu-Medrol -HgbA1c    PVD S/P angioplasty with stent of ostium of Lt. common iliac artery and PTA  and stent of Lt external and common femoral arteries. 08/19/11. -Currently denies claudication symptoms -Holding Plavix and setting of GI bleed symptoms -Follows with Dr. Gwenlyn Found in the OP setting    HTN (hypertension) -Currently controlled -Holding home Diovan, Bystolic and Norvasc in setting of GI bleeding and potential for development of hypotension    Normal coronary arteries- 2004, negative Myoview 2012 -Denied chest pain    Abdominal aortic aneurysm  -Last documented measurement  was 4.4 cm -Also followed by Dr. Gwenlyn Found    S/P arterial stent, to Lt renal art. 11/02/12 -Plavix on hold as above -Renal function stable and at baseline with creatinine 1.34    Hyperlipidemia -NPO holding Lipitor   BPH -Continue Proscar and Flomax -UA/cx as above      DVT prophylaxis: SCDs Code Status: Full Family Communication: Wife at bedside Disposition Plan: Anticipate discharge back to preadmission home environment when medically stable Consults called: Gastroenterology/[place]    Samella Parr ANP-BC [facility] Pager [phone]   If 7PM-7AM, please contact night-coverage [email or web address] Password [facility]  03/15/2016, 7:48 AM

## 2016-03-15 NOTE — Consult Note (Signed)
[facility] Consult: 8:37 AM 03/15/2016  LOS: 0 days    Referring Provider: Dr Marily Memos  Primary Care Physician:  [name], MD Primary Gastroenterologist:  Althia Forts.       Reason for Consultation:  Maroon stools.     HPI: Douglas Monroe is a 83 y.o. male.  PMH PVD on chronic Plavix.  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 S/p bilateral iliac stent and recanalization of right SFA.  S/p left reanl artery stent. Doppler in 04/2014: stable AAA.  Ureteral stones, s/p urologic stent. COPD.  Htn.  HLD.   Pt has never undergone colonoscopy or upper endoscopy. His father was diagnosed in his early 10 with colon cancer and this was the cause of his death.  Early this morning the patient woke up and felt pain in his lower abdomen. He thought it was gas and felt like he needed to move his bowels. He was able to pass gas, however when he got to the commode he was unable to move his bowels.  He then became progressively short of breath, much worse than usual. He tried using albuterol inhaler, without improvement.  EMS was contacted and arrived. At arrival he was back on the commode and had passed a soft to liquid, bloody stool. The abdominal pain had resolved. There was no nausea. On arrival to the ED he had one more episode of bloody stool. There still was no recurrent abdominal pain or nausea. Patient doesn't normally see even minor amounts of BPR. In addition to daily Plavix, he also takes a single goody powders daily for history of headaches. But he takes daily Protonix which almost  completely eradicate his heartburn. No dysphagia. No recent anorexia or weight loss.   Hgb 14.7.  MCV 94.  BUN is not elevated.  His dyspnea has improved.     Past Medical History:  Diagnosis Date  . Abdominal aortic aneurysm ([place])    infrarenal -- monitored by dr berry (note states stable 4.0 to 4.1cm)  . Arthritis    hands  . COPD with emphysema ([place])   . Exertional dyspnea   . First degree heart block   . GERD (gastroesophageal reflux disease)   . Headache(784.0)    uses goody powder  . HOH (hard of hearing)    left ear; no hearing aid   . HTN (hypertension) 08/20/2011  . Hyperlipidemia   . Hypertension   . Left carotid artery stenosis    moderate  . 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 PVD (peripheral vascular disease) with claudication [facility]) cardiologist-  dr berry   s/p bilateral iliac stents and right sfa stenting/  doppper study 09-05-2012  revealed ABIs close to one bilaterally with patent stents/  stenting left renal artery stenosis   . Right ureteral stone   . S/P  arterial stent    left renal angioplasty and stent 11-02-2012  . Sleep apnea    at risk for OSA. stop/bang score= 6; sent to PCP 10/04/13    Past Surgical History:  Procedure Laterality Date  . ABDOMINAL ANGIOGRAM  08/19/2011   Left common iliac artery at the ostium, 6x57mm ICAST covered stent, common femoral artery, 8x155mm Abbott absolute pro nitinol self-expanding stent and resulting in reduction of 80 and 90% stenosis to 0% residual  . ABDOMINAL ANGIOGRAM  10/08/2010   Mid-right SFA, 6x120 EV3 Protege nitinol self-expanding stent, resulting in reduction of CTO to 0% residual  . ABDOMINAL ANGIOGRAM  06/22/2010   Rt Common Femoral Artery-8x3 Absolute Pro Nitinol self-expanding stent-90% stenosis to 0% residual; Rt External Iliac Artery-9x3 Absolute Pro Nitinol self-expanding stent-70% stenosis to 0% residual; Left External Iliac Artery-8x4 Absolute Pro Nitinol self-expanding  stent-90% to 0% residual  . ATHERECTOMY N/A 08/19/2011   Procedure: ATHERECTOMY;  Surgeon: Lorretta Harp, MD;  Location: [facility] CATH LAB;  Service: Cardiovascular;  Laterality: N/A;  . CARDIAC CATHETERIZATION  10/26/2002  dr Tami Ribas   normal coronary arteries/ normal  LVF  . CARDIOVASCULAR STRESS TEST  06-12-2010   dr berry   normal perfusion study/ no ischemia/  ef 69%  . CYSTOSCOPY WITH RETROGRADE PYELOGRAM, URETEROSCOPY AND STENT PLACEMENT Right 10/10/2013   Procedure: uretheral dilation, CYSTOSCOPY, URETEROSCOPY, stone extraction, STENT PLACEMENT;  Surgeon: [name], MD;  Location: [facility];  Service: Urology;  Laterality: Right;  . HOLMIUM LASER APPLICATION Right [id number]   Procedure: HOLMIUM LASER APPLICATION;  Surgeon: [name], MD;  Location: [facility];  Service: Urology;  Laterality: Right;  . LUMBAR DISC SURGERY  1970's  . RENAL ANGIOGRAM N/A 11/02/2012   Procedure: RENAL ANGIOGRAM;  Surgeon: Lorretta Harp, MD;  Location: [facility] CATH LAB;  Service: Cardiovascular;  Laterality: N/A;  . RENAL ARTERY STENT Left 11/02/2012  . SOFT TISSUE CYST EXCISION  1960's   "outside part of lung LLL"    Prior to Admission medications   Medication Sig Start Date End Date Taking? Authorizing Provider  albuterol (PROVENTIL HFA;VENTOLIN HFA) 108 (90 BASE) MCG/ACT inhaler Inhale 2 puffs into the lungs every 6 (six) hours as needed for shortness of breath.    Yes Historical Provider, MD  amLODipine (NORVASC) 5 MG tablet Take 5 mg by mouth every evening.   Yes Historical Provider, MD  atorvastatin (LIPITOR) 20 MG tablet TAKE 1 TABLET BY MOUTH  DAILY AT 6 PM. 01/14/16  Yes Lorretta Harp, MD  BYSTOLIC 10 MG tablet Take 10 mg by mouth daily. 02/23/16  Yes Historical Provider, MD  clopidogrel (PLAVIX) 75 MG tablet TAKE 1 TABLET BY MOUTH  EVERY DAY 01/14/16  Yes Lorretta Harp, MD  [employer] D3 2000 units CAPS Take 1 capsule by mouth daily. 01/31/16  Yes Historical  Provider, MD  finasteride (PROSCAR) 5 MG tablet Take 5 mg by mouth every morning.   Yes Historical Provider, MD  pantoprazole (PROTONIX) 40 MG tablet Take 40 mg by mouth daily. 01/21/16  Yes Historical Provider, MD  tamsulosin (FLOMAX) 0.4 MG CAPS capsule Take 1 capsule by mouth daily. 07/13/13  Yes Historical Provider, MD  tiotropium (SPIRIVA) 18 MCG inhalation capsule Place 18 mcg into inhaler and inhale daily.   Yes Historical Provider, MD  valsartan (DIOVAN) 160 MG tablet Take 1 tablet by mouth daily. 07/22/14  Yes Historical Provider, MD    Scheduled Meds: . finasteride  5 mg Oral  q morning - 10a  . pantoprazole (PROTONIX) IV  40 mg Intravenous Q12H  . sodium chloride flush  3 mL Intravenous Q12H  . tamsulosin  0.4 mg Oral Daily  . tiotropium  18 mcg Inhalation Daily   Infusions: . sodium chloride     PRN Meds: acetaminophen **OR** acetaminophen, albuterol, ipratropium-albuterol, ondansetron **OR** ondansetron (ZOFRAN) IV   Allergies as of 03/15/2016 - Review Complete 03/15/2016  Allergen Reaction Noted  . Pneumococcal vaccines Swelling 10/04/2013    Family History  Problem Relation Age of Onset  . Hypertension Mother   . Cancer Father   . Diabetes Brother   . Hypertension Son     Social History   Social History  . Marital status: Married    Spouse name: N/A  . Number of children: N/A  . Years of education: N/A   Occupational History  . Not on file.   Social History Main Topics  . Smoking status: Former Smoker    Packs/day: 1.00    Years: 60.00    Types: Cigarettes    Quit date: 06/02/2010  . Smokeless tobacco: Never Used  . Alcohol use No  . Drug use: No  . Sexual activity: Not on file   Other Topics Concern  . Not on file   Social History Narrative  . No narrative on file    REVIEW OF SYSTEMS: Constitutional:  No profound weakness in general. ENT:  No nose bleeds.  No teeth, does have an upper denture which she doesn't wear very often. It's easier  her for him to chew without the denture in place. Pulm:  Per HPI.  Cough productive of clear, frothy sputum.  Does not require use of oxygen at home. CV:  No palpitations, no LE edema.  No chest pain GU:  No hematuria, no frequency GI:  Per HPI.  No dysphagia, in fact he can eat steak without problems. Infrequently will use Tums for breakthrough heartburn, this problem has not become accelerated in the last few weeks. Heme:   Denies excessive bleeding. He does develop purpura fairly easily on his arms.   Transfusions:  Denies previous transfusions. Neuro:  No headaches, no peripheral tingling or numbness Derm:  No itching, no rash or sores.  Endocrine:  No sweats or chills.  No polyuria or dysuria Immunization:  He is current on his influenza vaccination. Travel:  None beyond local counties in last few months.    PHYSICAL EXAM: Vital signs in last 24 hours: Vitals:   03/15/16 0730 03/15/16 0745  BP: 150/74 159/77  Pulse: 91 93  Resp: 17 16  Temp:     Wt Readings from Last 3 Encounters:  07/30/15 70.3 kg (155 lb)  07/31/14 69.1 kg (152 lb 4.8 oz)  01/31/14 67.8 kg (149 lb 8 oz)   General: Somewhat frail appearing, elderly WF in. He is comfortable. Head:  No facial asymmetry or swelling.  Eyes:  No conjunctival pallor. Ears:  Some HOH.  Nose:  No congestion or discharge. No nasal cannula oxygen in place. Mouth:  Edentulous.  Oral mucosa is moist, pink and clear. Tongue is midline. Neck:  No masses, JVD or thyromegaly. Lungs:  Diminished breath sounds bilaterally. Some dyspnea with prolonged speaking. Some loose sounding cough. Heart: RRR. No MRG. S1, S2 present. Abdomen:  Soft. No HSM, masses, bruits, hernias. Not tender. Bowel sounds normoactive..   Rectal: Small amount of red blood seen on exam glove. No masses. Prostate firm, palpable and without masses.  Musc/Skeltl: No joint erythema, gross deformities or swelling. Extremities:  CCE.  Neurologic:  Oriented times 3. Fully  alert. Very precise and detailed historian. No tremor, no limb weakness. Skin:  No rashes or sores. Nodes:  No cervical or inguinal adenopathy.   Psych:  Pleasant, calm, cooperative.  Intake/Output from previous day: No intake/output data recorded. Intake/Output this shift: No intake/output data recorded.  LAB RESULTS:  Recent Labs  03/15/16 0433  WBC 13.2*  HGB 14.7  HCT 44.3  PLT 180   BMET Lab Results  Component Value Date   NA 143 03/15/2016   NA 142 01/31/2014   NA 143 10/10/2013   K 3.4 (L) 03/15/2016   K 3.9 01/31/2014   K 3.8 10/10/2013   CL 107 03/15/2016   CL 111 01/31/2014   CL 104 04/24/2013   CO2 26 03/15/2016   CO2 22 01/31/2014   CO2 26 04/24/2013   GLUCOSE 205 (H) 03/15/2016   GLUCOSE 101 (H) 01/31/2014   GLUCOSE 101 (H) 10/10/2013   BUN 19 03/15/2016   BUN 18 01/31/2014   BUN 22 04/24/2013   CREATININE 1.34 (H) 03/15/2016   CREATININE 1.24 01/31/2014   CREATININE 0.97 04/24/2013   CALCIUM 9.2 03/15/2016   CALCIUM 9.5 01/31/2014   CALCIUM 9.6 04/24/2013   LFT  Recent Labs  03/15/16 0433  PROT 6.5  ALBUMIN 3.6  AST 22  ALT 15*  ALKPHOS 98  BILITOT 0.9   PT/INR Lab Results  Component Value Date   INR 0.89 10/26/2012   INR 0.89 10/08/2010   Hepatitis Panel No results for input(s): HEPBSAG, HCVAB, HEPAIGM, HEPBIGM in the last 72 hours. C-Diff No components found for: CDIFF Lipase     Component Value Date/Time   LIPASE 19 03/15/2016 0433    Drugs of Abuse  No results found for: LABOPIA, COCAINSCRNUR, LABBENZ, AMPHETMU, THCU, LABBARB   RADIOLOGY STUDIES: Dg Chest Port 1 View  Result Date: 03/15/2016 CLINICAL DATA:  83 y/o  M; shortness of breath. EXAM: PORTABLE CHEST 1 VIEW COMPARISON:  01/31/2014 chest radiograph FINDINGS: Stable normal cardiac silhouette. Aortic atherosclerosis with calcification. Biapical pleuroparenchymal scarring. Bronchitic changes in the left lung base. No focal consolidation. No pleural effusion.  Bones are unremarkable. IMPRESSION: Bronchitic changes in the left lung base. No focal consolidation or pleural effusion. Electronically Signed   By: Kristine Garbe M.D.   On: 03/15/2016 04:45    IMPRESSION:   *  Hematochezia, initially had abdominal pain but this is resolved. Rule out ischemic colitis, rule out neoplasm, rule out AVMs, rule out diverticular bleed. Patient has never had colonoscopy.  His dad died with colon cancer in his early to mid 72s.    *  COPD. Acute, worse than normal dyspnea this morning at home, has improved. Chest x-ray shows left lung bronchitic changes.  *  ASPVD with history of peripheral vascular stenting.  On chronic Plavix.   PLAN:     *  Needs colonoscopy but wash out for Plavix prior to procedure is 5 days and he last took Plavix on 2/11.  *  Allow patient to eat, no reason to restrict him to only clear liquids as this is a lower GI bleed and it's going to be at least a few days before we get to a colon prep and colonoscopy.  *  CBC ordered for noon today and tomorrow morning.  *  Continue home dose of Protonix PO once daily.   Azucena Freed  03/15/2016, 8:37 AM Pager:  [phone]     

## 2016-03-15 NOTE — Progress Notes (Addendum)
CRITICAL VALUE STICKER  CRITICAL VALUE: lactic Acid 2.9  RECEIVER (on-site recipient of call): [place] NOTIFIED: [id number] 03/15/16  MESSENGER (representative from lab):  MD NOTIFIED: Dr. Saralyn Pilar   TIME OF NOTIFICATION: 03/15/16 0939  RESPONSE: Lactic acid-lab @12 

## 2016-03-15 NOTE — Care Management Note (Signed)
Case Management Note  Patient Details  Name: Douglas Monroe MRN: [name] Date of Birth: 12/17/1932  Subjective/Objective:                 Independent patient from home with wife in obs for GIB, resp distress. RA. per GI note needs 5 day wash out for plavix prior to scope, patient would like to follow up as outpatient. No CM needs identified at this time.    Action/Plan:   Expected Discharge Date:                  Expected Discharge Plan:  Home/Self Care  In-House Referral:     Discharge planning Services  CM Consult  Post Acute Care Choice:    Choice offered to:     DME Arranged:    DME Agency:     HH Arranged:    HH Agency:     Status of Service:  In process, will continue to follow  If discussed at Long Length of Stay Meetings, dates discussed:    Additional Comments:  Carles Collet, RN 03/15/2016, 3:40 PM

## 2016-03-15 NOTE — Care Management CC44 (Signed)
Condition Code 44 Documentation Completed  Patient Details  Name: Douglas Monroe MRN: [id number] Date of Birth: 04/03/1932   Condition Code 44 given:  Yes Patient signature on Condition Code 44 notice:  Yes Documentation of 2 MD's agreement:  Yes Code 44 added to claim:  Yes    Carles Collet, RN 03/15/2016, 2:53 PM

## 2016-03-16 ENCOUNTER — Encounter (HOSPITAL_COMMUNITY): Payer: Self-pay | Admitting: Radiology

## 2016-03-16 ENCOUNTER — Observation Stay (HOSPITAL_COMMUNITY): Payer: Medicare Other

## 2016-03-16 DIAGNOSIS — Z7902 Long term (current) use of antithrombotics/antiplatelets: Secondary | ICD-10-CM | POA: Diagnosis not present

## 2016-03-16 DIAGNOSIS — K625 Hemorrhage of anus and rectum: Secondary | ICD-10-CM | POA: Diagnosis not present

## 2016-03-16 DIAGNOSIS — Z800 Family history of malignant neoplasm of digestive organs: Secondary | ICD-10-CM | POA: Diagnosis not present

## 2016-03-16 DIAGNOSIS — R739 Hyperglycemia, unspecified: Secondary | ICD-10-CM | POA: Diagnosis not present

## 2016-03-16 DIAGNOSIS — Z87442 Personal history of urinary calculi: Secondary | ICD-10-CM | POA: Diagnosis not present

## 2016-03-16 DIAGNOSIS — I10 Essential (primary) hypertension: Secondary | ICD-10-CM

## 2016-03-16 DIAGNOSIS — N400 Enlarged prostate without lower urinary tract symptoms: Secondary | ICD-10-CM | POA: Diagnosis present

## 2016-03-16 DIAGNOSIS — H9190 Unspecified hearing loss, unspecified ear: Secondary | ICD-10-CM | POA: Diagnosis present

## 2016-03-16 DIAGNOSIS — E785 Hyperlipidemia, unspecified: Secondary | ICD-10-CM | POA: Diagnosis present

## 2016-03-16 DIAGNOSIS — T380X5A Adverse effect of glucocorticoids and synthetic analogues, initial encounter: Secondary | ICD-10-CM | POA: Diagnosis present

## 2016-03-16 DIAGNOSIS — Z79899 Other long term (current) drug therapy: Secondary | ICD-10-CM | POA: Diagnosis not present

## 2016-03-16 DIAGNOSIS — R109 Unspecified abdominal pain: Secondary | ICD-10-CM | POA: Diagnosis not present

## 2016-03-16 DIAGNOSIS — J430 Unilateral pulmonary emphysema [MacLeod's syndrome]: Secondary | ICD-10-CM | POA: Diagnosis not present

## 2016-03-16 DIAGNOSIS — Z9889 Other specified postprocedural states: Secondary | ICD-10-CM | POA: Diagnosis not present

## 2016-03-16 DIAGNOSIS — J441 Chronic obstructive pulmonary disease with (acute) exacerbation: Secondary | ICD-10-CM | POA: Diagnosis not present

## 2016-03-16 DIAGNOSIS — K921 Melena: Secondary | ICD-10-CM | POA: Diagnosis present

## 2016-03-16 DIAGNOSIS — Z87891 Personal history of nicotine dependence: Secondary | ICD-10-CM | POA: Diagnosis not present

## 2016-03-16 DIAGNOSIS — Z8249 Family history of ischemic heart disease and other diseases of the circulatory system: Secondary | ICD-10-CM | POA: Diagnosis not present

## 2016-03-16 DIAGNOSIS — G4730 Sleep apnea, unspecified: Secondary | ICD-10-CM | POA: Diagnosis present

## 2016-03-16 DIAGNOSIS — Z887 Allergy status to serum and vaccine status: Secondary | ICD-10-CM | POA: Diagnosis not present

## 2016-03-16 DIAGNOSIS — I714 Abdominal aortic aneurysm, without rupture: Secondary | ICD-10-CM | POA: Diagnosis not present

## 2016-03-16 DIAGNOSIS — I70209 Unspecified atherosclerosis of native arteries of extremities, unspecified extremity: Secondary | ICD-10-CM | POA: Diagnosis present

## 2016-03-16 DIAGNOSIS — Z833 Family history of diabetes mellitus: Secondary | ICD-10-CM | POA: Diagnosis not present

## 2016-03-16 DIAGNOSIS — K219 Gastro-esophageal reflux disease without esophagitis: Secondary | ICD-10-CM | POA: Diagnosis present

## 2016-03-16 LAB — COMPREHENSIVE METABOLIC PANEL
ALT: 15 U/L — ABNORMAL LOW (ref 17–63)
ANION GAP: 7 (ref 5–15)
AST: 18 U/L (ref 15–41)
Albumin: 3 g/dL — ABNORMAL LOW (ref 3.5–5.0)
Alkaline Phosphatase: 60 U/L (ref 38–126)
BILIRUBIN TOTAL: 0.5 mg/dL (ref 0.3–1.2)
BUN: 18 mg/dL (ref 6–20)
CO2: 23 mmol/L (ref 22–32)
Calcium: 8.7 mg/dL — ABNORMAL LOW (ref 8.9–10.3)
Chloride: 110 mmol/L (ref 101–111)
Creatinine, Ser: 1.1 mg/dL (ref 0.61–1.24)
GFR calc Af Amer: >60 mL/min (ref >60)
Glucose, Bld: 106 mg/dL — ABNORMAL HIGH (ref 65–99)
POTASSIUM: 4.1 mmol/L (ref 3.5–5.1)
Sodium: 140 mmol/L (ref 135–145)
TOTAL PROTEIN: 5.6 g/dL — AB (ref 6.5–8.1)

## 2016-03-16 LAB — CBC
HCT: 40.6 % (ref 39.0–52.0)
HEMATOCRIT: 37.3 % — AB (ref 39.0–52.0)
HEMOGLOBIN: 12.5 g/dL — AB (ref 13.0–17.0)
HEMOGLOBIN: 13.3 g/dL (ref 13.0–17.0)
MCH: 31.3 pg (ref 26.0–34.0)
MCH: 30.6 pg (ref 26.0–34.0)
MCHC: 33.5 g/dL (ref 30.0–36.0)
MCHC: 32.8 g/dL (ref 30.0–36.0)
MCV: 93.5 fL (ref 78.0–100.0)
MCV: 93.3 fL (ref 78.0–100.0)
PLATELETS: 161 10*3/uL (ref 150–400)
Platelets: 151 10*3/uL (ref 150–400)
RBC: 3.99 MIL/uL — ABNORMAL LOW (ref 4.22–5.81)
RBC: 4.35 MIL/uL (ref 4.22–5.81)
RDW: 14.5 % (ref 11.5–15.5)
RDW: 14.2 % (ref 11.5–15.5)
WBC: 10.7 10*3/uL — AB (ref 4.0–10.5)
WBC: 11.8 10*3/uL — AB (ref 4.0–10.5)

## 2016-03-16 LAB — H PYLORI, IGM, IGG, IGA AB: H Pylori IgG: <0.8 Index Value (ref 0.00–0.79)

## 2016-03-16 LAB — HEMOGLOBIN A1C
HEMOGLOBIN A1C: 5.8 % — AB (ref 4.8–5.6)
MEAN PLASMA GLUCOSE: 120 mg/dL

## 2016-03-16 LAB — URINE CULTURE: Culture: <10000 — AB

## 2016-03-16 MED ORDER — ALBUTEROL SULFATE (2.5 MG/3ML) 0.083% IN NEBU
2.5000 mg | INHALATION_SOLUTION | Freq: Four times a day (QID) | RESPIRATORY_TRACT | Status: DC
  Administered 2016-03-16 – 2016-03-17 (×4): 2.5 mg via RESPIRATORY_TRACT
  Filled 2016-03-16 (×4): qty 3

## 2016-03-16 MED ORDER — METRONIDAZOLE IN NACL 5-0.79 MG/ML-% IV SOLN
500.0000 mg | Freq: Three times a day (TID) | INTRAVENOUS | Status: DC
  Administered 2016-03-16 – 2016-03-17 (×4): 500 mg via INTRAVENOUS
  Filled 2016-03-16 (×3): qty 100

## 2016-03-16 MED ORDER — IOPAMIDOL (ISOVUE-300) INJECTION 61%
INTRAVENOUS | Status: AC
  Administered 2016-03-16: 100 mL
  Filled 2016-03-16: qty 100

## 2016-03-16 MED ORDER — CIPROFLOXACIN IN D5W 400 MG/200ML IV SOLN
400.0000 mg | Freq: Two times a day (BID) | INTRAVENOUS | Status: DC
  Administered 2016-03-16 – 2016-03-17 (×2): 400 mg via INTRAVENOUS
  Filled 2016-03-16 (×2): qty 200

## 2016-03-16 MED ORDER — IOPAMIDOL (ISOVUE-300) INJECTION 61%: 15.0000 mL | INTRAVENOUS | Status: AC

## 2016-03-16 MED ORDER — METHYLPREDNISOLONE SODIUM SUCC 125 MG IJ SOLR
60.0000 mg | Freq: Two times a day (BID) | INTRAMUSCULAR | Status: DC
  Administered 2016-03-16 – 2016-03-17 (×3): 60 mg via INTRAVENOUS
  Filled 2016-03-16 (×3): qty 2

## 2016-03-16 NOTE — Progress Notes (Signed)
Daily Rounding Note  03/16/2016, 10:14 AM  LOS: 1 day   SUBJECTIVE:   Chief complaint: passing BPR    Saw blood upon wiping up after otherwise normal, brown stool ~ 9:30 PM last night.   Acute dyspnea again this AM, relieved with Spiriva, IV Solumedrol.     OBJECTIVE:         Vital signs in last 24 hours:    Temp:  [98 F (36.7 C)-99.1 F (37.3 C)] 98 F (36.7 C) (02/13 0942) Pulse Rate:  [68-88] 68 (02/13 0942) Resp:  [16-19] 18 (02/13 0942) BP: (133-153)/(60-66) 133/64 (02/13 0942) SpO2:  [94 %-96 %] 94 % (02/13 0942) Weight:  [66.7 kg (147 lb)] 66.7 kg (147 lb) (02/12 2153) Last BM Date: 03/15/16 Filed Weights   03/15/16 2153  Weight: 66.7 kg (147 lb)   General: Chronically unwell, somewhat frail, comfortable and alert.   Heart: RRR. No MRG. Chest: Greatly diminished breath sounds but no adventitious sounds. Abdomen: Soft. Nontender. Not distended. Active bowel sounds.  Extremities: No CCE. Neuro/Psych:  Fully alert. Oriented times 3. Moves all 4 limbs, no tremor.  Intake/Output from previous day: 02/12 0701 - 02/13 0700 In: 2127.5 [P.O.:540; I.V.:1587.5] Out: 900 [Urine:900]  Intake/Output this shift: Total I/O In: 240 [P.O.:240] Out: 850 [Urine:850]  Lab Results:  Recent Labs  03/15/16 0433 03/15/16 1230 03/16/16 0646  WBC 13.2* 5.3 10.7*  HGB 14.7 13.4 12.5*  HCT 44.3 40.7 37.3*  PLT 180 150 151   BMET  Recent Labs  03/15/16 0433 03/16/16 0646  NA 143 140  K 3.4* 4.1  CL 107 110  CO2 26 23  GLUCOSE 205* 106*  BUN 19 18  CREATININE 1.34* 1.10  CALCIUM 9.2 8.7*   LFT  Recent Labs  03/15/16 0433 03/16/16 0646  PROT 6.5 5.6*  ALBUMIN 3.6 3.0*  AST 22 18  ALT 15* 15*  ALKPHOS 98 60  BILITOT 0.9 0.5   PT/INR No results for input(s): LABPROT, INR in the last 72 hours. Hepatitis Panel No results for input(s): HEPBSAG, HCVAB, HEPAIGM, HEPBIGM in the last 72  hours.  Studies/Results: Dg Chest Port 1 View  Result Date: 03/15/2016 CLINICAL DATA:  83 y/o  M; shortness of breath. EXAM: PORTABLE CHEST 1 VIEW COMPARISON:  01/31/2014 chest radiograph FINDINGS: Stable normal cardiac silhouette. Aortic atherosclerosis with calcification. Biapical pleuroparenchymal scarring. Bronchitic changes in the left lung base. No focal consolidation. No pleural effusion. Bones are unremarkable. IMPRESSION: Bronchitic changes in the left lung base. No focal consolidation or pleural effusion. Electronically Signed   By: Kristine Garbe M.D.   On: 03/15/2016 04:45   Scheduled Meds: . albuterol  2.5 mg Nebulization Q6H  . finasteride  5 mg Oral q morning - 10a  . iopamidol  15 mL Oral Q1 Hr x 2  . methylPREDNISolone (SOLU-MEDROL) injection  60 mg Intravenous Q12H  . pantoprazole  40 mg Oral Q0600  . sodium chloride flush  3 mL Intravenous Q12H  . tamsulosin  0.4 mg Oral Daily  . tiotropium  18 mcg Inhalation Daily   Continuous Infusions: . sodium chloride 50 mL/hr at 03/16/16 0910   PRN Meds:.acetaminophen **OR** acetaminophen, ipratropium-albuterol, ondansetron **OR** ondansetron (ZOFRAN) IV   ASSESMENT:   *  Hematochezia, initially had abdominal pain but this is resolved. Rule out ischemic colitis, rule out neoplasm, rule out AVMs, rule out diverticular bleed. Patient has never had colonoscopy.  His dad died with colon cancer  in his early to mid 30s.    *  COPD. Acute On chronic. Chest x-ray shows left lung bronchitic changes.  *  ASPVD with history of peripheral vascular stenting.  On chronic Plavix, last dose was 2/11.   PLAN   *  Colonoscopy after 5 day Plavix washout, date: Friday 12/16.   Patient concerned about costs of his hospital stay if he has to stay until the end of the week. He understands the difference between observation and inpatient admissions and the associated costs. I advised him that Dr. Silverio Decamp is available to perform the  procedure on Friday whether or not he is an inpatient or if he is an outpatient. He understands that he will need to perform bowel prep and feels comfortable doing this at home if he is allowed discharge before Friday. He also says getting a ride over to endoscopy unit on Friday would not be a problem.    Azucena Freed  03/16/2016, 10:14 AM Pager: [phone]

## 2016-03-16 NOTE — Progress Notes (Signed)
[facility] PROGRESS NOTE  Douglas Monroe [id number] DOB: 12-Jan-1933 DOA: 03/15/2016  PCP: [name], MD  Brief History/Interval Summary: 83 year old Caucasian male with a past medical history of COPD, hypertension, abdominal aortic aneurysm, peripheral vascular disease status post multiple stents, presented with the lower abdominal pain and had a bloody bowel movement. He was hospitalized for further management.  Reason for Visit: Hematochezia. COPD exacerbation  Consultants: Gastroenterology  Procedures: None yet  Antibiotics: None  Subjective/Interval History: Patient states that he is feeling more short of breath than usual. Denies any chest pain. No cough. Has been wheezing. Denies any nausea, vomiting. No further episodes of bloody bowel movement, although he did see blood on the toilet paper.  ROS: Denies any headaches.  Objective:  Vital Signs  Vitals:   03/15/16 2025 03/15/16 2153 03/16/16 0508 03/16/16 0942  BP:  (!) 153/65 136/66 133/64  Pulse:  88 75 68  Resp:  17 19 18   Temp:  98 F (36.7 C) 99.1 F (37.3 C) 98 F (36.7 C)  TempSrc:    Oral  SpO2: 95% 96% 94% 94%  Weight:  66.7 kg (147 lb)      Intake/Output Summary (Last 24 hours) at 03/16/16 1204 Last data filed at 03/16/16 0945  Gross per 24 hour  Intake           2367.5 ml  Output             1575 ml  Net            792.5 ml   Filed Weights   03/15/16 2153  Weight: 66.7 kg (147 lb)    General appearance: alert, cooperative, appears stated age and no distress Resp: Patient noted to get dyspneic with increasing respiratory rate when he speaks. Patient has wheezing bilaterally. Air entry is poor bilaterally. No crackles. Cardio: regular rate and rhythm, S1, S2 normal, no murmur, click, rub or gallop GI: Abdomen is soft. Mild tenderness present in the left lower quadrant without any rebound, rigidity or guarding. No masses or organomegaly. Extremities: extremities normal,  atraumatic, no cyanosis or edema Neurologic: No focal deficits  Lab Results:  Data Reviewed: I have personally reviewed following labs and imaging studies  CBC:  Recent Labs Lab 03/15/16 0433 03/15/16 1230 03/16/16 0646  WBC 13.2* 5.3 10.7*  NEUTROABS 9.1* 5.0  --   HGB 14.7 13.4 12.5*  HCT 44.3 40.7 37.3*  MCV 94.1 92.3 93.5  PLT 180 150 [id number]    Basic Metabolic Panel:  Recent Labs Lab 03/15/16 0433 03/16/16 0646  NA 143 140  K 3.4* 4.1  CL 107 110  CO2 26 23  GLUCOSE 205* 106*  BUN 19 18  CREATININE 1.34* 1.10  CALCIUM 9.2 8.7*    GFR: CrCl cannot be calculated (Unknown ideal weight.).  Liver Function Tests:  Recent Labs Lab 03/15/16 0433 03/16/16 0646  AST 22 18  ALT 15* 15*  ALKPHOS 98 60  BILITOT 0.9 0.5  PROT 6.5 5.6*  ALBUMIN 3.6 3.0*     Recent Labs Lab 03/15/16 0433  LIPASE 19    HbA1C:  Recent Labs  03/15/16 0715  HGBA1C 5.8*     Radiology Studies: Dg Chest Port 1 View  Result Date: 03/15/2016 CLINICAL DATA:  83 y/o  M; shortness of breath. EXAM: PORTABLE CHEST 1 VIEW COMPARISON:  01/31/2014 chest radiograph FINDINGS: Stable normal cardiac silhouette. Aortic atherosclerosis with calcification. Biapical pleuroparenchymal scarring. Bronchitic changes in the left lung base. No  focal consolidation. No pleural effusion. Bones are unremarkable. IMPRESSION: Bronchitic changes in the left lung base. No focal consolidation or pleural effusion. Electronically Signed   By: Kristine Garbe M.D.   On: 03/15/2016 04:45     Medications:  Scheduled: . albuterol  2.5 mg Nebulization Q6H  . finasteride  5 mg Oral q morning - 10a  . iopamidol      . methylPREDNISolone (SOLU-MEDROL) injection  60 mg Intravenous Q12H  . pantoprazole  40 mg Oral Q0600  . sodium chloride flush  3 mL Intravenous Q12H  . tamsulosin  0.4 mg Oral Daily  . tiotropium  18 mcg Inhalation Daily   Continuous: . sodium chloride 50 mL/hr at 03/16/16 [id number]    [id number] **OR** acetaminophen, ipratropium-albuterol, ondansetron **OR** ondansetron (ZOFRAN) IV  Assessment/Plan:  Principal Problem:   Rectal bleeding Active Problems:   PVD S/P angioplasty with stent of ostium of Lt. common iliac artery and PTA and stent of Lt external and common femoral arteries. 08/19/11.   COPD (chronic obstructive pulmonary disease) (HCC)   HTN (hypertension)   Normal coronary arteries- 2004, negative Myoview 2012   Abdominal aortic aneurysm (HCC)   S/P arterial stent, to Lt renal art. 11/02/12   Hyperlipidemia   Acute hyperglycemia   COPD exacerbation (HCC)   Renal insufficiency   PVD (peripheral vascular disease) (HCC)    Rectal bleeding Patient presented with acute onset of rectal bleeding associated with abdominal bloating, excess flatus and suprapubic abdominal pain. Patient has never had a colonoscopy. Patient has some tenderness in left lower quadrant. Patient has had any imaging studies of his abdomen recently. Proceed with CT scan of the abdomen and pelvis. Patient also reported use of Goody powder. Seen by gastroenterology. He is on PPI. Plan is for colonoscopy once Plavix washes out of his system. Plavix on hold. Hemoglobin has dropped some. Continue to monitor.  Acute COPD (chronic obstructive pulmonary disease) exacerbation Patient continues to feel dyspneic. This is more than his baseline. He has wheezing bilaterally. He is noted to be tachypneic while talking. Give him scheduled nebulizer treatments. Initiate Solu-Medrol. Hold off on antibiotics for now. Chest x-ray did not show any acute findings.  Acute hyperglycemia HbA1c 5.8. Most likely hyperglycemia was secondary to steroids. Continue to monitor for now.  History of PVD S/P angioplasty with stent of ostium of Lt. common iliac artery and PTA and stent of Lt external and common femoral arteries. 08/19/11. Follows with Dr. Gwenlyn Found in the OP setting. Plavix is on hold  currently.  History of essential hypertension  Monitor blood pressures closely. Holding home medications in the setting of GI bleed.  Abdominal aortic aneurysm  Due to abdominal pain and rectal bleeding. Proceed with CT scan of the abdomen and pelvis. Hemodynamically he is stable.  S/P arterial stent, to Lt renal art. 11/02/12 Plavix on hold as above. Renal function stable and at baseline with creatinine 1.34  Hyperlipidemia -NPO holding Lipitor  BPH Continue Proscar and Flomax. UA does not suggest infection. Large hemoglobin was noted. Numerous to count RBCs. Wait on CT scan.  DVT Prophylaxis: SCDs    Code Status: Full code  Family Communication: Discussed with the patient  Disposition Plan: Management as outlined above    LOS: 1 day   [facility] Pager [phone] 03/16/2016, 12:04 PM  If 7PM-7AM, please contact night-coverage at [email or web address], password [facility]

## 2016-03-17 DIAGNOSIS — R739 Hyperglycemia, unspecified: Secondary | ICD-10-CM

## 2016-03-17 DIAGNOSIS — J430 Unilateral pulmonary emphysema [MacLeod's syndrome]: Secondary | ICD-10-CM

## 2016-03-17 DIAGNOSIS — I714 Abdominal aortic aneurysm, without rupture: Secondary | ICD-10-CM

## 2016-03-17 LAB — BASIC METABOLIC PANEL
ANION GAP: 10 (ref 5–15)
BUN: 19 mg/dL (ref 6–20)
CALCIUM: 8.7 mg/dL — AB (ref 8.9–10.3)
CO2: 25 mmol/L (ref 22–32)
Chloride: 104 mmol/L (ref 101–111)
Creatinine, Ser: 1.17 mg/dL (ref 0.61–1.24)
GFR, EST NON AFRICAN AMERICAN: 57 mL/min — AB (ref >60)
Glucose, Bld: 169 mg/dL — ABNORMAL HIGH (ref 65–99)
POTASSIUM: 3.9 mmol/L (ref 3.5–5.1)
Sodium: 139 mmol/L (ref 135–145)

## 2016-03-17 LAB — CBC
HEMATOCRIT: 36.9 % — AB (ref 39.0–52.0)
Hemoglobin: 12.2 g/dL — ABNORMAL LOW (ref 13.0–17.0)
MCH: 30.7 pg (ref 26.0–34.0)
MCHC: 33.1 g/dL (ref 30.0–36.0)
MCV: 92.9 fL (ref 78.0–100.0)
PLATELETS: 145 10*3/uL — AB (ref 150–400)
RBC: 3.97 MIL/uL — ABNORMAL LOW (ref 4.22–5.81)
RDW: 14.5 % (ref 11.5–15.5)
WBC: 9.2 10*3/uL (ref 4.0–10.5)

## 2016-03-17 MED ORDER — CIPROFLOXACIN HCL 500 MG PO TABS: 500.0000 mg | ORAL_TABLET | Freq: Two times a day (BID) | ORAL | 0 refills | Status: DC

## 2016-03-17 MED ORDER — VALSARTAN 160 MG PO TABS: 160.0000 mg | ORAL_TABLET | Freq: Every day | ORAL | 0 refills | Status: DC

## 2016-03-17 MED ORDER — METRONIDAZOLE 500 MG PO TABS: 500.0000 mg | ORAL_TABLET | Freq: Three times a day (TID) | ORAL | 0 refills | Status: AC

## 2016-03-17 MED ORDER — CIPROFLOXACIN HCL 500 MG PO TABS: 500.0000 mg | ORAL_TABLET | Freq: Two times a day (BID) | ORAL | 0 refills | Status: AC

## 2016-03-17 MED ORDER — PREDNISONE 50 MG PO TABS: 50.0000 mg | ORAL_TABLET | Freq: Every day | ORAL | 0 refills | Status: AC

## 2016-03-17 MED ORDER — METRONIDAZOLE 500 MG PO TABS: 500.0000 mg | ORAL_TABLET | Freq: Three times a day (TID) | ORAL | 0 refills | Status: DC

## 2016-03-17 MED ORDER — PREDNISONE 50 MG PO TABS: 50.0000 mg | ORAL_TABLET | Freq: Every day | ORAL | 0 refills | Status: DC

## 2016-03-17 NOTE — Consult Note (Signed)
            [facility] CM Primary Care Navigator  03/17/2016  Douglas Monroe 1932/05/26 [name]   Went to see patient at the bedside to identify possible discharge needs but he was discharged home per staff.  Primary care provider's office called [facility]) to notify of patient's discharge and need for post hospital follow-up and transition of care.  Made aware to refer patient to [facility] care management if deemed appropriate for services.  For additional questions please contact:  Edwena Felty A. Rahi Chandonnet, BSN, RN-BC [facility] PRIMARY CARE Navigator Cell: [phone]

## 2016-03-17 NOTE — Evaluation (Signed)
Physical Therapy Evaluation Patient Details Name: Douglas Monroe MRN: [id number] DOB: 09-Feb-1932 Today's Date: 03/17/2016   History of Present Illness  Douglas Monroe is a 83 y.o. male with medical history significant for COPD, hypertension, dyslipidemia, abdominal aortic aneurysm less than 5 cm, renal artery stent 2/2 RAS, peripheral vascular disease status post multiple left stents. Patient awakened early this morning with lower abdominal pain over the suprapubic region reporting he felt "gassy". Initial attempt to pass BM resulted in flatus only but diminished his pain. He was unable to pass a BM and he did not wish to strain. He made multiple laps by walking throughout the house back and forth to the bathroom without any results. Eventually he passed red/melanotic stool. Unfortunately due to on the excessive walking patient developed COPD exacerbation symptoms that did not respond to home Pro air. EMS was called to the home.  Clinical Impression  Pt admitted with above diagnosis. Pt currently with functional limitations due to the deficits listed below (see PT Problem List).  Pt will benefit from skilled PT to increase their independence and safety with mobility to allow discharge to the venue listed below.  Pt moving well overall with 2/4 dyspnea with o2 92-95% on room air with gait and stair training.  Pt may only need 1-2 more acute PT visits to meet goals. No follow up PT needed.     Follow Up Recommendations No PT follow up    Equipment Recommendations  None recommended by PT    Recommendations for Other Services       Precautions / Restrictions Precautions Precautions: None Restrictions Weight Bearing Restrictions: No      Mobility  Bed Mobility Overal bed mobility: Independent                Transfers Overall transfer level: Independent                  Ambulation/Gait Ambulation/Gait assistance: Supervision Ambulation Distance (Feet): 300 Feet Assistive  device: None Gait Pattern/deviations: Step-through pattern Gait velocity: decreased   General Gait Details: AMb with IV pole intially and then transitioned to no AD with S.  o2 92-95% on room air with 2/4 dyspnea.  Stairs Stairs: Yes Stairs assistance: Supervision Stair Management: One rail Right;Alternating pattern Number of Stairs: 4 General stair comments: Pt connected to IV, so unable to attempt full flight, but S with step through pattern on steps assessed  Wheelchair Mobility    Modified Rankin (Stroke Patients Only)       Balance Overall balance assessment: No apparent balance deficits (not formally assessed)                                           Pertinent Vitals/Pain Pain Assessment: No/denies pain    Home Living Family/patient expects to be discharged to:: Private residence Living Arrangements: Spouse/significant other Available Help at Discharge: Family;Available 24 hours/day Type of Home: House Home Access: Stairs to enter   [employer] of Steps: 1 Home Layout: Multi-level;Laundry or work area in basement;Able to live on main level with [employer]: None      Prior Function Level of Independence: Independent               Hand Dominance        Extremity/Trunk Assessment   Upper Extremity Assessment Upper Extremity Assessment: Overall WFL for tasks assessed  Lower Extremity Assessment Lower Extremity Assessment: Overall WFL for tasks assessed    Cervical / Trunk Assessment Cervical / Trunk Assessment: Normal  Communication   Communication: No difficulties  Cognition Arousal/Alertness: Awake/alert Behavior During Therapy: WFL for tasks assessed/performed Overall Cognitive Status: Within Functional Limits for tasks assessed                      General Comments      Exercises     Assessment/Plan    PT Assessment Patient needs continued PT services  PT Problem List  Decreased activity tolerance;Decreased balance;Decreased mobility;Cardiopulmonary status limiting activity          PT Treatment Interventions Gait training;Stair training;Functional mobility training;Therapeutic activities;Therapeutic exercise;Balance training    PT Goals (Current goals can be found in the Care Plan section)  Acute Rehab PT Goals Patient Stated Goal: home PT Goal Formulation: With patient Time For Goal Achievement: 03/22/16 Potential to Achieve Goals: Good    Frequency Min 3X/week   Barriers to discharge Inaccessible home environment      Co-evaluation               End of Session Equipment Utilized During Treatment: Gait belt Activity Tolerance: Patient tolerated treatment well Patient left: in chair;with call bell/phone within reach;Other (comment) (respiratory) Nurse Communication: Mobility status         Time: [id number] PT Time Calculation (min) (ACUTE ONLY): 27 min   Charges:   PT Evaluation $PT Eval Low Complexity: 1 Procedure PT Treatments $Gait Training: 8-22 mins   PT G Codes:        Alter,Jazlynne Milliner LUBECK 03/17/2016, 9:48 AM

## 2016-03-17 NOTE — Progress Notes (Signed)
Pt to discharge home with wife. Discharge instructions, medications and follow up appointments reviewed with wife and pt, verbalized understanding. IV discontinued, cath intact, site clean and dry. Telemetry discontinued, CCMD notified. Pt and wife was escorted out of the unit in wheelchair, took all belongings with them.

## 2016-03-17 NOTE — Progress Notes (Signed)
Plan is to discharge patient today. Colonoscopy set up for Friday February 16.   Verbally instructed patient that he should take Dulcolax once tonight and once tomorrow morning that is Thursday. On Thursday night he is going to begin the first stage of Suprep, the bowel prep.  Printed Suprep instructions given to the patient.  He must arrive at the endoscopy unit by 9 AM on Friday. He knows to begin clear liquids tomorrow. Nothing by mouth except for bowel prep after midnight tomorrow night.  Azucena Freed PA-C [id number].

## 2016-03-17 NOTE — Discharge Summary (Addendum)
Physician Discharge Summary  Douglas Monroe Douglas Monroe: [id number] DOB/AGE: 07/06/1932 83 y.o.  PCP: [name], MD   Admit date: 03/15/2016 Discharge date: 03/17/2016  Discharge Diagnoses:    Principal Problem:   Rectal bleeding Active Problems:   PVD S/P angioplasty with stent of ostium of Lt. common iliac artery and PTA and stent of Lt external and common femoral arteries. 08/19/11.   COPD (chronic obstructive pulmonary disease) (HCC)   HTN (hypertension)   Normal coronary arteries- 2004, negative Myoview 2012   Abdominal aortic aneurysm (HCC)   S/P arterial stent, to Lt renal art. 11/02/12   Hyperlipidemia   Acute hyperglycemia   COPD exacerbation (HCC)   Renal insufficiency   PVD (peripheral vascular disease) (HCC)   Left sided abdominal pain    Follow-up recommendations Follow-up with PCP in 3-5 days , including all  additional recommended appointments as below Follow-up CBC, CMP in 3-5 days Patient to have a colonoscopy on 2/16 with K Veena Nandigam, MDKavitha Nandigam V, MD  Resume Plavix after colonoscopy     Current Discharge Medication List    START taking these medications   Details  ciprofloxacin (CIPRO) 500 MG tablet Take 1 tablet (500 mg total) by mouth 2 (two) times daily. Qty: 14 tablet, Refills: 0    metroNIDAZOLE (FLAGYL) 500 MG tablet Take 1 tablet (500 mg total) by mouth 3 (three) times daily. Qty: 15 tablet, Refills: 0    predniSONE (DELTASONE) 50 MG tablet Take 1 tablet (50 mg total) by mouth daily with breakfast. Qty: 5 tablet, Refills: 0      CONTINUE these medications which have CHANGED   Details  valsartan (DIOVAN) 160 MG tablet Take 1 tablet (160 mg total) by mouth daily. Qty: 30 tablet, Refills: 0      CONTINUE these medications which have NOT CHANGED   Details  albuterol (PROVENTIL HFA;VENTOLIN HFA) 108 (90 BASE) MCG/ACT inhaler Inhale 2 puffs into the lungs every 6 (six) hours as needed for shortness of breath.     amLODipine  (NORVASC) 5 MG tablet Take 5 mg by mouth every evening.    atorvastatin (LIPITOR) 20 MG tablet TAKE 1 TABLET BY MOUTH  DAILY AT 6 PM. Qty: 90 tablet, Refills: 3    BYSTOLIC 10 MG tablet Take 10 mg by mouth daily.          [employer] D3 2000 units CAPS Take 1 capsule by mouth daily. Refills: 5    finasteride (PROSCAR) 5 MG tablet Take 5 mg by mouth every morning.    pantoprazole (PROTONIX) 40 MG tablet Take 40 mg by mouth daily. Refills: 5    tamsulosin (FLOMAX) 0.4 MG CAPS capsule Take 1 capsule by mouth daily.    tiotropium (SPIRIVA) 18 MCG inhalation capsule Place 18 mcg into inhaler and inhale daily.         Discharge Condition: Stable   Discharge Instructions Get Medicines reviewed and adjusted: Please take all your medications with you for your next visit with your Primary MD  Please request your Primary MD to go over all hospital tests and procedure/radiological results at the follow up, please ask your Primary MD to get all Hospital records sent to his/her office.  If you experience worsening of your admission symptoms, develop shortness of breath, life threatening emergency, suicidal or homicidal thoughts you must seek medical attention immediately by calling 911 or calling your MD immediately if symptoms less severe.  You must read complete instructions/literature along with all the possible adverse  reactions/side effects for all the Medicines you take and that have been prescribed to you. Take any new Medicines after you have completely understood and accpet all the possible adverse reactions/side effects.   Do not drive when taking Pain medications.   Do not take more than prescribed Pain, Sleep and Anxiety Medications  Special Instructions: If you have smoked or chewed Tobacco in the last 2 yrs please stop smoking, stop any regular Alcohol and or any Recreational drug use.  Wear Seat belts while driving.  Please note  You were cared for by a hospitalist during  your hospital stay. Once you are discharged, your primary care physician will handle any further medical issues. Please note that NO REFILLS for any discharge medications will be authorized once you are discharged, as it is imperative that you return to your primary care physician (or establish a relationship with a primary care physician if you do not have one) for your aftercare needs so that they can reassess your need for medications and monitor your lab values.  Discharge Instructions    Diet - low sodium heart healthy    Complete by:  As directed    Increase activity slowly    Complete by:  As directed        Allergies  Allergen Reactions  . Pneumococcal Vaccines Swelling    Lip swelling      Disposition: 01-Home or Self Care   Consults:  GI    Significant Diagnostic Studies:  Ct Abdomen Pelvis W Contrast  Result Date: 03/16/2016 CLINICAL DATA:  Left side abdominal pain beginning this morning. EXAM: CT ABDOMEN AND PELVIS WITH CONTRAST TECHNIQUE: Multidetector CT imaging of the abdomen and pelvis was performed using the standard protocol following bolus administration of intravenous contrast. CONTRAST:  129m ISOVUE-300 IOPAMIDOL (ISOVUE-300) INJECTION 61% COMPARISON:  None. FINDINGS: Lower chest: Left basilar scarring. Right lung base clear. No effusions. Heart is normal size. Hepatobiliary: No focal hepatic abnormality. Gallbladder unremarkable. Pancreas: No focal abnormality or ductal dilatation. Spleen: No focal abnormality.  Normal size. Adrenals/Urinary Tract: Benign-appearing cysts within the kidneys bilaterally, right larger than left. The largest is in the right lower pole measuring 3.4 cm. No hydronephrosis. Adrenal glands and urinary bladder unremarkable. Stomach/Bowel: The colonic wall appears mildly thickened in the distal transverse colon, splenic flexure and proximal descending colon, but this is decompressed and difficult to accurately evaluate. There is suggestion  of slight inflammation around this segment of colon. It is difficult to completely exclude colitis in this area. No evidence of bowel obstruction. Vascular/Lymphatic: Heavily calcified aorta with extensive plaque. Slight aneurysmal dilatation of the infrarenal aorta measuring up to 3.4 cm. No adenopathy. Reproductive: Enlarged prostate. Other: No free fluid or free air. Musculoskeletal: No acute bony abnormality or focal bone lesion. IMPRESSION: Mild apparent wall thickening within the colon from the distal transverse colon through the proximal descending colon. This area is decompressed and difficult to evaluate, but cannot exclude mild colitis. 3.4 cm infrarenal abdominal aortic aneurysm with extensive mural plaque. Left basilar scarring. Electronically Signed   By: KRolm BaptiseM.D.   On: 03/16/2016 13:35   Dg Chest Port 1 View  Result Date: 03/15/2016 CLINICAL DATA:  83y/o  M; shortness of breath. EXAM: PORTABLE CHEST 1 VIEW COMPARISON:  01/31/2014 chest radiograph FINDINGS: Stable normal cardiac silhouette. Aortic atherosclerosis with calcification. Biapical pleuroparenchymal scarring. Bronchitic changes in the left lung base. No focal consolidation. No pleural effusion. Bones are unremarkable. IMPRESSION: Bronchitic changes in the left lung  base. No focal consolidation or pleural effusion. Electronically Signed   By: Kristine Garbe M.D.   On: 03/15/2016 04:45        Filed Weights   03/15/16 2153 03/16/16 2108  Weight: 66.7 kg (147 lb) 68 kg (150 lb)     Microbiology: Recent Results (from the past 240 hour(s))  Urine culture     Status: Abnormal   Collection Time: 03/15/16  6:50 PM  Result Value Ref Range Status   Specimen Description URINE, RANDOM  Final   Special Requests NONE  Final   Culture <10,000 COLONIES/mL INSIGNIFICANT GROWTH (A)  Final   Report Status 03/16/2016 FINAL  Final       Blood Culture    Component Value Date/Time   SDES URINE, RANDOM 03/15/2016 1850    SPECREQUEST NONE 03/15/2016 1850   CULT <10,000 COLONIES/mL INSIGNIFICANT GROWTH (A) 03/15/2016 1850   REPTSTATUS 03/16/2016 FINAL 03/15/2016 1850      Labs: Results for orders placed or performed during the hospital encounter of 03/15/16 (from the past 48 hour(s))  CBC with Differential/Platelet     Status: Abnormal   Collection Time: 03/15/16 12:30 PM  Result Value Ref Range   WBC 5.3 4.0 - 10.5 K/uL   RBC 4.41 4.22 - 5.81 MIL/uL   Hemoglobin 13.4 13.0 - 17.0 g/dL   HCT 40.7 39.0 - 52.0 %   MCV 92.3 78.0 - 100.0 fL   MCH 30.4 26.0 - 34.0 pg   MCHC 32.9 30.0 - 36.0 g/dL   RDW 13.7 11.5 - 15.5 %   Platelets 150 150 - 400 K/uL   Neutrophils Relative % 94 %   Neutro Abs 5.0 1.7 - 7.7 K/uL   Lymphocytes Relative 5 %   Lymphs Abs 0.3 (L) 0.7 - 4.0 K/uL   Monocytes Relative 1 %   Monocytes Absolute 0.0 (L) 0.1 - 1.0 K/uL   Eosinophils Relative 0 %   Eosinophils Absolute 0.0 0.0 - 0.7 K/uL   Basophils Relative 0 %   Basophils Absolute 0.0 0.0 - 0.1 K/uL  Lactic acid, plasma     Status: None   Collection Time: 03/15/16 12:30 PM  Result Value Ref Range   Lactic Acid, Venous 1.6 0.5 - 1.9 mmol/L  Urine culture     Status: Abnormal   Collection Time: 03/15/16  6:50 PM  Result Value Ref Range   Specimen Description URINE, RANDOM    Special Requests NONE    Culture <10,000 COLONIES/mL INSIGNIFICANT GROWTH (A)    Report Status 03/16/2016 FINAL   Urinalysis, Routine w reflex microscopic     Status: Abnormal   Collection Time: 03/15/16  6:51 PM  Result Value Ref Range   Color, Urine YELLOW YELLOW   APPearance HAZY (A) CLEAR   Specific Gravity, Urine 1.015 1.005 - 1.030   pH 5.0 5.0 - 8.0   Glucose, UA NEGATIVE NEGATIVE mg/dL   Hgb urine dipstick LARGE (A) NEGATIVE   Bilirubin Urine NEGATIVE NEGATIVE   Ketones, ur NEGATIVE NEGATIVE mg/dL   Protein, ur NEGATIVE NEGATIVE mg/dL   Nitrite NEGATIVE NEGATIVE   Leukocytes, UA NEGATIVE NEGATIVE   RBC / HPF TOO NUMEROUS TO COUNT 0 -  5 RBC/hpf   WBC, UA 0-5 0 - 5 WBC/hpf   Bacteria, UA RARE (A) NONE SEEN   Squamous Epithelial / LPF NONE SEEN NONE SEEN  Comprehensive metabolic panel     Status: Abnormal   Collection Time: 03/16/16  6:46 AM  Result Value Ref Range  Sodium 140 135 - 145 mmol/L   Potassium 4.1 3.5 - 5.1 mmol/L   Chloride 110 101 - 111 mmol/L   CO2 23 22 - 32 mmol/L   Glucose, Bld 106 (H) 65 - 99 mg/dL   BUN 18 6 - 20 mg/dL   Creatinine, Ser 1.10 0.61 - 1.24 mg/dL   Calcium 8.7 (L) 8.9 - 10.3 mg/dL   Total Protein 5.6 (L) 6.5 - 8.1 g/dL   Albumin 3.0 (L) 3.5 - 5.0 g/dL   AST 18 15 - 41 U/L   ALT 15 (L) 17 - 63 U/L   Alkaline Phosphatase 60 38 - 126 U/L   Total Bilirubin 0.5 0.3 - 1.2 mg/dL   GFR calc non Af Amer >60 >60 mL/min   GFR calc Af Amer >60 >60 mL/min    Comment: (NOTE) The eGFR has been calculated using the CKD EPI equation. This calculation has not been validated in all clinical situations. eGFR's persistently <60 mL/min signify possible Chronic Kidney Disease.    Anion gap 7 5 - 15  CBC     Status: Abnormal   Collection Time: 03/16/16  6:46 AM  Result Value Ref Range   WBC 10.7 (H) 4.0 - 10.5 K/uL   RBC 3.99 (L) 4.22 - 5.81 MIL/uL   Hemoglobin 12.5 (L) 13.0 - 17.0 g/dL   HCT 37.3 (L) 39.0 - 52.0 %   MCV 93.5 78.0 - 100.0 fL   MCH 31.3 26.0 - 34.0 pg   MCHC 33.5 30.0 - 36.0 g/dL   RDW 14.5 11.5 - 15.5 %   Platelets 151 150 - 400 K/uL  CBC     Status: Abnormal   Collection Time: 03/16/16  1:16 PM  Result Value Ref Range   WBC 11.8 (H) 4.0 - 10.5 K/uL   RBC 4.35 4.22 - 5.81 MIL/uL   Hemoglobin 13.3 13.0 - 17.0 g/dL   HCT 40.6 39.0 - 52.0 %   MCV 93.3 78.0 - 100.0 fL   MCH 30.6 26.0 - 34.0 pg   MCHC 32.8 30.0 - 36.0 g/dL   RDW 14.2 11.5 - 15.5 %   Platelets 161 150 - 400 K/uL  CBC     Status: Abnormal   Collection Time: 03/17/16  4:48 AM  Result Value Ref Range   WBC 9.2 4.0 - 10.5 K/uL   RBC 3.97 (L) 4.22 - 5.81 MIL/uL   Hemoglobin 12.2 (L) 13.0 - 17.0 g/dL   HCT  36.9 (L) 39.0 - 52.0 %   MCV 92.9 78.0 - 100.0 fL   MCH 30.7 26.0 - 34.0 pg   MCHC 33.1 30.0 - 36.0 g/dL   RDW 14.5 11.5 - 15.5 %   Platelets 145 (L) 150 - 400 K/uL  Basic metabolic panel     Status: Abnormal   Collection Time: 03/17/16  4:48 AM  Result Value Ref Range   Sodium 139 135 - 145 mmol/L   Potassium 3.9 3.5 - 5.1 mmol/L   Chloride 104 101 - 111 mmol/L   CO2 25 22 - 32 mmol/L   Glucose, Bld 169 (H) 65 - 99 mg/dL   BUN 19 6 - 20 mg/dL   Creatinine, Ser 1.17 0.61 - 1.24 mg/dL   Calcium 8.7 (L) 8.9 - 10.3 mg/dL   GFR calc non Af Amer 57 (L) >60 mL/min   GFR calc Af Amer >60 >60 mL/min    Comment: (NOTE) The eGFR has been calculated using the CKD EPI equation. This calculation has  not been validated in all clinical situations. eGFR's persistently <60 mL/min signify possible Chronic Kidney Disease.    Anion gap 10 5 - 15     Lipid Panel     Component Value Date/Time   CHOL 113 03/06/2013 1106   TRIG 97 03/06/2013 1106   HDL 34 (L) 03/06/2013 1106   CHOLHDL 3.3 03/06/2013 1106   VLDL 19 03/06/2013 1106   LDLCALC 60 03/06/2013 1106     Lab Results  Component Value Date   HGBA1C 5.8 (H) Mar 19, 2016     Lab Results  Component Value Date   LDLCALC 60 03/06/2013   CREATININE 1.17 03/17/2016     HPI :  JAYVIEN ROWLETTE is a 83 y.o. male.  PMH PVD on chronic Plavix.  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 S/p bilateral iliac stent and recanalization of right SFA.  S/p left reanl artery stent. Doppler in 04/2014: stable AAA.  Ureteral stones, s/p urologic stent. COPD.  Htn.  HLD.   Pt has never undergone colonoscopy or upper endoscopy. His father was diagnosed in his early 82 with colon cancer and this was the cause of his  death.  Early  Morning 03-19-22  the patient woke up and felt pain in his lower abdomen. He thought it was gas and felt like he needed to move his bowels. He was able to pass gas, however when he got to the commode he was unable to move his bowels.  He then became progressively short of breath, much worse than usual. He tried using albuterol inhaler, without improvement.  EMS was contacted and arrived. At arrival he was back on the commode and had passed a soft to liquid, bloody stool. The abdominal pain had resolved. There was no nausea. On arrival to the ED he had one more episode of bloody stool. There still was no recurrent abdominal pain or nausea. Patient doesn't normally see even minor amounts of BPR.  HOSPITAL COURSE: *    Rectal bleeding, self-limiting Patient presented with acute onset of rectal bleeding associated with abdominal bloating, excess flatus and suprapubic abdominal pain. Patient has never had a colonoscopy. Patient has some tenderness in left lower quadrant. Patient has had any imaging studies of his abdomen recently.  CT scan of the abdomen and pelvis, showed possible colitis, patient started on ciprofloxacin and Flagyl, now transition to by mouth. Patient also reported use of Goody powder.  Plan is for colonoscopy once Plavix washes out of his system. Plavix on hold. Hemoglobin remained stable during this hospitalization around 12-13.We will schedule for colonoscopy on Friday, 01/16/17; ok to proceed from GI perspective as inpatient or outpatient   Acute COPD (chronic obstructive pulmonary disease) exacerbation Improved with symptomatic treatment. Ambulated on room air without difficulty and stayed above 92%. Give him scheduled nebulizer treatments. Started on Solu-Medrol. Switched over to oral prednisone 5 days. Chest x-ray did not show any acute findings.  Acute hyperglycemia HbA1c 5.8. Most likely hyperglycemia was secondary to steroids. Continue to monitor for  now.  History of PVD S/P angioplasty with stent of ostium of Lt. common iliac artery and PTA and stent of Lt external and common femoral arteries. 08/19/11. Follows with Dr. Gwenlyn Found in the OP setting. Plavix is on hold currently.  History of essential hypertension  Monitor blood pressures closely. Holding home medications in the setting of GI bleed.  Abdominal aortic aneurysm  Due to abdominal pain and rectal bleeding. Proceed with CT scan of the abdomen and pelvis. Hemodynamically he is stable.  S/P arterial stent, to Lt renal art. 11/02/12 Plavix on hold as above. Renal function stable and at baseline with creatinine 1.34  Hyperlipidemia -NPOholding Lipitor  BPH Continue Proscar and Flomax. UA does not suggest infection. Large hemoglobin was noted. Numerous to count RBCs.     Discharge Exam:  * Blood pressure 116/66, pulse 85, temperature 97.5 F (36.4 C), temperature source Oral, resp. rate 18, weight 68 kg (150 lb), SpO2 93 %.  General appearance: alert, cooperative, appears stated age and no distress Resp: Patient noted to get dyspneic with increasing respiratory rate when he speaks. Patient has wheezing bilaterally. Air entry is poor bilaterally. No crackles. Cardio: regular rate and rhythm, S1, S2 normal, no murmur, click, rub or gallop GI: Abdomen is soft. Mild tenderness present in the left lower quadrant without any rebound, rigidity or guarding. No masses or organomegaly. Extremities: extremities normal, atraumatic, no cyanosis or edema Neurologic: No focal deficits    Follow-up Information    [name], MD. Call.   Specialty:  Internal Medicine Why:  To make appointment, follow-up Contact information: [address] [place] [address] [phone]        Harl Bowie, MD. Call.   Specialty:  Gastroenterology Why:  TO CONFIRM LOCATION OF COLONOOPY AND AVAILABILITY OF PREP Contact information: [place]  [id number] [phone]           Signed: Reyne Dumas 03/17/2016, 11:09 AM        Time spent >45 mins

## 2016-03-18 ENCOUNTER — Encounter (HOSPITAL_COMMUNITY): Payer: Self-pay | Admitting: *Deleted

## 2016-03-18 NOTE — Progress Notes (Signed)
   03/18/16 1741  OBSTRUCTIVE SLEEP APNEA  Have you ever been diagnosed with sleep apnea through a sleep study? No  Do you snore loudly (loud enough to be heard through closed doors)?  1  Do you often feel tired, fatigued, or sleepy during the daytime (such as falling asleep during driving or talking to someone)? 0  Has anyone observed you stop breathing during your sleep? 1  Do you have, or are you being treated for high blood pressure? 1  BMI more than 35 kg/m2? 0  Age > 69 (1-yes) 1  Male Gender (Yes=1) 1  Obstructive Sleep Apnea Score 5

## 2016-03-18 NOTE — Progress Notes (Signed)
Douglas Monroe has been off Plaix since Sunday, February 11.Denies chest pain.  Has COPD and shortness of breath, "its better than it was Monday, the 12th. Mrs Ossman reports that patient's Blood pressure was low when he was in the hospital and he was not given blood pressure medications. I instructed Mrs Christman to bring bottles of Amlodipine and Bystolic with her to the hospital and they would decide if patient needs the medication.

## 2016-03-19 ENCOUNTER — Encounter (HOSPITAL_COMMUNITY): Payer: Self-pay | Admitting: *Deleted

## 2016-03-19 ENCOUNTER — Ambulatory Visit (HOSPITAL_COMMUNITY): Payer: Medicare Other | Admitting: Anesthesiology

## 2016-03-19 ENCOUNTER — Ambulatory Visit (HOSPITAL_COMMUNITY)
Admission: RE | Admit: 2016-03-19 | Discharge: 2016-03-19 | Disposition: A | Discharge status: Home / Self Care | Payer: Medicare Other | Source: Ambulatory Visit | Attending: Gastroenterology | Admitting: Gastroenterology

## 2016-03-19 ENCOUNTER — Telehealth: Payer: Self-pay | Admitting: Cardiovascular Disease

## 2016-03-19 ENCOUNTER — Encounter (HOSPITAL_COMMUNITY)
Admission: RE | Disposition: A | Discharge status: Home / Self Care | Payer: Self-pay | Source: Ambulatory Visit | Attending: Gastroenterology

## 2016-03-19 DIAGNOSIS — K219 Gastro-esophageal reflux disease without esophagitis: Secondary | ICD-10-CM | POA: Insufficient documentation

## 2016-03-19 DIAGNOSIS — E785 Hyperlipidemia, unspecified: Secondary | ICD-10-CM | POA: Diagnosis not present

## 2016-03-19 DIAGNOSIS — K559 Vascular disorder of intestine, unspecified: Secondary | ICD-10-CM

## 2016-03-19 DIAGNOSIS — J439 Emphysema, unspecified: Secondary | ICD-10-CM | POA: Diagnosis not present

## 2016-03-19 DIAGNOSIS — K529 Noninfective gastroenteritis and colitis, unspecified: Secondary | ICD-10-CM | POA: Insufficient documentation

## 2016-03-19 DIAGNOSIS — K635 Polyp of colon: Secondary | ICD-10-CM | POA: Diagnosis not present

## 2016-03-19 DIAGNOSIS — I10 Essential (primary) hypertension: Secondary | ICD-10-CM | POA: Insufficient documentation

## 2016-03-19 DIAGNOSIS — Z95820 Peripheral vascular angioplasty status with implants and grafts: Secondary | ICD-10-CM | POA: Diagnosis not present

## 2016-03-19 DIAGNOSIS — I6522 Occlusion and stenosis of left carotid artery: Secondary | ICD-10-CM | POA: Diagnosis not present

## 2016-03-19 DIAGNOSIS — I739 Peripheral vascular disease, unspecified: Secondary | ICD-10-CM | POA: Insufficient documentation

## 2016-03-19 DIAGNOSIS — Z87891 Personal history of nicotine dependence: Secondary | ICD-10-CM | POA: Insufficient documentation

## 2016-03-19 DIAGNOSIS — K625 Hemorrhage of anus and rectum: Secondary | ICD-10-CM | POA: Diagnosis not present

## 2016-03-19 DIAGNOSIS — J449 Chronic obstructive pulmonary disease, unspecified: Secondary | ICD-10-CM | POA: Diagnosis not present

## 2016-03-19 DIAGNOSIS — G4730 Sleep apnea, unspecified: Secondary | ICD-10-CM | POA: Insufficient documentation

## 2016-03-19 DIAGNOSIS — K633 Ulcer of intestine: Secondary | ICD-10-CM

## 2016-03-19 DIAGNOSIS — K921 Melena: Secondary | ICD-10-CM | POA: Diagnosis not present

## 2016-03-19 DIAGNOSIS — D120 Benign neoplasm of cecum: Secondary | ICD-10-CM | POA: Insufficient documentation

## 2016-03-19 DIAGNOSIS — Z800 Family history of malignant neoplasm of digestive organs: Secondary | ICD-10-CM | POA: Insufficient documentation

## 2016-03-19 DIAGNOSIS — Z85828 Personal history of other malignant neoplasm of skin: Secondary | ICD-10-CM | POA: Insufficient documentation

## 2016-03-19 DIAGNOSIS — D123 Benign neoplasm of transverse colon: Secondary | ICD-10-CM | POA: Diagnosis not present

## 2016-03-19 DIAGNOSIS — K5150 Left sided colitis without complications: Secondary | ICD-10-CM | POA: Diagnosis not present

## 2016-03-19 HISTORY — DX: Anxiety disorder, unspecified: F41.9

## 2016-03-19 HISTORY — PX: COLONOSCOPY WITH PROPOFOL: SHX5780

## 2016-03-19 HISTORY — DX: Dyspnea, unspecified: R06.00

## 2016-03-19 LAB — POCT I-STAT 4, (NA,K, GLUC, HGB,HCT)
Glucose, Bld: 88 mg/dL (ref 65–99)
HEMATOCRIT: 35 % — AB (ref 39.0–52.0)
HEMOGLOBIN: 11.9 g/dL — AB (ref 13.0–17.0)
Potassium: 3.2 mmol/L — ABNORMAL LOW (ref 3.5–5.1)
SODIUM: 140 mmol/L (ref 135–145)

## 2016-03-19 SURGERY — COLONOSCOPY WITH PROPOFOL
Anesthesia: Monitor Anesthesia Care

## 2016-03-19 SURGERY — COLONOSCOPY
Anesthesia: Monitor Anesthesia Care

## 2016-03-19 MED ORDER — LIDOCAINE HCL (CARDIAC) 20 MG/ML IV SOLN
INTRAVENOUS | Status: DC | PRN
  Administered 2016-03-19 (×3): 30 mg via INTRAVENOUS
  Administered 2016-03-19 (×2): 50 mg via INTRAVENOUS
  Administered 2016-03-19: 40 mg via INTRAVENOUS
  Administered 2016-03-19: 30 mg via INTRAVENOUS

## 2016-03-19 MED ORDER — PROPOFOL 10 MG/ML IV BOLUS
INTRAVENOUS | Status: DC | PRN
  Administered 2016-03-19: 50 mg via INTRAVENOUS

## 2016-03-19 MED ORDER — SODIUM CHLORIDE 0.9 % IV SOLN: INTRAVENOUS | Status: DC

## 2016-03-19 MED ORDER — LACTATED RINGERS IV SOLN
INTRAVENOUS | Status: DC
  Administered 2016-03-19: 1000 mL via INTRAVENOUS
  Administered 2016-03-19: 10:00:00 via INTRAVENOUS

## 2016-03-19 NOTE — Telephone Encounter (Signed)
New message      Wife states pt had a colonoscopy today.  He has been off all of his medications since last Sunday.  His GI doctor want pt to see Dr Gwenlyn Found next week.  I offered an appt on 2-28 but wife states he needs to see Dr Gwenlyn Found only next week.  Wife asked for the nurse to call her

## 2016-03-19 NOTE — Op Note (Signed)
[facility] Patient Name: Douglas Monroe Procedure Date : 03/19/2016 MRN: [name] Attending MD: Mauri Pole , MD Date of Birth: 08-Jul-1932 CSN: [id number] Age: 83 Admit Type: Inpatient Procedure:                Colonoscopy Indications:              Evaluation of unexplained GI bleeding Providers:                Mauri Pole, MD, Vista Lawman, RN, Cherylynn Ridges, Technician, Neldon Newport CRNA, CRNA Referring MD:              Medicines:                Monitored Anesthesia Care Complications:            No immediate complications. Estimated Blood Loss:     Estimated blood loss was minimal. Procedure:                Pre-Anesthesia Assessment:                           - Prior to the procedure, a History and Physical                            was performed, and patient medications and                            allergies were reviewed. The patient's tolerance of                            previous anesthesia was also reviewed. The risks                            and benefits of the procedure and the sedation                            options and risks were discussed with the patient.                            All questions were answered, and informed consent                            was obtained. Prior Anticoagulants: The patient                            last took Plavix (clopidogrel) 5 days prior to the                            procedure. ASA Grade Assessment: III - A patient                            with severe systemic disease. After reviewing the  risks and benefits, the patient was deemed in                            satisfactory condition to undergo the procedure.                           After obtaining informed consent, the colonoscope                            was passed under direct vision. Throughout the                            procedure, the patient's blood pressure, pulse,  and                            oxygen saturations were monitored continuously. The                            EC-[id number] scope was introduced through                            the anus and advanced to the the cecum, identified                            by appendiceal orifice and ileocecal valve. The                            colonoscopy was performed without difficulty. The                            patient tolerated the procedure well. The quality                            of the bowel preparation was excellent. The                            ileocecal valve, appendiceal orifice, and rectum                            were photographed. Scope In: 10:08:56 AM Scope Out: 10:28:32 AM Scope Withdrawal Time: 0 hours 14 minutes 42 seconds  Total Procedure Duration: 0 hours 19 minutes 36 seconds  Findings:      The perianal and digital rectal examinations were normal.      A 4 mm polyp was found in the cecum. The polyp was sessile. The polyp       was removed with a cold snare. Resection and retrieval were complete.      A 14 mm polyp was found in the transverse colon. The polyp was sessile.       The polyp was removed with a hot snare. Resection and retrieval were       complete.      A few fifteen mm ulcers were found in the descending colon and at the       splenic flexure. Oozing was present. Stigmata of recent bleeding were  present. Biopsies were taken with a cold forceps for histology.      The exam was otherwise without abnormality. Impression:               - One 4 mm polyp in the cecum, removed with a cold                            snare. Resected and retrieved.                           - One 14 mm polyp in the transverse colon, removed                            with a hot snare. Resected and retrieved.                           - A few ulcers in the descending colon and at the                            splenic flexure. Biopsied. Findings concerning for                             ischemic colitis                           - The examination was otherwise normal. Rest of                            colonic mucosa was normal. Moderate Sedation:      N/A Recommendation:           - Patient has a contact number available for                            emergencies. The signs and symptoms of potential                            delayed complications were discussed with the                            patient. Return to normal activities tomorrow.                            Written discharge instructions were provided to the                            patient.                           - Resume previous diet.                           - Maintain adequate hydration                           - Continue present medications except  antihypertensives (blood pressure is normal off                            medication likely secondary to GI blood loss)                           - Follow up with PMD next week                           - No ibuprofen, naproxen, or other non-steroidal                            anti-inflammatory drugs.                           - Resume Plavix (clopidogrel) at prior dose in 1                            week. Refer to managing physician for further                            adjustment of therapy.                           - No recommendation at this time regarding repeat                            colonoscopy due to age.                           - Return to [place] office in 1 week, to be                            scheduled Procedure Code(s):        --- Professional ---                           [phone], Colonoscopy, flexible; with removal of                            tumor(s), polyp(s), or other lesion(s) by snare                            technique                           45380, 31, Colonoscopy, flexible; with biopsy,                            single or multiple Diagnosis Code(s):         --- Professional ---                           D12.0, Benign neoplasm of cecum  D12.3, Benign neoplasm of transverse colon (hepatic                            flexure or splenic flexure)                           K63.3, Ulcer of intestine                           K92.2, Gastrointestinal hemorrhage, unspecified CPT copyright 2016 American Medical Association. All rights reserved. The codes documented in this report are preliminary and upon coder review may  be revised to meet current compliance requirements. Mauri Pole, MD 03/19/2016 10:57:34 AM This report has been signed electronically. Number of Addenda: 0

## 2016-03-19 NOTE — Anesthesia Postprocedure Evaluation (Addendum)
Anesthesia Post Note  Patient: Douglas Monroe  Procedure(s) Performed: Procedure(s) (LRB): COLONOSCOPY WITH PROPOFOL (N/A)  Patient location during evaluation: PACU Anesthesia Type: MAC Level of consciousness: awake and alert Pain management: pain level controlled Vital Signs Assessment: post-procedure vital signs reviewed and stable Respiratory status: spontaneous breathing, nonlabored ventilation, respiratory function stable and patient connected to nasal cannula oxygen Cardiovascular status: stable and blood pressure returned to baseline Anesthetic complications: no        Last Vitals:  Vitals:   03/19/16 1100 03/19/16 1110  BP: (!) 167/76 (!) 163/86  Pulse: 72 81  Resp: 20 15  Temp:      Last Pain:  Vitals:   03/19/16 1037  TempSrc: Oral   Pain Goal:                 Chelcey Caputo S

## 2016-03-19 NOTE — Discharge Instructions (Signed)
Please HOLD taking Plavix and blood pressure medications Amlodipine, Bystolic and Valsartan for next 1 week  YOU HAD AN ENDOSCOPIC PROCEDURE TODAY: Refer to the procedure report and other information in the discharge instructions given to you for any specific questions about what was found during the examination. If this information does not answer your questions, please call [place] office at [phone] to clarify.   YOU SHOULD EXPECT: Some feelings of bloating in the abdomen. Passage of more gas than usual. Walking can help get rid of the air that was put into your GI tract during the procedure and reduce the bloating. If you had a lower endoscopy (such as a colonoscopy or flexible sigmoidoscopy) you may notice spotting of blood in your stool or on the toilet paper. Some abdominal soreness may be present for a day or two, also.  DIET: Your first meal following the procedure should be a light meal and then it is ok to progress to your normal diet. A half-sandwich or bowl of soup is an example of a good first meal. Heavy or fried foods are harder to digest and may make you feel nauseous or bloated. Drink plenty of fluids but you should avoid alcoholic beverages for 24 hours. If you had a esophageal dilation, please see attached instructions for diet.    ACTIVITY: Your care partner should take you home directly after the procedure. You should plan to take it easy, moving slowly for the rest of the day. You can resume normal activity the day after the procedure however YOU SHOULD NOT DRIVE, use power tools, machinery or perform tasks that involve climbing or major physical exertion for 24 hours (because of the sedation medicines used during the test).   SYMPTOMS TO REPORT IMMEDIATELY: A gastroenterologist can be reached at any hour. Please call [phone]  for any of the following symptoms:  Following lower endoscopy (colonoscopy, flexible sigmoidoscopy) Excessive amounts of blood in the stool    Significant tenderness, worsening of abdominal pains  Swelling of the abdomen that is new, acute  Fever of 100 or higher  FOLLOW UP:  If any biopsies were taken you will be contacted by phone or by letter within the next 1-3 weeks. Call [phone]  if you have not heard about the biopsies in 3 weeks.  Please also call with any specific questions about appointments or follow up tests.

## 2016-03-19 NOTE — H&P (Signed)
[facility] History and Physical   Primary Care Physician:  [name], MD   Reason for Procedure:   Hematochezia, family h/o colon cancer Plan:    Colonoscopy with possible intervention     HPI: Douglas Monroe is a 83 y.o. male here for colonoscopy for evaluation of hematochezia. He has family history colon cancer in 1st degree relative.    Past Medical History:  Diagnosis Date  . Abdominal aortic aneurysm ([place])    infrarenal -- monitored by dr berry (note states stable 4.0 to 4.1cm)  . Anxiety    situational  . Arthritis    "hands" (03/15/2016)  . Cancer of skin of temple 2017   right  . COPD with emphysema ([place])   . Dyspnea    with exertion  . Exertional dyspnea   . First degree heart block   . GERD (gastroesophageal reflux disease)   . Headache(784.0)    uses goody powder; "maybe weekly, maybe not that much" (03/15/2016)  . History of kidney stones   . HOH (hard of hearing)    left ear; no hearing aid   . HTN (hypertension) 08/20/2011  . Hyperlipidemia   . Hypertension   . Left carotid artery stenosis    moderate  . Migraine    "long time ago" (03/15/2016)  . 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 PVD (peripheral vascular disease) with claudication [facility]) cardiologist-  dr berry   s/p bilateral iliac stents and right sfa stenting/  doppper study 09-05-2012  revealed ABIs close to one bilaterally with patent stents/  stenting left renal artery stenosis   . Right ureteral stone   . S/P arterial stent    left renal angioplasty and stent 11-02-2012  . Sleep apnea    at risk for OSA. stop/bang score= 6; sent to PCP 10/04/13; denies use of mask on 03/15/2016    Past Surgical History:  Procedure Laterality Date  . ABDOMINAL ANGIOGRAM  08/19/2011   Left common iliac artery at the ostium, 6x28mm ICAST covered stent, common femoral artery, 8x164mm Abbott absolute pro nitinol self-expanding stent and resulting in reduction of 80  and 90% stenosis to 0% residual  . ABDOMINAL ANGIOGRAM  10/08/2010   Mid-right SFA, 6x120 EV3 Protege nitinol self-expanding stent, resulting in reduction of CTO to 0% residual  . ABDOMINAL ANGIOGRAM  06/22/2010   Rt Common Femoral Artery-8x3 Absolute Pro Nitinol self-expanding stent-90% stenosis to 0% residual; Rt External Iliac Artery-9x3 Absolute Pro Nitinol self-expanding stent-70% stenosis to 0% residual; Left External Iliac Artery-8x4 Absolute Pro Nitinol self-expanding stent-90% to 0% residual  . ATHERECTOMY N/A 08/19/2011   Procedure: ATHERECTOMY;  Surgeon: Lorretta Harp, MD;  Location[facility] CATH LAB;  Service: Cardiovascular;  Laterality: N/A;  . BACK SURGERY    . CARDIAC CATHETERIZATION  10/26/2002  dr Tami Ribas   normal coronary arteries/ normal  LVF  . CARDIOVASCULAR STRESS TEST  06-12-2010   dr berry   normal perfusion study/ no ischemia/  ef 69%  . CYSTOSCOPY WITH RETROGRADE PYELOGRAM, URETEROSCOPY AND STENT PLACEMENT Right 10/10/2013   Procedure: uretheral dilation, CYSTOSCOPY, URETEROSCOPY, stone extraction, STENT PLACEMENT;  Surgeon: [name], MD;  Location: [facility];  Service: Urology;  Laterality: Right;  . HOLMIUM LASER APPLICATION Right [id number]   Procedure: HOLMIUM LASER APPLICATION;  Surgeon: [name], MD;  Location: [facility];  Service: Urology;  Laterality: Right;  . LUMBAR DISC SURGERY  1970's  . RENAL ANGIOGRAM  N/A 11/02/2012   Procedure: RENAL ANGIOGRAM;  Surgeon: Lorretta Harp, MD;  Location: [facility] CATH LAB;  Service: Cardiovascular;  Laterality: N/A;  . RENAL ARTERY STENT Left 11/02/2012  . SKIN CANCER EXCISION Right 2017   temple  . SOFT TISSUE CYST EXCISION  1960's   "outside part of lung LLL"    Prior to Admission medications   Medication Sig Start Date End Date Taking? Authorizing Provider  albuterol (PROVENTIL HFA;VENTOLIN HFA) 108 (90 BASE) MCG/ACT inhaler Inhale 2 puffs into the lungs every 6 (six) hours as needed  for shortness of breath.    Yes Historical Provider, MD  amLODipine (NORVASC) 5 MG tablet Take 5 mg by mouth every evening.   Yes Historical Provider, MD  atorvastatin (LIPITOR) 20 MG tablet TAKE 1 TABLET BY MOUTH  DAILY AT 6 PM. 01/14/16  Yes Lorretta Harp, MD  BYSTOLIC 10 MG tablet Take 10 mg by mouth daily. 02/23/16  Yes Historical Provider, MD  ciprofloxacin (CIPRO) 500 MG tablet Take 1 tablet (500 mg total) by mouth 2 (two) times daily. 03/17/16 03/24/16 Yes Reyne Dumas, MD  clopidogrel (PLAVIX) 75 MG tablet Take 75 mg by mouth daily.   Yes Historical Provider, MD  [employer] D3 2000 units CAPS Take 1 capsule by mouth daily. 01/31/16  Yes Historical Provider, MD  finasteride (PROSCAR) 5 MG tablet Take 5 mg by mouth every morning.   Yes Historical Provider, MD  metroNIDAZOLE (FLAGYL) 500 MG tablet Take 1 tablet (500 mg total) by mouth 3 (three) times daily. 03/17/16 03/22/16 Yes Reyne Dumas, MD  pantoprazole (PROTONIX) 40 MG tablet Take 40 mg by mouth daily. 01/21/16  Yes Historical Provider, MD  predniSONE (DELTASONE) 50 MG tablet Take 1 tablet (50 mg total) by mouth daily with breakfast. 03/17/16 03/22/16 Yes Reyne Dumas, MD  tamsulosin (FLOMAX) 0.4 MG CAPS capsule Take 1 capsule by mouth daily. 07/13/13  Yes Historical Provider, MD  tiotropium (SPIRIVA) 18 MCG inhalation capsule Place 18 mcg into inhaler and inhale daily.   Yes Historical Provider, MD  valsartan (DIOVAN) 160 MG tablet Take 1 tablet (160 mg total) by mouth daily. 03/19/16  Yes Reyne Dumas, MD    Current Facility-Administered Medications  Medication Dose Route Frequency Provider Last Rate Last Dose  . 0.9 %  sodium chloride infusion   Intravenous Continuous Vena Rua, PA-C      . lactated ringers infusion   Intravenous Continuous Mauri Pole, MD 125 mL/hr at 03/19/16 0907 1,000 mL at 03/19/16 0907    Allergies as of 03/18/2016 - Review Complete 03/16/2016  Allergen Reaction Noted  . Pneumococcal vaccines Swelling  10/04/2013    Family History  Problem Relation Age of Onset  . Hypertension Mother   . Cancer Father   . Diabetes Brother   . Hypertension Son     Social History   Social History  . Marital status: Married    Spouse name: N/A  . Number of children: N/A  . Years of education: N/A   Occupational History  . Not on file.   Social History Main Topics  . Smoking status: Former Smoker    Packs/day: 1.00    Years: 60.00    Types: Cigarettes    Quit date: 06/02/2010  . Smokeless tobacco: Never Used  . Alcohol use No  . Drug use: No  . Sexual activity: Not Currently   Other Topics Concern  . Not on file   Social History Narrative  . No narrative on  file    Review of Systems:  All other review of systems negative except as mentioned in the HPI.  Physical Exam: Vital signs in last 24 hours: Temp:  [98 F (36.7 C)] 98 F (36.7 C) (02/16 0850) Pulse Rate:  [83] 83 (02/16 0850) Resp:  [17] 17 (02/16 0850) BP: (118)/(83) 118/83 (02/16 0850) SpO2:  [94 %] 94 % (02/16 0850) Weight:  [150 lb (68 kg)] 150 lb (68 kg) (02/16 0850)   General:   Alert,  Well-developed, well-nourished, pleasant and cooperative in NAD Lungs:  Clear throughout to auscultation.   Heart:  Regular rate and rhythm; no murmurs, clicks, rubs,  or gallops. Abdomen:  Soft, nontender and nondistended. Normal bowel sounds.   Neuro/Psych:  Alert and cooperative. Normal mood and affect. A and O x 3   @K .Denzil Magnuson, MD [phone] Mon-Fri 8a-5p [phone] after 5p, weekends, holidays 03/19/2016 9:20 AM@

## 2016-03-19 NOTE — Anesthesia Procedure Notes (Signed)
Procedure Name: MAC Date/Time: 03/19/2016 9:58 AM Performed by: Neldon Newport Pre-anesthesia Checklist: Timeout performed, Patient being monitored, Suction available, Emergency Drugs available and Patient identified Patient Re-evaluated:Patient Re-evaluated prior to inductionOxygen Delivery Method: Simple face mask Placement Confirmation: positive ETCO2

## 2016-03-19 NOTE — Transfer of Care (Signed)
Immediate Anesthesia Transfer of Care Note  Patient: Douglas Monroe  Procedure(s) Performed: Procedure(s) with comments: COLONOSCOPY WITH PROPOFOL (N/A) - was inpatient, but got d/c'd home and is coming back as an OP  Patient Location: Endoscopy Unit  Anesthesia Type:MAC  Level of Consciousness: awake, alert  and oriented  Airway & Oxygen Therapy: Patient Spontanous Breathing and Patient connected to face mask oxygen  Post-op Assessment: Report given to RN, Post -op Vital signs reviewed and stable and Patient moving all extremities X 4  Post vital signs: Reviewed and stable  Last Vitals:  Vitals:   03/19/16 0850  BP: 118/83  Pulse: 83  Resp: 17  Temp: 36.7 C    Last Pain:  Vitals:   03/19/16 0850  TempSrc: Oral         Complications: No apparent anesthesia complications

## 2016-03-19 NOTE — Telephone Encounter (Signed)
Spoke with pt wife, the patient had to go to the ER and ended up having a colonoscopy for bleeding from the rectum. They currently have the patient off his bp medications and off his plavix. They were told by the GI doctor that he needed to be seen this week. She reports his bp medications were stopped in the hospital due to low bp. They are able to check his bp at home and it is 112. It is a wrist cuff so she does not feel that it is always accurate. They will see Kerin Ransom pa Friday next week when dr berry is also in the office. They will track his bp at home and call if elevated prior to appointment.

## 2016-03-19 NOTE — Anesthesia Preprocedure Evaluation (Addendum)
Anesthesia Evaluation  Patient identified by MRN, date of birth, ID band Patient awake    Reviewed: Allergy & Precautions, H&P , NPO status , Patient's Chart, lab work & pertinent test results  Airway Mallampati: II   Neck ROM: full    Dental  (+) Edentulous Lower, Edentulous Upper, Dental Advidsory Given   Pulmonary shortness of breath, sleep apnea , COPD, former smoker,    breath sounds clear to auscultation       Cardiovascular hypertension, On Medications + Peripheral Vascular Disease  + dysrhythmias  Rhythm:regular Rate:Normal     Neuro/Psych  Headaches, Anxiety    GI/Hepatic GERD  ,  Endo/Other    Renal/GU Renal disease     Musculoskeletal  (+) Arthritis ,   Abdominal   Peds  Hematology   Anesthesia Other Findings   Reproductive/Obstetrics                            Anesthesia Physical Anesthesia Plan  ASA: III  Anesthesia Plan: MAC   Post-op Pain Management:    Induction: Intravenous  Airway Management Planned: Simple Face Mask  Additional Equipment:   Intra-op Plan:   Post-operative Plan:   Informed Consent: I have reviewed the patients History and Physical, chart, labs and discussed the procedure including the risks, benefits and alternatives for the proposed anesthesia with the patient or authorized representative who has indicated his/her understanding and acceptance.   Dental Advisory Given  Plan Discussed with: CRNA and Anesthesiologist  Anesthesia Plan Comments:        Anesthesia Quick Evaluation

## 2016-03-21 ENCOUNTER — Encounter (HOSPITAL_COMMUNITY): Payer: Self-pay | Admitting: Gastroenterology

## 2016-03-22 ENCOUNTER — Telehealth: Payer: Self-pay | Admitting: Gastroenterology

## 2016-03-22 DIAGNOSIS — I129 Hypertensive chronic kidney disease with stage 1 through stage 4 chronic kidney disease, or unspecified chronic kidney disease: Secondary | ICD-10-CM | POA: Diagnosis not present

## 2016-03-22 DIAGNOSIS — K922 Gastrointestinal hemorrhage, unspecified: Secondary | ICD-10-CM | POA: Diagnosis not present

## 2016-03-22 DIAGNOSIS — N182 Chronic kidney disease, stage 2 (mild): Secondary | ICD-10-CM | POA: Diagnosis not present

## 2016-03-22 DIAGNOSIS — R0600 Dyspnea, unspecified: Secondary | ICD-10-CM | POA: Diagnosis not present

## 2016-03-22 NOTE — Telephone Encounter (Signed)
Appointment scheduled with Nicoletta Ba for 03/31/16.

## 2016-03-22 NOTE — Telephone Encounter (Signed)
-----   Message from Greggory Keen, LPN sent at [id number]  8:53 AM EST -----   ----- Message ----- From: Mauri Pole, MD Sent: 03/19/2016  12:44 PM To: Greggory Keen, LPN  Can you please schedule him for follow up office visit in 1-2 weeks. He has ulcerations in colon , likely ischemia. Biopsies pending from colonoscopy this morning. He will need repeat Hgb at office visit. Ok to schedule with extender if I have no openings. Thanks VN

## 2016-03-23 ENCOUNTER — Telehealth: Payer: Self-pay | Admitting: Cardiovascular Disease

## 2016-03-23 NOTE — Telephone Encounter (Signed)
Pt of Dr.Berry Hx PVD, arterial stenting from 2012 to 2014 (bilat iliac, R SFA)  Returned call. Patient had initiation of GI bleeding approx 2 weeks ago, and at MD instruction had stopped Plavix at that time. Saw GI, & had a colonoscopy Friday 2/16, instructions to resume plavix 1 week from that date.  Pt now c/o calf pain bilaterally this AM. States left leg is worse than right, right is no longer hurting him. Left leg only hurting with bearing of weight/standing -- reports better w/ rest. Pt concerned for possible reocclusion of peripheral arteries and wants Dr. Kennon Holter advice on whether safe to resume plavix at this time. Aware I'll route for advice, recommended for now to continue at previous instruction to resume med on Friday, as well as f/u w Lurena Joiner at that time as scheduled.

## 2016-03-23 NOTE — Telephone Encounter (Signed)
Douglas Monroe is calling because Douglas Monroe is stating that the back of his legs are hurting .( He has stents in his legs and one going to his kidney ) . He was taken off of his  Plavix to have a colonoscopy on Friday . He has not taken his Plavix since Sunday a week ago. She is wanting to know should he go back on his Plavix before his Friday 03/26/16 appt w/ Kerin Ransom . Please call   Thanks

## 2016-03-26 ENCOUNTER — Ambulatory Visit (INDEPENDENT_AMBULATORY_CARE_PROVIDER_SITE_OTHER): Payer: Medicare Other | Admitting: Cardiology

## 2016-03-26 ENCOUNTER — Encounter: Payer: Self-pay | Admitting: Cardiology

## 2016-03-26 VITALS — BP 124/69 | HR 74 | Ht 66.0 in | Wt 151.4 lb

## 2016-03-26 DIAGNOSIS — I714 Abdominal aortic aneurysm, without rupture, unspecified: Secondary | ICD-10-CM

## 2016-03-26 DIAGNOSIS — IMO0001 Reserved for inherently not codable concepts without codable children: Secondary | ICD-10-CM

## 2016-03-26 DIAGNOSIS — I739 Peripheral vascular disease, unspecified: Secondary | ICD-10-CM

## 2016-03-26 DIAGNOSIS — Z0389 Encounter for observation for other suspected diseases and conditions ruled out: Secondary | ICD-10-CM

## 2016-03-26 DIAGNOSIS — J441 Chronic obstructive pulmonary disease with (acute) exacerbation: Secondary | ICD-10-CM

## 2016-03-26 DIAGNOSIS — I10 Essential (primary) hypertension: Secondary | ICD-10-CM | POA: Diagnosis not present

## 2016-03-26 DIAGNOSIS — I701 Atherosclerosis of renal artery: Secondary | ICD-10-CM | POA: Diagnosis not present

## 2016-03-26 NOTE — Assessment & Plan Note (Addendum)
Hospitalized 2/12-2/14/18 with COPD exacerbation

## 2016-03-26 NOTE — Patient Instructions (Signed)
Medication Instructions:  RESUME PLAVIX   If you need a refill on your cardiac medications before your next appointment, please call your pharmacy.  Testing/Procedures: Your physician has requested that you have a lower extremity arterial duplex. This test is an ultrasound of the arteries in the legs or arms. It looks at arterial blood flow in the legs and arms. Allow one hour for Lower and Upper Arterial scans. There are no restrictions or special instructions  Your physician has requested that you have an abdominal aorta duplex. During this test, an ultrasound is used to evaluate the aorta. Allow 30 minutes for this exam. Do not eat after midnight the day before and avoid carbonated beverages  Follow-Up: Your physician recommends that you schedule a follow-up appointment in: WITH DR Gwenlyn Found AFTER SCANS   Special Instructions: PLEASE SCHEDULE ON THE SAME DAY    Thank you for choosing [facility] at [facility], LPN

## 2016-03-26 NOTE — Telephone Encounter (Signed)
Resume Plavix, check arterial Doppler studies, return office visit with the level provider.

## 2016-03-26 NOTE — Assessment & Plan Note (Signed)
Controlled, Diovan stopped during recent admssion

## 2016-03-26 NOTE — Assessment & Plan Note (Signed)
Renal artery stenosis-s/p Lt RA stent in  2014, RA doppler- 60-99% on Lt, 0-59% on Rt -May 2017

## 2016-03-26 NOTE — Telephone Encounter (Signed)
Patient is seeing Kerin Ransom pa today, will route this note to luke.

## 2016-03-26 NOTE — Assessment & Plan Note (Signed)
Pt was recently taken off Plavix secondary to lower GI bleeding and since has noticed increased LE claudication, Lt> Rt

## 2016-03-26 NOTE — Progress Notes (Signed)
03/26/2016 Douglas Monroe   [birth year]  [name]  Primary Physician [name], MD Primary Cardiologist: Dr Gwenlyn Found  HPI:  83 y/o male, former Chief Operating Officer, followed by Dr Gwenlyn Found with a history of PVD-s/p multiple PCIs of his LE, s/p Lt RA stent in 2014 that appears to subsequently be occluded, and an AAA that we follow (last [place] July 2017-3.3 x 3.7). The pt has severe COPD, he smoke non filters for more than 60 yrs. He has quit. He was admitted 2/12-2/14 with COPD exacerbation. When EMS picked him up he was on the commode and it was noted there was bright red blood in the commode. At the hospital his Hgb was stable12-13. He was referred for GI evaluation which was recently done- he had a polypectomy and a small colonic ulcer. His Plavix was stopped 2/12 and his GI physician said he could resume it today. His Valsartan was also stopped at the hospital, I believe secondary to low B/P.   Since discharge he has noted some increase in hi LE claudication, Lt > Rt. He is in the office today for further follow up.     Current Outpatient Prescriptions  Medication Sig Dispense Refill  . albuterol (PROVENTIL HFA;VENTOLIN HFA) 108 (90 BASE) MCG/ACT inhaler Inhale 2 puffs into the lungs every 6 (six) hours as needed for shortness of breath.     [name] amLODipine (NORVASC) 5 MG tablet Take 5 mg by mouth every evening.    [name] atorvastatin (LIPITOR) 20 MG tablet TAKE 1 TABLET BY MOUTH  DAILY AT 6 PM. 90 tablet 3  . BYSTOLIC 10 MG tablet Take 10 mg by mouth daily.    . clopidogrel (PLAVIX) 75 MG tablet Take 75 mg by mouth daily.    . [facility] D3 2000 units CAPS Take 1 capsule by mouth daily.  5  . finasteride (PROSCAR) 5 MG tablet Take 5 mg by mouth every morning.    . pantoprazole (PROTONIX) 40 MG tablet Take 40 mg by mouth daily.  5  . tamsulosin (FLOMAX) 0.4 MG CAPS capsule Take 1 capsule by mouth daily.    [name] tiotropium (SPIRIVA) 18 MCG inhalation capsule Place 18 mcg into inhaler and inhale daily.    .  valsartan (DIOVAN) 160 MG tablet Take 1 tablet (160 mg total) by mouth daily. 30 tablet 0   No current facility-administered medications for this visit.     Allergies  Allergen Reactions  . Pneumococcal Vaccines Swelling    Lip swelling    Social History   Social History  . Marital status: Married    Spouse name: N/A  . Number of children: N/A  . Years of education: N/A   Occupational History  . Not on file.   Social History Main Topics  . Smoking status: Former Smoker    Packs/day: 1.00    Years: 60.00    Types: Cigarettes    Quit date: 06/02/2010  . Smokeless tobacco: Never Used  . Alcohol use No  . Drug use: No  . Sexual activity: Not Currently   Other Topics Concern  . Not on file   Social History Narrative  . No narrative on file     Review of Systems: General: negative for chills, fever, night sweats or weight changes.  Cardiovascular: negative for chest pain, dyspnea on exertion, edema, orthopnea, palpitations, paroxysmal nocturnal dyspnea or shortness of breath Dermatological: negative for rash Respiratory: negative for cough or wheezing Urologic: negative for hematuria Abdominal: negative for  nausea, vomiting, diarrhea, bright red blood per rectum, melena, or hematemesis Neurologic: negative for visual changes, syncope, or dizziness All other systems reviewed and are otherwise negative except as noted above.    Blood pressure 124/69, pulse 74, height 5\' 6"  (1.676 m), weight 151 lb 6.4 oz (68.7 kg), SpO2 96 %.  General appearance: alert, cooperative, appears stated age and no distress Neck: no carotid bruit and no JVD Lungs: decreased breath sounds cw COPD Heart: regular rate and rhythm Extremities: no edema Skin: no edema Neurologic: Grossly normal   ASSESSMENT AND PLAN:   Claudication of both lower extremities (HCC) Pt was recently taken off Plavix secondary to lower GI bleeding and since has noticed increased LE claudication, Lt> Rt  PVD  (peripheral vascular disease) with claudication, previous stents to bilateral iliacs. Rt. SFA also.  previous stents to bilateral iliacs. Rt. SFA   Normal coronary arteries-  2004, negative Myoview 2012  Renal artery stenosis, progression of disease Renal artery stenosis-s/p Lt RA stent in  2014, RA doppler- 60-99% on Lt, 0-59% on Rt -May 2017  HTN (hypertension) Controlled, Diovan stopped during recent admssion  COPD exacerbation ([place]) Hospitalized 2/12-2/14/18 with COPD exacerbation   PLAN  Resume Plavix, hold off on Valsartan for now as his B/P is stable. Check LE dopplers and f/u with Dr Gwenlyn Found. The pt would also like his AAA checked as well and this will be arranged.   Kerin Ransom PA-C 03/26/2016 12:26 PM

## 2016-03-26 NOTE — Assessment & Plan Note (Signed)
previous stents to bilateral iliacs. Rt. SFA

## 2016-03-26 NOTE — Assessment & Plan Note (Deleted)
Renal artery stenosis 60-99% on Lt, 0-59% on Rt by doppler May 2017

## 2016-03-26 NOTE — Assessment & Plan Note (Signed)
2004, negative Myoview 2012

## 2016-03-29 ENCOUNTER — Encounter: Payer: Self-pay | Admitting: *Deleted

## 2016-03-31 ENCOUNTER — Other Ambulatory Visit (INDEPENDENT_AMBULATORY_CARE_PROVIDER_SITE_OTHER): Payer: Medicare Other

## 2016-03-31 ENCOUNTER — Ambulatory Visit (INDEPENDENT_AMBULATORY_CARE_PROVIDER_SITE_OTHER): Payer: Medicare Other | Admitting: Physician Assistant

## 2016-03-31 ENCOUNTER — Encounter: Payer: Self-pay | Admitting: Physician Assistant

## 2016-03-31 VITALS — BP 112/66 | HR 68 | Ht 66.0 in | Wt 151.4 lb

## 2016-03-31 DIAGNOSIS — D126 Benign neoplasm of colon, unspecified: Secondary | ICD-10-CM | POA: Diagnosis not present

## 2016-03-31 DIAGNOSIS — J441 Chronic obstructive pulmonary disease with (acute) exacerbation: Secondary | ICD-10-CM | POA: Diagnosis not present

## 2016-03-31 DIAGNOSIS — K559 Vascular disorder of intestine, unspecified: Secondary | ICD-10-CM | POA: Diagnosis not present

## 2016-03-31 LAB — CBC WITH DIFFERENTIAL/PLATELET
BASOS ABS: 0 10*3/uL (ref 0.0–0.1)
Basophils Relative: 0.7 % (ref 0.0–3.0)
EOS ABS: 0.1 10*3/uL (ref 0.0–0.7)
Eosinophils Relative: 2.1 % (ref 0.0–5.0)
HCT: 42.7 % (ref 39.0–52.0)
Hemoglobin: 14.4 g/dL (ref 13.0–17.0)
LYMPHS ABS: 1.1 10*3/uL (ref 0.7–4.0)
Lymphocytes Relative: 16.5 % (ref 12.0–46.0)
MCHC: 33.7 g/dL (ref 30.0–36.0)
MCV: 94 fl (ref 78.0–100.0)
MONO ABS: 0.7 10*3/uL (ref 0.1–1.0)
MONOS PCT: 10.5 % (ref 3.0–12.0)
NEUTROS ABS: 4.5 10*3/uL (ref 1.4–7.7)
NEUTROS PCT: 70.2 % (ref 43.0–77.0)
PLATELETS: 151 10*3/uL (ref 150.0–400.0)
RBC: 4.55 Mil/uL (ref 4.22–5.81)
RDW: 14.8 % (ref 11.5–15.5)
WBC: 6.5 10*3/uL (ref 4.0–10.5)

## 2016-03-31 NOTE — Progress Notes (Signed)
Subjective:    Patient ID: Douglas Monroe, male    DOB: [birth year], 83 y.o.   MRN: [name]  HPI Douglas Monroe is a pleasant 83 year old white male, recently known to Dr. Silverio Decamp is in today for post hospital follow-up. He has history of peripheral vascular disease, renal artery stenosis, hypertension, COPD, history of colon polyps and an abdominal aortic aneurysm. He was hospitalized on 03/17/2016 after he developed acute abdominal pain and rectal bleeding. He was also felt to have had a COPD exacerbation. He had CT of the abdomen and pelvis done in the emergency room which showed old wall thickening in the colon from the distal transverse colon through the proximal descending colon, also with 3.4 cm infrarenal abdominal aortic aneurysm with extensive mural plaque. GI was consulted and he underwent colonoscopy on 03/19/2016 per Dr. Silverio Decamp. He had 2 polyps largest was 14 mm in the transverse colon both were tubular adenomas. He was also noted to have a few ulcers in the descending colon and splenic flexure and biopsies were taken. These were consistent with changes of ischemic colitis. Last CBC done on 03/17/2016 hemoglobin 12.2 hematocrit of 36.9. Says he has not had any problems since discharge from the hospital, his breathing is about at his baseline. He has no complaints of abdominal pain, bowel habits have been normal and he has not noted any melena or hematochezia. He is maintained on Plavix and aspirin which had briefly been held and he has now restarted.  Review of Systems Pertinent positive and negative review of systems were noted in the above HPI section.  All other review of systems was otherwise negative.  Outpatient Encounter Prescriptions as of 03/31/2016  Medication Sig  . albuterol (PROVENTIL HFA;VENTOLIN HFA) 108 (90 BASE) MCG/ACT inhaler Inhale 2 puffs into the lungs every 6 (six) hours as needed for shortness of breath.   [name] amLODipine (NORVASC) 5 MG tablet Take 5 mg by mouth every  evening.  [name] atorvastatin (LIPITOR) 20 MG tablet TAKE 1 TABLET BY MOUTH  DAILY AT 6 PM.  . BYSTOLIC 10 MG tablet Take 10 mg by mouth daily.  . clopidogrel (PLAVIX) 75 MG tablet Take 75 mg by mouth daily.  . CVS D3 2000 units CAPS Take 1 capsule by mouth daily.  . finasteride (PROSCAR) 5 MG tablet Take 5 mg by mouth every morning.  . pantoprazole (PROTONIX) 40 MG tablet Take 40 mg by mouth daily.  . tamsulosin (FLOMAX) 0.4 MG CAPS capsule Take 1 capsule by mouth daily.  [name] tiotropium (SPIRIVA) 18 MCG inhalation capsule Place 18 mcg into inhaler and inhale daily.  . valsartan (DIOVAN) 160 MG tablet Take 1 tablet (160 mg total) by mouth daily.   No facility-administered encounter medications on file as of 03/31/2016.    Allergies  Allergen Reactions  . Pneumococcal Vaccines Swelling    Lip swelling   Patient Active Problem List   Diagnosis Date Noted  . Polyp of cecum   . Polyp of transverse colon   . BRBPR (bright red blood per rectum)   . Hematochezia   . Ischemic colitis (Douglas Monroe)   . Ulceration, colon   . Left sided abdominal pain   . Rectal bleeding 03/15/2016  . Acute hyperglycemia 03/15/2016  . COPD exacerbation ([place])   . Renal insufficiency   . PVD (peripheral vascular disease) ([place])   . Ureteral calculus 10/10/2013  . Hyperlipidemia 07/24/2013  . S/P arterial stent, to Lt renal art. 11/02/12 11/03/2012  . Carotid artery disease ([place])  10/06/2012  . Renal artery stenosis, progression of disease 10/06/2012  . Abdominal aortic aneurysm ([place]) 10/06/2012  . Normal coronary arteries-  07/21/2012  . PVD (peripheral vascular disease) with claudication, previous stents to bilateral iliacs. Rt. SFA also.  08/20/2011  . Claudication of both lower extremities ([place]) 08/20/2011  . PVD S/P multiple PCIs 08/20/2011  . COPD (chronic obstructive pulmonary disease) ([place]) 08/20/2011  . HTN (hypertension) 08/20/2011   Social History   Social History  . Marital status: Married    Spouse name:  N/A  . Number of children: N/A  . Years of education: N/A   Occupational History  . Not on file.   Social History Main Topics  . Smoking status: Former Smoker    Packs/day: 1.00    Years: 60.00    Types: Cigarettes    Quit date: 06/02/2010  . Smokeless tobacco: Never Used  . Alcohol use No  . Drug use: No  . Sexual activity: Not Currently   Other Topics Concern  . Not on file   Social History Narrative  . No narrative on file    Douglas Monroe family history includes Cancer in his father; Diabetes in his brother; Hypertension in his mother and son.      Objective:    Vitals:   03/31/16 1333  BP: 112/66  Pulse: 68    Physical Exam well-developed elderly white male in no acute distress, pleasant blood pressure 112/66 pulse 68, height 5 foot 6, weight 151 BMI 24.4. HEENT ; nontraumatic normocephalic EOMI PERRLA sclera anicteric, Cardiovascular; regular rate and rhythm with S1-S2, Pulmonary; decreased breath sounds bilaterally,, Abdomen; soft, bowel sounds are present he is nontender there is no palpable mass or hepatosplenomegaly, Rectal ;exam not done, Ext; no clubbing cyanosis or edema skin warm and dry, Neuropsych; mood and affect appropriate       Assessment & Plan:   #32 83 year old white male with recent hospitalization with COPD exacerbation and mild segmental ischemic colitis. His colitis symptoms have resolved and he is no current GI complaints #2 tubular adenomatous colon polyps-no follow-up colonoscopy planned due to age #3 peripheral vascular disease #4 renal artery stenosis #5 abdominal aortic aneurysm #6 COPD  Plan; we'll check CBC today Colonoscopy findings discussed in detail with patient today. He will follow-up with Dr. Silverio Decamp or myself on an as-needed basis.   Livio Ledwith S Jeb Schloemer PA-C 03/31/2016   Cc: Glendale Chard, MD

## 2016-03-31 NOTE — Patient Instructions (Signed)
If you are age 81 or older, your body mass index should be between 23-30. Your Body mass index is 24.44 kg/m. If this is out of the aforementioned range listed, please consider follow up with your Primary Care Provider.  If you are age 71 or younger, your body mass index should be between 19-25. Your Body mass index is 24.44 kg/m. If this is out of the aformentioned range listed, please consider follow up with your Primary Care Provider.   Your physician has requested that you go to the basement for the following lab work before leaving today:  CBC  Follow up with Dr. Silverio Decamp as needed.  Thank you for choosing me and [facility].  Nicoletta Ba, PAC

## 2016-03-31 NOTE — Progress Notes (Signed)
Reviewed and agree with documentation and assessment and plan. K. Veena Marquesa Rath , MD   

## 2016-04-08 ENCOUNTER — Other Ambulatory Visit: Payer: Self-pay | Admitting: Cardiovascular Disease

## 2016-04-08 ENCOUNTER — Other Ambulatory Visit: Payer: Self-pay | Admitting: Cardiology

## 2016-04-08 DIAGNOSIS — I739 Peripheral vascular disease, unspecified: Secondary | ICD-10-CM

## 2016-04-08 DIAGNOSIS — I714 Abdominal aortic aneurysm, without rupture, unspecified: Secondary | ICD-10-CM

## 2016-04-14 DIAGNOSIS — N182 Chronic kidney disease, stage 2 (mild): Secondary | ICD-10-CM | POA: Diagnosis not present

## 2016-04-14 DIAGNOSIS — E559 Vitamin D deficiency, unspecified: Secondary | ICD-10-CM | POA: Diagnosis not present

## 2016-04-14 DIAGNOSIS — R7309 Other abnormal glucose: Secondary | ICD-10-CM | POA: Diagnosis not present

## 2016-04-14 DIAGNOSIS — I129 Hypertensive chronic kidney disease with stage 1 through stage 4 chronic kidney disease, or unspecified chronic kidney disease: Secondary | ICD-10-CM | POA: Diagnosis not present

## 2016-04-14 DIAGNOSIS — J449 Chronic obstructive pulmonary disease, unspecified: Secondary | ICD-10-CM | POA: Diagnosis not present

## 2016-04-14 DIAGNOSIS — Z0000 Encounter for general adult medical examination without abnormal findings: Secondary | ICD-10-CM | POA: Diagnosis not present

## 2016-04-23 ENCOUNTER — Ambulatory Visit (HOSPITAL_COMMUNITY)
Admission: RE | Admit: 2016-04-23 | Discharge: 2016-04-23 | Disposition: A | Discharge status: Home / Self Care | Payer: Medicare Other | Source: Ambulatory Visit | Attending: Cardiology | Admitting: Cardiology

## 2016-04-23 DIAGNOSIS — I739 Peripheral vascular disease, unspecified: Secondary | ICD-10-CM | POA: Diagnosis not present

## 2016-04-23 DIAGNOSIS — I700 Atherosclerosis of aorta: Secondary | ICD-10-CM | POA: Insufficient documentation

## 2016-04-23 DIAGNOSIS — I714 Abdominal aortic aneurysm, without rupture, unspecified: Secondary | ICD-10-CM

## 2016-04-27 ENCOUNTER — Encounter: Payer: Self-pay | Admitting: Cardiovascular Disease

## 2016-04-27 ENCOUNTER — Ambulatory Visit (INDEPENDENT_AMBULATORY_CARE_PROVIDER_SITE_OTHER): Payer: Medicare Other | Admitting: Cardiovascular Disease

## 2016-04-27 ENCOUNTER — Other Ambulatory Visit: Payer: Self-pay | Admitting: Cardiovascular Disease

## 2016-04-27 VITALS — BP 142/69 | HR 72 | Ht 66.5 in | Wt 152.4 lb

## 2016-04-27 DIAGNOSIS — I779 Disorder of arteries and arterioles, unspecified: Secondary | ICD-10-CM

## 2016-04-27 DIAGNOSIS — E7800 Pure hypercholesterolemia, unspecified: Secondary | ICD-10-CM | POA: Diagnosis not present

## 2016-04-27 DIAGNOSIS — I714 Abdominal aortic aneurysm, without rupture, unspecified: Secondary | ICD-10-CM

## 2016-04-27 DIAGNOSIS — I739 Peripheral vascular disease, unspecified: Secondary | ICD-10-CM | POA: Diagnosis not present

## 2016-04-27 DIAGNOSIS — I701 Atherosclerosis of renal artery: Secondary | ICD-10-CM

## 2016-04-27 DIAGNOSIS — I10 Essential (primary) hypertension: Secondary | ICD-10-CM

## 2016-04-27 NOTE — Assessment & Plan Note (Signed)
History of hyperlipidemia on statin therapy followed by his PCP 

## 2016-04-27 NOTE — Assessment & Plan Note (Signed)
History of left renal artery stenosis status post stenting in 2014. Renal Doppler performed 06/18/15 did reveal elevated left renal aortic ratio. We will continue to follow this noninvasively.

## 2016-04-27 NOTE — Progress Notes (Signed)
04/27/2016 Douglas Monroe   1932-08-19  [id number]  Primary Physician [name], MD Primary Cardiologist: [name] [name], [name]  HPI:  The patient is a very pleasant 83 year old, thin-appearing, married Caucasian male, father of 2, grandfather to 5 grandchildren who I last saw in the office 07/22/15 He works part time Administrator and was referred initially to me for claudication. He stopped smoking over a year ago and has COPD, hypertension and hyperlipidemia. He had normal coronary arteries by cath performed by Dr. Tami Ribas in 2004 and normal LV function. I stented both of his iliacs in the past, as well as recanalized his right SFA. He did have an incidentally noted 80% left renal artery stenosis which we have been following by duplex ultrasound and has remained stable, as well as moderate left internal carotid artery stenosis. We follow this as well. He is neurologically asymptomatic. He did have recurrent claudication with a drop in his left ABI and reangiogram which revealed in-stent restenosis with progression of disease. I restented his left iliac ostium with an iCAST covered stent and then stented his entire left external iliac artery with an Absolute Pro Nitinol self-expanding stent (8 x 100 mm.) His symptoms resolved and his Dopplers normalized. He no longer has claudication. He denies chest pain or shortness of breath. His most recent lipid profile performed in August revealed total cholesterol 112, LDL 62 and HDL 35.  Since I saw him in the office 02/16/12 he has remained stable. He denies chest pain, shortness of breath or claudication. This will Dopplers performed 09/12/12 didn't show a progression of the left renal artery stenosis. Based on this we decided to proceed with percutaneous intervention for renal preservation. This was performed in 11/02/12 at which time I placed a 6 mm x 12 mm long balloon expandable stent in the ostium of his left renal artery  which was 80% on angiography reduced to 0% residual. At the time of angiography to document a small abdominal aortic aneurysm which we are following up with ultrasound as well as patent stents in his common and external iliac arteries bilaterally. Since I saw him back in the office 11/29/12 his remained stable. He denies chest pain, increasing shortness of breath or claudication. He does have COPD and stopped smoking back in 2012. His most recent renal Dopplers performed in April revealed a widely patent left renal artery stent. His abdominal aortic aneurysm measures 3.7 cm followed on an annual basis. His lower extremity Dopplers performed 04/23/16 revealed patent iliac stents as well as right SFA stent with normal ABIs. His carotid Dopplers performed 10/16/13 revealed mild to moderate left ICA stenosis. Since I saw him a year ago his remained stable. He does have some dyspnea on exertion probably from his COPD but denies chest pain or claudication.    Current Outpatient Prescriptions  Medication Sig Dispense Refill  . albuterol (PROVENTIL HFA;VENTOLIN HFA) 108 (90 BASE) MCG/ACT inhaler Inhale 2 puffs into the lungs every 6 (six) hours as needed for shortness of breath.     [name] amLODipine (NORVASC) 5 MG tablet Take 5 mg by mouth every evening.    [name] atorvastatin (LIPITOR) 20 MG tablet TAKE 1 TABLET BY MOUTH  DAILY AT 6 PM. 90 tablet 3  . BYSTOLIC 10 MG tablet Take 10 mg by mouth daily.    . clopidogrel (PLAVIX) 75 MG tablet Take 75 mg by mouth daily.    . CVS D3 2000 units CAPS  Take 1 capsule by mouth daily.  5  . finasteride (PROSCAR) 5 MG tablet Take 5 mg by mouth every morning.    . pantoprazole (PROTONIX) 40 MG tablet Take 40 mg by mouth daily.  5  . tamsulosin (FLOMAX) 0.4 MG CAPS capsule Take 1 capsule by mouth daily.    [name] tiotropium (SPIRIVA) 18 MCG inhalation capsule Place 18 mcg into inhaler and inhale daily.    . valsartan (DIOVAN) 160 MG tablet Take 1 tablet (160 mg total) by mouth daily. 30  tablet 0   No current facility-administered medications for this visit.     Allergies  Allergen Reactions  . Pneumococcal Vaccines Swelling    Lip swelling    Social History   Social History  . Marital status: Married    Spouse name: N/A  . Number of children: N/A  . Years of education: N/A   Occupational History  . Not on file.   Social History Main Topics  . Smoking status: Former Smoker    Packs/day: 1.00    Years: 60.00    Types: Cigarettes    Quit date: 06/02/2010  . Smokeless tobacco: Never Used  . Alcohol use No  . Drug use: No  . Sexual activity: Not Currently   Other Topics Concern  . Not on file   Social History Narrative  . No narrative on file     Review of Systems: General: negative for chills, fever, night sweats or weight changes.  Cardiovascular: negative for chest pain, dyspnea on exertion, edema, orthopnea, palpitations, paroxysmal nocturnal dyspnea or shortness of breath Dermatological: negative for rash Respiratory: negative for cough or wheezing Urologic: negative for hematuria Abdominal: negative for nausea, vomiting, diarrhea, bright red blood per rectum, melena, or hematemesis Neurologic: negative for visual changes, syncope, or dizziness All other systems reviewed and are otherwise negative except as noted above.    Blood pressure (!) 142/69, pulse 72, height 5' 6.5" (1.689 m), weight 152 lb 6.4 oz (69.1 kg), SpO2 91 %.  General appearance: alert and no distress Neck: no adenopathy, no carotid bruit, no JVD, supple, symmetrical, trachea midline and thyroid not enlarged, symmetric, no tenderness/mass/nodules Lungs: clear to auscultation bilaterally Heart: regular rate and rhythm, S1, S2 normal, no murmur, click, rub or gallop Extremities: extremities normal, atraumatic, no cyanosis or edema  EKG not performed today  ASSESSMENT AND PLAN:   PVD (peripheral vascular disease) with claudication, previous stents to bilateral iliacs. Rt.  SFA also.  History of peripheral arterial disease status post bilateral iliac stenting as well as right SFA intervention. I re-intervened on his left common and external iliac in the past. He currently denies claudication. His most recent lower extremity Dopplers performed 04/23/16 revealed patent iliac stents with essentially normal ABIs. We'll continue to follow this on an annual basis.  HTN (hypertension) History of hypertension with blood pressure measured 142/69. He is on amlodipine, Bystolic  and valsartan. Continue current meds at current dosing  Renal artery stenosis, progression of disease History of left renal artery stenosis status post stenting in 2014. Renal Doppler performed 06/18/15 did reveal elevated left renal aortic ratio. We will continue to follow this noninvasively.  Carotid artery disease History of carotid artery disease with recent Dopplers performed 10/16/13 revealing mild to moderate left ICA stenosis. We will recheck carotid Doppler studies.  Abdominal aortic aneurysm (HCC) History of abdominal aortic aneurysm with recent Doppler revealing this to be stable. This is followed on an annual basis.  Hyperlipidemia History of hyperlipidemia  on statin therapy followed by his PCP.      [name] FACP,FACC,FAHA, [facility] 04/27/2016 10:32 AM

## 2016-04-27 NOTE — Assessment & Plan Note (Signed)
History of hypertension with blood pressure measured 142/69. He is on amlodipine, Bystolic  and valsartan. Continue current meds at current dosing

## 2016-04-27 NOTE — Assessment & Plan Note (Signed)
History of peripheral arterial disease status post bilateral iliac stenting as well as right SFA intervention. I re-intervened on his left common and external iliac in the past. He currently denies claudication. His most recent lower extremity Dopplers performed 04/23/16 revealed patent iliac stents with essentially normal ABIs. We'll continue to follow this on an annual basis.

## 2016-04-27 NOTE — Assessment & Plan Note (Signed)
History of carotid artery disease with recent Dopplers performed 10/16/13 revealing mild to moderate left ICA stenosis. We will recheck carotid Doppler studies.

## 2016-04-27 NOTE — Assessment & Plan Note (Signed)
History of abdominal aortic aneurysm with recent Doppler revealing this to be stable. This is followed on an annual basis.

## 2016-04-27 NOTE — Patient Instructions (Addendum)
Medication Instructions: Your physician recommends that you continue on your current medications as directed. Please refer to the Current Medication list given to you today.   Testing/Procedures: Your physician has requested that you have a carotid duplex. This test is an ultrasound of the carotid arteries in your neck. It looks at blood flow through these arteries that supply the brain with blood. Allow one hour for this exam. There are no restrictions or special instructions. (NOW)  Your physician has requested that you have a renal artery duplex. During this test, an ultrasound is used to evaluate blood flow to the kidneys. Allow one hour for this exam. Do not eat after midnight the day before and avoid carbonated beverages. Take your medications as you usually do. (In MAY)  Your physician has requested that you have a lower extremity arterial duplex. During this test, ultrasound is used to evaluate arterial blood flow in the legs. Allow one hour for this exam. There are no restrictions or special instructions.  Your physician has requested that you have an ankle brachial index (ABI). During this test an ultrasound and blood pressure cuff are used to evaluate the arteries that supply the arms and legs with blood. Allow thirty minutes for this exam. There are no restrictions or special instructions.  Your physician has requested that you have an abdominal aorta duplex. During this test, an ultrasound is used to evaluate the aorta. Allow 30 minutes for this exam. Do not eat after midnight the day before and avoid carbonated beverages (IN 1 YEAR)  Follow-Up: Your physician wants you to follow-up in: 1 year with Dr. Gwenlyn Found. You will receive a reminder letter in the mail two months in advance. If you don't receive a letter, please call our office to schedule the follow-up appointment.  If you need a refill on your cardiac medications before your next appointment, please call your pharmacy.

## 2016-04-28 DIAGNOSIS — J441 Chronic obstructive pulmonary disease with (acute) exacerbation: Secondary | ICD-10-CM | POA: Diagnosis not present

## 2016-04-29 ENCOUNTER — Telehealth: Payer: Self-pay

## 2016-04-29 DIAGNOSIS — I714 Abdominal aortic aneurysm, without rupture, unspecified: Secondary | ICD-10-CM

## 2016-04-29 DIAGNOSIS — I739 Peripheral vascular disease, unspecified: Secondary | ICD-10-CM

## 2016-04-29 NOTE — Telephone Encounter (Signed)
Per Dr Gwenlyn Found - repeat AAA and LEAs in 12 months. See procedure notes.

## 2016-05-06 ENCOUNTER — Ambulatory Visit (HOSPITAL_COMMUNITY)
Admission: RE | Admit: 2016-05-06 | Discharge: 2016-05-06 | Disposition: A | Discharge status: Home / Self Care | Payer: Medicare Other | Source: Ambulatory Visit | Attending: Cardiovascular Disease | Admitting: Cardiovascular Disease

## 2016-05-06 DIAGNOSIS — Z7902 Long term (current) use of antithrombotics/antiplatelets: Secondary | ICD-10-CM | POA: Diagnosis not present

## 2016-05-06 DIAGNOSIS — H9190 Unspecified hearing loss, unspecified ear: Secondary | ICD-10-CM | POA: Diagnosis not present

## 2016-05-06 DIAGNOSIS — R310 Gross hematuria: Secondary | ICD-10-CM | POA: Diagnosis not present

## 2016-05-06 DIAGNOSIS — Z833 Family history of diabetes mellitus: Secondary | ICD-10-CM | POA: Diagnosis not present

## 2016-05-06 DIAGNOSIS — Z809 Family history of malignant neoplasm, unspecified: Secondary | ICD-10-CM | POA: Diagnosis not present

## 2016-05-06 DIAGNOSIS — Z9889 Other specified postprocedural states: Secondary | ICD-10-CM | POA: Diagnosis not present

## 2016-05-06 DIAGNOSIS — R109 Unspecified abdominal pain: Secondary | ICD-10-CM | POA: Diagnosis not present

## 2016-05-06 DIAGNOSIS — C4490 Unspecified malignant neoplasm of skin, unspecified: Secondary | ICD-10-CM | POA: Diagnosis not present

## 2016-05-06 DIAGNOSIS — I6523 Occlusion and stenosis of bilateral carotid arteries: Secondary | ICD-10-CM | POA: Diagnosis not present

## 2016-05-06 DIAGNOSIS — Z87891 Personal history of nicotine dependence: Secondary | ICD-10-CM

## 2016-05-06 DIAGNOSIS — N400 Enlarged prostate without lower urinary tract symptoms: Secondary | ICD-10-CM | POA: Diagnosis not present

## 2016-05-06 DIAGNOSIS — I714 Abdominal aortic aneurysm, without rupture: Secondary | ICD-10-CM | POA: Diagnosis not present

## 2016-05-06 DIAGNOSIS — Z8249 Family history of ischemic heart disease and other diseases of the circulatory system: Secondary | ICD-10-CM | POA: Diagnosis not present

## 2016-05-06 DIAGNOSIS — J449 Chronic obstructive pulmonary disease, unspecified: Secondary | ICD-10-CM

## 2016-05-06 DIAGNOSIS — N179 Acute kidney failure, unspecified: Secondary | ICD-10-CM | POA: Diagnosis not present

## 2016-05-06 DIAGNOSIS — E785 Hyperlipidemia, unspecified: Secondary | ICD-10-CM

## 2016-05-06 DIAGNOSIS — N132 Hydronephrosis with renal and ureteral calculous obstruction: Secondary | ICD-10-CM | POA: Diagnosis not present

## 2016-05-06 DIAGNOSIS — I6501 Occlusion and stenosis of right vertebral artery: Secondary | ICD-10-CM

## 2016-05-06 DIAGNOSIS — Z79899 Other long term (current) drug therapy: Secondary | ICD-10-CM | POA: Diagnosis not present

## 2016-05-06 DIAGNOSIS — G4733 Obstructive sleep apnea (adult) (pediatric): Secondary | ICD-10-CM | POA: Diagnosis not present

## 2016-05-06 DIAGNOSIS — I10 Essential (primary) hypertension: Secondary | ICD-10-CM

## 2016-05-06 DIAGNOSIS — Z87442 Personal history of urinary calculi: Secondary | ICD-10-CM | POA: Diagnosis not present

## 2016-05-06 DIAGNOSIS — K219 Gastro-esophageal reflux disease without esophagitis: Secondary | ICD-10-CM | POA: Diagnosis not present

## 2016-05-06 DIAGNOSIS — I779 Disorder of arteries and arterioles, unspecified: Secondary | ICD-10-CM

## 2016-05-06 DIAGNOSIS — Z887 Allergy status to serum and vaccine status: Secondary | ICD-10-CM | POA: Diagnosis not present

## 2016-05-06 DIAGNOSIS — I739 Peripheral vascular disease, unspecified: Secondary | ICD-10-CM | POA: Insufficient documentation

## 2016-05-06 DIAGNOSIS — N1330 Unspecified hydronephrosis: Secondary | ICD-10-CM | POA: Diagnosis not present

## 2016-05-06 DIAGNOSIS — J439 Emphysema, unspecified: Secondary | ICD-10-CM | POA: Diagnosis not present

## 2016-05-06 DIAGNOSIS — N182 Chronic kidney disease, stage 2 (mild): Secondary | ICD-10-CM | POA: Diagnosis not present

## 2016-05-06 DIAGNOSIS — I129 Hypertensive chronic kidney disease with stage 1 through stage 4 chronic kidney disease, or unspecified chronic kidney disease: Secondary | ICD-10-CM | POA: Diagnosis not present

## 2016-05-08 ENCOUNTER — Emergency Department (HOSPITAL_COMMUNITY): Payer: Medicare Other

## 2016-05-08 ENCOUNTER — Inpatient Hospital Stay (HOSPITAL_COMMUNITY)
Admission: EM | Admit: 2016-05-08 | Discharge: 2016-05-10 | DRG: 694 | Disposition: A | Discharge status: Home Health | Payer: Medicare Other | Attending: Internal Medicine | Admitting: Internal Medicine

## 2016-05-08 ENCOUNTER — Encounter (HOSPITAL_COMMUNITY): Payer: Self-pay | Admitting: *Deleted

## 2016-05-08 DIAGNOSIS — Z809 Family history of malignant neoplasm, unspecified: Secondary | ICD-10-CM | POA: Diagnosis not present

## 2016-05-08 DIAGNOSIS — N132 Hydronephrosis with renal and ureteral calculous obstruction: Principal | ICD-10-CM | POA: Diagnosis present

## 2016-05-08 DIAGNOSIS — I10 Essential (primary) hypertension: Secondary | ICD-10-CM | POA: Diagnosis not present

## 2016-05-08 DIAGNOSIS — I714 Abdominal aortic aneurysm, without rupture, unspecified: Secondary | ICD-10-CM | POA: Diagnosis present

## 2016-05-08 DIAGNOSIS — E785 Hyperlipidemia, unspecified: Secondary | ICD-10-CM | POA: Diagnosis present

## 2016-05-08 DIAGNOSIS — Z833 Family history of diabetes mellitus: Secondary | ICD-10-CM

## 2016-05-08 DIAGNOSIS — C662 Malignant neoplasm of left ureter: Secondary | ICD-10-CM | POA: Diagnosis not present

## 2016-05-08 DIAGNOSIS — Z87442 Personal history of urinary calculi: Secondary | ICD-10-CM

## 2016-05-08 DIAGNOSIS — J439 Emphysema, unspecified: Secondary | ICD-10-CM | POA: Diagnosis present

## 2016-05-08 DIAGNOSIS — R109 Unspecified abdominal pain: Secondary | ICD-10-CM | POA: Diagnosis not present

## 2016-05-08 DIAGNOSIS — Z7902 Long term (current) use of antithrombotics/antiplatelets: Secondary | ICD-10-CM | POA: Diagnosis not present

## 2016-05-08 DIAGNOSIS — G4733 Obstructive sleep apnea (adult) (pediatric): Secondary | ICD-10-CM | POA: Diagnosis present

## 2016-05-08 DIAGNOSIS — H9190 Unspecified hearing loss, unspecified ear: Secondary | ICD-10-CM | POA: Diagnosis present

## 2016-05-08 DIAGNOSIS — I129 Hypertensive chronic kidney disease with stage 1 through stage 4 chronic kidney disease, or unspecified chronic kidney disease: Secondary | ICD-10-CM | POA: Diagnosis present

## 2016-05-08 DIAGNOSIS — Z9889 Other specified postprocedural states: Secondary | ICD-10-CM

## 2016-05-08 DIAGNOSIS — Z95820 Peripheral vascular angioplasty status with implants and grafts: Secondary | ICD-10-CM

## 2016-05-08 DIAGNOSIS — Z8249 Family history of ischemic heart disease and other diseases of the circulatory system: Secondary | ICD-10-CM | POA: Diagnosis not present

## 2016-05-08 DIAGNOSIS — N1330 Unspecified hydronephrosis: Secondary | ICD-10-CM | POA: Diagnosis not present

## 2016-05-08 DIAGNOSIS — Z466 Encounter for fitting and adjustment of urinary device: Secondary | ICD-10-CM | POA: Diagnosis not present

## 2016-05-08 DIAGNOSIS — Z887 Allergy status to serum and vaccine status: Secondary | ICD-10-CM | POA: Diagnosis not present

## 2016-05-08 DIAGNOSIS — I739 Peripheral vascular disease, unspecified: Secondary | ICD-10-CM | POA: Diagnosis present

## 2016-05-08 DIAGNOSIS — N182 Chronic kidney disease, stage 2 (mild): Secondary | ICD-10-CM | POA: Diagnosis not present

## 2016-05-08 DIAGNOSIS — N400 Enlarged prostate without lower urinary tract symptoms: Secondary | ICD-10-CM | POA: Diagnosis present

## 2016-05-08 DIAGNOSIS — C4490 Unspecified malignant neoplasm of skin, unspecified: Secondary | ICD-10-CM | POA: Diagnosis present

## 2016-05-08 DIAGNOSIS — J449 Chronic obstructive pulmonary disease, unspecified: Secondary | ICD-10-CM | POA: Diagnosis present

## 2016-05-08 DIAGNOSIS — J430 Unilateral pulmonary emphysema [MacLeod's syndrome]: Secondary | ICD-10-CM | POA: Diagnosis not present

## 2016-05-08 DIAGNOSIS — K219 Gastro-esophageal reflux disease without esophagitis: Secondary | ICD-10-CM | POA: Diagnosis not present

## 2016-05-08 DIAGNOSIS — Z79899 Other long term (current) drug therapy: Secondary | ICD-10-CM

## 2016-05-08 DIAGNOSIS — R1032 Left lower quadrant pain: Secondary | ICD-10-CM | POA: Diagnosis not present

## 2016-05-08 DIAGNOSIS — N2889 Other specified disorders of kidney and ureter: Secondary | ICD-10-CM | POA: Diagnosis not present

## 2016-05-08 DIAGNOSIS — N130 Hydronephrosis with ureteropelvic junction obstruction: Secondary | ICD-10-CM | POA: Diagnosis not present

## 2016-05-08 DIAGNOSIS — Z87891 Personal history of nicotine dependence: Secondary | ICD-10-CM | POA: Diagnosis not present

## 2016-05-08 DIAGNOSIS — R310 Gross hematuria: Secondary | ICD-10-CM | POA: Diagnosis not present

## 2016-05-08 DIAGNOSIS — E7800 Pure hypercholesterolemia, unspecified: Secondary | ICD-10-CM | POA: Diagnosis not present

## 2016-05-08 DIAGNOSIS — Z419 Encounter for procedure for purposes other than remedying health state, unspecified: Secondary | ICD-10-CM

## 2016-05-08 DIAGNOSIS — N179 Acute kidney failure, unspecified: Secondary | ICD-10-CM | POA: Diagnosis not present

## 2016-05-08 LAB — COMPREHENSIVE METABOLIC PANEL
ALT: 21 U/L (ref 17–63)
AST: 25 U/L (ref 15–41)
Albumin: 3.4 g/dL — ABNORMAL LOW (ref 3.5–5.0)
Alkaline Phosphatase: 111 U/L (ref 38–126)
Anion gap: 11 (ref 5–15)
BUN: 19 mg/dL (ref 6–20)
CHLORIDE: 102 mmol/L (ref 101–111)
CO2: 25 mmol/L (ref 22–32)
Calcium: 9.2 mg/dL (ref 8.9–10.3)
Creatinine, Ser: 1.44 mg/dL — ABNORMAL HIGH (ref 0.61–1.24)
GFR, EST AFRICAN AMERICAN: 51 mL/min — AB (ref >60)
GFR, EST NON AFRICAN AMERICAN: 44 mL/min — AB (ref >60)
Glucose, Bld: 103 mg/dL — ABNORMAL HIGH (ref 65–99)
POTASSIUM: 4.6 mmol/L (ref 3.5–5.1)
Sodium: 138 mmol/L (ref 135–145)
Total Bilirubin: 0.8 mg/dL (ref 0.3–1.2)
Total Protein: 7.3 g/dL (ref 6.5–8.1)

## 2016-05-08 LAB — CBC
HEMATOCRIT: 44.8 % (ref 39.0–52.0)
Hemoglobin: 14.6 g/dL (ref 13.0–17.0)
MCH: 30.7 pg (ref 26.0–34.0)
MCHC: 32.6 g/dL (ref 30.0–36.0)
MCV: 94.1 fL (ref 78.0–100.0)
PLATELETS: 181 10*3/uL (ref 150–400)
RBC: 4.76 MIL/uL (ref 4.22–5.81)
RDW: 13.9 % (ref 11.5–15.5)
WBC: 8.9 10*3/uL (ref 4.0–10.5)

## 2016-05-08 LAB — URINALYSIS, ROUTINE W REFLEX MICROSCOPIC
Bacteria, UA: NONE SEEN
Bilirubin Urine: NEGATIVE
Glucose, UA: NEGATIVE mg/dL
KETONES UR: NEGATIVE mg/dL
Leukocytes, UA: NEGATIVE
NITRITE: NEGATIVE
PROTEIN: 100 mg/dL — AB
Specific Gravity, Urine: 1.016 (ref 1.005–1.030)
Squamous Epithelial / LPF: NONE SEEN
pH: 5 (ref 5.0–8.0)

## 2016-05-08 MED ORDER — ACETAMINOPHEN 650 MG RE SUPP: 650.0000 mg | Freq: Four times a day (QID) | RECTAL | Status: DC | PRN

## 2016-05-08 MED ORDER — SENNOSIDES-DOCUSATE SODIUM 8.6-50 MG PO TABS
1.0000 | ORAL_TABLET | Freq: Every evening | ORAL | Status: DC | PRN
Start: 2016-05-08 — End: 2016-05-10

## 2016-05-08 MED ORDER — ACETAMINOPHEN 325 MG PO TABS
650.0000 mg | ORAL_TABLET | Freq: Four times a day (QID) | ORAL | Status: DC | PRN
  Administered 2016-05-10: 650 mg via ORAL
  Filled 2016-05-08: qty 2

## 2016-05-08 MED ORDER — ONDANSETRON HCL 4 MG/2ML IJ SOLN
4.0000 mg | Freq: Once | INTRAMUSCULAR | Status: AC
  Administered 2016-05-08: 4 mg via INTRAVENOUS
  Filled 2016-05-08: qty 2

## 2016-05-08 MED ORDER — SODIUM CHLORIDE 0.9 % IV BOLUS (SEPSIS)
500.0000 mL | Freq: Once | INTRAVENOUS | Status: AC
  Administered 2016-05-08: 500 mL via INTRAVENOUS

## 2016-05-08 MED ORDER — HYDROMORPHONE HCL 1 MG/ML IJ SOLN
1.0000 mg | Freq: Once | INTRAMUSCULAR | Status: AC
  Administered 2016-05-08: 1 mg via INTRAVENOUS
  Filled 2016-05-08: qty 1

## 2016-05-08 MED ORDER — ATORVASTATIN CALCIUM 10 MG PO TABS
20.0000 mg | ORAL_TABLET | Freq: Every day | ORAL | Status: DC
  Administered 2016-05-09 – 2016-05-10 (×2): 20 mg via ORAL
  Filled 2016-05-08: qty 1
  Filled 2016-05-08: qty 2

## 2016-05-08 MED ORDER — ONDANSETRON HCL 4 MG/2ML IJ SOLN: 4.0000 mg | Freq: Three times a day (TID) | INTRAMUSCULAR | Status: DC | PRN

## 2016-05-08 MED ORDER — VITAMIN D 1000 UNITS PO TABS
1000.0000 [IU] | ORAL_TABLET | Freq: Every day | ORAL | Status: DC
  Administered 2016-05-09 – 2016-05-10 (×2): 1000 [IU] via ORAL
  Filled 2016-05-08 (×2): qty 1

## 2016-05-08 MED ORDER — AMLODIPINE BESYLATE 5 MG PO TABS: 10.0000 mg | ORAL_TABLET | Freq: Every evening | ORAL | Status: DC

## 2016-05-08 MED ORDER — AMLODIPINE BESYLATE 5 MG PO TABS
5.0000 mg | ORAL_TABLET | Freq: Every evening | ORAL | Status: DC
  Administered 2016-05-09 – 2016-05-10 (×2): 5 mg via ORAL
  Filled 2016-05-08 (×2): qty 1

## 2016-05-08 MED ORDER — OXYCODONE-ACETAMINOPHEN 5-325 MG PO TABS
1.0000 | ORAL_TABLET | ORAL | Status: DC | PRN
  Administered 2016-05-08 – 2016-05-10 (×2): 1 via ORAL
  Filled 2016-05-08 (×2): qty 1

## 2016-05-08 MED ORDER — FENTANYL CITRATE (PF) 100 MCG/2ML IJ SOLN
75.0000 ug | Freq: Once | INTRAMUSCULAR | Status: AC
  Administered 2016-05-08: 75 ug via INTRAVENOUS
  Filled 2016-05-08: qty 2

## 2016-05-08 MED ORDER — ZOLPIDEM TARTRATE 5 MG PO TABS: 5.0000 mg | ORAL_TABLET | Freq: Every evening | ORAL | Status: DC | PRN

## 2016-05-08 MED ORDER — TAMSULOSIN HCL 0.4 MG PO CAPS
0.4000 mg | ORAL_CAPSULE | Freq: Every day | ORAL | Status: DC
  Administered 2016-05-09 – 2016-05-10 (×2): 0.4 mg via ORAL
  Filled 2016-05-08 (×2): qty 1

## 2016-05-08 MED ORDER — SODIUM CHLORIDE 0.9 % IV SOLN
INTRAVENOUS | Status: DC
  Administered 2016-05-08 – 2016-05-09 (×4): via INTRAVENOUS

## 2016-05-08 MED ORDER — HYDRALAZINE HCL 20 MG/ML IJ SOLN: 5.0000 mg | INTRAMUSCULAR | Status: DC | PRN

## 2016-05-08 MED ORDER — MORPHINE SULFATE (PF) 4 MG/ML IV SOLN: 2.0000 mg | INTRAVENOUS | Status: DC | PRN

## 2016-05-08 MED ORDER — FENTANYL CITRATE (PF) 100 MCG/2ML IJ SOLN
50.0000 ug | Freq: Once | INTRAMUSCULAR | Status: AC
  Administered 2016-05-08: 50 ug via INTRAVENOUS
  Filled 2016-05-08: qty 2

## 2016-05-08 MED ORDER — ALBUTEROL SULFATE (2.5 MG/3ML) 0.083% IN NEBU
2.5000 mg | INHALATION_SOLUTION | RESPIRATORY_TRACT | Status: DC | PRN
  Administered 2016-05-09: 2.5 mg via RESPIRATORY_TRACT
  Filled 2016-05-08: qty 3

## 2016-05-08 MED ORDER — SODIUM CHLORIDE 0.9 % IV BOLUS (SEPSIS): 1000.0000 mL | Freq: Once | INTRAVENOUS | Status: DC

## 2016-05-08 MED ORDER — NEBIVOLOL HCL 10 MG PO TABS
10.0000 mg | ORAL_TABLET | Freq: Every day | ORAL | Status: DC
  Administered 2016-05-09 – 2016-05-10 (×2): 10 mg via ORAL
  Filled 2016-05-08 (×3): qty 1

## 2016-05-08 MED ORDER — MORPHINE SULFATE (PF) 2 MG/ML IV SOLN
2.0000 mg | INTRAVENOUS | Status: DC | PRN
  Administered 2016-05-09: 2 mg via INTRAVENOUS
  Filled 2016-05-08: qty 1

## 2016-05-08 MED ORDER — TIOTROPIUM BROMIDE MONOHYDRATE 18 MCG IN CAPS
18.0000 ug | ORAL_CAPSULE | Freq: Every day | RESPIRATORY_TRACT | Status: DC
  Administered 2016-05-09 – 2016-05-10 (×2): 18 ug via RESPIRATORY_TRACT
  Filled 2016-05-08: qty 5

## 2016-05-08 MED ORDER — FINASTERIDE 5 MG PO TABS
5.0000 mg | ORAL_TABLET | Freq: Every morning | ORAL | Status: DC
  Administered 2016-05-09 – 2016-05-10 (×2): 5 mg via ORAL
  Filled 2016-05-08 (×2): qty 1

## 2016-05-08 MED ORDER — PANTOPRAZOLE SODIUM 40 MG PO TBEC
40.0000 mg | DELAYED_RELEASE_TABLET | Freq: Every day | ORAL | Status: DC
  Administered 2016-05-09 – 2016-05-10 (×2): 40 mg via ORAL
  Filled 2016-05-08 (×2): qty 1

## 2016-05-08 NOTE — H&P (Addendum)
History and Physical    Douglas Monroe OZH:[id number] DOB: 1932/12/24 DOA: 05/08/2016  Referring MD/NP/PA:   PCP: [name], MD   Patient coming from:  The patient is coming from home.  At baseline, pt is independent for most of ADL.      Chief Complaint: left flank pain and hematuria  HPI: Douglas Monroe is a 83 y.o. male with medical history significant of infrarenal AAA, anxiety, OA, COPD with emphysema, GERD, nephrolithiasis, HTN, HLD, ischemic colitis, significant PAD & PVD (moderate left carotid artery stenosis, left renal artery stent, bilateral iliac stent, right SFA stent), OSA, who present with left flank pain or hematuria.  Patient states that her left flank pain starting this morning, it has been constant, 10 out of 10 in severity, sharp, radiating to left groin area, lower back and left side of abdomen. It is not aggravated or alleviated by any factors. It is associated with hematuria. No symptoms of UTI. He has mild nausea, but no vomiting, diarrhea. Denies fever or chills. Patient has some mild shortness rest due to COP, which is at baseline. No chest pain, cough, palpitation, unilateral weakness.  ED Course: pt was found to have negative urinalysis, WBC 8.9, worsening renal function, temperature normal, no tachycardia, oxygen saturation 90-97% on room air, elevated blood pressure 184/94. Pt is admitted to med-surg bed as inpt. Urology, Dr. [name] was consulted.  # CT per renal stone protocol showed:  1. New hydronephrosis and perinephric stranding on the left. New left ureterectasis. No discrete stone is seen in the left ureter although the attenuation of the ureter after it crosses the iliac vessels is greater than the more proximal ureter. The findings could be seen with a radiolucent stone and hematuria, a recently passed stone with hematuria, hematuria from unknown cause, or an underlying urothelial neoplasm.  2. Chronic changes and volume loss in the left lower lobe.  There is increased density in the left lower lobe in addition to some nodularity. Since these findings are new since February 2018, radiologist suspects an infectious or inflammatory cause. Recommend follow-up to resolution. 3. Atherosclerosis. 4. Aneurysmal dilatation of the abdominal aorta measuring up to 3.5 cm. Recommend followup by ultrasound in 2 years.  Review of Systems:   General: no fevers, chills, no changes in body weight, has poor appetite, has fatigue HEENT: no blurry vision, hearing changes or sore throat Respiratory: has dyspnea, no coughing, wheezing CV: no chest pain, no palpitations GI: has nausea, no vomiting, abdominal pain, diarrhea, constipation GU: no dysuria, burning on urination, increased urinary frequency, has hematuria, left flank pain. Ext: no leg edema Neuro: no unilateral weakness, numbness, or tingling, no vision change or hearing loss Skin: no rash, no skin tear. MSK: No muscle spasm, no deformity, no limitation of range of movement in spin Heme: No easy bruising.  Travel history: No recent long distant travel.  Allergy:  Allergies  Allergen Reactions  . Pneumococcal Vaccines Swelling    Lip swelling    Past Medical History:  Diagnosis Date  . Abdominal aortic aneurysm ([place])    infrarenal -- monitored by dr berry (note states stable 4.0 to 4.1cm)  . Anxiety    situational  . Arthritis    "hands" (03/15/2016)  . Cancer of skin of temple 2017   right  . COPD with emphysema ([place])   . Dyspnea    with exertion  . Exertional dyspnea   . First degree heart block   . GERD (gastroesophageal reflux  disease)   . Headache(784.0)    uses goody powder; "maybe weekly, maybe not that much" (03/15/2016)  . History of kidney stones   . HOH (hard of hearing)    left ear; no hearing aid   . HTN (hypertension) 08/20/2011  . Hyperlipidemia   . Hypertension   . Ischemic colitis ([place])   . Left carotid artery stenosis    moderate  . Migraine    "long time  ago" (03/15/2016)  . 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 PVD (peripheral vascular disease) with claudication [facility]) cardiologist-  dr berry   s/p bilateral iliac stents and right sfa stenting/  doppper study 09-05-2012  revealed ABIs close to one bilaterally with patent stents/  stenting left renal artery stenosis   . Right ureteral stone   . S/P arterial stent    left renal angioplasty and stent 11-02-2012  . Sleep apnea    at risk for OSA. stop/bang score= 6; sent to PCP 10/04/13; denies use of mask on 03/15/2016  . Tubular adenoma of colon     Past Surgical History:  Procedure Laterality Date  . ABDOMINAL ANGIOGRAM  08/19/2011   Left common iliac artery at the ostium, 6x28mm ICAST covered stent, common femoral artery, 8x122mm Abbott absolute pro nitinol self-expanding stent and resulting in reduction of 80 and 90% stenosis to 0% residual  . ABDOMINAL ANGIOGRAM  10/08/2010   Mid-right SFA, 6x120 EV3 Protege nitinol self-expanding stent, resulting in reduction of CTO to 0% residual  . ABDOMINAL ANGIOGRAM  06/22/2010   Rt Common Femoral Artery-8x3 Absolute Pro Nitinol self-expanding stent-90% stenosis to 0% residual; Rt External Iliac Artery-9x3 Absolute Pro Nitinol self-expanding stent-70% stenosis to 0% residual; Left External Iliac Artery-8x4 Absolute Pro Nitinol self-expanding stent-90% to 0% residual  . ATHERECTOMY N/A 08/19/2011   Procedure: ATHERECTOMY;  Surgeon: Lorretta Harp, MD;  Location: [facility] C[facility] LAB;  Service: Cardiovascular;  Laterality: N/A;  . BACK SURGERY    . CARDIAC CATHETERIZATION  10/26/2002  dr Tami Ribas   normal coronary arteries/ normal  LVF  . CARDIOVASCULAR STRESS TEST  06-12-2010   dr berry   normal perfusion study/ no ischemia/  ef 69%  . COLONOSCOPY WITH PROPOFOL N/A 03/19/2016   Procedure: COLONOSCOPY WITH PROPOFOL;  Surgeon: Mauri Pole, MD;  Location: [facility];  Service: Endoscopy;  Laterality: N/A;  was inpatient, but got  d/c'd home and is coming back as an OP  . CYSTOSCOPY WITH RETROGRADE PYELOGRAM, URETEROSCOPY AND STENT PLACEMENT Right 10/10/2013   Procedure: uretheral dilation, CYSTOSCOPY, URETEROSCOPY, stone extraction, STENT PLACEMENT;  Surgeon: [name], MD;  Location: [facility];  Service: Urology;  Laterality: Right;  . HOLMIUM LASER APPLICATION Right 4/0/9811   Procedure: HOLMIUM LASER APPLICATION;  Surgeon: [name], MD;  Location: [facility];  Service: Urology;  Laterality: Right;  . LUMBAR DISC SURGERY  1970's  . RENAL ANGIOGRAM N/A 11/02/2012   Procedure: RENAL ANGIOGRAM;  Surgeon: Lorretta Harp, MD;  Location: [facility] CATH LAB;  Service: Cardiovascular;  Laterality: N/A;  . RENAL ARTERY STENT Left 11/02/2012  . SKIN CANCER EXCISION Right 2017   temple  . SOFT TISSUE CYST EXCISION  1960's   "outside part of lung LLL"    Social History:  reports that he quit smoking about 5 years ago. His smoking use included Cigarettes. He has a 60.00 pack-year smoking history. He has never used smokeless tobacco. He reports that he does not drink  alcohol or use drugs.  Family History:  Family History  Problem Relation Age of Onset  . Hypertension Mother   . Cancer Father   . Diabetes Brother   . Hypertension Son      Prior to Admission medications   Medication Sig Start Date End Date Taking? Authorizing Provider  albuterol (PROVENTIL HFA;VENTOLIN HFA) 108 (90 BASE) MCG/ACT inhaler Inhale 2 puffs into the lungs every 6 (six) hours as needed for shortness of breath.    Yes Historical Provider, MD  amLODipine (NORVASC) 5 MG tablet Take 5 mg by mouth every evening.   Yes Historical Provider, MD  atorvastatin (LIPITOR) 20 MG tablet TAKE 1 TABLET BY MOUTH  DAILY AT 6 PM. 01/14/16  Yes Lorretta Harp, MD  BYSTOLIC 10 MG tablet Take 10 mg by mouth daily. 02/23/16  Yes Historical Provider, MD  clopidogrel (PLAVIX) 75 MG tablet Take 75 mg by mouth daily.   Yes Historical  Provider, MD  [employer] D3 2000 units CAPS Take 1 capsule by mouth daily. 01/31/16  Yes Historical Provider, MD  finasteride (PROSCAR) 5 MG tablet Take 5 mg by mouth every morning.   Yes Historical Provider, MD  pantoprazole (PROTONIX) 40 MG tablet Take 40 mg by mouth daily. 01/21/16  Yes Historical Provider, MD  tamsulosin (FLOMAX) 0.4 MG CAPS capsule Take 1 capsule by mouth daily. 07/13/13  Yes Historical Provider, MD  tiotropium (SPIRIVA) 18 MCG inhalation capsule Place 18 mcg into inhaler and inhale daily.   Yes Historical Provider, MD  valsartan (DIOVAN) 160 MG tablet Take 1 tablet (160 mg total) by mouth daily. Patient not taking: Reported on 05/08/2016 03/19/16   Reyne Dumas, MD    Physical Exam: Vitals:   05/08/16 1730 05/08/16 1815 05/08/16 1845 05/08/16 1900  BP: (!) 146/89 135/69 (!) 160/83 121/87  Pulse: 76 67 78 77  Resp: 14 16 14    Temp:      TempSrc:      SpO2: 97% 93% 97% 94%   General: Not in acute distress HEENT:       Eyes: PERRL, EOMI, no scleral icterus.       ENT: No discharge from the ears and nose, no pharynx injection, no tonsillar enlargement.        Neck: No JVD, no bruit, no mass felt. Heme: No neck lymph node enlargement. Cardiac: S1/S2, RRR, No murmurs, No gallops or rubs. Respiratory: No rales, wheezing, rhonchi or rubs. GI: Soft, nondistended, nontender, no rebound pain, no organomegaly, BS present. GU: Positive L CVA tenderness.  Ext: No pitting leg edema bilaterally. 2+DP/PT pulse bilaterally. Musculoskeletal: No joint deformities, No joint redness or warmth, no limitation of ROM in spin. Skin: No rashes.  Neuro: Alert, oriented X3, cranial nerves II-XII grossly intact, moves all extremities normally. Psych: Patient is not psychotic, no suicidal or hemocidal ideation.  Labs on Admission: I have personally reviewed following labs and imaging studies  CBC:  Recent Labs Lab 05/08/16 1438  WBC 8.9  HGB 14.6  HCT 44.8  MCV 94.1  PLT 810   Basic  Metabolic Panel:  Recent Labs Lab 05/08/16 1438  NA 138  K 4.6  CL 102  CO2 25  GLUCOSE 103*  BUN 19  CREATININE 1.44*  CALCIUM 9.2   GFR: Estimated Creatinine Clearance: 37 mL/min (A) (by C-G formula based on SCr of 1.44 mg/dL (H)). Liver Function Tests:  Recent Labs Lab 05/08/16 1438  AST 25  ALT 21  ALKPHOS 111  BILITOT 0.8  PROT 7.3  ALBUMIN 3.4*   No results for input(s): LIPASE, AMYLASE in the last 168 hours. No results for input(s): AMMONIA in the last 168 hours. Coagulation Profile: No results for input(s): INR, PROTIME in the last 168 hours. Cardiac Enzymes: No results for input(s): CKTOTAL, CKMB, CKMBINDEX, TROPONINI in the last 168 hours. BNP (last 3 results) No results for input(s): PROBNP in the last 8760 hours. HbA1C: No results for input(s): HGBA1C in the last 72 hours. CBG: No results for input(s): GLUCAP in the last 168 hours. Lipid Profile: No results for input(s): CHOL, HDL, LDLCALC, TRIG, CHOLHDL, LDLDIRECT in the last 72 hours. Thyroid Function Tests: No results for input(s): TSH, T4TOTAL, FREET4, T3FREE, THYROIDAB in the last 72 hours. Anemia Panel: No results for input(s): VITAMINB12, FOLATE, FERRITIN, TIBC, IRON, RETICCTPCT in the last 72 hours. Urine analysis:    Component Value Date/Time   COLORURINE AMBER (A) 05/08/2016 1432   APPEARANCEUR CLOUDY (A) 05/08/2016 1432   LABSPEC 1.016 05/08/2016 1432   PHURINE 5.0 05/08/2016 1432   GLUCOSEU NEGATIVE 05/08/2016 1432   HGBUR LARGE (A) 05/08/2016 1432   BILIRUBINUR NEGATIVE 05/08/2016 1432   KETONESUR NEGATIVE 05/08/2016 1432   PROTEINUR 100 (A) 05/08/2016 1432   UROBILINOGEN 0.2 04/24/2013 0238   NITRITE NEGATIVE 05/08/2016 1432   LEUKOCYTESUR NEGATIVE 05/08/2016 1432   Sepsis Labs: @LABRCNTIP (procalcitonin:4,lacticidven:4) )No results found for this or any previous visit (from the past 240 hour(s)).   Radiological Exams on Admission: Ct Renal Stone Study  Result Date:  05/08/2016 CLINICAL DATA:  Right lower back pain since this morning. EXAM: CT ABDOMEN AND PELVIS WITHOUT CONTRAST TECHNIQUE: Multidetector CT imaging of the abdomen and pelvis was performed following the standard protocol without IV contrast. COMPARISON:  March 16, 2016 FINDINGS: Lower chest: Chronic changes and volume loss seen in the left lower lobe with bronchial wall thickening. There is increased density within the left lower lobe including a nodular component measuring 7 mm on series 6, image 3 not seen in February 2018. Lung bases are otherwise unremarkable. Hepatobiliary: No focal liver abnormality is seen. No gallstones, gallbladder wall thickening, or biliary dilatation. Pancreas: Unremarkable. No pancreatic ductal dilatation or surrounding inflammatory changes. Spleen: Normal in size without focal abnormality. Adrenals/Urinary Tract: The adrenal glands are normal. Cysts associated with the right kidney are again identified. No hydronephrosis or perinephric stranding on the right. There is mild to moderate hydronephrosis on the left which is new. There is perinephric stranding on the left as well which is new. The left ureter is dilated along much of its length. No discrete stone is identified. However, there is relatively higher attenuation in the left ureter just after crosses over the iliac vessels such as on axial image 55. No stones seen in the bladder. The bladder is unremarkable. Stomach/Bowel: The stomach and small bowel are normal. Scattered tiny colonic diverticuli are seen without diverticulitis. The colon and appendix are normal. Vascular/Lymphatic: Dense atherosclerosis is seen in the abdominal aorta. Stents are seen in the left renal artery and iliac vessels. There is aneurysmal dilatation of the infrarenal abdominal aorta measuring measuring 3.5 cm today versus 3.4 cm previously, not changed given difference in slice selection. No periaortic stranding or fluid. No adenopathy. Reproductive:  The prostate is enlarged exerting mass effect on the bladder. This is stable. Other: No other acute changes. Musculoskeletal: No acute or significant osseous findings. IMPRESSION: 1. New hydronephrosis and perinephric stranding on the left. New left ureterectasis. No discrete stone is seen in the left  ureter although the attenuation of the ureter after it crosses the iliac vessels is greater than the more proximal ureter. The findings could be seen with a radiolucent stone and hematuria, a recently passed stone with hematuria, hematuria from unknown cause, or an underlying urothelial neoplasm. Recommend clinical correlation and consider urologic consultation. 2. Chronic changes and volume loss in the left lower lobe. There is increased density in the left lower lobe in addition to some nodularity. Since these findings are new since February 2018, I suspect an infectious or inflammatory cause. Recommend follow-up to resolution. 3. Atherosclerosis. 4. Aneurysmal dilatation of the abdominal aorta measuring up to 3.5 cm. Recommend followup by ultrasound in 2 years. This recommendation follows ACR consensus guidelines: White Paper of the ACR Incidental Findings Committee II on Vascular Findings. J Am Coll Radiol 2013; 10:789-794. Electronically Signed   By: Dorise Bullion III M.D   On: 05/08/2016 16:35     EKG: Not done in ED, will get one.   Assessment/Plan Principal Problem:   Hydronephrosis Active Problems:   PVD S/P multiple PCIs   COPD (chronic obstructive pulmonary disease) (HCC)   HTN (hypertension)   Abdominal aortic aneurysm (HCC)   Hyperlipidemia   Acute renal failure superimposed on stage 2 chronic kidney disease (HCC)   Left Hydronephrosis: CT scan showed new hydronephrosis and perinephric stranding on the left. New left ureterectasis. No discrete stone is seen in the left ureter. Per radiologist, the findings could be seen with a radiolucent stone and hematuria, a recently passed stone  with hematuria, hematuria from unknown cause, or an underlying urothelial neoplasm. Urology, Dr. [name] was consulted-->planning on taking the patient to OR tomorrow for cystoscopy, left ureteral stent placement, left retrograde pyelogram, left ureteroscopy with possible biopsy/fulguration. Pt does not have symptoms of UTI. Urinalysis is negative. No leukocytosis or fever.  -will admit to med-surg bed as inpt -Highly appreciate urology's consultation, will follow up recommendations. -Per Dr. [name], "No Foley catheter currently required however consider PVR and placement if the patient starts endorsing symptoms suggestive of urinary retention" -When necessary Percocet and morphine for pain, Zofran for nausea -will hold plavix -INR/PTT/type & screen -NPO after MN -IVF: 500 cc of NS in ED, then 75 cc/h  PVD S/P multiple PCIs: -continue lipitor -hold plavix  COPD: stable -When necessary albuterol nebulizer -spiriva inhaler  HTN: Blood pressure 184/94, partially due to pain -hold Diovan due to worsening renal function -amlodipine 5 daily -continue Bystlic -IVF hydralazine when necessary  Abdominal aortic aneurysm [facility]): Chart review showed that AAA size was 4.0-4.1 cm. Today CT scan showed 3.5 cm in size. -f/u with PCP  Hyperlipidemia: -lipitor  Acute renal failure superimposed on stage 2 chronic kidney disease ([place]): Baseline creatinine 1.0-1.2. His creatinine 1.44, BUN 19. Most likely due to hydronephrosis. -check FeNA -hold diovan -f/u by BMP  Chronic changes and volume loss in the left lower lobe:  This is an incidental finding from CT scan. There is also increased density in the left lower lobe in addition to some nodularity. Since these findings are new since February 2018, radiologist suspects an infectious or inflammatory cause. Recommend follow-up to resolution. -f/u with PCP  BPH: stable - Continue Flomax and proscar    DVT ppx: SCD Code Status: Full code Family  Communication: Yes, patient's wife at bed side Disposition Plan:  Anticipate discharge back to previous home environment Consults called:  Urology, dr. [name] Admission status: medical floor/inpt  Date of Service 05/08/2016    Ivor Costa  [facility] Pager [phone]  If 7PM-7AM, please contact night-coverage [email or web address] Password [email or web address] 05/08/2016, 8:01 PM

## 2016-05-08 NOTE — ED Triage Notes (Addendum)
Pt reports urinating blood since this am. Having left side lower back pain that radiates around his side and into his groin. Hx of kidney stones. Appears pale and in severe pain at triage.

## 2016-05-08 NOTE — ED Notes (Signed)
Pt to ct 

## 2016-05-08 NOTE — Consult Note (Signed)
New Urology Consult Note   Requesting Attending Physician:  Duffy Bruce, MD Service Providing Consult: Urology Consulting Attending: Jeffie Pollock   Assessment:  Patient is a 83 y.o. male with MMP and peripheral vascular disease as noted in the history of present illness including stenting of the left renal artery with known history of microhematuria and nephrolithiasis who presents with a 1-2 day history of gross hematuria with left flank pain. He does not have concern for infection. His hydroureteronephrosis appears to track down to the level of the mid left ureter with high density tissue at this spot consistent with blood clot or possible malignant process. Alternatively he could have just passed a left-sided ureteral calculus although would expect his hydroureteronephrosis to track down to the level of the bladder. He has required multiple IV pain medications in the ER for pain control and does not feel comfortable with outpatient management of his current issues.  Recommendations: 1. Recommend admission to medicine service for assistance with multiple medical problems and pain control with urologic consultation 2. Please transfer to [facility] 3. Please keep NPO at 12am. We will tentatively plan on taking the patient to OR tomorrow for cystoscopy, left ureteral stent placement, left retrograde pyelogram, left ureteroscopy with possible biopsy/fulguration 4. No Foley catheter currently required however consider PVR and placement if the patient starts endorsing symptoms suggestive of urinary retention 5. Hold plavix for now if possible  Thank you for this consult. Please contact the urology consult pager with any further questions/concerns. Sharmaine Base, MD Urology Surgical Resident  HPI -   Reason for Consult:  Left flank pain  Douglas Monroe is seen in consultation for reasons noted above at the request of Duffy Bruce, MD.  This is a 83 yo patient with a history of  infrarenal AAA, anxiety, OA, COPD with emphysema, GERD, nephrolithiasis, HTN, HLD, ischemic colitis, significant PAD & PVD (moderate left carotid artery stenosis, left renal artery stent, bilateral iliac stent, right SFA stent, OSA.   He has been followed by Dr. [name] in the past for nephrolithiasis and microhematuria. He has undergone right ureteroscopy in the past with ureteral stent placement. He called the [facility] on-call pager earlier today with complaints of gross hematuria and left-sided flank pain. When I spoke to him on the phone earlier his pain had subsided and he was not having fevers, nausea, vomiting. Unfortunately in the interim he had severe recurrence of his left back and flank pain that was a 10 out of 10. He presented to the [facility] ER with such symptoms. His gross hematuria continues and started about 1-1/2 days ago. Prior to this he never noticed gross hematuria at all. He has had decreased urine volumes but has no urge to urinate and denies clot passage and the urine. There is no clinical concern for urinary retention at this time.  He denies fevers. He reports nausea that started earlier today with dry heaves but no emesis. He denies any diarrhea, constipation, worsening shortness of breath over his baseline from COPD, or chest pain. He does not wear oxygen at home.  He is [place] previous smoker, quit 2012. Previously smoked 1.5 PPD for 60 years. No family history of GU malignancy that he is aware of. He is on Plavix anticoagulation.  He is afebrile here with vital signs are stable besides hypertension. His creatinine is 1.44, up from 1.1 on his last check. He has no leukocytosis. His hemoglobin is 14.6. His urinalysis does show too numerous to  count RBCs per high-power field but is not indicative of infection. CT renal stone study was performed which shows left perinephric stranding with mild to moderate left-sided hydronephrosis down to the level of the mid left ureter with  high density material at this point which could either represent a blood clot or a malignancy. His pain is improved to a 3 out of 10 currently but he is not comfortable with going home.   Past Medical History: Past Medical History:  Diagnosis Date  . Abdominal aortic aneurysm ([place])    infrarenal -- monitored by dr berry (note states stable 4.0 to 4.1cm)  . Anxiety    situational  . Arthritis    "hands" (03/15/2016)  . Cancer of skin of temple 2017   right  . COPD with emphysema ([place])   . Dyspnea    with exertion  . Exertional dyspnea   . First degree heart block   . GERD (gastroesophageal reflux disease)   . Headache(784.0)    uses goody powder; "maybe weekly, maybe not that much" (03/15/2016)  . History of kidney stones   . HOH (hard of hearing)    left ear; no hearing aid   . HTN (hypertension) 08/20/2011  . Hyperlipidemia   . Hypertension   . Ischemic colitis ([place])   . Left carotid artery stenosis    moderate  . Migraine    "long time ago" (03/15/2016)  . 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 PVD (peripheral vascular disease) with claudication [facility]) cardiologist-  dr berry   s/p bilateral iliac stents and right sfa stenting/  doppper study 09-05-2012  revealed ABIs close to one bilaterally with patent stents/  stenting left renal artery stenosis   . Right ureteral stone   . S/P arterial stent    left renal angioplasty and stent 11-02-2012  . Sleep apnea    at risk for OSA. stop/bang score= 6; sent to PCP 10/04/13; denies use of mask on 03/15/2016  . Tubular adenoma of colon     Past Surgical History:  Past Surgical History:  Procedure Laterality Date  . ABDOMINAL ANGIOGRAM  08/19/2011   Left common iliac artery at the ostium, 6x71mm ICAST covered stent, common femoral artery, 8x195mm Abbott absolute pro nitinol self-expanding stent and resulting in reduction of 80 and 90% stenosis to 0% residual  . ABDOMINAL ANGIOGRAM  10/08/2010   Mid-right  SFA, 6x120 EV3 Protege nitinol self-expanding stent, resulting in reduction of CTO to 0% residual  . ABDOMINAL ANGIOGRAM  06/22/2010   Rt Common Femoral Artery-8x3 Absolute Pro Nitinol self-expanding stent-90% stenosis to 0% residual; Rt External Iliac Artery-9x3 Absolute Pro Nitinol self-expanding stent-70% stenosis to 0% residual; Left External Iliac Artery-8x4 Absolute Pro Nitinol self-expanding stent-90% to 0% residual  . ATHERECTOMY N/A 08/19/2011   Procedure: ATHERECTOMY;  Surgeon: Lorretta Harp, MD;  Location: [facility] CATH LAB;  Service: Cardiovascular;  Laterality: N/A;  . BACK SURGERY    . CARDIAC CATHETERIZATION  10/26/2002  dr Tami Ribas   normal coronary arteries/ normal  LVF  . CARDIOVASCULAR STRESS TEST  06-12-2010   dr berry   normal perfusion study/ no ischemia/  ef 69%  . COLONOSCOPY WITH PROPOFOL N/A 03/19/2016   Procedure: COLONOSCOPY WITH PROPOFOL;  Surgeon: Mauri Pole, MD;  Location: [facility] ENDOSCOPY;  Service: Endoscopy;  Laterality: N/A;  was inpatient, but got d/c'd home and is coming back as an OP  . CYSTOSCOPY WITH RETROGRADE PYELOGRAM, URETEROSCOPY AND STENT PLACEMENT Right 10/10/2013  Procedure: uretheral dilation, CYSTOSCOPY, URETEROSCOPY, stone extraction, STENT PLACEMENT;  Surgeon: [name], MD;  Location[facility];  Service: Urology;  Laterality: Right;  . HOLMIUM LASER APPLICATION Right 0/0/9233   Procedure: HOLMIUM LASER APPLICATION;  Surgeon: [name], MD;  Location: [facility];  Service: Urology;  Laterality: Right;  . LUMBAR DISC SURGERY  1970's  . RENAL ANGIOGRAM N/A 11/02/2012   Procedure: RENAL ANGIOGRAM;  Surgeon: Lorretta Harp, MD;  Location: [facility] CATH LAB;  Service: Cardiovascular;  Laterality: N/A;  . RENAL ARTERY STENT Left 11/02/2012  . SKIN CANCER EXCISION Right 2017   temple  . SOFT TISSUE CYST EXCISION  1960's   "outside part of lung LLL"    Medication: No current facility-administered medications for  this encounter.    Current Outpatient Prescriptions  Medication Sig Dispense Refill  . albuterol (PROVENTIL HFA;VENTOLIN HFA) 108 (90 BASE) MCG/ACT inhaler Inhale 2 puffs into the lungs every 6 (six) hours as needed for shortness of breath.     [name] amLODipine (NORVASC) 5 MG tablet Take 5 mg by mouth every evening.    [name] atorvastatin (LIPITOR) 20 MG tablet TAKE 1 TABLET BY MOUTH  DAILY AT 6 PM. 90 tablet 3  . BYSTOLIC 10 MG tablet Take 10 mg by mouth daily.    . clopidogrel (PLAVIX) 75 MG tablet Take 75 mg by mouth daily.    . [employer] D3 2000 units CAPS Take 1 capsule by mouth daily.  5  . finasteride (PROSCAR) 5 MG tablet Take 5 mg by mouth every morning.    . pantoprazole (PROTONIX) 40 MG tablet Take 40 mg by mouth daily.  5  . tamsulosin (FLOMAX) 0.4 MG CAPS capsule Take 1 capsule by mouth daily.    [name] tiotropium (SPIRIVA) 18 MCG inhalation capsule Place 18 mcg into inhaler and inhale daily.    . valsartan (DIOVAN) 160 MG tablet Take 1 tablet (160 mg total) by mouth daily. (Patient not taking: Reported on 05/08/2016) 30 tablet 0    Allergies: Allergies  Allergen Reactions  . Pneumococcal Vaccines Swelling    Lip swelling    Social History: Social History  Substance Use Topics  . Smoking status: Former Smoker    Packs/day: 1.00    Years: 60.00    Types: Cigarettes    Quit date: 06/02/2010  . Smokeless tobacco: Never Used  . Alcohol use No    Family History Family History  Problem Relation Age of Onset  . Hypertension Mother   . Cancer Father   . Diabetes Brother   . Hypertension Son     Review of Systems 10 systems were reviewed and are negative except as noted specifically in the HPI.  Objective   Vital signs in last 24 hours: BP 135/69   Pulse 67   Temp 97.8 F (36.6 C) (Oral)   Resp 16   SpO2 93%   Intake/Output last 3 shifts: No intake/output data recorded.  Physical Exam General: NAD, A&O, resting, appropriate HEENT: [place]/AT, EOMI, MMM Pulmonary: Normal work of  breathing on [place] Cardiovascular: Regular rate & rhythm, HDS, adequate peripheral perfusion Abdomen: soft, TTP in left flank, mildly distended, no suprapubic fullness or tenderness GU: voiding spontaneously, no appreciable CVA tenderness DRE: deferred Extremities: warm and well perfused, no edema  Most Recent Labs: Lab Results  Component Value Date   WBC 8.9 05/08/2016   HGB 14.6 05/08/2016   HCT 44.8 05/08/2016   PLT 181 05/08/2016  Lab Results  Component Value Date   NA 138 05/08/2016   K 4.6 05/08/2016   CL 102 05/08/2016   CO2 25 05/08/2016   BUN 19 05/08/2016   CREATININE 1.44 (H) 05/08/2016   CALCIUM 9.2 05/08/2016    Lab Results  Component Value Date   ALKPHOS 111 05/08/2016   BILITOT 0.8 05/08/2016   BILIDIR 0.1 03/06/2013   PROT 7.3 05/08/2016   ALBUMIN 3.4 (L) 05/08/2016   ALT 21 05/08/2016   AST 25 05/08/2016    Lab Results  Component Value Date   INR 0.89 10/26/2012   APTT 37 10/26/2012     Urine Culture: None   IMAGING: Ct Renal Stone Study  Result Date: 05/08/2016 CLINICAL DATA:  Right lower back pain since this morning. EXAM: CT ABDOMEN AND PELVIS WITHOUT CONTRAST TECHNIQUE: Multidetector CT imaging of the abdomen and pelvis was performed following the standard protocol without IV contrast. COMPARISON:  March 16, 2016 FINDINGS: Lower chest: Chronic changes and volume loss seen in the left lower lobe with bronchial wall thickening. There is increased density within the left lower lobe including a nodular component measuring 7 mm on series 6, image 3 not seen in February 2018. Lung bases are otherwise unremarkable. Hepatobiliary: No focal liver abnormality is seen. No gallstones, gallbladder wall thickening, or biliary dilatation. Pancreas: Unremarkable. No pancreatic ductal dilatation or surrounding inflammatory changes. Spleen: Normal in size without focal abnormality. Adrenals/Urinary Tract: The adrenal glands are normal. Cysts associated with the  right kidney are again identified. No hydronephrosis or perinephric stranding on the right. There is mild to moderate hydronephrosis on the left which is new. There is perinephric stranding on the left as well which is new. The left ureter is dilated along much of its length. No discrete stone is identified. However, there is relatively higher attenuation in the left ureter just after crosses over the iliac vessels such as on axial image 55. No stones seen in the bladder. The bladder is unremarkable. Stomach/Bowel: The stomach and small bowel are normal. Scattered tiny colonic diverticuli are seen without diverticulitis. The colon and appendix are normal. Vascular/Lymphatic: Dense atherosclerosis is seen in the abdominal aorta. Stents are seen in the left renal artery and iliac vessels. There is aneurysmal dilatation of the infrarenal abdominal aorta measuring measuring 3.5 cm today versus 3.4 cm previously, not changed given difference in slice selection. No periaortic stranding or fluid. No adenopathy. Reproductive: The prostate is enlarged exerting mass effect on the bladder. This is stable. Other: No other acute changes. Musculoskeletal: No acute or significant osseous findings. IMPRESSION: 1. New hydronephrosis and perinephric stranding on the left. New left ureterectasis. No discrete stone is seen in the left ureter although the attenuation of the ureter after it crosses the iliac vessels is greater than the more proximal ureter. The findings could be seen with a radiolucent stone and hematuria, a recently passed stone with hematuria, hematuria from unknown cause, or an underlying urothelial neoplasm. Recommend clinical correlation and consider urologic consultation. 2. Chronic changes and volume loss in the left lower lobe. There is increased density in the left lower lobe in addition to some nodularity. Since these findings are new since February 2018, I suspect an infectious or inflammatory cause. Recommend  follow-up to resolution. 3. Atherosclerosis. 4. Aneurysmal dilatation of the abdominal aorta measuring up to 3.5 cm. Recommend followup by ultrasound in 2 years. This recommendation follows ACR consensus guidelines: White Paper of the ACR Incidental Findings Committee II on Vascular Findings.  J Am Coll Radiol 2013; 10:789-794. Electronically Signed   By: Dorise Bullion III M.D   On: 05/08/2016 16:35   Pt interviewed and examined.   History reviewed.   Pertinent films and reports reviewed.  Labs reviewed.   I have discussed the risks and benefits of cystoscopy with stenting and possible ureteroscopy with the patient including bleeding, infection, ureteral injury, need for a perc or secondary procedures, thrombotic events and anesthetic complications.

## 2016-05-08 NOTE — ED Provider Notes (Signed)
[facility] DEPT Provider Note   CSN: [id number] Arrival date & time: 05/08/16  1401     History   Chief Complaint Chief Complaint  Patient presents with  . Hematuria  . Back Pain    HPI Douglas Monroe is a 83 y.o. male.  HPI 83 yo M with PMHx as below here with left flank pain. Pt states that he awoke this AM with a dull, aching left flank pain. He states he woke up, went to the bathroom, then started walking around his house as usual. He noticed a mild aching, dull left flank pain. Over the day, his pain has worsened and intermittently moved towards his LLQ. He has associated nausea and vomiting. No fevers. No dysuria. H/o stones with similar sx and has required stenting in the past.   Past Medical History:  Diagnosis Date  . Abdominal aortic aneurysm ([place])    infrarenal -- monitored by dr berry (note states stable 4.0 to 4.1cm)  . Anxiety    situational  . Arthritis    "hands" (03/15/2016)  . Cancer of skin of temple 2017   right  . COPD with emphysema ([place])   . Dyspnea    with exertion  . Exertional dyspnea   . First degree heart block   . GERD (gastroesophageal reflux disease)   . Headache(784.0)    uses goody powder; "maybe weekly, maybe not that much" (03/15/2016)  . History of kidney stones   . HOH (hard of hearing)    left ear; no hearing aid   . HTN (hypertension) 08/20/2011  . Hyperlipidemia   . Hypertension   . Ischemic colitis ([place])   . Left carotid artery stenosis    moderate  . Migraine    "long time ago" (03/15/2016)  . 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 PVD (peripheral vascular disease) with claudication [facility]) cardiologist-  dr berry   s/p bilateral iliac stents and right sfa stenting/  doppper study 09-05-2012  revealed ABIs close to one bilaterally with patent stents/  stenting left renal artery stenosis   . Right ureteral stone   . S/P arterial stent    left renal angioplasty and stent 11-02-2012  . Sleep apnea     at risk for OSA. stop/bang score= 6; sent to PCP 10/04/13; denies use of mask on 03/15/2016  . Tubular adenoma of colon     Patient Active Problem List   Diagnosis Date Noted  . Hydronephrosis 05/08/2016  . Acute renal failure superimposed on stage 2 chronic kidney disease ([place]) 05/08/2016  . Polyp of cecum   . Polyp of transverse colon   . BRBPR (bright red blood per rectum)   . Hematochezia   . Ischemic colitis ([place])   . Ulceration, colon   . Left sided abdominal pain   . Rectal bleeding 03/15/2016  . Acute hyperglycemia 03/15/2016  . COPD exacerbation ([place])   . Renal insufficiency   . PVD (peripheral vascular disease) ([place])   . Ureteral calculus 10/10/2013  . Hyperlipidemia 07/24/2013  . S/P arterial stent, to Lt renal art. 11/02/12 11/03/2012  . Carotid artery disease ([place]) 10/06/2012  . Renal artery stenosis, progression of disease 10/06/2012  . Abdominal aortic aneurysm ([place]) 10/06/2012  . Normal coronary arteries-  07/21/2012  . PVD (peripheral vascular disease) with claudication, previous stents to bilateral iliacs. Rt. SFA also.  08/20/2011  . Claudication of both lower extremities ([place]) 08/20/2011  . PVD S/P multiple PCIs 08/20/2011  .  COPD (chronic obstructive pulmonary disease) ([place]) 08/20/2011  . HTN (hypertension) 08/20/2011    Past Surgical History:  Procedure Laterality Date  . ABDOMINAL ANGIOGRAM  08/19/2011   Left common iliac artery at the ostium, 6x7mm ICAST covered stent, common femoral artery, 8x134mm Abbott absolute pro nitinol self-expanding stent and resulting in reduction of 80 and 90% stenosis to 0% residual  . ABDOMINAL ANGIOGRAM  10/08/2010   Mid-right SFA, 6x120 EV3 Protege nitinol self-expanding stent, resulting in reduction of CTO to 0% residual  . ABDOMINAL ANGIOGRAM  06/22/2010   Rt Common Femoral Artery-8x3 Absolute Pro Nitinol self-expanding stent-90% stenosis to 0% residual; Rt External Iliac Artery-9x3 Absolute Pro Nitinol self-expanding  stent-70% stenosis to 0% residual; Left External Iliac Artery-8x4 Absolute Pro Nitinol self-expanding stent-90% to 0% residual  . ATHERECTOMY N/A 08/19/2011   Procedure: ATHERECTOMY;  Surgeon: Lorretta Harp, MD;  Location: [facility] CATH LAB;  Service: Cardiovascular;  Laterality: N/A;  . BACK SURGERY    . CARDIAC CATHETERIZATION  10/26/2002  dr Tami Ribas   normal coronary arteries/ normal  LVF  . CARDIOVASCULAR STRESS TEST  06-12-2010   dr berry   normal perfusion study/ no ischemia/  ef 69%  . COLONOSCOPY WITH PROPOFOL N/A 03/19/2016   Procedure: COLONOSCOPY WITH PROPOFOL;  Surgeon: Mauri Pole, MD;  Location: [facility] ENDOSCOPY;  Service: Endoscopy;  Laterality: N/A;  was inpatient, but got d/c'd home and is coming back as an OP  . CYSTOSCOPY WITH RETROGRADE PYELOGRAM, URETEROSCOPY AND STENT PLACEMENT Right 10/10/2013   Procedure: uretheral dilation, CYSTOSCOPY, URETEROSCOPY, stone extraction, STENT PLACEMENT;  Surgeon: [name], MD;  Location: [facility];  Service: Urology;  Laterality: Right;  . HOLMIUM LASER APPLICATION Right 10/02/4780   Procedure: HOLMIUM LASER APPLICATION;  Surgeon: [name], MD;  Location[facility];  Service: Urology;  Laterality: Right;  . LUMBAR DISC SURGERY  1970's  . RENAL ANGIOGRAM N/A 11/02/2012   Procedure: RENAL ANGIOGRAM;  Surgeon: Lorretta Harp, MD;  Location: [facility] CATH LAB;  Service: Cardiovascular;  Laterality: N/A;  . RENAL ARTERY STENT Left 11/02/2012  . SKIN CANCER EXCISION Right 2017   temple  . SOFT TISSUE CYST EXCISION  1960's   "outside part of lung LLL"       Home Medications    Prior to Admission medications   Medication Sig Start Date End Date Taking? Authorizing Provider  albuterol (PROVENTIL HFA;VENTOLIN HFA) 108 (90 BASE) MCG/ACT inhaler Inhale 2 puffs into the lungs every 6 (six) hours as needed for shortness of breath.    Yes Historical Provider, MD  amLODipine (NORVASC) 5 MG tablet Take 5 mg by  mouth every evening.   Yes Historical Provider, MD  atorvastatin (LIPITOR) 20 MG tablet TAKE 1 TABLET BY MOUTH  DAILY AT 6 PM. 01/14/16  Yes Lorretta Harp, MD  BYSTOLIC 10 MG tablet Take 10 mg by mouth daily. 02/23/16  Yes Historical Provider, MD  clopidogrel (PLAVIX) 75 MG tablet Take 75 mg by mouth daily.   Yes Historical Provider, MD  [employer]S D3 2000 units CAPS Take 1 capsule by mouth daily. 01/31/16  Yes Historical Provider, MD  finasteride (PROSCAR) 5 MG tablet Take 5 mg by mouth every morning.   Yes Historical Provider, MD  pantoprazole (PROTONIX) 40 MG tablet Take 40 mg by mouth daily. 01/21/16  Yes Historical Provider, MD  tamsulosin (FLOMAX) 0.4 MG CAPS capsule Take 1 capsule by mouth daily. 07/13/13  Yes Historical Provider, MD  tiotropium [facility])  18 MCG inhalation capsule Place 18 mcg into inhaler and inhale daily.   Yes Historical Provider, MD  valsartan (DIOVAN) 160 MG tablet Take 1 tablet (160 mg total) by mouth daily. Patient not taking: Reported on 05/08/2016 03/19/16   Reyne Dumas, MD    Family History Family History  Problem Relation Age of Onset  . Hypertension Mother   . Cancer Father   . Diabetes Brother   . Hypertension Son     Social History Social History  Substance Use Topics  . Smoking status: Former Smoker    Packs/day: 1.00    Years: 60.00    Types: Cigarettes    Quit date: 06/02/2010  . Smokeless tobacco: Never Used  . Alcohol use No     Allergies   Pneumococcal vaccines   Review of Systems Review of Systems  Constitutional: Positive for fatigue. Negative for chills and fever.  HENT: Negative for congestion and rhinorrhea.   Eyes: Negative for visual disturbance.  Respiratory: Negative for cough, shortness of breath and wheezing.   Cardiovascular: Negative for chest pain and leg swelling.  Gastrointestinal: Positive for nausea and vomiting. Negative for abdominal pain and diarrhea.  Genitourinary: Positive for flank pain and hematuria. Negative  for dysuria.  Musculoskeletal: Negative for neck pain and neck stiffness.  Skin: Negative for rash and wound.  Allergic/Immunologic: Negative for immunocompromised state.  Neurological: Negative for syncope, weakness and headaches.  All other systems reviewed and are negative.    Physical Exam Updated Vital Signs BP (!) 101/47 (BP Location: Left Arm)   Pulse 90   Temp 98.4 F (36.9 C) (Oral)   Resp 16   SpO2 94%   Physical Exam  Constitutional: He is oriented to person, place, and time. He appears well-developed and well-nourished.  Vomiting  HENT:  Head: Normocephalic and atraumatic.  Eyes: Conjunctivae are normal.  Neck: Neck supple.  Cardiovascular: Normal rate, regular rhythm and normal heart sounds.  Exam reveals no friction rub.   No murmur heard. Pulmonary/Chest: Effort normal and breath sounds normal. No respiratory distress. He has no wheezes. He has no rales.  Abdominal: Normal appearance. He exhibits no distension. There is tenderness in the left upper quadrant and left lower quadrant.  Musculoskeletal: He exhibits no edema.  Neurological: He is alert and oriented to person, place, and time. He exhibits normal muscle tone.  Skin: Skin is warm. Capillary refill takes less than 2 seconds.  Psychiatric: He has a normal mood and affect.  Nursing note and vitals reviewed.    ED Treatments / Results  Labs (all labs ordered are listed, but only abnormal results are displayed) Labs Reviewed  URINALYSIS, ROUTINE W REFLEX MICROSCOPIC - Abnormal; Notable for the following:       Result Value   Color, Urine AMBER (*)    APPearance CLOUDY (*)    Hgb urine dipstick LARGE (*)    Protein, ur 100 (*)    All other components within normal limits  COMPREHENSIVE METABOLIC PANEL - Abnormal; Notable for the following:    Glucose, Bld 103 (*)    Creatinine, Ser 1.44 (*)    Albumin 3.4 (*)    GFR calc non Af Amer 44 (*)    GFR calc Af Amer 51 (*)    All other components  within normal limits  CBC  PROTIME-INR  APTT  CREATININE, URINE, RANDOM  SODIUM, URINE, RANDOM  BASIC METABOLIC PANEL  CBC  TYPE AND SCREEN    EKG  EKG Interpretation  None       Radiology Ct Renal Stone Study  Result Date: 05/08/2016 CLINICAL DATA:  Right lower back pain since this morning. EXAM: CT ABDOMEN AND PELVIS WITHOUT CONTRAST TECHNIQUE: Multidetector CT imaging of the abdomen and pelvis was performed following the standard protocol without IV contrast. COMPARISON:  March 16, 2016 FINDINGS: Lower chest: Chronic changes and volume loss seen in the left lower lobe with bronchial wall thickening. There is increased density within the left lower lobe including a nodular component measuring 7 mm on series 6, image 3 not seen in February 2018. Lung bases are otherwise unremarkable. Hepatobiliary: No focal liver abnormality is seen. No gallstones, gallbladder wall thickening, or biliary dilatation. Pancreas: Unremarkable. No pancreatic ductal dilatation or surrounding inflammatory changes. Spleen: Normal in size without focal abnormality. Adrenals/Urinary Tract: The adrenal glands are normal. Cysts associated with the right kidney are again identified. No hydronephrosis or perinephric stranding on the right. There is mild to moderate hydronephrosis on the left which is new. There is perinephric stranding on the left as well which is new. The left ureter is dilated along much of its length. No discrete stone is identified. However, there is relatively higher attenuation in the left ureter just after crosses over the iliac vessels such as on axial image 55. No stones seen in the bladder. The bladder is unremarkable. Stomach/Bowel: The stomach and small bowel are normal. Scattered tiny colonic diverticuli are seen without diverticulitis. The colon and appendix are normal. Vascular/Lymphatic: Dense atherosclerosis is seen in the abdominal aorta. Stents are seen in the left renal artery and iliac  vessels. There is aneurysmal dilatation of the infrarenal abdominal aorta measuring measuring 3.5 cm today versus 3.4 cm previously, not changed given difference in slice selection. No periaortic stranding or fluid. No adenopathy. Reproductive: The prostate is enlarged exerting mass effect on the bladder. This is stable. Other: No other acute changes. Musculoskeletal: No acute or significant osseous findings. IMPRESSION: 1. New hydronephrosis and perinephric stranding on the left. New left ureterectasis. No discrete stone is seen in the left ureter although the attenuation of the ureter after it crosses the iliac vessels is greater than the more proximal ureter. The findings could be seen with a radiolucent stone and hematuria, a recently passed stone with hematuria, hematuria from unknown cause, or an underlying urothelial neoplasm. Recommend clinical correlation and consider urologic consultation. 2. Chronic changes and volume loss in the left lower lobe. There is increased density in the left lower lobe in addition to some nodularity. Since these findings are new since February 2018, I suspect an infectious or inflammatory cause. Recommend follow-up to resolution. 3. Atherosclerosis. 4. Aneurysmal dilatation of the abdominal aorta measuring up to 3.5 cm. Recommend followup by ultrasound in 2 years. This recommendation follows ACR consensus guidelines: White Paper of the ACR Incidental Findings Committee II on Vascular Findings. J Am Coll Radiol 2013; 10:789-794. Electronically Signed   By: Dorise Bullion III M.D   On: 05/08/2016 16:35    Procedures Procedures (including critical care time)  Medications Ordered in ED Medications  ondansetron (ZOFRAN) injection 4 mg (not administered)  nebivolol (BYSTOLIC) tablet 10 mg (not administered)  cholecalciferol (VITAMIN D) tablet 1,000 Units (not administered)  pantoprazole (PROTONIX) EC tablet 40 mg (not administered)  atorvastatin (LIPITOR) tablet 20 mg  (not administered)  tamsulosin (FLOMAX) capsule 0.4 mg (not administered)  finasteride (PROSCAR) tablet 5 mg (not administered)  tiotropium (SPIRIVA) inhalation capsule 18 mcg (18 mcg Inhalation Not Given 05/08/16 1945)  oxyCODONE-acetaminophen (PERCOCET/ROXICET) 5-325 MG per tablet 1 tablet (1 tablet Oral Given 05/08/16 2059)  hydrALAZINE (APRESOLINE) injection 5 mg (not administered)  albuterol (PROVENTIL) (2.5 MG/3ML) 0.083% nebulizer solution 2.5 mg (not administered)  0.9 %  sodium chloride infusion ( Intravenous New Bag/Given 05/08/16 2300)  acetaminophen (TYLENOL) tablet 650 mg (not administered)    Or  acetaminophen (TYLENOL) suppository 650 mg (not administered)  zolpidem (AMBIEN) tablet 5 mg (not administered)  senna-docusate (Senokot-S) tablet 1 tablet (not administered)  amLODipine (NORVASC) tablet 5 mg (not administered)  morphine 2 MG/ML injection 2 mg (2 mg Intravenous Given 05/09/16 0003)  HYDROmorphone (DILAUDID) injection 1 mg (1 mg Intravenous Given 05/08/16 1441)  ondansetron (ZOFRAN) injection 4 mg (4 mg Intravenous Given 05/08/16 1441)  ondansetron (ZOFRAN) injection 4 mg (4 mg Intravenous Given 05/08/16 1524)  sodium chloride 0.9 % bolus 500 mL (0 mLs Intravenous Stopped 05/08/16 1558)  fentaNYL (SUBLIMAZE) injection 75 mcg (75 mcg Intravenous Given 05/08/16 1545)  fentaNYL (SUBLIMAZE) injection 50 mcg (50 mcg Intravenous Given 05/08/16 1754)  fentaNYL (SUBLIMAZE) injection 50 mcg (50 mcg Intravenous Given 05/08/16 1924)     Initial Impression / Assessment and Plan / ED Course  I have reviewed the triage vital signs and the nursing notes.  Pertinent labs & imaging results that were available during my care of the patient were reviewed by me and considered in my medical decision making (see chart for details).     83 yo M here with left flank pain and gross hematuria. UA without signs of infection. BMP does show mild AKI as well. CT scan c/f left hydronephrosis, possible stricture  versus stone versus spasm versus CA. D/w Urology who has evaluated in ED. Will transfer to [facility] for medicine admission, further w/u.  Final Clinical Impressions(s) / ED Diagnoses   Final diagnoses:  Left flank pain  Gross hematuria  Hydronephrosis of left kidney    New Prescriptions Current Discharge Medication List       Duffy Bruce, MD 05/09/16 0021

## 2016-05-09 ENCOUNTER — Encounter (HOSPITAL_COMMUNITY): Payer: Self-pay | Admitting: Anesthesiology

## 2016-05-09 ENCOUNTER — Inpatient Hospital Stay (HOSPITAL_COMMUNITY): Payer: Medicare Other | Admitting: Anesthesiology

## 2016-05-09 ENCOUNTER — Encounter (HOSPITAL_COMMUNITY)
Admission: EM | Disposition: A | Discharge status: Home Health | Payer: Self-pay | Source: Home / Self Care | Attending: Internal Medicine

## 2016-05-09 ENCOUNTER — Inpatient Hospital Stay (HOSPITAL_COMMUNITY): Payer: Medicare Other

## 2016-05-09 DIAGNOSIS — J430 Unilateral pulmonary emphysema [MacLeod's syndrome]: Secondary | ICD-10-CM

## 2016-05-09 DIAGNOSIS — N179 Acute kidney failure, unspecified: Secondary | ICD-10-CM

## 2016-05-09 DIAGNOSIS — N1330 Unspecified hydronephrosis: Secondary | ICD-10-CM

## 2016-05-09 DIAGNOSIS — E7800 Pure hypercholesterolemia, unspecified: Secondary | ICD-10-CM

## 2016-05-09 DIAGNOSIS — I10 Essential (primary) hypertension: Secondary | ICD-10-CM

## 2016-05-09 DIAGNOSIS — N182 Chronic kidney disease, stage 2 (mild): Secondary | ICD-10-CM

## 2016-05-09 HISTORY — PX: CYSTOSCOPY WITH URETEROSCOPY: SHX5123

## 2016-05-09 LAB — BASIC METABOLIC PANEL
Anion gap: 5 (ref 5–15)
BUN: 20 mg/dL (ref 6–20)
CO2: 28 mmol/L (ref 22–32)
CREATININE: 1.44 mg/dL — AB (ref 0.61–1.24)
Calcium: 8.5 mg/dL — ABNORMAL LOW (ref 8.9–10.3)
Chloride: 107 mmol/L (ref 101–111)
GFR calc Af Amer: 51 mL/min — ABNORMAL LOW (ref >60)
GFR calc non Af Amer: 44 mL/min — ABNORMAL LOW (ref >60)
GLUCOSE: 94 mg/dL (ref 65–99)
Potassium: 4.6 mmol/L (ref 3.5–5.1)
Sodium: 140 mmol/L (ref 135–145)

## 2016-05-09 LAB — CBC
HCT: 40.6 % (ref 39.0–52.0)
Hemoglobin: 13.5 g/dL (ref 13.0–17.0)
MCH: 32 pg (ref 26.0–34.0)
MCHC: 33.3 g/dL (ref 30.0–36.0)
MCV: 96.2 fL (ref 78.0–100.0)
Platelets: 149 10*3/uL — ABNORMAL LOW (ref 150–400)
RBC: 4.22 MIL/uL (ref 4.22–5.81)
RDW: 14 % (ref 11.5–15.5)
WBC: 7.4 10*3/uL (ref 4.0–10.5)

## 2016-05-09 LAB — CREATININE, URINE, RANDOM: CREATININE, URINE: 127.67 mg/dL

## 2016-05-09 LAB — PROTIME-INR
INR: 0.99
Prothrombin Time: 13.1 seconds (ref 11.4–15.2)

## 2016-05-09 LAB — ABO/RH: ABO/RH(D): O POS

## 2016-05-09 LAB — SURGICAL PCR SCREEN
MRSA, PCR: NEGATIVE
STAPHYLOCOCCUS AUREUS: POSITIVE — AB

## 2016-05-09 LAB — APTT: aPTT: 35 seconds (ref 24–36)

## 2016-05-09 LAB — SODIUM, URINE, RANDOM: Sodium, Ur: 138 mmol/L

## 2016-05-09 SURGERY — CYSTOSCOPY WITH URETEROSCOPY
Anesthesia: General | Site: Ureter | Laterality: Left

## 2016-05-09 MED ORDER — LIDOCAINE HCL 2 % EX GEL
CUTANEOUS | Status: DC | PRN
  Administered 2016-05-09: 1 via URETHRAL

## 2016-05-09 MED ORDER — PROPOFOL 10 MG/ML IV BOLUS
INTRAVENOUS | Status: DC | PRN
  Administered 2016-05-09: 150 mg via INTRAVENOUS

## 2016-05-09 MED ORDER — LIDOCAINE 2% (20 MG/ML) 5 ML SYRINGE
INTRAMUSCULAR | Status: AC
  Filled 2016-05-09: qty 5

## 2016-05-09 MED ORDER — LIDOCAINE 2% (20 MG/ML) 5 ML SYRINGE
INTRAMUSCULAR | Status: DC | PRN
Start: 2016-05-09 — End: 2016-05-09
  Administered 2016-05-09: 10 mg via INTRAVENOUS

## 2016-05-09 MED ORDER — PROPOFOL 10 MG/ML IV BOLUS
INTRAVENOUS | Status: AC
Start: 2016-05-09 — End: 2016-05-09
  Filled 2016-05-09: qty 20

## 2016-05-09 MED ORDER — FENTANYL CITRATE (PF) 100 MCG/2ML IJ SOLN
INTRAMUSCULAR | Status: DC | PRN
  Administered 2016-05-09 (×2): 50 ug via INTRAVENOUS

## 2016-05-09 MED ORDER — SODIUM CHLORIDE 0.9 % IR SOLN
Status: DC | PRN
  Administered 2016-05-09: 1000 mL
  Administered 2016-05-09: 3000 mL

## 2016-05-09 MED ORDER — ONDANSETRON HCL 4 MG/2ML IJ SOLN
INTRAMUSCULAR | Status: DC | PRN
  Administered 2016-05-09: 4 mg via INTRAVENOUS

## 2016-05-09 MED ORDER — FENTANYL CITRATE (PF) 100 MCG/2ML IJ SOLN: 25.0000 ug | INTRAMUSCULAR | Status: DC | PRN

## 2016-05-09 MED ORDER — EPHEDRINE SULFATE-NACL 50-0.9 MG/10ML-% IV SOSY
PREFILLED_SYRINGE | INTRAVENOUS | Status: DC | PRN
  Administered 2016-05-09 (×2): 10 mg via INTRAVENOUS

## 2016-05-09 MED ORDER — OXYCODONE HCL 5 MG PO TABS: 5.0000 mg | ORAL_TABLET | Freq: Once | ORAL | Status: DC | PRN

## 2016-05-09 MED ORDER — LIDOCAINE HCL 2 % EX GEL
CUTANEOUS | Status: AC
  Filled 2016-05-09: qty 5

## 2016-05-09 MED ORDER — CEFAZOLIN SODIUM-DEXTROSE 2-4 GM/100ML-% IV SOLN
INTRAVENOUS | Status: AC
  Filled 2016-05-09: qty 100

## 2016-05-09 MED ORDER — DEXAMETHASONE SODIUM PHOSPHATE 10 MG/ML IJ SOLN
INTRAMUSCULAR | Status: DC | PRN
  Administered 2016-05-09: 10 mg via INTRAVENOUS

## 2016-05-09 MED ORDER — 0.9 % SODIUM CHLORIDE (POUR BTL) OPTIME
TOPICAL | Status: DC | PRN
  Administered 2016-05-09: 1000 mL

## 2016-05-09 MED ORDER — MUPIROCIN 2 % EX OINT
1.0000 "application " | TOPICAL_OINTMENT | Freq: Two times a day (BID) | CUTANEOUS | Status: DC
  Administered 2016-05-09 – 2016-05-10 (×3): 1 via NASAL
  Filled 2016-05-09: qty 22

## 2016-05-09 MED ORDER — CHLORHEXIDINE GLUCONATE CLOTH 2 % EX PADS
6.0000 | MEDICATED_PAD | Freq: Every day | CUTANEOUS | Status: DC
  Administered 2016-05-09 – 2016-05-10 (×2): 6 via TOPICAL

## 2016-05-09 MED ORDER — CEFAZOLIN SODIUM-DEXTROSE 2-4 GM/100ML-% IV SOLN
2.0000 g | INTRAVENOUS | Status: AC
  Administered 2016-05-09: 2 g via INTRAVENOUS
  Filled 2016-05-09: qty 100

## 2016-05-09 MED ORDER — FENTANYL CITRATE (PF) 100 MCG/2ML IJ SOLN
INTRAMUSCULAR | Status: AC
  Filled 2016-05-09: qty 2

## 2016-05-09 MED ORDER — ONDANSETRON HCL 4 MG/2ML IJ SOLN: 4.0000 mg | Freq: Four times a day (QID) | INTRAMUSCULAR | Status: DC | PRN

## 2016-05-09 MED ORDER — OXYCODONE HCL 5 MG/5ML PO SOLN
5.0000 mg | Freq: Once | ORAL | Status: DC | PRN
  Filled 2016-05-09: qty 5

## 2016-05-09 MED ORDER — SODIUM CHLORIDE 0.9 % IV SOLN
INTRAVENOUS | Status: DC | PRN
  Administered 2016-05-09: 5 mL

## 2016-05-09 SURGICAL SUPPLY — 26 items
BAG URO CATCHER STRL LF (MISCELLANEOUS) ×3 IMPLANT
BASKET STONE NCOMPASS (UROLOGICAL SUPPLIES) IMPLANT
CATH FOLEY 2WAY 5CC 20FR (CATHETERS) ×2 IMPLANT
CATH URET 5FR 28IN OPEN ENDED (CATHETERS) IMPLANT
CATH URET DUAL LUMEN 6-10FR 50 (CATHETERS) ×3 IMPLANT
CLOTH BEACON ORANGE TIMEOUT ST (SAFETY) ×3 IMPLANT
COVER SURGICAL LIGHT HANDLE (MISCELLANEOUS) ×3 IMPLANT
EXTRACTOR STONE NITINOL NGAGE (UROLOGICAL SUPPLIES) ×5 IMPLANT
FIBER LASER FLEXIVA 1000 (UROLOGICAL SUPPLIES) IMPLANT
FIBER LASER FLEXIVA 365 (UROLOGICAL SUPPLIES) ×2 IMPLANT
FIBER LASER FLEXIVA 550 (UROLOGICAL SUPPLIES) IMPLANT
GLOVE SURG SS PI 8.0 STRL IVOR (GLOVE) IMPLANT
GOWN STRL REUS W/TWL XL LVL3 (GOWN DISPOSABLE) ×3 IMPLANT
GUIDEWIRE ANG ZIPWIRE 038X150 (WIRE) ×2 IMPLANT
GUIDEWIRE STR DUAL SENSOR (WIRE) ×3 IMPLANT
IV NS 1000ML (IV SOLUTION) ×3
IV NS 1000ML BAXH (IV SOLUTION) ×1 IMPLANT
IV NS IRRIG 3000ML ARTHROMATIC (IV SOLUTION) ×3 IMPLANT
MANIFOLD NEPTUNE II (INSTRUMENTS) ×3 IMPLANT
PACK CYSTO (CUSTOM PROCEDURE TRAY) ×3 IMPLANT
SHEATH ACCESS URETERAL 38CM (SHEATH) ×5 IMPLANT
SHEATH URET ACCESS 10/12FR (MISCELLANEOUS) IMPLANT
STENT URET 6FRX26 CONTOUR (STENTS) ×2 IMPLANT
SYRINGE IRR TOOMEY STRL 70CC (SYRINGE) ×2 IMPLANT
TUBING CONNECTING 10 (TUBING) ×2 IMPLANT
TUBING CONNECTING 10' (TUBING) ×1

## 2016-05-09 NOTE — Progress Notes (Signed)
Patient came back from PACU, awake , alert and oriented,patient was tearful and was comforted by family, wife, son and grandaughter was at bedside.Foley draining cherry colored urine.

## 2016-05-09 NOTE — Transfer of Care (Signed)
Immediate Anesthesia Transfer of Care Note  Patient: Douglas Monroe  Procedure(s) Performed: Procedure(s): CYSTOSCOPY, RETROGRADE, LEFT URETEROSCOPY, LEFT URETERAL BIOPSIES, HOLMIUM LASER FULGERATION, LEFT URETERAL STENT PLACEMENT (Left)  Patient Location: PACU  Anesthesia Type:General  Level of Consciousness: sedated  Airway & Oxygen Therapy: Patient Spontanous Breathing and Patient connected to face mask oxygen  Post-op Assessment: Report given to RN and Post -op Vital signs reviewed and stable  Post vital signs: Reviewed and stable  Last Vitals:  Vitals:   05/09/16 1400 05/09/16 1551  BP: 131/66 126/76  Pulse: 66 93  Resp: 16 16  Temp: 37.2 C 37.4 C    Last Pain:  Vitals:   05/09/16 1551  TempSrc: Oral  PainSc:       Patients Stated Pain Goal: 1 (22/57/50 5183)  Complications: No apparent anesthesia complications

## 2016-05-09 NOTE — Op Note (Signed)
Preoperative Diagnosis: Left hydronephrosis, pain  Postoperative Diagnosis:  Left mid ureteral tumor  Procedure(s) Performed:   1. Cystourethroscopy 2. Left retrograde pyelogram 3. Left ureteral stent placement, 6Fr x 26cm JJ ureteral stent without dangler 4. Left ureteral biopsy with holmium laser fulguration 5. Intraoperative fluoroscopy with interpretation <1hr. 6. Simple Foley catheter placement   Teaching Surgeon:  Irine Seal, MD  Resident Surgeon:  Sharmaine Base, MD  Assistant(s):  None  Anesthesia:  General   Fluids:  See anesthesia record  Estimated blood loss:  25 mL  Specimens:  Left mid ureteral tumor  Cultures:  None  Drains:  LEFT 6Fr x 26cm JJ ureteral stent without dangler; 20Fr 2-way coude catheter  Complications:  None  Indications: 83 y.o. patient with a significant history of COPD and peripheral vascular disease, nephrolithiasis, microhematuria, smoking history who presented with left flank pain, hydronephrosis with hydroureter tracking to the left mid ureter with high density lesion noted suggestive of blood clot vs lesion, not suggestive of stone presence. He was admitted to the hospitalist service for pain management. After discussion the patient has agreed to pursue cystoscopy with left ureteral stent placement & RPG, possible upper tract ureteroscopy & biopsy. Risks & benefits of the procedure discussed with the patient, who wishes to proceed.  Findings:  Very high riding bladder neck. Moderate to severe BPH. Bladder wall trabeculations. Small bladder dome diverticulum. No bladder tumors, stones noted. Blood clot adherent to left ureteral orifice with friable yellow tissue within it. Left mid ureteral tumor towards the proximal aspect of the distal left ureter about halfway between the pelvic brim and sacroiliac joint. This appeared papillary, frondular with adherent blood clot and a vascular stalk. Several satellite lesions that were superficial, papillary  and farther proximal were also noted. We did not perform proximal ureteroscopy or pyeloscopy. Bleeding vessel from the stalk fulgurated with the holmium laser. Successful stent placement. Successful Foley catheter placement after evacuation of bladder clot and urethral clot from our procedure.  Radiologic Interpretation of Retrograde Pyelogram: Left retrograde pyelogram demonstrated what appeared to be too formal filling defects in the proximal aspect of the distal left ureter approximately in the middle of the sacroiliac joint as well as one other filling defect about 1-2 cm proximal to that. There was no contrast extravasation. There was J hooking of the distal ureter at the UVJ. There were no filling defects in the kidney or proximal ureter appreciated. There was no significant hydroureteronephrosis.  Description:  The patient was correctly identified in the preop holding area where written informed consent as well potential risk and complication reviewed. The patient agreed. They were brought back to the operative suite. Once correct information was verified, general anesthesia was induced. They were then gently placed into dorsal lithotomy position with SCDs in place for VTE prophylaxis. They were prepped and draped in the usual sterile fashion and given appropriate preoperative antibiotics with Ancef. A second timeout was then performed.   We inserted a 58F rigid cystoscope per urethra with copious lubrication and normal saline irrigation running. This demonstrated findings as described above.    We turned our attention to the left ureteral orifice and grasped the blood clot with adherent tissue at the left ureteral orifice with the flexible graspers and removed this and saved it for specimen.  We cannulated the left ureteral orifice with a 5 [place] open-ended catheter into the very distal aspect of the left ureter and performed a retrograde pyelogram with findings as noted above. We attempted  passage of a sensor wire up the ureter but had trouble doing so due to the significant J hooking of the left distal ureter. We therefore used an angled Glidewire which was successfully passed up into the left renal pelvis under direct visual and fluoroscopic guidance. The 5 [place] open-ended catheter was then advanced approximately 25 cm up the left ureter and the angle Glidewire was removed. We then replaced the Glidewire with a sensor wire and removed the 5 [place] open-ended catheter.  We then removed the cystoscope leaving the sensor wire in place. A semirigid ureteroscope was advanced adjacent to the sensor wire and mid and distal left ureteroscopy was performed with findings as noted above. A nitinol basket was used to remove tumor and blood clot burden. The most distal tumor burden at the site noted above approximately midway at this SI joint had a vascular stalk which was oozing some during our treatment. I made the decision to fulgurate this with the holmium laser. Using a 325  holmium laser fiber we fulgurated the vascular stalk of this lesion and then removed the remaining tumor burden with a nitinol basket. Repeat semirigid ureteroscopy at this site showed adequate hemostasis. We did note for further superficial, satellite lesions proximal to this site indicating multifocal disease. At this point, with tissue procurement for specimen, we made the decision to proceed with ureteral stent placement. We removed the semirigid ureteroscope leaving our sensor wire in place.  We then backloaded the sensor wire into the 22 French rigid cystoscope. We then advanced a 6Fr x 26cm JJ ureteral stent without dangler with the assistance of a stent pusher under direct fluoroscopic & visual guidance without difficultly. Sensor wire removal demonstrated satisfactory stent curl proximally in the renal pelvis and distally in the bladder. The bladder was emptied and all instrumentation was removed. 2% viscous lidocaine  Urojet was instilled in the bladder after all clot had been removed. A 20 French 2 way Foley catheter with a coud tip was then advanced into the urethra. 10 mL of sterile water was placed in the catheter balloon. The patient was woken up from anesthesia and taken to the recovery unit for routine postoperative care.   Post Op Plan:   1. Continue Foley catheter postoperatively 2. Continue Flomax & finasteride 3. Hold plavix if possible, however defer to medical team 4. Will hold off on ordering anticholinergics due to age but if bladder spasms are a major concern consider belladonna-opiate suppositories PRN 5. Follow up pathology. Possible discharge 4/9  Attestation:  Dr. Jeffie Pollock was present and scrubbed for the entirety of the procedure.    Sharmaine Base, MD Resident, Department of Urology

## 2016-05-09 NOTE — Anesthesia Preprocedure Evaluation (Addendum)
Anesthesia Evaluation  Patient identified by MRN, date of birth, ID band Patient awake    Reviewed: Allergy & Precautions, NPO status , Patient's Chart, lab work & pertinent test results  Airway Mallampati: I  TM Distance: >3 FB Neck ROM: Full    Dental  (+) Edentulous Upper, Edentulous Lower   Pulmonary sleep apnea , COPD,  COPD inhaler, former smoker,    breath sounds clear to auscultation       Cardiovascular hypertension, Pt. on medications and Pt. on home beta blockers + Peripheral Vascular Disease  + dysrhythmias  Rhythm:Regular Rate:Normal     Neuro/Psych  Headaches, Anxiety    GI/Hepatic Neg liver ROS, PUD, GERD  Medicated,  Endo/Other  negative endocrine ROS  Renal/GU Renal disease  negative genitourinary   Musculoskeletal  (+) Arthritis , Osteoarthritis,    Abdominal   Peds negative pediatric ROS (+)  Hematology negative hematology ROS (+)   Anesthesia Other Findings   Reproductive/Obstetrics negative OB ROS                            Anesthesia Physical Anesthesia Plan  ASA: III and emergent  Anesthesia Plan: General   Post-op Pain Management:    Induction: Intravenous  Airway Management Planned: LMA  Additional Equipment:   Intra-op Plan:   Post-operative Plan: Extubation in OR  Informed Consent: I have reviewed the patients History and Physical, chart, labs and discussed the procedure including the risks, benefits and alternatives for the proposed anesthesia with the patient or authorized representative who has indicated his/her understanding and acceptance.   Dental advisory given  Plan Discussed with: CRNA  Anesthesia Plan Comments:         Anesthesia Quick Evaluation

## 2016-05-09 NOTE — Progress Notes (Signed)
PROGRESS NOTE  Douglas Monroe LFY:[id number] DOB: [birth year] DOA: 05/08/2016 PCP: [name], MD   LOS: 1 day   Brief Narrative: Douglas Monroe is a 83 y.o. male with medical history significant of infrarenal AAA, anxiety, OA, COPD with emphysema, GERD, nephrolithiasis, HTN, HLD, ischemic colitis, significant PAD &PVD (moderate left carotid artery stenosis, left renal artery stent, bilateral iliac stent, right SFA stent), OSA, who present with left flank pain or hematuria.  He was found to have new hydronephrosis and perinephric stranding on the left, urology were consulted and plan to take patient to the operating room on 4/8  Assessment & Plan: Principal Problem:   Hydronephrosis Active Problems:   PVD S/P multiple PCIs   COPD (chronic obstructive pulmonary disease) ([place])   HTN (hypertension)   Abdominal aortic aneurysm ([facility])   Hyperlipidemia   Acute renal failure superimposed on stage 2 chronic kidney disease ([place])   Left Hydronephrosis  -CT scan showed new hydronephrosis and perinephric stranding on the left. New left ureterectasis. No discrete stone is seen in the left ureter.  -Appreciate urology consultation, will be taken to the OR today -UA without evidence of infection, patient afebrile and he has no leukocytosis  PVD S/P multiple PCIs -continue lipitor -hold plavix  Recent ischemic colitis -Reports no bleeding for the past 6 weeks  COPD -stable -When necessary albuterol nebulizer -spiriva inhaler  HTN -hold Diovan due to worsening renal function -amlodipine 5 daily -continue Bystolic -IVF hydralazine when necessary  Abdominal aortic aneurysm ([place])  -Chart review showed that AAA size was 4.0-4.1 cm. Today CT scan showed 3.5 cm in size. -f/u with PCP  Hyperlipidemia -lipitor  Acute renal failure superimposed on stage 2 chronic kidney disease ([place]) -Baseline creatinine 1.0-1.2. His creatinine 1.44, likely postobstructive, expect improved following  decompression  Chronic changes and volume loss in the left lower lobe -This is an incidental finding from CT scan. There is also increased density in the left lower lobe in addition to some nodularity. Since these findings are new since February 2018, radiologist suspects an infectious or inflammatory cause. Recommend follow-up to resolution. -f/u with PCP  BPH -stable -Continue Flomax and proscar  DVT prophylaxis: SCD Code Status: Full code Family Communication: family bedside Disposition Plan: home when ready  Consultants:   Urology  Procedures:   None   Antimicrobials:  None    Subjective: - no chest pain, shortness of breath, no abdominal pain, nausea or vomiting.    Objective: Vitals:   05/08/16 2138 05/08/16 2145 05/09/16 0524 05/09/16 0935  BP: (!) 101/47  110/64 117/70  Pulse: 90  65 68  Resp: 16  16   Temp: 98.4 F (36.9 C)  98.1 F (36.7 C)   TempSrc: Oral  Oral   SpO2: 94%  98%   Weight:  69.9 kg (154 lb 1.6 oz)    Height:  5\' 6"  (1.676 m)      Intake/Output Summary (Last 24 hours) at 05/09/16 1043 Last data filed at 05/09/16 0525  Gross per 24 hour  Intake           981.25 ml  Output              550 ml  Net           431.25 ml   Filed Weights   05/08/16 2145  Weight: 69.9 kg (154 lb 1.6 oz)    Examination: Constitutional: NAD Vitals:   05/08/16 2138 05/08/16 2145 05/09/16 0524 05/09/16 0935  BP: (!) 101/47  110/64 117/70  Pulse: 90  65 68  Resp: 16  16   Temp: 98.4 F (36.9 C)  98.1 F (36.7 C)   TempSrc: Oral  Oral   SpO2: 94%  98%   Weight:  69.9 kg (154 lb 1.6 oz)    Height:  5\' 6"  (1.676 m)     Eyes: PERRL, lids and conjunctivae normal ENMT: Mucous membranes are moist. Neck: normal, supple, no masses, no thyromegaly Respiratory: clear to auscultation bilaterally, no wheezing, no crackles.  Cardiovascular: Regular rate and rhythm, no murmurs / rubs / gallops. No LE edema. 2+ pedal pulses. Abdomen: no tenderness. Bowel  sounds positive.  Musculoskeletal: no clubbing / cyanosis.     Data Reviewed: I have personally reviewed following labs and imaging studies  CBC:  Recent Labs Lab 05/08/16 1438 05/09/16 0819  WBC 8.9 7.4  HGB 14.6 13.5  HCT 44.8 40.6  MCV 94.1 96.2  PLT 181 381*   Basic Metabolic Panel:  Recent Labs Lab 05/08/16 1438 05/09/16 0819  NA 138 140  K 4.6 4.6  CL 102 107  CO2 25 28  GLUCOSE 103* 94  BUN 19 20  CREATININE 1.44* 1.44*  CALCIUM 9.2 8.5*   GFR: Estimated Creatinine Clearance: 36.3 mL/min (A) (by C-G formula based on SCr of 1.44 mg/dL (H)). Liver Function Tests:  Recent Labs Lab 05/08/16 1438  AST 25  ALT 21  ALKPHOS 111  BILITOT 0.8  PROT 7.3  ALBUMIN 3.4*   No results for input(s): LIPASE, AMYLASE in the last 168 hours. No results for input(s): AMMONIA in the last 168 hours. Coagulation Profile:  Recent Labs Lab 05/09/16 0819  INR 0.99   Cardiac Enzymes: No results for input(s): CKTOTAL, CKMB, CKMBINDEX, TROPONINI in the last 168 hours. BNP (last 3 results) No results for input(s): PROBNP in the last 8760 hours. HbA1C: No results for input(s): HGBA1C in the last 72 hours. CBG: No results for input(s): GLUCAP in the last 168 hours. Lipid Profile: No results for input(s): CHOL, HDL, LDLCALC, TRIG, CHOLHDL, LDLDIRECT in the last 72 hours. Thyroid Function Tests: No results for input(s): TSH, T4TOTAL, FREET4, T3FREE, THYROIDAB in the last 72 hours. Anemia Panel: No results for input(s): VITAMINB12, FOLATE, FERRITIN, TIBC, IRON, RETICCTPCT in the last 72 hours. Urine analysis:    Component Value Date/Time   COLORURINE AMBER (A) 05/08/2016 1432   APPEARANCEUR CLOUDY (A) 05/08/2016 1432   LABSPEC 1.016 05/08/2016 1432   PHURINE 5.0 05/08/2016 1432   GLUCOSEU NEGATIVE 05/08/2016 1432   HGBUR LARGE (A) 05/08/2016 1432   BILIRUBINUR NEGATIVE 05/08/2016 1432   KETONESUR NEGATIVE 05/08/2016 1432   PROTEINUR 100 (A) 05/08/2016 1432    UROBILINOGEN 0.2 04/24/2013 0238   NITRITE NEGATIVE 05/08/2016 1432   LEUKOCYTESUR NEGATIVE 05/08/2016 1432   Sepsis Labs: Invalid input(s): PROCALCITONIN, LACTICIDVEN  Recent Results (from the past 240 hour(s))  Surgical PCR screen     Status: Abnormal   Collection Time: 05/09/16  5:36 AM  Result Value Ref Range Status   MRSA, PCR NEGATIVE NEGATIVE Final   Staphylococcus aureus POSITIVE (A) NEGATIVE Final    Comment:        The Xpert SA Assay (FDA approved for NASAL specimens in patients over 27 years of age), is one component of a comprehensive surveillance program.  Test performance has been validated by [facility] for patients greater than or equal to 66 year old. It is not intended to diagnose infection nor to guide  or monitor treatment.    Radiology Studies: Ct Renal Stone Study  Result Date: 05/08/2016 CLINICAL DATA:  Right lower back pain since this morning. EXAM: CT ABDOMEN AND PELVIS WITHOUT CONTRAST TECHNIQUE: Multidetector CT imaging of the abdomen and pelvis was performed following the standard protocol without IV contrast. COMPARISON:  March 16, 2016 FINDINGS: Lower chest: Chronic changes and volume loss seen in the left lower lobe with bronchial wall thickening. There is increased density within the left lower lobe including a nodular component measuring 7 mm on series 6, image 3 not seen in February 2018. Lung bases are otherwise unremarkable. Hepatobiliary: No focal liver abnormality is seen. No gallstones, gallbladder wall thickening, or biliary dilatation. Pancreas: Unremarkable. No pancreatic ductal dilatation or surrounding inflammatory changes. Spleen: Normal in size without focal abnormality. Adrenals/Urinary Tract: The adrenal glands are normal. Cysts associated with the right kidney are again identified. No hydronephrosis or perinephric stranding on the right. There is mild to moderate hydronephrosis on the left which is new. There is perinephric stranding on  the left as well which is new. The left ureter is dilated along much of its length. No discrete stone is identified. However, there is relatively higher attenuation in the left ureter just after crosses over the iliac vessels such as on axial image 55. No stones seen in the bladder. The bladder is unremarkable. Stomach/Bowel: The stomach and small bowel are normal. Scattered tiny colonic diverticuli are seen without diverticulitis. The colon and appendix are normal. Vascular/Lymphatic: Dense atherosclerosis is seen in the abdominal aorta. Stents are seen in the left renal artery and iliac vessels. There is aneurysmal dilatation of the infrarenal abdominal aorta measuring measuring 3.5 cm today versus 3.4 cm previously, not changed given difference in slice selection. No periaortic stranding or fluid. No adenopathy. Reproductive: The prostate is enlarged exerting mass effect on the bladder. This is stable. Other: No other acute changes. Musculoskeletal: No acute or significant osseous findings. IMPRESSION: 1. New hydronephrosis and perinephric stranding on the left. New left ureterectasis. No discrete stone is seen in the left ureter although the attenuation of the ureter after it crosses the iliac vessels is greater than the more proximal ureter. The findings could be seen with a radiolucent stone and hematuria, a recently passed stone with hematuria, hematuria from unknown cause, or an underlying urothelial neoplasm. Recommend clinical correlation and consider urologic consultation. 2. Chronic changes and volume loss in the left lower lobe. There is increased density in the left lower lobe in addition to some nodularity. Since these findings are new since February 2018, I suspect an infectious or inflammatory cause. Recommend follow-up to resolution. 3. Atherosclerosis. 4. Aneurysmal dilatation of the abdominal aorta measuring up to 3.5 cm. Recommend followup by ultrasound in 2 years. This recommendation follows  ACR consensus guidelines: White Paper of the ACR Incidental Findings Committee II on Vascular Findings. J Am Coll Radiol 2013; 10:789-794. Electronically Signed   By: Dorise Bullion III M.D   On: 05/08/2016 16:35   Scheduled Meds: . amLODipine  5 mg Oral QPM  . atorvastatin  20 mg Oral q1800  .  ceFAZolin (ANCEF) IV  2 g Intravenous On Call to OR  . Chlorhexidine Gluconate Cloth  6 each Topical Daily  . cholecalciferol  1,000 Units Oral Daily  . finasteride  5 mg Oral q morning - 10a  . mupirocin ointment  1 application Nasal BID  . nebivolol  10 mg Oral Daily  . pantoprazole  40 mg Oral Daily  .  tamsulosin  0.4 mg Oral Daily  . tiotropium  18 mcg Inhalation Daily   Continuous Infusions: . sodium chloride 75 mL/hr at 05/09/16 7425    Marzetta Board, MD, PhD [facility] Pager [phone]  If 7PM-7AM, please contact night-coverage [email or web address] Password [facility] 05/09/2016, 10:43 AM

## 2016-05-09 NOTE — Anesthesia Procedure Notes (Signed)
Procedure Name: LMA Insertion Date/Time: 05/09/2016 2:38 PM Performed by: Lind Covert Pre-anesthesia Checklist: Patient identified, Emergency Drugs available, Suction available, Patient being monitored and Timeout performed Patient Re-evaluated:Patient Re-evaluated prior to inductionOxygen Delivery Method: Circle system utilized Preoxygenation: Pre-oxygenation with 100% oxygen Intubation Type: IV induction LMA: LMA inserted LMA Size: 4.0 Number of attempts: 1 Placement Confirmation: positive ETCO2 and breath sounds checked- equal and bilateral Tube secured with: Tape Dental Injury: Teeth and Oropharynx as per pre-operative assessment

## 2016-05-09 NOTE — Consult Note (Signed)
Urology Consult Note   Requesting Attending Physician:  Caren Griffins, MD Service Providing Consult: Urology Consulting Attending: Jeffie Pollock   Assessment:  Patient is a 83 y.o. male with infrarenal AAA, anxiety, OA, COPD with emphysema, nephrolithiasis, HTN, HLD, ischemic colitis, significant PAD & PVD (moderate left carotid artery stenosis, left renal artery stent, bilateral iliac stent, right SFA stent) OSA. He has known history of microhematuria and presented 4/7 with a 1-2 day history of gross hematuria with left flank pain. He does not have concern for infection. His hydroureteronephrosis appears to track down to the level of the mid left ureter with high density tissue at this spot consistent with blood clot or possible malignant process. Alternatively he could have just passed a left-sided ureteral calculus although would expect his hydroureteronephrosis to track down to the level of the bladder. He was admitted for pain control.  Interval: Afebrile. Tachy to 105 x 1, but otherwise VSS. 550cc UOP, spontaneously voiding. WBC 7. Cr 1.44. On schedule for OR at 1pm.  Recommendations: -- OR today for cystoscopy, left ureteral stent placement + RPG, possible left ureteroscopy with upper tract biopsy/fulguration -- Ancef 1g IV OCTOR -- Hold plavix -- Keep NPO  Thank you for this consult. Please contact the urology consult pager with any further questions/concerns. Sharmaine Base, MD Urology Surgical Resident   Subjective Pain is much better with IV pain meds. No n/v. We discussed plan for OR again today with family and patient.  Objective   Vital signs in last 24 hours: BP 117/70   Pulse 68   Temp 98.1 F (36.7 C) (Oral)   Resp 16   Ht 5\' 6"  (1.676 m)   Wt 69.9 kg (154 lb 1.6 oz)   SpO2 98%   BMI 24.87 kg/m   Intake/Output last 3 shifts: I/O last 3 completed shifts: In: 981.3 [I.V.:481.3; IV Piggyback:500] Out: 1150 [Urine:1150]  Physical Exam General: NAD, A&O, resting,  appropriate HEENT: [place]/AT, EOMI, MMM Pulmonary: Normal work of breathing on RA Cardiovascular: Regular rate & rhythm, HDS, adequate peripheral perfusion Abdomen: soft, TTP in left flank, mildly distended, no suprapubic fullness or tenderness GU: voiding spontaneously, no appreciable CVA tenderness DRE: deferred Extremities: warm and well perfused, no edema  Most Recent Labs: Lab Results  Component Value Date   WBC 7.4 05/09/2016   HGB 13.5 05/09/2016   HCT 40.6 05/09/2016   PLT 149 (L) 05/09/2016    Lab Results  Component Value Date   NA 140 05/09/2016   K 4.6 05/09/2016   CL 107 05/09/2016   CO2 28 05/09/2016   BUN 20 05/09/2016   CREATININE 1.44 (H) 05/09/2016   CALCIUM 8.5 (L) 05/09/2016    Lab Results  Component Value Date   ALKPHOS 111 05/08/2016   BILITOT 0.8 05/08/2016   BILIDIR 0.1 03/06/2013   PROT 7.3 05/08/2016   ALBUMIN 3.4 (L) 05/08/2016   ALT 21 05/08/2016   AST 25 05/08/2016    Lab Results  Component Value Date   INR 0.99 05/09/2016   APTT 35 05/09/2016     Urine Culture: None   IMAGING: Ct Renal Stone Study  Result Date: 05/08/2016 CLINICAL DATA:  Right lower back pain since this morning. EXAM: CT ABDOMEN AND PELVIS WITHOUT CONTRAST TECHNIQUE: Multidetector CT imaging of the abdomen and pelvis was performed following the standard protocol without IV contrast. COMPARISON:  March 16, 2016 FINDINGS: Lower chest: Chronic changes and volume loss seen in the left lower lobe with bronchial wall thickening. There  is increased density within the left lower lobe including a nodular component measuring 7 mm on series 6, image 3 not seen in February 2018. Lung bases are otherwise unremarkable. Hepatobiliary: No focal liver abnormality is seen. No gallstones, gallbladder wall thickening, or biliary dilatation. Pancreas: Unremarkable. No pancreatic ductal dilatation or surrounding inflammatory changes. Spleen: Normal in size without focal abnormality.  Adrenals/Urinary Tract: The adrenal glands are normal. Cysts associated with the right kidney are again identified. No hydronephrosis or perinephric stranding on the right. There is mild to moderate hydronephrosis on the left which is new. There is perinephric stranding on the left as well which is new. The left ureter is dilated along much of its length. No discrete stone is identified. However, there is relatively higher attenuation in the left ureter just after crosses over the iliac vessels such as on axial image 55. No stones seen in the bladder. The bladder is unremarkable. Stomach/Bowel: The stomach and small bowel are normal. Scattered tiny colonic diverticuli are seen without diverticulitis. The colon and appendix are normal. Vascular/Lymphatic: Dense atherosclerosis is seen in the abdominal aorta. Stents are seen in the left renal artery and iliac vessels. There is aneurysmal dilatation of the infrarenal abdominal aorta measuring measuring 3.5 cm today versus 3.4 cm previously, not changed given difference in slice selection. No periaortic stranding or fluid. No adenopathy. Reproductive: The prostate is enlarged exerting mass effect on the bladder. This is stable. Other: No other acute changes. Musculoskeletal: No acute or significant osseous findings. IMPRESSION: 1. New hydronephrosis and perinephric stranding on the left. New left ureterectasis. No discrete stone is seen in the left ureter although the attenuation of the ureter after it crosses the iliac vessels is greater than the more proximal ureter. The findings could be seen with a radiolucent stone and hematuria, a recently passed stone with hematuria, hematuria from unknown cause, or an underlying urothelial neoplasm. Recommend clinical correlation and consider urologic consultation. 2. Chronic changes and volume loss in the left lower lobe. There is increased density in the left lower lobe in addition to some nodularity. Since these findings are  new since February 2018, I suspect an infectious or inflammatory cause. Recommend follow-up to resolution. 3. Atherosclerosis. 4. Aneurysmal dilatation of the abdominal aorta measuring up to 3.5 cm. Recommend followup by ultrasound in 2 years. This recommendation follows ACR consensus guidelines: White Paper of the ACR Incidental Findings Committee II on Vascular Findings. J Am Coll Radiol 2013; 10:789-794. Electronically Signed   By: Dorise Bullion III M.D   On: 05/08/2016 16:35

## 2016-05-09 NOTE — Anesthesia Postprocedure Evaluation (Addendum)
Anesthesia Post Note  Patient: Douglas Monroe  Procedure(s) Performed: Procedure(s) (LRB): CYSTOSCOPY, RETROGRADE, LEFT URETEROSCOPY, LEFT URETERAL BIOPSIES, HOLMIUM LASER FULGERATION, LEFT URETERAL STENT PLACEMENT (Left)  Patient location during evaluation: PACU Anesthesia Type: General Level of consciousness: awake and alert Pain management: pain level controlled Vital Signs Assessment: post-procedure vital signs reviewed and stable Respiratory status: spontaneous breathing, nonlabored ventilation, respiratory function stable and patient connected to nasal cannula oxygen Cardiovascular status: blood pressure returned to baseline and stable Postop Assessment: no signs of nausea or vomiting Anesthetic complications: no       Last Vitals:  Vitals:   05/09/16 1630 05/09/16 1645  BP: (!) 122/57 134/74  Pulse: 66 77  Resp: 18 16  Temp: 36.4 C 36.5 C    Last Pain:  Vitals:   05/09/16 1615  TempSrc:   PainSc: 0-No pain                 Effie Berkshire

## 2016-05-10 ENCOUNTER — Encounter (HOSPITAL_COMMUNITY): Payer: Self-pay | Admitting: Urology

## 2016-05-10 LAB — TYPE AND SCREEN
ABO/RH(D): O POS
Antibody Screen: NEGATIVE

## 2016-05-10 LAB — CBC
HCT: 38.9 % — ABNORMAL LOW (ref 39.0–52.0)
Hemoglobin: 12.7 g/dL — ABNORMAL LOW (ref 13.0–17.0)
MCH: 30.8 pg (ref 26.0–34.0)
MCHC: 32.6 g/dL (ref 30.0–36.0)
MCV: 94.2 fL (ref 78.0–100.0)
PLATELETS: 157 10*3/uL (ref 150–400)
RBC: 4.13 MIL/uL — ABNORMAL LOW (ref 4.22–5.81)
RDW: 13.6 % (ref 11.5–15.5)
WBC: 8.3 10*3/uL (ref 4.0–10.5)

## 2016-05-10 LAB — BASIC METABOLIC PANEL
Anion gap: 10 (ref 5–15)
BUN: 16 mg/dL (ref 6–20)
CHLORIDE: 105 mmol/L (ref 101–111)
CO2: 24 mmol/L (ref 22–32)
CREATININE: 1.25 mg/dL — AB (ref 0.61–1.24)
Calcium: 8.5 mg/dL — ABNORMAL LOW (ref 8.9–10.3)
GFR calc Af Amer: >60 mL/min (ref >60)
GFR, EST NON AFRICAN AMERICAN: 52 mL/min — AB (ref >60)
GLUCOSE: 149 mg/dL — AB (ref 65–99)
Potassium: 4.1 mmol/L (ref 3.5–5.1)
SODIUM: 139 mmol/L (ref 135–145)

## 2016-05-10 LAB — GLUCOSE, CAPILLARY
GLUCOSE-CAPILLARY: 125 mg/dL — AB (ref 65–99)
Glucose-Capillary: 113 mg/dL — ABNORMAL HIGH (ref 65–99)

## 2016-05-10 MED ORDER — OXYCODONE-ACETAMINOPHEN 5-325 MG PO TABS: 1.0000 | ORAL_TABLET | ORAL | 0 refills | Status: DC | PRN

## 2016-05-10 NOTE — Progress Notes (Signed)
Patient does not want to wear cpap. Encouraged patient and wife to call RN if he changes his mind.

## 2016-05-10 NOTE — Consult Note (Signed)
Urology Consult Note   Requesting Attending Physician:  Caren Griffins, MD Service Providing Consult: Urology Consulting Attending: Jeffie Pollock   Assessment:  Patient is a 83 y.o. male with infrarenal AAA, anxiety, OA, COPD with emphysema, nephrolithiasis, HTN, HLD, ischemic colitis, significant PAD & PVD (moderate left carotid artery stenosis, left renal artery stent, bilateral iliac stent, right SFA stent) OSA. He has known history of microhematuria and presented 4/7 with a 1-2 day history of gross hematuria with left flank pain. He does not have concern for infection. His hydroureteronephrosis appears to track down to the level of the mid left ureter with high density tissue at this spot consistent with blood clot or possible malignant process. Alternatively he could have just passed a left-sided ureteral calculus although would expect his hydroureteronephrosis to track down to the level of the bladder. He was admitted for pain control. Now s/p left ureteral stent placement and ureteral biopsy with fulguration 4/8.  Interval: AFVSS. 2.3L UOP. Cr 1.2. WBC 8. Hb 12.7.   Recommendations: -- Would recommend holding plavix as appropriate from medical standpoint for ~3-5 days postop -- Foley has been removed. If he is voiding well today and passes TOV would be suitable for discharge from our perspective -- Continue flomax/finasteride now and on dc -- We will coordinate follow up with Dr. Jeffie Pollock, Dr. [name] or partner in [employer] urology in near future -- We will follow up pathology from OR  Thank you for this consult. Please contact the urology consult pager with any further questions/concerns. Sharmaine Base, MD Urology Surgical Resident   Subjective Appreciative of care, very nice gentleman. Had some left lower back pain with urinating, I explained that this can be due to stent presence. No n/v. Pain currently controlled. Minor bloody urine at urethral meatus. Tolerating diet. OOB.   Objective    Vital signs in last 24 hours: BP 135/70 (BP Location: Right Arm)   Pulse 65   Temp 98.3 F (36.8 C) (Oral)   Resp 18   Ht 5\' 6"  (1.676 m)   Wt 62.3 kg (137 lb 5.6 oz)   SpO2 96%   BMI 22.17 kg/m   Intake/Output last 3 shifts: I/O last 3 completed shifts: In: 3106.3 [P.O.:480; I.V.:2626.3] Out: 2920 [Urine:2920]  Physical Exam General: NAD, A&O, resting, appropriate HEENT: [place]/AT, EOMI, MMM Pulmonary: Normal work of breathing on RA Cardiovascular: Regular rate & rhythm, HDS, adequate peripheral perfusion Abdomen: soft, mild TTP in left flank, mildly distended, no suprapubic fullness or tenderness GU: voiding spontaneously peach tinged urine in urinal at bedside, no appreciable CVA tenderness DRE: deferred Extremities: warm and well perfused, no edema  Most Recent Labs: Lab Results  Component Value Date   WBC 8.3 05/10/2016   HGB 12.7 (L) 05/10/2016   HCT 38.9 (L) 05/10/2016   PLT 157 05/10/2016    Lab Results  Component Value Date   NA 139 05/10/2016   K 4.1 05/10/2016   CL 105 05/10/2016   CO2 24 05/10/2016   BUN 16 05/10/2016   CREATININE 1.25 (H) 05/10/2016   CALCIUM 8.5 (L) 05/10/2016    Lab Results  Component Value Date   ALKPHOS 111 05/08/2016   BILITOT 0.8 05/08/2016   BILIDIR 0.1 03/06/2013   PROT 7.3 05/08/2016   ALBUMIN 3.4 (L) 05/08/2016   ALT 21 05/08/2016   AST 25 05/08/2016    Lab Results  Component Value Date   INR 0.99 05/09/2016   APTT 35 05/09/2016     Urine Culture: None  IMAGING: Dg Retrograde Pyelogram  Result Date: 05/10/2016 CLINICAL DATA:  Ureteral mass EXAM: INTRAOPERATIVE LEFT RETROGRADE UROGRAPHY TECHNIQUE: Images were obtained with the C-arm fluoroscopic device intraoperatively and submitted for interpretation post-operatively. Please see the procedural report for the amount of contrast and the fluoroscopy time utilized. COMPARISON:  None. FINDINGS: Images demonstrate placement of a left ureteral stent. No obvious  mass in the ureter is present. IMPRESSION: Left ureteral stent placement. Electronically Signed   By: Marybelle Killings M.D.   On: 05/10/2016 07:38   Ct Renal Stone Study  Result Date: 05/08/2016 CLINICAL DATA:  Right lower back pain since this morning. EXAM: CT ABDOMEN AND PELVIS WITHOUT CONTRAST TECHNIQUE: Multidetector CT imaging of the abdomen and pelvis was performed following the standard protocol without IV contrast. COMPARISON:  March 16, 2016 FINDINGS: Lower chest: Chronic changes and volume loss seen in the left lower lobe with bronchial wall thickening. There is increased density within the left lower lobe including a nodular component measuring 7 mm on series 6, image 3 not seen in February 2018. Lung bases are otherwise unremarkable. Hepatobiliary: No focal liver abnormality is seen. No gallstones, gallbladder wall thickening, or biliary dilatation. Pancreas: Unremarkable. No pancreatic ductal dilatation or surrounding inflammatory changes. Spleen: Normal in size without focal abnormality. Adrenals/Urinary Tract: The adrenal glands are normal. Cysts associated with the right kidney are again identified. No hydronephrosis or perinephric stranding on the right. There is mild to moderate hydronephrosis on the left which is new. There is perinephric stranding on the left as well which is new. The left ureter is dilated along much of its length. No discrete stone is identified. However, there is relatively higher attenuation in the left ureter just after crosses over the iliac vessels such as on axial image 55. No stones seen in the bladder. The bladder is unremarkable. Stomach/Bowel: The stomach and small bowel are normal. Scattered tiny colonic diverticuli are seen without diverticulitis. The colon and appendix are normal. Vascular/Lymphatic: Dense atherosclerosis is seen in the abdominal aorta. Stents are seen in the left renal artery and iliac vessels. There is aneurysmal dilatation of the infrarenal  abdominal aorta measuring measuring 3.5 cm today versus 3.4 cm previously, not changed given difference in slice selection. No periaortic stranding or fluid. No adenopathy. Reproductive: The prostate is enlarged exerting mass effect on the bladder. This is stable. Other: No other acute changes. Musculoskeletal: No acute or significant osseous findings. IMPRESSION: 1. New hydronephrosis and perinephric stranding on the left. New left ureterectasis. No discrete stone is seen in the left ureter although the attenuation of the ureter after it crosses the iliac vessels is greater than the more proximal ureter. The findings could be seen with a radiolucent stone and hematuria, a recently passed stone with hematuria, hematuria from unknown cause, or an underlying urothelial neoplasm. Recommend clinical correlation and consider urologic consultation. 2. Chronic changes and volume loss in the left lower lobe. There is increased density in the left lower lobe in addition to some nodularity. Since these findings are new since February 2018, I suspect an infectious or inflammatory cause. Recommend follow-up to resolution. 3. Atherosclerosis. 4. Aneurysmal dilatation of the abdominal aorta measuring up to 3.5 cm. Recommend followup by ultrasound in 2 years. This recommendation follows ACR consensus guidelines: White Paper of the ACR Incidental Findings Committee II on Vascular Findings. J Am Coll Radiol 2013; 10:789-794. Electronically Signed   By: Dorise Bullion III M.D   On: 05/08/2016 16:35

## 2016-05-10 NOTE — Discharge Instructions (Addendum)
Ureteral Stent Implantation, Care After Refer to this sheet in the next few weeks. These instructions provide you with information about caring for yourself after your procedure. Your health care provider may also give you more specific instructions. Your treatment has been planned according to current medical practices, but problems sometimes occur. Call your health care provider if you have any problems or questions after your procedure. What can I expect after the procedure? After the procedure, it is common to have:  Nausea.  Mild pain when you urinate. You may feel this pain in your lower back or lower abdomen. Pain should stop within a few minutes after you urinate. This may last for up to 1 week.  A small amount of blood in your urine for several days. Follow these instructions at home:   Medicines   Take over-the-counter and prescription medicines only as told by your health care provider.  If you were prescribed an antibiotic medicine, take it as told by your health care provider. Do not stop taking the antibiotic even if you start to feel better.  Do not drive for 24 hours if you received a sedative.  Do not drive or operate heavy machinery while taking prescription pain medicines. Activity   Return to your normal activities as told by your health care provider. Ask your health care provider what activities are safe for you.  Do not lift anything that is heavier than 10 lb (4.5 kg). Follow this limit for 1 week after your procedure, or for as long as told by your health care provider. General instructions   Watch for any blood in your urine. Call your health care provider if the amount of blood in your urine increases.  If you have a catheter:  Follow instructions from your health care provider about taking care of your catheter and collection bag.  Do not take baths, swim, or use a hot tub until your health care provider approves.  Drink enough fluid to keep your urine  clear or pale yellow.  Keep all follow-up visits as told by your health care provider. This is important. Contact a health care provider if:  You have pain that gets worse or does not get better with medicine, especially pain when you urinate.  You have difficulty urinating.  You feel nauseous or you vomit repeatedly during a period of more than 2 days after the procedure. Get help right away if:  Your urine is dark red or has blood clots in it.  You are leaking urine (have incontinence).  The end of the stent comes out of your urethra.  You cannot urinate.  You have sudden, sharp, or severe pain in your abdomen or lower back.  You have a fever. This information is not intended to replace advice given to you by your health care provider. Make sure you discuss any questions you have with your health care provider. Document Released: 09/20/2012 Document Revised: 06/26/2015 Document Reviewed: 08/02/2014 Elsevier Interactive Patient Education  2017 [place].   Resume Plavix on 05/14/2016  Follow with Dr. [name] as scheduled  Please get a complete blood count and chemistry panel checked by your Primary MD at your next visit, and again as instructed by your Primary MD. Please get your medications reviewed and adjusted by your Primary MD.  Please request your Primary MD to go over all Hospital Tests and Procedure/Radiological results at the follow up, please get all Hospital records sent to your Prim MD by signing hospital release before  you go home.  If you had Pneumonia of Lung problems at the Hospital: Please get a 2 view Chest X ray done in 6-8 weeks after hospital discharge or sooner if instructed by your Primary MD.  If you have Congestive Heart Failure: Please call your Cardiologist or Primary MD anytime you have any of the following symptoms:  1) 3 pound weight gain in 24 hours or 5 pounds in 1 week  2) shortness of breath, with or without a dry hacking cough  3)  swelling in the hands, feet or stomach  4) if you have to sleep on extra pillows at night in order to breathe  Follow cardiac low salt diet and 1.5 lit/day fluid restriction.  If you have diabetes Accuchecks 4 times/day, Once in AM empty stomach and then before each meal. Log in all results and show them to your primary doctor at your next visit. If any glucose reading is under 80 or above 300 call your primary MD immediately.  If you have Seizure/Convulsions/Epilepsy: Please do not drive, operate heavy machinery, participate in activities at heights or participate in high speed sports until you have seen by Primary MD or a Neurologist and advised to do so again.  If you had Gastrointestinal Bleeding: Please ask your Primary MD to check a complete blood count within one week of discharge or at your next visit. Your endoscopic/colonoscopic biopsies that are pending at the time of discharge, will also need to followed by your Primary MD.  Get Medicines reviewed and adjusted. Please take all your medications with you for your next visit with your Primary MD  Please request your Primary MD to go over all hospital tests and procedure/radiological results at the follow up, please ask your Primary MD to get all Hospital records sent to his/her office.  If you experience worsening of your admission symptoms, develop shortness of breath, life threatening emergency, suicidal or homicidal thoughts you must seek medical attention immediately by calling 911 or calling your MD immediately  if symptoms less severe.  You must read complete instructions/literature along with all the possible adverse reactions/side effects for all the Medicines you take and that have been prescribed to you. Take any new Medicines after you have completely understood and accpet all the possible adverse reactions/side effects.   Do not drive or operate heavy machinery when taking Pain medications.   Do not take more than  prescribed Pain, Sleep and Anxiety Medications  Special Instructions: If you have smoked or chewed Tobacco  in the last 2 yrs please stop smoking, stop any regular Alcohol  and or any Recreational drug use.  Wear Seat belts while driving.  Please note You were cared for by a hospitalist during your hospital stay. If you have any questions about your discharge medications or the care you received while you were in the hospital after you are discharged, you can call the unit and asked to speak with the hospitalist on call if the hospitalist that took care of you is not available. Once you are discharged, your primary care physician will handle any further medical issues. Please note that NO REFILLS for any discharge medications will be authorized once you are discharged, as it is imperative that you return to your primary care physician (or establish a relationship with a primary care physician if you do not have one) for your aftercare needs so that they can reassess your need for medications and monitor your lab values.  You can reach the  hospitalist office at phone [phone] or fax [phone]   If you do not have a primary care physician, you can call [phone] for a physician referral.  Activity: As tolerated with Full fall precautions use walker/cane & assistance as needed  Diet: regular  Disposition Home

## 2016-05-10 NOTE — Progress Notes (Signed)
1 Day Post-Op   Assessment and Plan: Left ureteral obstruction from clots and a left mid ureteral tumor.  He is doing well postop with minimal hematuria in foley.   Ok to remove foley and should be ok for discharge if able to void.   I will arrange f/u with one of our kidney surgeons.    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   He has no pain and minimal hematuria.   His Cr has improved post stenting.  ROS:  Review of Systems  All other systems reviewed and are negative.   Anti-infectives: Anti-infectives    Start     Dose/Rate Route Frequency Ordered Stop   05/09/16 1355  ceFAZolin (ANCEF) 2-4 GM/100ML-% IVPB    Comments:  Douglas Monroe   : cabinet override      05/09/16 1355 05/09/16 1440   05/09/16 0800  ceFAZolin (ANCEF) IVPB 2g/100 mL premix     2 g 200 mL/hr over 30 Minutes Intravenous On call to O.R. 05/09/16 0755 05/09/16 1440      Current Facility-Administered Medications  Medication Dose Route Frequency Provider Last Rate Last Dose  . 0.9 %  sodium chloride infusion   Intravenous Continuous Douglas Costa, MD 75 mL/hr at 05/09/16 2058    . acetaminophen (TYLENOL) tablet 650 mg  650 mg Oral Q6H PRN Douglas Costa, MD       Or  . acetaminophen (TYLENOL) suppository 650 mg  650 mg Rectal Q6H PRN Douglas Costa, MD      . albuterol (PROVENTIL) (2.5 MG/3ML) 0.083% nebulizer solution 2.5 mg  2.5 mg Nebulization Q4H PRN Douglas Costa, MD   2.5 mg at 05/09/16 2222  . amLODipine (NORVASC) tablet 5 mg  5 mg Oral QPM Douglas Costa, MD   5 mg at 05/09/16 1750  . atorvastatin (LIPITOR) tablet 20 mg  20 mg Oral q1800 Douglas Costa, MD   20 mg at 05/09/16 1750  . Chlorhexidine Gluconate Cloth 2 % PADS 6 each  6 each Topical Daily Douglas Karlyne Greenspan, MD   6 each at 05/09/16 0900  . cholecalciferol (VITAMIN D) tablet 1,000 Units  1,000 Units Oral Daily Douglas Costa, MD   1,000 Units at 05/09/16 [phone]  . finasteride (PROSCAR)  tablet 5 mg  5 mg Oral q morning - 10a Douglas Costa, MD   5 mg at 05/09/16 0935  . hydrALAZINE (APRESOLINE) injection 5 mg  5 mg Intravenous Q2H PRN Douglas Costa, MD      . morphine 2 MG/ML injection 2 mg  2 mg Intravenous Q3H PRN Douglas Costa, MD   2 mg at 05/09/16 0003  . mupirocin ointment (BACTROBAN) 2 % 1 application  1 application Nasal BID Douglas Griffins, MD   1 application at 11/91/47 2058  . nebivolol (BYSTOLIC) tablet 10 mg  10 mg Oral Daily Douglas Costa, MD   10 mg at 05/09/16 0934  . ondansetron (ZOFRAN) injection 4 mg  4 mg Intravenous Q8H PRN Douglas Costa, MD      . oxyCODONE-acetaminophen (PERCOCET/ROXICET) 5-325 MG per tablet 1 tablet  1 tablet Oral Q4H PRN Douglas Costa, MD   1 tablet at 05/08/16 2059  . pantoprazole (PROTONIX) EC tablet 40 mg  40 mg Oral Daily Douglas Costa, MD   40 mg at 05/09/16 0934  . senna-docusate (Senokot-S) tablet 1 tablet  1 tablet Oral QHS PRN Douglas Costa, MD      .  tamsulosin (FLOMAX) capsule 0.4 mg  0.4 mg Oral Daily Douglas Costa, MD   0.4 mg at 05/09/16 0934  . tiotropium (SPIRIVA) inhalation capsule 18 mcg  18 mcg Inhalation Daily Douglas Costa, MD   18 mcg at 05/09/16 0749  . zolpidem (AMBIEN) tablet 5 mg  5 mg Oral QHS PRN Douglas Costa, MD         Objective: Vital signs in last 24 hours: Temp:  [97.6 F (36.4 C)-99.3 F (37.4 C)] 98.3 F (36.8 C) (04/09 0516) Pulse Rate:  [64-93] 65 (04/09 0516) Resp:  [16-22] 18 (04/09 0516) BP: (100-135)/(50-76) 135/70 (04/09 0516) SpO2:  [92 %-100 %] 92 % (04/09 0516) Weight:  [62.3 kg (137 lb 5.6 oz)] 62.3 kg (137 lb 5.6 oz) (04/08 1551)  Intake/Output from previous day: 04/08 0701 - 04/09 0700 In: 2625 [P.O.:480; I.V.:2145] Out: 2370 [Urine:2370] Intake/Output this shift: No intake/output data recorded.   Physical Exam  Constitutional: He is well-developed, well-nourished, and in no distress.  Abdominal: Soft. He exhibits no distension. There is no tenderness.  Genitourinary:  Genitourinary Comments: Pink urine in foley  tube.  Vitals reviewed.   Lab Results:   Recent Labs  05/09/16 0819 05/10/16 0609  WBC 7.4 8.3  HGB 13.5 12.7*  HCT 40.6 38.9*  PLT 149* 157   BMET  Recent Labs  05/09/16 0819 05/10/16 0609  NA 140 139  K 4.6 4.1  CL 107 105  CO2 28 24  GLUCOSE 94 149*  BUN 20 16  CREATININE 1.44* 1.25*  CALCIUM 8.5* 8.5*   PT/INR  Recent Labs  05/09/16 0819  LABPROT 13.1  INR 0.99   ABG No results for input(s): PHART, HCO3 in the last 72 hours.  Invalid input(s): PCO2, PO2  Studies/Results: Dg Retrograde Pyelogram  Result Date: 05/10/2016 CLINICAL DATA:  Ureteral mass EXAM: INTRAOPERATIVE LEFT RETROGRADE UROGRAPHY TECHNIQUE: Images were obtained with the C-arm fluoroscopic device intraoperatively and submitted for interpretation post-operatively. Please see the procedural report for the amount of contrast and the fluoroscopy time utilized. COMPARISON:  None. FINDINGS: Images demonstrate placement of a left ureteral stent. No obvious mass in the ureter is present. IMPRESSION: Left ureteral stent placement. Electronically Signed   By: Douglas Monroe Monroe.   On: 05/10/2016 07:38   Ct Renal Stone Study  Result Date: 05/08/2016 CLINICAL DATA:  Right lower back pain since this morning. EXAM: CT ABDOMEN AND PELVIS WITHOUT CONTRAST TECHNIQUE: Multidetector CT imaging of the abdomen and pelvis was performed following the standard protocol without IV contrast. COMPARISON:  March 16, 2016 FINDINGS: Lower chest: Chronic changes and volume loss seen in the left lower lobe with bronchial wall thickening. There is increased density within the left lower lobe including a nodular component measuring 7 mm on series 6, image 3 not seen in February 2018. Lung bases are otherwise unremarkable. Hepatobiliary: No focal liver abnormality is seen. No gallstones, gallbladder wall thickening, or biliary dilatation. Pancreas: Unremarkable. No pancreatic ductal dilatation or surrounding inflammatory changes.  Spleen: Normal in size without focal abnormality. Adrenals/Urinary Tract: The adrenal glands are normal. Cysts associated with the right kidney are again identified. No hydronephrosis or perinephric stranding on the right. There is mild to moderate hydronephrosis on the left which is new. There is perinephric stranding on the left as well which is new. The left ureter is dilated along much of its length. No discrete stone is identified. However, there is relatively higher attenuation in the left ureter just after crosses over the iliac  vessels such as on axial image 55. No stones seen in the bladder. The bladder is unremarkable. Stomach/Bowel: The stomach and small bowel are normal. Scattered tiny colonic diverticuli are seen without diverticulitis. The colon and appendix are normal. Vascular/Lymphatic: Dense atherosclerosis is seen in the abdominal aorta. Stents are seen in the left renal artery and iliac vessels. There is aneurysmal dilatation of the infrarenal abdominal aorta measuring measuring 3.5 cm today versus 3.4 cm previously, not changed given difference in slice selection. No periaortic stranding or fluid. No adenopathy. Reproductive: The prostate is enlarged exerting mass effect on the bladder. This is stable. Other: No other acute changes. Musculoskeletal: No acute or significant osseous findings. IMPRESSION: 1. New hydronephrosis and perinephric stranding on the left. New left ureterectasis. No discrete stone is seen in the left ureter although the attenuation of the ureter after it crosses the iliac vessels is greater than the more proximal ureter. The findings could be seen with a radiolucent stone and hematuria, a recently passed stone with hematuria, hematuria from unknown cause, or an underlying urothelial neoplasm. Recommend clinical correlation and consider urologic consultation. 2. Chronic changes and volume loss in the left lower lobe. There is increased density in the left lower lobe in  addition to some nodularity. Since these findings are new since February 2018, I suspect an infectious or inflammatory cause. Recommend follow-up to resolution. 3. Atherosclerosis. 4. Aneurysmal dilatation of the abdominal aorta measuring up to 3.5 cm. Recommend followup by ultrasound in 2 years. This recommendation follows ACR consensus guidelines: White Paper of the ACR Incidental Findings Committee II on Vascular Findings. J Am Coll Radiol 2013; 10:789-794. Electronically Signed   By: Douglas Monroe   On: 05/08/2016 16:35           LOS: 2 days    Douglas Monroe J 05/10/[phone] ID: Douglas Monroe, male   DOB: 07-Aug-1932, 83 y.o.   MRN: [id number]

## 2016-05-10 NOTE — Discharge Summary (Signed)
Physician Discharge Summary  [name]:[id number] DOB: 1932/02/03 DOA: 05/08/2016  PCP: [name], MD  Admit date: 05/08/2016 Discharge date: 05/10/2016  Admitted From: home Disposition:  home  Recommendations for Outpatient Follow-up:  1. Follow up with Dr. [name] as scheduled 2. Hold Plavix for 4 days   Home Health: none Equipment/Devices: none  Discharge Condition: stable CODE STATUS: Full code Diet recommendation: heart healthy  HPI: Per Dr. [name], Douglas Monroe is a 83 y.o. male with medical history significant of infrarenal AAA, anxiety, OA, COPD with emphysema, GERD, nephrolithiasis, HTN, HLD, ischemic colitis, significant PAD &PVD (moderate left carotid artery stenosis, left renal artery stent, bilateral iliac stent, right SFA stent), OSA, who present with left flank pain or hematuria.  Patient states that her left flank pain starting this morning, it has been constant, 10 out of 10 in severity, sharp, radiating to left groin area, lower back and left side of abdomen. It is not aggravated or alleviated by any factors. It is associated with hematuria. No symptoms of UTI. He has mild nausea, but no vomiting, diarrhea. Denies fever or chills. Patient has some mild shortness rest due to COP, which is at baseline. No chest pain, cough, palpitation, unilateral weakness.  ED Course: pt was found to have negative urinalysis, WBC 8.9, worsening renal function, temperature normal, no tachycardia, oxygen saturation 90-97% on room air, elevated blood pressure 184/94. Pt is admitted to med-surg bed as inpt. Urology, Dr. [name] was consulted.  # CT per renal stone protocol showed:  1. New hydronephrosis and perinephric stranding on the left. New left ureterectasis. No discrete stone is seen in the left ureter although the attenuation of the ureter after it crosses the iliac vessels is greater than the more proximal ureter. The findings could be seen with a radiolucent stone and  hematuria, a recently passed stone with hematuria, hematuria from unknown cause, or an underlying urothelial neoplasm.  2. Chronic changes and volume loss in the left lower lobe. There is increased density in the left lower lobe in addition to some nodularity. Since these findings are new since February 2018, radiologist suspects an infectious or inflammatory cause. Recommend follow-up to resolution. 3. Atherosclerosis. 4. Aneurysmal dilatation of the abdominal aorta measuring up to 3.5 cm. Recommend followup by ultrasound in 2 years.  Hospital Course: Discharge Diagnoses:  Principal Problem:   Hydronephrosis Active Problems:   PVD S/P multiple PCIs   COPD (chronic obstructive pulmonary disease) (HCC)   HTN (hypertension)   Abdominal aortic aneurysm (HCC)   Hyperlipidemia   Acute renal failure superimposed on stage 2 chronic kidney disease (HCC)  Left Hydronephrosis-patient was admitted to the hospital with new hydronephrosis and perinephric stranding on the left, presumed initially due to obstructing stone.  Urology was consulted, and patient was taken to the operating room on 4/8 where he underwent cystourethroscopy, left retrograde pyelogram with stent placement.  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  Patient recovered well postop and was monitored overnight without issues.  On the day of discharge, his Foley catheter was removed, and he was able to void on his own.  He did experience pain on the left flank with voiding, which is likely due to stent placement, discussed with urology. UA without evidence of infection, patient afebrile and he has no leukocytosis, and no antibiotics were prescribed on discharge PVD S/P multiple PCIs, continue lipitor, given  hematuria and procedure his Plavix will be held for 3-4 days. Recent ischemic colitis -Reports no bleeding for the past 6 weeks COPD  -stable HTN -resume home medications Abdominal aortic aneurysm (HCC) -outpatient follow-up Hyperlipidemia -lipitor Acute renal failure superimposed on stage 2 chronic kidney disease (HCC) -Baseline creatinine 1.0-1.2. His creatinine 1.44, likely postobstructive, improved following procedure Chronic changes and volume loss in the left lower lobe -This is an incidental finding from CT scan. There is also increased density in the left lower lobe in addition to some nodularity. Since these findings are new since February 2018, radiologist suspectsan infectious or inflammatory cause. Recommend follow-up to resolution. -f/u with PCP BPH -stable  Discharge Instructions   Allergies as of 05/10/2016      Reactions   Pneumococcal Vaccines Swelling   Lip swelling      Medication List    STOP taking these medications   clopidogrel 75 MG tablet Commonly known as:  PLAVIX   valsartan 160 MG tablet Commonly known as:  DIOVAN     TAKE these medications   albuterol 108 (90 Base) MCG/ACT inhaler Commonly known as:  PROVENTIL HFA;VENTOLIN HFA Inhale 2 puffs into the lungs every 6 (six) hours as needed for shortness of breath.   amLODipine 5 MG tablet Commonly known as:  NORVASC Take 5 mg by mouth every evening.   atorvastatin 20 MG tablet Commonly known as:  LIPITOR TAKE 1 TABLET BY MOUTH  DAILY AT 6 PM.   BYSTOLIC 10 MG tablet Generic drug:  nebivolol Take 10 mg by mouth daily.   [employer] D3 2000 units Caps Generic drug:  Cholecalciferol Take 1 capsule by mouth daily.   finasteride 5 MG tablet Commonly known as:  PROSCAR Take 5 mg by mouth every morning.   oxyCODONE-acetaminophen 5-325 MG tablet Commonly known as:  PERCOCET/ROXICET Take 1 tablet by mouth every 4 (four) hours as needed for moderate pain.   pantoprazole 40 MG tablet Commonly known as:  PROTONIX Take 40 mg by mouth daily.   tamsulosin 0.4 MG Caps capsule Commonly known as:  FLOMAX Take 1 capsule by mouth daily.    tiotropium 18 MCG inhalation capsule Commonly known as:  SPIRIVA Place 18 mcg into inhaler and inhale daily.      Follow-up Information    [name], MD Follow up.   Specialty:  Urology Why:  The office will call to arrange f/u with one of our robotic surgeons.  Contact information: [place] [id number] [phone]        [name], MD Follow up.   Specialty:  Internal Medicine Why:  as needed Contact information: [address] [place] [address] [phone]          Allergies  Allergen Reactions  . Pneumococcal Vaccines Swelling    Lip swelling    Consultations:    Procedures/Studies: 1. Cystourethroscopy 2. Left retrograde pyelogram 3. Left ureteral stent placement, 6Fr x 26cm JJ ureteral stent without dangler 4. Left ureteral biopsy with holmium laser fulguration 5. Intraoperative fluoroscopy with interpretation <1hr. 6. Simple Foley catheter placement   Dg Retrograde Pyelogram  Result Date: 05/10/2016 CLINICAL DATA:  Ureteral mass EXAM: INTRAOPERATIVE LEFT RETROGRADE UROGRAPHY TECHNIQUE: Images were obtained with the C-arm fluoroscopic device intraoperatively and submitted for interpretation post-operatively. Please see the procedural report for the amount of contrast and the fluoroscopy time utilized. COMPARISON:  None. FINDINGS: Images demonstrate placement of a left ureteral stent. No obvious mass in the  ureter is present. IMPRESSION: Left ureteral stent placement. Electronically Signed   By: Marybelle Killings M.D.   On: 05/10/2016 07:38   Ct Renal Stone Study  Result Date: 05/08/2016 CLINICAL DATA:  Right lower back pain since this morning. EXAM: CT ABDOMEN AND PELVIS WITHOUT CONTRAST TECHNIQUE: Multidetector CT imaging of the abdomen and pelvis was performed following the standard protocol without IV contrast. COMPARISON:  March 16, 2016 FINDINGS: Lower chest: Chronic changes and volume loss seen in the left  lower lobe with bronchial wall thickening. There is increased density within the left lower lobe including a nodular component measuring 7 mm on series 6, image 3 not seen in February 2018. Lung bases are otherwise unremarkable. Hepatobiliary: No focal liver abnormality is seen. No gallstones, gallbladder wall thickening, or biliary dilatation. Pancreas: Unremarkable. No pancreatic ductal dilatation or surrounding inflammatory changes. Spleen: Normal in size without focal abnormality. Adrenals/Urinary Tract: The adrenal glands are normal. Cysts associated with the right kidney are again identified. No hydronephrosis or perinephric stranding on the right. There is mild to moderate hydronephrosis on the left which is new. There is perinephric stranding on the left as well which is new. The left ureter is dilated along much of its length. No discrete stone is identified. However, there is relatively higher attenuation in the left ureter just after crosses over the iliac vessels such as on axial image 55. No stones seen in the bladder. The bladder is unremarkable. Stomach/Bowel: The stomach and small bowel are normal. Scattered tiny colonic diverticuli are seen without diverticulitis. The colon and appendix are normal. Vascular/Lymphatic: Dense atherosclerosis is seen in the abdominal aorta. Stents are seen in the left renal artery and iliac vessels. There is aneurysmal dilatation of the infrarenal abdominal aorta measuring measuring 3.5 cm today versus 3.4 cm previously, not changed given difference in slice selection. No periaortic stranding or fluid. No adenopathy. Reproductive: The prostate is enlarged exerting mass effect on the bladder. This is stable. Other: No other acute changes. Musculoskeletal: No acute or significant osseous findings. IMPRESSION: 1. New hydronephrosis and perinephric stranding on the left. New left ureterectasis. No discrete stone is seen in the left ureter although the attenuation of the  ureter after it crosses the iliac vessels is greater than the more proximal ureter. The findings could be seen with a radiolucent stone and hematuria, a recently passed stone with hematuria, hematuria from unknown cause, or an underlying urothelial neoplasm. Recommend clinical correlation and consider urologic consultation. 2. Chronic changes and volume loss in the left lower lobe. There is increased density in the left lower lobe in addition to some nodularity. Since these findings are new since February 2018, I suspect an infectious or inflammatory cause. Recommend follow-up to resolution. 3. Atherosclerosis. 4. Aneurysmal dilatation of the abdominal aorta measuring up to 3.5 cm. Recommend followup by ultrasound in 2 years. This recommendation follows ACR consensus guidelines: White Paper of the ACR Incidental Findings Committee II on Vascular Findings. J Am Coll Radiol 2013; 10:789-794. Electronically Signed   By: Dorise Bullion III M.D   On: 05/08/2016 16:35      Subjective: - no chest pain, shortness of breath, no abdominal pain, nausea or vomiting.   Discharge Exam: Vitals:   05/10/16 0516 05/10/16 1408  BP: 135/70 130/76  Pulse: 65 80  Resp: 18 18  Temp: 98.3 F (36.8 C) 97.4 F (36.3 C)   Vitals:   05/09/16 2021 05/10/16 0516 05/10/16 0925 05/10/16 1408  BP: (!) 119/52 135/70  130/76  Pulse: 69 65  80  Resp: 17 18  18   Temp: 98.3 F (36.8 C) 98.3 F (36.8 C)  97.4 F (36.3 C)  TempSrc: Oral Oral  Oral  SpO2: 94% 92% 96% 97%  Weight:      Height:       General: Pt is alert, awake, not in acute distress Cardiovascular: RRR, S1/S2 +, no rubs, no gallops Respiratory: CTA bilaterally, no wheezing, no rhonchi Abdominal: Soft, NT, ND, bowel sounds + Extremities: no edema, no cyanosis  The results of significant diagnostics from this hospitalization (including imaging, microbiology, ancillary and laboratory) are listed below for reference.     Microbiology: Recent Results  (from the past 240 hour(s))  Surgical PCR screen     Status: Abnormal   Collection Time: 05/09/16  5:36 AM  Result Value Ref Range Status   MRSA, PCR NEGATIVE NEGATIVE Final   Staphylococcus aureus POSITIVE (A) NEGATIVE Final    Comment:        The Xpert SA Assay (FDA approved for NASAL specimens in patients over 31 years of age), is one component of a comprehensive surveillance program.  Test performance has been validated by [facility] for patients greater than or equal to 82 year old. It is not intended to diagnose infection nor to guide or monitor treatment.      Labs: BNP (last 3 results)  Recent Labs  03/15/16 0434  BNP 47.0   Basic Metabolic Panel:  Recent Labs Lab 05/08/16 1438 05/09/16 0819 05/10/16 0609  NA 138 140 139  K 4.6 4.6 4.1  CL 102 107 105  CO2 25 28 24   GLUCOSE 103* 94 149*  BUN 19 20 16   CREATININE 1.44* 1.44* 1.25*  CALCIUM 9.2 8.5* 8.5*   Liver Function Tests:  Recent Labs Lab 05/08/16 1438  AST 25  ALT 21  ALKPHOS 111  BILITOT 0.8  PROT 7.3  ALBUMIN 3.4*   No results for input(s): LIPASE, AMYLASE in the last 168 hours. No results for input(s): AMMONIA in the last 168 hours. CBC:  Recent Labs Lab 05/08/16 1438 05/09/16 0819 05/10/16 0609  WBC 8.9 7.4 8.3  HGB 14.6 13.5 12.7*  HCT 44.8 40.6 38.9*  MCV 94.1 96.2 94.2  PLT 181 149* 157   Cardiac Enzymes: No results for input(s): CKTOTAL, CKMB, CKMBINDEX, TROPONINI in the last 168 hours. BNP: Invalid input(s): POCBNP CBG:  Recent Labs Lab 05/10/16 0739 05/10/16 1133  GLUCAP 125* 113*   D-Dimer No results for input(s): DDIMER in the last 72 hours. Hgb A1c No results for input(s): HGBA1C in the last 72 hours. Lipid Profile No results for input(s): CHOL, HDL, LDLCALC, TRIG, CHOLHDL, LDLDIRECT in the last 72 hours. Thyroid function studies No results for input(s): TSH, T4TOTAL, T3FREE, THYROIDAB in the last 72 hours.  Invalid input(s): FREET3 Anemia work  up No results for input(s): VITAMINB12, FOLATE, FERRITIN, TIBC, IRON, RETICCTPCT in the last 72 hours. Urinalysis    Component Value Date/Time   COLORURINE AMBER (A) 05/08/2016 1432   APPEARANCEUR CLOUDY (A) 05/08/2016 1432   LABSPEC 1.016 05/08/2016 1432   PHURINE 5.0 05/08/2016 1432   GLUCOSEU NEGATIVE 05/08/2016 1432   HGBUR LARGE (A) 05/08/2016 1432   BILIRUBINUR NEGATIVE 05/08/2016 1432   KETONESUR NEGATIVE 05/08/2016 1432   PROTEINUR 100 (A) 05/08/2016 1432   UROBILINOGEN 0.2 04/24/2013 0238   NITRITE NEGATIVE 05/08/2016 1432   LEUKOCYTESUR NEGATIVE 05/08/2016 1432   Sepsis Labs Invalid input(s): PROCALCITONIN,  WBC,  LACTICIDVEN  Microbiology Recent Results (from the past 240 hour(s))  Surgical PCR screen     Status: Abnormal   Collection Time: 05/09/16  5:36 AM  Result Value Ref Range Status   MRSA, PCR NEGATIVE NEGATIVE Final   Staphylococcus aureus POSITIVE (A) NEGATIVE Final    Comment:        The Xpert SA Assay (FDA approved for NASAL specimens in patients over 69 years of age), is one component of a comprehensive surveillance program.  Test performance has been validated by [facility] for patients greater than or equal to 24 year old. It is not intended to diagnose infection nor to guide or monitor treatment.      Time coordinating discharge: 40 minutes  SIGNED:  Marzetta Board, MD  [facility] 05/10/2016, 3:27 PM Pager [phone]  If 7PM-7AM, please contact night-coverage [email or web address] Password [email or web address]

## 2016-05-12 ENCOUNTER — Other Ambulatory Visit: Payer: Self-pay | Admitting: Cardiovascular Disease

## 2016-05-12 DIAGNOSIS — I779 Disorder of arteries and arterioles, unspecified: Secondary | ICD-10-CM

## 2016-05-12 DIAGNOSIS — I739 Peripheral vascular disease, unspecified: Principal | ICD-10-CM

## 2016-05-13 ENCOUNTER — Other Ambulatory Visit: Payer: Self-pay | Admitting: Urology

## 2016-05-13 DIAGNOSIS — C669 Malignant neoplasm of unspecified ureter: Secondary | ICD-10-CM | POA: Diagnosis not present

## 2016-05-18 ENCOUNTER — Encounter (HOSPITAL_COMMUNITY): Payer: Self-pay | Admitting: Urology

## 2016-05-20 DIAGNOSIS — J441 Chronic obstructive pulmonary disease with (acute) exacerbation: Secondary | ICD-10-CM | POA: Diagnosis not present

## 2016-05-20 DIAGNOSIS — J069 Acute upper respiratory infection, unspecified: Secondary | ICD-10-CM | POA: Diagnosis not present

## 2016-05-25 ENCOUNTER — Ambulatory Visit (HOSPITAL_COMMUNITY)
Admission: RE | Admit: 2016-05-25 | Discharge: 2016-05-25 | Disposition: A | Discharge status: Home / Self Care | Payer: Medicare Other | Source: Ambulatory Visit | Attending: Urology | Admitting: Urology

## 2016-05-25 DIAGNOSIS — C669 Malignant neoplasm of unspecified ureter: Secondary | ICD-10-CM | POA: Insufficient documentation

## 2016-05-25 MED ORDER — FUROSEMIDE 10 MG/ML IJ SOLN
31.0000 mg | Freq: Once | INTRAMUSCULAR | Status: AC
  Administered 2016-05-25: 31 mg via INTRAVENOUS

## 2016-05-25 MED ORDER — FUROSEMIDE 10 MG/ML IJ SOLN
INTRAMUSCULAR | Status: AC
  Filled 2016-05-25: qty 4

## 2016-05-25 MED ORDER — TECHNETIUM TC 99M MERTIATIDE: 5.4000 | Freq: Once | INTRAVENOUS | Status: DC | PRN

## 2016-05-25 MED ORDER — TECHNETIUM TC 99M MERTIATIDE
5.4000 | Freq: Once | INTRAVENOUS | Status: AC | PRN
Start: 2016-05-25 — End: 2016-05-25
  Administered 2016-05-25: 5.4 via INTRAVENOUS

## 2016-06-03 ENCOUNTER — Encounter (HOSPITAL_COMMUNITY): Payer: Self-pay | Admitting: *Deleted

## 2016-06-03 ENCOUNTER — Other Ambulatory Visit: Payer: Self-pay | Admitting: Urology

## 2016-06-03 DIAGNOSIS — C662 Malignant neoplasm of left ureter: Secondary | ICD-10-CM | POA: Diagnosis not present

## 2016-06-04 NOTE — Progress Notes (Signed)
Patient's wife called to PST to inform us that son had brought to them the preop instructions I had given to them over phone since unable to contact them due to phone not being in service.  Wife voiced understanding over preop instructions.   Wife then called back approximately 30 minutes after the first phone call stating she had some questions regarding medications to take am of surgery. I informed wife that I had went by the medication list pharmacy had entered into epic and went by the times they had listed regarding preop medication instructions.  Wife stated " I did not have to be condescending".   I explained to her that I did not mean to be condescending but was following the medication list pharmacy had entered from earlier in day.  I told her we would review each medication and make sure correct time was listed which we did.  Wife stated that Amlodipine and Atorvastatin were both taken in am.  I instructed wife to take both Atorvastatin and Amlodipine in am of surgery along with other medications already listed to take.  Regarding the Plavix I told her for patient not to take am of surgery but to take after surgery on the day of surgery. Wife voiced understanding.  She stated she would call me back to confirm that patient indeed took Atorvastatin and Amlodipine as am medications.  At end of conversation I again apologized that I did not mean to sound " condescending" and wife understood that I was going by the pharmacy list in EPIC.

## 2016-06-07 ENCOUNTER — Ambulatory Visit (HOSPITAL_COMMUNITY)
Admission: RE | Admit: 2016-06-07 | Discharge: 2016-06-07 | Disposition: A | Discharge status: Home / Self Care | Payer: Medicare Other | Source: Ambulatory Visit | Attending: Urology | Admitting: Urology

## 2016-06-07 ENCOUNTER — Ambulatory Visit (HOSPITAL_COMMUNITY): Payer: Medicare Other | Admitting: Certified Registered Nurse Anesthetist

## 2016-06-07 ENCOUNTER — Encounter (HOSPITAL_COMMUNITY)
Admission: RE | Disposition: A | Discharge status: Home / Self Care | Payer: Self-pay | Source: Ambulatory Visit | Attending: Urology

## 2016-06-07 ENCOUNTER — Encounter (HOSPITAL_COMMUNITY): Payer: Self-pay | Admitting: *Deleted

## 2016-06-07 ENCOUNTER — Ambulatory Visit (HOSPITAL_COMMUNITY): Payer: Medicare Other

## 2016-06-07 DIAGNOSIS — E785 Hyperlipidemia, unspecified: Secondary | ICD-10-CM | POA: Insufficient documentation

## 2016-06-07 DIAGNOSIS — D4959 Neoplasm of unspecified behavior of other genitourinary organ: Secondary | ICD-10-CM | POA: Diagnosis not present

## 2016-06-07 DIAGNOSIS — I10 Essential (primary) hypertension: Secondary | ICD-10-CM | POA: Insufficient documentation

## 2016-06-07 DIAGNOSIS — Z7902 Long term (current) use of antithrombotics/antiplatelets: Secondary | ICD-10-CM | POA: Diagnosis not present

## 2016-06-07 DIAGNOSIS — K219 Gastro-esophageal reflux disease without esophagitis: Secondary | ICD-10-CM | POA: Insufficient documentation

## 2016-06-07 DIAGNOSIS — I2510 Atherosclerotic heart disease of native coronary artery without angina pectoris: Secondary | ICD-10-CM | POA: Diagnosis not present

## 2016-06-07 DIAGNOSIS — J439 Emphysema, unspecified: Secondary | ICD-10-CM | POA: Insufficient documentation

## 2016-06-07 DIAGNOSIS — Z87442 Personal history of urinary calculi: Secondary | ICD-10-CM | POA: Diagnosis not present

## 2016-06-07 DIAGNOSIS — I739 Peripheral vascular disease, unspecified: Secondary | ICD-10-CM | POA: Insufficient documentation

## 2016-06-07 DIAGNOSIS — Z79899 Other long term (current) drug therapy: Secondary | ICD-10-CM | POA: Diagnosis not present

## 2016-06-07 DIAGNOSIS — I6522 Occlusion and stenosis of left carotid artery: Secondary | ICD-10-CM | POA: Diagnosis not present

## 2016-06-07 DIAGNOSIS — Z87891 Personal history of nicotine dependence: Secondary | ICD-10-CM | POA: Diagnosis not present

## 2016-06-07 DIAGNOSIS — N1330 Unspecified hydronephrosis: Secondary | ICD-10-CM

## 2016-06-07 DIAGNOSIS — Z95820 Peripheral vascular angioplasty status with implants and grafts: Secondary | ICD-10-CM | POA: Insufficient documentation

## 2016-06-07 DIAGNOSIS — G4730 Sleep apnea, unspecified: Secondary | ICD-10-CM | POA: Insufficient documentation

## 2016-06-07 DIAGNOSIS — J449 Chronic obstructive pulmonary disease, unspecified: Secondary | ICD-10-CM | POA: Diagnosis not present

## 2016-06-07 DIAGNOSIS — C662 Malignant neoplasm of left ureter: Secondary | ICD-10-CM | POA: Diagnosis not present

## 2016-06-07 DIAGNOSIS — Z85828 Personal history of other malignant neoplasm of skin: Secondary | ICD-10-CM | POA: Diagnosis not present

## 2016-06-07 HISTORY — PX: CYSTOSCOPY W/ URETERAL STENT PLACEMENT: SHX1429

## 2016-06-07 HISTORY — PX: HOLMIUM LASER APPLICATION: SHX5852

## 2016-06-07 SURGERY — CYSTOSCOPY, WITH RETROGRADE PYELOGRAM AND URETERAL STENT INSERTION
Anesthesia: General | Site: Ureter | Laterality: Left

## 2016-06-07 MED ORDER — EPHEDRINE 5 MG/ML INJ
INTRAVENOUS | Status: AC
  Filled 2016-06-07: qty 10

## 2016-06-07 MED ORDER — SODIUM CHLORIDE 0.9 % IR SOLN
Status: DC | PRN
  Administered 2016-06-07: 4000 mL

## 2016-06-07 MED ORDER — FENTANYL CITRATE (PF) 100 MCG/2ML IJ SOLN
INTRAMUSCULAR | Status: DC | PRN
  Administered 2016-06-07 (×4): 25 ug via INTRAVENOUS

## 2016-06-07 MED ORDER — LACTATED RINGERS IV SOLN
INTRAVENOUS | Status: DC
  Administered 2016-06-07: 12:00:00 via INTRAVENOUS

## 2016-06-07 MED ORDER — LIDOCAINE HCL (CARDIAC) 20 MG/ML IV SOLN
INTRAVENOUS | Status: DC | PRN
  Administered 2016-06-07: 50 mg via INTRAVENOUS

## 2016-06-07 MED ORDER — EPHEDRINE SULFATE 50 MG/ML IJ SOLN
INTRAMUSCULAR | Status: DC | PRN
  Administered 2016-06-07 (×2): 10 mg via INTRAVENOUS

## 2016-06-07 MED ORDER — CEFAZOLIN SODIUM-DEXTROSE 2-4 GM/100ML-% IV SOLN
2.0000 g | INTRAVENOUS | Status: AC
  Administered 2016-06-07: 2 g via INTRAVENOUS
  Filled 2016-06-07: qty 100

## 2016-06-07 MED ORDER — OXYCODONE-ACETAMINOPHEN 5-325 MG PO TABS: 1.0000 | ORAL_TABLET | ORAL | 0 refills | Status: DC | PRN

## 2016-06-07 MED ORDER — FENTANYL CITRATE (PF) 100 MCG/2ML IJ SOLN: 25.0000 ug | INTRAMUSCULAR | Status: DC | PRN

## 2016-06-07 MED ORDER — PROPOFOL 10 MG/ML IV BOLUS
INTRAVENOUS | Status: DC | PRN
  Administered 2016-06-07: 150 mg via INTRAVENOUS

## 2016-06-07 MED ORDER — OXYCODONE-ACETAMINOPHEN 5-325 MG PO TABS: 1.0000 | ORAL_TABLET | Freq: Once | ORAL | Status: DC | PRN

## 2016-06-07 MED ORDER — PROPOFOL 10 MG/ML IV BOLUS
INTRAVENOUS | Status: AC
  Filled 2016-06-07: qty 20

## 2016-06-07 MED ORDER — ONDANSETRON HCL 4 MG/2ML IJ SOLN
INTRAMUSCULAR | Status: DC | PRN
  Administered 2016-06-07: 4 mg via INTRAVENOUS

## 2016-06-07 MED ORDER — ONDANSETRON HCL 4 MG/2ML IJ SOLN: 4.0000 mg | Freq: Once | INTRAMUSCULAR | Status: DC | PRN

## 2016-06-07 MED ORDER — FENTANYL CITRATE (PF) 100 MCG/2ML IJ SOLN
INTRAMUSCULAR | Status: AC
  Filled 2016-06-07: qty 2

## 2016-06-07 MED ORDER — LIDOCAINE 2% (20 MG/ML) 5 ML SYRINGE
INTRAMUSCULAR | Status: AC
  Filled 2016-06-07: qty 5

## 2016-06-07 MED ORDER — IOHEXOL 300 MG/ML  SOLN
INTRAMUSCULAR | Status: DC | PRN
  Administered 2016-06-07: 15 mL

## 2016-06-07 MED ORDER — 0.9 % SODIUM CHLORIDE (POUR BTL) OPTIME
TOPICAL | Status: DC | PRN
  Administered 2016-06-07: 1000 mL

## 2016-06-07 MED ORDER — ONDANSETRON HCL 4 MG/2ML IJ SOLN
INTRAMUSCULAR | Status: AC
  Filled 2016-06-07: qty 2

## 2016-06-07 SURGICAL SUPPLY — 29 items
BAG URO CATCHER STRL LF (MISCELLANEOUS) ×3 IMPLANT
BASKET DAKOTA 1.9FR 11X120 (BASKET) IMPLANT
BASKET LASER NITINOL 1.9FR (BASKET) IMPLANT
BSKT STON RTRVL 120 1.9FR (BASKET)
CATH FOLEY LATEX FREE 20FR (CATHETERS)
CATH FOLEY LF 20FR (CATHETERS) IMPLANT
CATH INTERMIT  6FR 70CM (CATHETERS) ×3 IMPLANT
CLOTH BEACON ORANGE TIMEOUT ST (SAFETY) ×3 IMPLANT
COVER SURGICAL LIGHT HANDLE (MISCELLANEOUS) ×3 IMPLANT
EXTRACTOR STONE NITINOL NGAGE (UROLOGICAL SUPPLIES) IMPLANT
FIBER LASER FLEXIVA 1000 (UROLOGICAL SUPPLIES) IMPLANT
FIBER LASER FLEXIVA 365 (UROLOGICAL SUPPLIES) IMPLANT
FIBER LASER FLEXIVA 550 (UROLOGICAL SUPPLIES) IMPLANT
FIBER LASER TRAC TIP (UROLOGICAL SUPPLIES) ×2 IMPLANT
GLOVE BIO SURGEON STRL SZ8 (GLOVE) ×3 IMPLANT
GOWN STRL REUS W/TWL XL LVL3 (GOWN DISPOSABLE) ×3 IMPLANT
GUIDEWIRE ANG ZIPWIRE 038X150 (WIRE) ×3 IMPLANT
GUIDEWIRE STR DUAL SENSOR (WIRE) ×3 IMPLANT
IV NS 1000ML (IV SOLUTION) ×3
IV NS 1000ML BAXH (IV SOLUTION) ×1 IMPLANT
MANIFOLD NEPTUNE II (INSTRUMENTS) ×3 IMPLANT
PACK CYSTO (CUSTOM PROCEDURE TRAY) ×3 IMPLANT
SHEATH ACCESS URETERAL 38CM (SHEATH) IMPLANT
STENT CONTOUR 6FRX26X.038 (STENTS) ×2 IMPLANT
SYR CONTROL 10ML LL (SYRINGE) IMPLANT
SYRINGE IRR TOOMEY STRL 70CC (SYRINGE) IMPLANT
TUBE FEEDING 8FR 16IN STR KANG (MISCELLANEOUS) IMPLANT
TUBING CONNECTING 10 (TUBING) ×2 IMPLANT
TUBING CONNECTING 10' (TUBING) ×1

## 2016-06-07 NOTE — Transfer of Care (Signed)
Immediate Anesthesia Transfer of Care Note  Patient: Douglas Monroe  Procedure(s) Performed: Procedure(s): CYSTOSCOPY WITH RETROGRADE PYELOGRAM/URETERAL STENT REPLACEMENT (Left) TUMOR ABLATION WITH HOLMIUM LASER (Left)  Patient Location: PACU  Anesthesia Type:General  Level of Consciousness: Patient easily awoken, sedated, comfortable, cooperative, following commands, responds to stimulation.   Airway & Oxygen Therapy: Patient spontaneously breathing, ventilating well, oxygen via simple oxygen mask.  Post-op Assessment: Report given to PACU RN, vital signs reviewed and stable, moving all extremities.   Post vital signs: Reviewed and stable.  Complications: No apparent anesthesia complications  Last Vitals:  Vitals:   06/07/16 1114  BP: 126/85  Pulse: 73  Resp: 16  Temp: 36.6 C    Last Pain:  Vitals:   06/07/16 1114  TempSrc: Oral      Patients Stated Pain Goal: 4 (74/71/59 5396)  Complications: No apparent anesthesia complications

## 2016-06-07 NOTE — Anesthesia Procedure Notes (Signed)
Procedure Name: LMA Insertion Date/Time: 06/07/2016 1:22 PM Performed by: Deliah Boston Pre-anesthesia Checklist: Patient identified, Emergency Drugs available, Suction available and Patient being monitored Patient Re-evaluated:Patient Re-evaluated prior to inductionOxygen Delivery Method: Circle system utilized Preoxygenation: Pre-oxygenation with 100% oxygen Intubation Type: IV induction Ventilation: Mask ventilation without difficulty LMA: LMA inserted LMA Size: 4.0 Number of attempts: 1 Placement Confirmation: positive ETCO2 and breath sounds checked- equal and bilateral Tube secured with: Tape Dental Injury: Teeth and Oropharynx as per pre-operative assessment

## 2016-06-07 NOTE — Brief Op Note (Signed)
06/07/2016  2:06 PM  PATIENT:  Georjean Mode  83 y.o. male  PRE-OPERATIVE DIAGNOSIS:  LEFT URETERAL TUMOR  POST-OPERATIVE DIAGNOSIS:  left ureteral tumor  PROCEDURE:  Procedure(s): CYSTOSCOPY WITH RETROGRADE PYELOGRAM/URETERAL STENT REPLACEMENT (Left) TUMOR ABLATION WITH HOLMIUM LASER (Left)  SURGEON:  Surgeon(s) and Role:    * McKenzie, Candee Furbish, MD - Primary  PHYSICIAN ASSISTANT:   ASSISTANTS: none   ANESTHESIA:   general  EBL:  No intake/output data recorded.  BLOOD ADMINISTERED:none  DRAINS: left 6x26 JJ ureteral stent   LOCAL MEDICATIONS USED:  NONE  SPECIMEN:  No Specimen  DISPOSITION OF SPECIMEN:  N/A  COUNTS:  YES  TOURNIQUET:  * No tourniquets in log *  DICTATION: .Note written in EPIC  PLAN OF CARE: Discharge to home after PACU  PATIENT DISPOSITION:  PACU - hemodynamically stable.   Delay start of Pharmacological VTE agent (>24hrs) due to surgical blood loss or risk of bleeding: not applicable

## 2016-06-07 NOTE — Anesthesia Preprocedure Evaluation (Addendum)
Anesthesia Evaluation  Patient identified by MRN, date of birth, ID band Patient awake    Reviewed: Allergy & Precautions, NPO status , Patient's Chart, lab work & pertinent test results, reviewed documented beta blocker date and time   Airway Mallampati: I       Dental  (+) Edentulous Upper, Edentulous Lower   Pulmonary sleep apnea , COPD,  COPD inhaler, former smoker,    Pulmonary exam normal breath sounds clear to auscultation       Cardiovascular hypertension, Pt. on medications and Pt. on home beta blockers + Peripheral Vascular Disease (s/p B/L iliac stents)  Normal cardiovascular exam Rhythm:Regular Rate:Normal     Neuro/Psych  Headaches, PSYCHIATRIC DISORDERS Anxiety    GI/Hepatic Neg liver ROS, GERD  Medicated,  Endo/Other  negative endocrine ROS  Renal/GU Renal hypertensionRenal disease     Musculoskeletal  (+) Arthritis , Osteoarthritis,    Abdominal   Peds  Hematology  (+) Blood dyscrasia (Plavix), ,   Anesthesia Other Findings Day of surgery medications reviewed with the patient.  Reproductive/Obstetrics                            Anesthesia Physical Anesthesia Plan  ASA: III  Anesthesia Plan: General   Post-op Pain Management:    Induction: Intravenous  Airway Management Planned: LMA  Additional Equipment:   Intra-op Plan:   Post-operative Plan: Extubation in OR  Informed Consent: I have reviewed the patients History and Physical, chart, labs and discussed the procedure including the risks, benefits and alternatives for the proposed anesthesia with the patient or authorized representative who has indicated his/her understanding and acceptance.   Dental advisory given  Plan Discussed with: CRNA  Anesthesia Plan Comments: (Risks/benefits of general anesthesia discussed with patient including risk of damage to teeth, lips, gum, and tongue, nausea/vomiting, allergic  reactions to medications, and the possibility of heart attack, stroke and death.  All patient questions answered.  Patient wishes to proceed.)        Anesthesia Quick Evaluation

## 2016-06-07 NOTE — Discharge Instructions (Signed)
General Anesthesia, Adult, Care After These instructions provide you with information about caring for yourself after your procedure. Your health care provider may also give you more specific instructions. Your treatment has been planned according to current medical practices, but problems sometimes occur. Call your health care provider if you have any problems or questions after your procedure. What can I expect after the procedure? After the procedure, it is common to have: Vomiting. A sore throat. Mental slowness. It is common to feel: Nauseous. Cold or shivery. Sleepy. Tired. Sore or achy, even in parts of your body where you did not have surgery. Follow these instructions at home: For at least 24 hours after the procedure:  Do not: Participate in activities where you could fall or become injured. Drive. Use heavy machinery. Drink alcohol. Take sleeping pills or medicines that cause drowsiness. Make important decisions or sign legal documents. Take care of children on your own. Rest. Eating and drinking  If you vomit, drink water, juice, or soup when you can drink without vomiting. Drink enough fluid to keep your urine clear or pale yellow. Make sure you have little or no nausea before eating solid foods. Follow the diet recommended by your health care provider. General instructions  Have a responsible adult stay with you until you are awake and alert. Return to your normal activities as told by your health care provider. Ask your health care provider what activities are safe for you. Take over-the-counter and prescription medicines only as told by your health care provider. If you smoke, do not smoke without supervision. Keep all follow-up visits as told by your health care provider. This is important. Contact a health care provider if: You continue to have nausea or vomiting at home, and medicines are not helpful. You cannot drink fluids or start eating again. You cannot  urinate after 8-12 hours. You develop a skin rash. You have fever. You have increasing redness at the site of your procedure. Get help right away if: You have difficulty breathing. You have chest pain. You have unexpected bleeding. You feel that you are having a life-threatening or urgent problem. This information is not intended to replace advice given to you by your health care provider. Make sure you discuss any questions you have with your health care provider. Document Released: 04/26/2000 Document Revised: 06/23/2015 Document Reviewed: 01/02/2015 Elsevier Interactive Patient Education  2017 [employer]     Ureteral Stent Implantation, Care After Refer to this sheet in the next few weeks. These instructions provide you with information about caring for yourself after your procedure. Your health care provider may also give you more specific instructions. Your treatment has been planned according to current medical practices, but problems sometimes occur. Call your health care provider if you have any problems or questions after your procedure. What can I expect after the procedure? After the procedure, it is common to have:  Nausea.  Mild pain when you urinate. You may feel this pain in your lower back or lower abdomen. Pain should stop within a few minutes after you urinate. This may last for up to 1 week.  A small amount of blood in your urine for several days. Follow these instructions at home:   Medicines   Take over-the-counter and prescription medicines only as told by your health care provider.  If you were prescribed an antibiotic medicine, take it as told by your health care provider. Do not stop taking the antibiotic even if you start to feel better.  Do not drive for 24 hours if you received a sedative.  Do not drive or operate heavy machinery while taking prescription pain medicines. Activity   Return to your normal activities as told by your health care  provider. Ask your health care provider what activities are safe for you.  Do not lift anything that is heavier than 10 lb (4.5 kg). Follow this limit for 1 week after your procedure, or for as long as told by your health care provider. General instructions   Watch for any blood in your urine. Call your health care provider if the amount of blood in your urine increases.  If you have a catheter:  Follow instructions from your health care provider about taking care of your catheter and collection bag.  Do not take baths, swim, or use a hot tub until your health care provider approves.  Drink enough fluid to keep your urine clear or pale yellow.  Keep all follow-up visits as told by your health care provider. This is important. Contact a health care provider if:  You have pain that gets worse or does not get better with medicine, especially pain when you urinate.  You have difficulty urinating.  You feel nauseous or you vomit repeatedly during a period of more than 2 days after the procedure. Get help right away if:  Your urine is dark red or has blood clots in it.  You are leaking urine (have incontinence).  The end of the stent comes out of your urethra.  You cannot urinate.  You have sudden, sharp, or severe pain in your abdomen or lower back.  You have a fever. This information is not intended to replace advice given to you by your health care provider. Make sure you discuss any questions you have with your health care provider. Document Released: 09/20/2012 Document Revised: 06/26/2015 Document Reviewed: 08/02/2014 Elsevier Interactive Patient Education  2017 [employer].

## 2016-06-07 NOTE — H&P (Signed)
Urology Admission H&P  Chief Complaint:  Left ureteral tumor  History of Present Illness: Douglas Monroe is an 83yo with left ureteral high grade TCC who presents for ureteral tumor ablation.  Past Medical History:  Diagnosis Date  . Abdominal aortic aneurysm ([place])    infrarenal -- monitored by dr berry (note states stable 4.0 to 4.1cm)  . Anxiety    situational  . Arthritis    "hands" (03/15/2016)  . Cancer of skin of temple 2017   right  . COPD with emphysema ([place])   . Dyspnea    with exertion  . Exertional dyspnea   . First degree heart block   . GERD (gastroesophageal reflux disease)   . Headache(784.0)    uses goody powder; "maybe weekly, maybe not that much" (03/15/2016)  . History of kidney stones   . HOH (hard of hearing)    left ear; no hearing aid   . HTN (hypertension) 08/20/2011  . Hyperlipidemia   . Hypertension   . Ischemic colitis ([place])   . Left carotid artery stenosis    moderate  . Migraine    "long time ago" (03/15/2016)  . 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 PVD (peripheral vascular disease) with claudication [facility]) cardiologist-  dr berry   s/p bilateral iliac stents and right sfa stenting/  doppper study 09-05-2012  revealed ABIs close to one bilaterally with patent stents/  stenting left renal artery stenosis   . Right ureteral stone   . S/P arterial stent    left renal angioplasty and stent 11-02-2012  . Sleep apnea    at risk for OSA. stop/bang score= 6; sent to PCP 10/04/13; denies use of mask on 03/15/2016  . Tubular adenoma of colon    Past Surgical History:  Procedure Laterality Date  . ABDOMINAL ANGIOGRAM  08/19/2011   Left common iliac artery at the ostium, 6x48mm ICAST covered stent, common femoral artery, 8x131mm Abbott absolute pro nitinol self-expanding stent and resulting in reduction of 80 and 90% stenosis to 0% residual  . ABDOMINAL ANGIOGRAM  10/08/2010   Mid-right SFA, 6x120 EV3 Protege nitinol self-expanding  stent, resulting in reduction of CTO to 0% residual  . ABDOMINAL ANGIOGRAM  06/22/2010   Rt Common Femoral Artery-8x3 Absolute Pro Nitinol self-expanding stent-90% stenosis to 0% residual; Rt External Iliac Artery-9x3 Absolute Pro Nitinol self-expanding stent-70% stenosis to 0% residual; Left External Iliac Artery-8x4 Absolute Pro Nitinol self-expanding stent-90% to 0% residual  . ATHERECTOMY N/A 08/19/2011   Procedure: ATHERECTOMY;  Surgeon: Lorretta Harp, MD;  Location: [facility] CATH LAB;  Service: Cardiovascular;  Laterality: N/A;  . BACK SURGERY    . CARDIAC CATHETERIZATION  10/26/2002  dr Tami Ribas   normal coronary arteries/ normal  LVF  . CARDIOVASCULAR STRESS TEST  06-12-2010   dr berry   normal perfusion study/ no ischemia/  ef 69%  . COLONOSCOPY WITH PROPOFOL N/A 03/19/2016   Procedure: COLONOSCOPY WITH PROPOFOL;  Surgeon: Mauri Pole, MD;  Location: [facility];  Service: Endoscopy;  Laterality: N/A;  was inpatient, but got d/c'd home and is coming back as an OP  . CYSTOSCOPY WITH RETROGRADE PYELOGRAM, URETEROSCOPY AND STENT PLACEMENT Right 10/10/2013   Procedure: uretheral dilation, CYSTOSCOPY, URETEROSCOPY, stone extraction, STENT PLACEMENT;  Surgeon: [name], MD;  Location: [facility];  Service: Urology;  Laterality: Right;  . CYSTOSCOPY WITH URETEROSCOPY Left 05/09/2016   Procedure: CYSTOSCOPY, RETROGRADE, LEFT URETEROSCOPY, LEFT URETERAL BIOPSIES, HOLMIUM LASER FULGERATION, LEFT URETERAL  STENT PLACEMENT;  Surgeon: Irine Seal, MD;  Location: [facility];  Service: Urology;  Laterality: Left;  . HOLMIUM LASER APPLICATION Right 0/02/270   Procedure: HOLMIUM LASER APPLICATION;  Surgeon: [name], MD;  Location[facility];  Service: Urology;  Laterality: Right;  . LUMBAR DISC SURGERY  1970's  . RENAL ANGIOGRAM N/A 11/02/2012   Procedure: RENAL ANGIOGRAM;  Surgeon: Lorretta Harp, MD;  Location[facility] C[facility] LAB;  Service: Cardiovascular;  Laterality:  N/A;  . RENAL ARTERY STENT Left 11/02/2012  . SKIN CANCER EXCISION Right 2017   temple  . SOFT TISSUE CYST EXCISION  1960's   "outside part of lung LLL"    Home Medications:  Prescriptions Prior to Admission  Medication Sig Dispense Refill Last Dose  . albuterol (PROVENTIL HFA;VENTOLIN HFA) 108 (90 BASE) MCG/ACT inhaler Inhale 2 puffs into the lungs every 6 (six) hours as needed for shortness of breath.    06/07/2016 at 1045  . amLODipine (NORVASC) 5 MG tablet Take 5 mg by mouth daily.    06/07/2016 at 0900  . Aspirin-Acetaminophen-Caffeine (GOODY HEADACHE PO) Take 1 packet by mouth daily as needed (headaches).   Past Week at Unknown time  . atorvastatin (LIPITOR) 20 MG tablet TAKE 1 TABLET BY MOUTH  DAILY AT 6 PM. (Patient taking differently: Take 20mg  by mouth daily) 90 tablet 3 06/07/2016 at 0900  . BYSTOLIC 10 MG tablet Take 10 mg by mouth daily.   06/07/2016 at 0900  . clopidogrel (PLAVIX) 75 MG tablet Take 75 mg by mouth daily.   06/06/2016 at [place]  . [facility] D3 2000 units CAPS Take 2,000 Units by mouth daily.   5 06/07/2016 at 0900  . dextromethorphan (DELSYM) 30 MG/5ML liquid Take 30 mg by mouth as needed for cough.   06/06/2016 at 2300  . finasteride (PROSCAR) 5 MG tablet Take 5 mg by mouth every morning.   06/07/2016 at 0900  . pantoprazole (PROTONIX) 40 MG tablet Take 40 mg by mouth daily.  5 06/07/2016 at 0900  . tamsulosin (FLOMAX) 0.4 MG CAPS capsule Take 0.4 mg by mouth daily.    06/07/2016 at 0900  . tiotropium (SPIRIVA) 18 MCG inhalation capsule Place 18 mcg into inhaler and inhale daily.   06/07/2016 at 0900  . oxyCODONE-acetaminophen (PERCOCET/ROXICET) 5-325 MG tablet Take 1 tablet by mouth every 4 (four) hours as needed for moderate pain. (Patient not taking: Reported on 06/04/2016) 20 tablet 0 Completed Course at Unknown time   Allergies:  Allergies  Allergen Reactions  . Pneumococcal Vaccines Swelling    Lip swelling    Family History  Problem Relation Age of Onset  . Hypertension Mother    . Cancer Father   . Diabetes Brother   . Hypertension Son    Social History:  reports that he quit smoking about 6 years ago. His smoking use included Cigarettes. He has a 60.00 pack-year smoking history. He has never used smokeless tobacco. He reports that he does not drink alcohol or use drugs.  Review of Systems  Genitourinary: Positive for dysuria, flank pain, frequency, hematuria and urgency.  All other systems reviewed and are negative.   Physical Exam:  Vital signs in last 24 hours: Temp:  [97.8 F (36.6 C)] 97.8 F (36.6 C) (05/07 1114) Pulse Rate:  [73] 73 (05/07 1114) Resp:  [16] 16 (05/07 1114) BP: (126)/(85) 126/85 (05/07 1114) SpO2:  [97 %] 97 % (05/07 1114) Weight:  [64.2 kg (141 lb 8 oz)] 64.2  kg (141 lb 8 oz) (05/07 1114) Physical Exam  Constitutional: He is oriented to person, place, and time. He appears well-developed and well-nourished.  HENT:  Head: Normocephalic and atraumatic.  Eyes: EOM are normal. Pupils are equal, round, and reactive to light.  Neck: Normal range of motion. No thyromegaly present.  Cardiovascular: Normal rate and regular rhythm.   Respiratory: Effort normal. No respiratory distress.  GI: Soft. He exhibits no distension.  Musculoskeletal: Normal range of motion. He exhibits no edema.  Neurological: He is alert and oriented to person, place, and time.  Skin: Skin is warm and dry.  Psychiatric: He has a normal mood and affect. His behavior is normal. Judgment and thought content normal.    Laboratory Data:  No results found for this or any previous visit (from the past 24 hour(s)). No results found for this or any previous visit (from the past 240 hour(s)). Creatinine: No results for input(s): CREATININE in the last 168 hours. Baseline Creatinine: unknown  Impression/Assessment:  83yo with left ureteral high grade tcc  Plan:  The risks/benefits/alternatives to left ureteral tumor ablation was explained to the patient and he  understands and wishes to proceed with surgery  Nicolette Bang 06/07/2016, 1:07 PM

## 2016-06-08 ENCOUNTER — Encounter (HOSPITAL_COMMUNITY): Payer: Self-pay | Admitting: Urology

## 2016-06-08 NOTE — Anesthesia Postprocedure Evaluation (Signed)
Anesthesia Post Note  Patient: [name]  Procedure(s) Performed: Procedure(s) (LRB): CYSTOSCOPY WITH RETROGRADE PYELOGRAM/URETERAL STENT REPLACEMENT (Left) TUMOR ABLATION WITH HOLMIUM LASER (Left)  Patient location during evaluation: PACU Anesthesia Type: General Level of consciousness: awake and alert Pain management: pain level controlled Vital Signs Assessment: post-procedure vital signs reviewed and stable Respiratory status: spontaneous breathing, nonlabored ventilation, respiratory function stable and patient connected to nasal cannula oxygen Cardiovascular status: blood pressure returned to baseline and stable Postop Assessment: no signs of nausea or vomiting Anesthetic complications: no       Last Vitals:  Vitals:   06/07/16 1457 06/07/16 1548  BP: 128/60 (!) 141/66  Pulse: 64 78  Resp: 16 16  Temp: 36.4 C 36.3 C    Last Pain:  Vitals:   06/07/16 1548  TempSrc: Oral  PainSc:                  Catalina Gravel

## 2016-06-14 ENCOUNTER — Ambulatory Visit (HOSPITAL_COMMUNITY)
Admission: RE | Admit: 2016-06-14 | Discharge: 2016-06-14 | Disposition: A | Discharge status: Home / Self Care | Payer: Medicare Other | Source: Ambulatory Visit | Attending: Internal Medicine | Admitting: Internal Medicine

## 2016-06-14 DIAGNOSIS — I739 Peripheral vascular disease, unspecified: Secondary | ICD-10-CM | POA: Diagnosis not present

## 2016-06-14 DIAGNOSIS — I714 Abdominal aortic aneurysm, without rupture, unspecified: Secondary | ICD-10-CM

## 2016-06-14 DIAGNOSIS — I701 Atherosclerosis of renal artery: Secondary | ICD-10-CM | POA: Diagnosis not present

## 2016-06-14 DIAGNOSIS — I10 Essential (primary) hypertension: Secondary | ICD-10-CM | POA: Insufficient documentation

## 2016-06-14 DIAGNOSIS — N281 Cyst of kidney, acquired: Secondary | ICD-10-CM | POA: Insufficient documentation

## 2016-06-15 ENCOUNTER — Other Ambulatory Visit: Payer: Self-pay | Admitting: Cardiovascular Disease

## 2016-06-15 DIAGNOSIS — I701 Atherosclerosis of renal artery: Secondary | ICD-10-CM

## 2016-06-15 DIAGNOSIS — C662 Malignant neoplasm of left ureter: Secondary | ICD-10-CM | POA: Diagnosis not present

## 2016-06-15 NOTE — Op Note (Signed)
.  Preoperative diagnosis: Left ureteral tumor  Postoperative diagnosis: Same  Procedure: 1 cystoscopy 2. Left retrograde pyelography 3.  Intraoperative fluoroscopy, under one hour, with interpretation 4.  Left ureteral tumor ablation with laser 5.  Left 6 x 26 JJ stent exchange  Attending: Rosie Fate  Anesthesia: General  Estimated blood loss: None  Drains: Left 6 x 26 JJ ureteral stent without tether  Specimens: none  Antibiotics: ancef  Findings: left distal and mid ureteral tumor, 0.5cm in size. no hydronephrosis.  Indications: Patient is a 83 year old male with a history of left ureteral tumor who is not a candidate for nephroureterectomy. .  After discussing treatment options, he decided proceed with left ureteral tumor ablation.  Procedure her in detail: The patient was brought to the operating room and a brief timeout was done to ensure correct patient, correct procedure, correct site.  General anesthesia was administered patient was placed in dorsal lithotomy position.  Her genitalia was then prepped and draped in usual sterile fashion.  A rigid 40 French cystoscope was passed in the urethra and the bladder.  Bladder was inspected free masses or lesions.  the ureteral orifices were in the normal orthotopic locations. Using a grasper the ureteral stent was brought to the urethral meatus.   a 6 french ureteral catheter was then instilled into the left ureteral orifice. A zip wire was advanced through the stent and up top the renal pelvis. The stent was then removed. a gentle retrograde was obtained and findings noted above.   we then removed the cystoscope and cannulated the left ureteral orifice with a semirigid ureteroscope.  We located small papillary tumors int he mid and distal ureter. Using a 365nm laser fiber the tumors were ablated.  Once this was complete we turned out attention to placing a ureteral stent. We then placed a 6 x 26 double-j ureteral stent over the  original zip wire.  We then removed the wire and good coil was noted in the the renal pelvis under fluoroscopy and the bladder under direct vision. the bladder was then drained and this concluded the procedure which was well tolerated by patient.  Complications: None  Condition: Stable, extubated, transferred to PACU  Plan: Patient is to be discharged home as to follow-up 1 month for 6 weeks of BCG therapy.

## 2016-06-17 ENCOUNTER — Other Ambulatory Visit: Payer: Self-pay | Admitting: Urology

## 2016-07-02 NOTE — Addendum Note (Signed)
Addendum  created 07/02/16 1211 by Anida Deol D, MD   Sign clinical note    

## 2016-07-16 DIAGNOSIS — Z5111 Encounter for antineoplastic chemotherapy: Secondary | ICD-10-CM | POA: Diagnosis not present

## 2016-07-16 DIAGNOSIS — C669 Malignant neoplasm of unspecified ureter: Secondary | ICD-10-CM | POA: Diagnosis not present

## 2016-07-23 DIAGNOSIS — C669 Malignant neoplasm of unspecified ureter: Secondary | ICD-10-CM | POA: Diagnosis not present

## 2016-07-23 DIAGNOSIS — Z5111 Encounter for antineoplastic chemotherapy: Secondary | ICD-10-CM | POA: Diagnosis not present

## 2016-07-30 DIAGNOSIS — Z5111 Encounter for antineoplastic chemotherapy: Secondary | ICD-10-CM | POA: Diagnosis not present

## 2016-07-30 DIAGNOSIS — C669 Malignant neoplasm of unspecified ureter: Secondary | ICD-10-CM | POA: Diagnosis not present

## 2016-08-06 DIAGNOSIS — Z5111 Encounter for antineoplastic chemotherapy: Secondary | ICD-10-CM | POA: Diagnosis not present

## 2016-08-06 DIAGNOSIS — C669 Malignant neoplasm of unspecified ureter: Secondary | ICD-10-CM | POA: Diagnosis not present

## 2016-08-13 DIAGNOSIS — D4959 Neoplasm of unspecified behavior of other genitourinary organ: Secondary | ICD-10-CM | POA: Diagnosis not present

## 2016-08-18 DIAGNOSIS — E785 Hyperlipidemia, unspecified: Secondary | ICD-10-CM | POA: Diagnosis not present

## 2016-08-18 DIAGNOSIS — I2510 Atherosclerotic heart disease of native coronary artery without angina pectoris: Secondary | ICD-10-CM | POA: Diagnosis not present

## 2016-08-18 DIAGNOSIS — R7309 Other abnormal glucose: Secondary | ICD-10-CM | POA: Diagnosis not present

## 2016-08-18 DIAGNOSIS — N182 Chronic kidney disease, stage 2 (mild): Secondary | ICD-10-CM | POA: Diagnosis not present

## 2016-08-18 DIAGNOSIS — I1310 Hypertensive heart and chronic kidney disease without heart failure, with stage 1 through stage 4 chronic kidney disease, or unspecified chronic kidney disease: Secondary | ICD-10-CM | POA: Diagnosis not present

## 2016-08-19 ENCOUNTER — Encounter (HOSPITAL_COMMUNITY): Payer: Self-pay | Admitting: Emergency Medicine

## 2016-08-19 ENCOUNTER — Emergency Department (HOSPITAL_COMMUNITY)
Admission: EM | Admit: 2016-08-19 | Discharge: 2016-08-20 | Disposition: A | Discharge status: Home / Self Care | Payer: Medicare Other | Attending: Emergency Medicine | Admitting: Emergency Medicine

## 2016-08-19 DIAGNOSIS — J449 Chronic obstructive pulmonary disease, unspecified: Secondary | ICD-10-CM | POA: Insufficient documentation

## 2016-08-19 DIAGNOSIS — Z7901 Long term (current) use of anticoagulants: Secondary | ICD-10-CM | POA: Diagnosis not present

## 2016-08-19 DIAGNOSIS — I714 Abdominal aortic aneurysm, without rupture: Secondary | ICD-10-CM | POA: Insufficient documentation

## 2016-08-19 DIAGNOSIS — I10 Essential (primary) hypertension: Secondary | ICD-10-CM | POA: Diagnosis not present

## 2016-08-19 DIAGNOSIS — R319 Hematuria, unspecified: Secondary | ICD-10-CM | POA: Insufficient documentation

## 2016-08-19 DIAGNOSIS — R339 Retention of urine, unspecified: Secondary | ICD-10-CM | POA: Insufficient documentation

## 2016-08-19 DIAGNOSIS — I739 Peripheral vascular disease, unspecified: Secondary | ICD-10-CM | POA: Insufficient documentation

## 2016-08-19 DIAGNOSIS — Z9889 Other specified postprocedural states: Secondary | ICD-10-CM | POA: Insufficient documentation

## 2016-08-19 DIAGNOSIS — Z79899 Other long term (current) drug therapy: Secondary | ICD-10-CM | POA: Diagnosis not present

## 2016-08-19 DIAGNOSIS — Z87891 Personal history of nicotine dependence: Secondary | ICD-10-CM | POA: Insufficient documentation

## 2016-08-19 DIAGNOSIS — Z8559 Personal history of malignant neoplasm of other urinary tract organ: Secondary | ICD-10-CM | POA: Diagnosis not present

## 2016-08-19 DIAGNOSIS — C669 Malignant neoplasm of unspecified ureter: Secondary | ICD-10-CM | POA: Diagnosis not present

## 2016-08-19 MED ORDER — CEFAZOLIN SODIUM-DEXTROSE 1-4 GM/50ML-% IV SOLN: 1.0000 g | INTRAVENOUS | Status: DC

## 2016-08-19 MED ORDER — LIDOCAINE HCL 2 % EX GEL
1.0000 "application " | Freq: Once | CUTANEOUS | Status: AC
  Administered 2016-08-19: 1 via URETHRAL
  Filled 2016-08-19: qty 11

## 2016-08-19 NOTE — ED Notes (Signed)
Patient was unable to void. The little drops that he was able to void was red/bloody.

## 2016-08-19 NOTE — ED Notes (Signed)
Pt from home with c/o bladder pain and pressure following a procedure today to remove a tumor from his ureter. Pt states the procedure was this morning and beginning at 1400 pt began having blood clots and difficulty urinating

## 2016-08-19 NOTE — ED Notes (Signed)
Writer and Lakema (NT) went to complete EKG and update vital signs- EKG and vitals delayed due to RN and MD in room inserting catheter.

## 2016-08-19 NOTE — ED Notes (Signed)
Ambulated patient to bathroom to try to void after bladder scanning patient.

## 2016-08-19 NOTE — ED Provider Notes (Signed)
[place] DEPT Provider Note   CSN: [id number] Arrival date & time: 08/19/16  2140     History   Chief Complaint Chief Complaint  Patient presents with  . Urinary Retention    HPI Douglas Monroe is a 83 y.o. male.  HPI Patient presents with urinary retention and hematuria. Last urinated around 12 hours ago. Had a procedure by Dr. Alyson Ingles earlier today. Reportedly difficulty getting a catheter in. States that Dr. Noah Delaine had sent someone for a "wire" but was able to get a catheter in. This evening his had some bleeding. Unable to around 12 hours and increased pain. He is on Plavix. Previous ureteral cancer. Past Medical History:  Diagnosis Date  . Abdominal aortic aneurysm ([place])    infrarenal -- monitored by dr berry (note states stable 4.0 to 4.1cm)  . Anxiety    situational  . Arthritis    "hands" (03/15/2016)  . Cancer of skin of temple 2017   right  . COPD with emphysema ([place])   . Dyspnea    with exertion  . Exertional dyspnea   . First degree heart block   . GERD (gastroesophageal reflux disease)   . Headache(784.0)    uses goody powder; "maybe weekly, maybe not that much" (03/15/2016)  . History of kidney stones   . HOH (hard of hearing)    left ear; no hearing aid   . HTN (hypertension) 08/20/2011  . Hyperlipidemia   . Hypertension   . Ischemic colitis ([place])   . Left carotid artery stenosis    moderate  . Migraine    "long time ago" (03/15/2016)  . 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 PVD (peripheral vascular disease) with claudication [facility]) cardiologist-  dr berry   s/p bilateral iliac stents and right sfa stenting/  doppper study 09-05-2012  revealed ABIs close to one bilaterally with patent stents/  stenting left renal artery stenosis   . Right ureteral stone   . S/P arterial stent    left renal angioplasty and stent 11-02-2012  . Sleep apnea    at risk for OSA. stop/bang score= 6; sent to PCP 10/04/13; denies use of  mask on 03/15/2016  . Tubular adenoma of colon     Patient Active Problem List   Diagnosis Date Noted  . Hydronephrosis 05/08/2016  . Acute renal failure superimposed on stage 2 chronic kidney disease ([place]) 05/08/2016  . Polyp of cecum   . Polyp of transverse colon   . BRBPR (bright red blood per rectum)   . Hematochezia   . Ischemic colitis ([place])   . Ulceration, colon   . Left sided abdominal pain   . Rectal bleeding 03/15/2016  . Acute hyperglycemia 03/15/2016  . COPD exacerbation ([place])   . Renal insufficiency   . PVD (peripheral vascular disease) ([place])   . Ureteral calculus 10/10/2013  . Hyperlipidemia 07/24/2013  . S/P arterial stent, to Lt renal art. 11/02/12 11/03/2012  . Carotid artery disease ([place]) 10/06/2012  . Renal artery stenosis, progression of disease 10/06/2012  . Abdominal aortic aneurysm ([place]) 10/06/2012  . Normal coronary arteries-  07/21/2012  . PVD (peripheral vascular disease) with claudication, previous stents to bilateral iliacs. Rt. SFA also.  08/20/2011  . Claudication of both lower extremities ([place]) 08/20/2011  . PVD S/P multiple PCIs 08/20/2011  . COPD (chronic obstructive pulmonary disease) ([place]) 08/20/2011  . HTN (hypertension) 08/20/2011    Past Surgical History:  Procedure Laterality Date  . ABDOMINAL ANGIOGRAM  08/19/2011   Left common iliac artery at the ostium, 6x49mm ICAST covered stent, common femoral artery, 8x152mm Abbott absolute pro nitinol self-expanding stent and resulting in reduction of 80 and 90% stenosis to 0% residual  . ABDOMINAL ANGIOGRAM  10/08/2010   Mid-right SFA, 6x120 EV3 Protege nitinol self-expanding stent, resulting in reduction of CTO to 0% residual  . ABDOMINAL ANGIOGRAM  06/22/2010   Rt Common Femoral Artery-8x3 Absolute Pro Nitinol self-expanding stent-90% stenosis to 0% residual; Rt External Iliac Artery-9x3 Absolute Pro Nitinol self-expanding stent-70% stenosis to 0% residual; Left External Iliac Artery-8x4 Absolute Pro  Nitinol self-expanding stent-90% to 0% residual  . ATHERECTOMY N/A 08/19/2011   Procedure: ATHERECTOMY;  Surgeon: Lorretta Harp, MD;  Location[facility];  Service: Cardiovascular;  Laterality: N/A;  . BACK SURGERY    . CARDIAC CATHETERIZATION  10/26/2002  dr Tami Ribas   normal coronary arteries/ normal  LVF  . CARDIOVASCULAR STRESS TEST  06-12-2010   dr berry   normal perfusion study/ no ischemia/  ef 69%  . COLONOSCOPY WITH PROPOFOL N/A 03/19/2016   Procedure: COLONOSCOPY WITH PROPOFOL;  Surgeon: Mauri Pole, MD;  Location: [facility];  Service: Endoscopy;  Laterality: N/A;  was inpatient, but got d/c'd home and is coming back as an OP  . CYSTOSCOPY W/ URETERAL STENT PLACEMENT Left 06/07/2016   Procedure: CYSTOSCOPY WITH RETROGRADE PYELOGRAM/URETERAL STENT REPLACEMENT;  Surgeon: Cleon Gustin, MD;  Location: [facility];  Service: Urology;  Laterality: Left;  . CYSTOSCOPY WITH RETROGRADE PYELOGRAM, URETEROSCOPY AND STENT PLACEMENT Right 10/10/2013   Procedure: uretheral dilation, CYSTOSCOPY, URETEROSCOPY, stone extraction, STENT PLACEMENT;  Surgeon: [name], MD;  Location: [facility];  Service: Urology;  Laterality: Right;  . CYSTOSCOPY WITH URETEROSCOPY Left 05/09/2016   Procedure: CYSTOSCOPY, RETROGRADE, LEFT URETEROSCOPY, LEFT URETERAL BIOPSIES, HOLMIUM LASER FULGERATION, LEFT URETERAL STENT PLACEMENT;  Surgeon: Irine Seal, MD;  Location: [facility];  Service: Urology;  Laterality: Left;  . HOLMIUM LASER APPLICATION Right 04/01/4968   Procedure: HOLMIUM LASER APPLICATION;  Surgeon: [name], MD;  Location: [facility];  Service: Urology;  Laterality: Right;  . HOLMIUM LASER APPLICATION Left 03/10/3783   Procedure: TUMOR ABLATION WITH HOLMIUM LASER;  Surgeon: Cleon Gustin, MD;  Location: [facility];  Service: Urology;  Laterality: Left;  . LUMBAR DISC SURGERY  1970's  . RENAL ANGIOGRAM N/A 11/02/2012   Procedure: RENAL ANGIOGRAM;  Surgeon: Lorretta Harp, MD;  Location: [facility] CATH LAB;  Service: Cardiovascular;  Laterality: N/A;  . RENAL ARTERY STENT Left 11/02/2012  . SKIN CANCER EXCISION Right 2017   temple  . SOFT TISSUE CYST EXCISION  1960's   "outside part of lung LLL"       Home Medications    Prior to Admission medications   Medication Sig Start Date End Date Taking? Authorizing Provider  albuterol (PROVENTIL HFA;VENTOLIN HFA) 108 (90 BASE) MCG/ACT inhaler Inhale 2 puffs into the lungs every 6 (six) hours as needed for shortness of breath.    Yes [provider]  amLODipine (NORVASC) 5 MG tablet Take 5 mg by mouth daily.    Yes [provider]  atorvastatin (LIPITOR) 20 MG tablet TAKE 1 TABLET BY MOUTH  DAILY AT 6 PM. Patient taking differently: Take 20mg  by mouth daily 01/14/16  Yes Lorretta Harp, MD  BYSTOLIC 10 MG tablet Take 10 mg by mouth daily. 02/23/16  Yes [provider]  clopidogrel (PLAVIX) 75 MG tablet Take 75 mg by  mouth daily.   Yes [provider]  [employer] D3 2000 units CAPS Take 2,000 Units by mouth daily.  01/31/16  Yes [provider]  dextromethorphan (DELSYM) 30 MG/5ML liquid Take 30 mg by mouth as needed for cough.   Yes [provider]  finasteride (PROSCAR) 5 MG tablet Take 5 mg by mouth every morning.   Yes [provider]  pantoprazole (PROTONIX) 40 MG tablet Take 40 mg by mouth daily. 01/21/16  Yes [provider]  phenazopyridine (PYRIDIUM) 95 MG tablet Take 95 mg by mouth 3 (three) times daily as needed for pain.   Yes [provider]  tamsulosin (FLOMAX) 0.4 MG CAPS capsule Take 0.4 mg by mouth daily.  07/13/13  Yes [provider]  tiotropium (SPIRIVA) 18 MCG inhalation capsule Place 18 mcg into inhaler and inhale daily.   Yes [provider]  oxyCODONE-acetaminophen (PERCOCET/ROXICET) 5-325 MG tablet Take 1 tablet by mouth every 4 (four) hours as needed for moderate pain. Patient not taking: Reported  on 08/19/2016 06/07/16   Cleon Gustin, MD    Family History Family History  Problem Relation Age of Onset  . Hypertension Mother   . Cancer Father   . Diabetes Brother   . Hypertension Son     Social History Social History  Substance Use Topics  . Smoking status: Former Smoker    Packs/day: 1.00    Years: 60.00    Types: Cigarettes    Quit date: 06/02/2010  . Smokeless tobacco: Never Used  . Alcohol use No     Allergies   Pneumococcal vaccines   Review of Systems Review of Systems  Constitutional: Negative for appetite change.  HENT: Negative for congestion.   Respiratory: Negative for shortness of breath.   Gastrointestinal: Positive for abdominal pain.  Genitourinary: Positive for difficulty urinating and hematuria.  Musculoskeletal: Negative for back pain.  Skin: Negative for wound.  Neurological: Negative for weakness.  Hematological: Negative for adenopathy.     Physical Exam Updated Vital Signs BP 132/75 (BP Location: Left Arm)   Pulse 84   Temp 98.7 F (37.1 C) (Oral)   Resp 15   SpO2 94%   Physical Exam  Constitutional: He appears well-developed.  Patient appears uncomfortable  HENT:  Head: Atraumatic.  Eyes: Pupils are equal, round, and reactive to light.  Neck: Neck supple.  Pulmonary/Chest: Effort normal.  Abdominal: He exhibits mass.  Suprapubic tenderness and mass.  Genitourinary:  Genitourinary Comments: Some blood at meatus. Uncircumcised.  Neurological: He is alert.  Skin: Skin is warm. Capillary refill takes less than 2 seconds.  Psychiatric: He has a normal mood and affect.     ED Treatments / Results  Labs (all labs ordered are listed, but only abnormal results are displayed) Labs Reviewed - No data to display  EKG  EKG Interpretation None       Radiology No results found.  Procedures BLADDER CATHETERIZATION Date/Time: 08/19/2016 11:42 PM Performed by: Davonna Belling Authorized by: Davonna Belling    Consent:    Consent obtained:  Verbal and emergent situation   Consent given by:  Patient   Risks discussed:  False passage, infection, urethral injury, incomplete procedure and pain   Alternatives discussed:  No treatment, alternative treatment and delayed treatment Pre-procedure details:    Procedure purpose:  Therapeutic   Preparation: Patient was prepped and draped in usual sterile fashion   Anesthesia (see MAR for exact dosages):    Anesthesia method:  Topical application  Topical anesthetic:  Lidocaine gel Procedure details:    Provider performed due to:  Complicated insertion and nurse unable to complete   Catheter insertion:  Temporary indwelling   Catheter type:  Coude   Catheter size:  18 Fr   Bladder irrigation: yes     Number of attempts:  2   Urine characteristics:  Bloody Post-procedure details:    Patient tolerance of procedure:  Tolerated well, no immediate complications   (including critical care time)  Medications Ordered in ED Medications  ceFAZolin (ANCEF) IVPB 1 g/50 mL premix (1 g Intravenous Not Given 08/19/16 2346)  lidocaine (XYLOCAINE) 2 % jelly 1 application (1 application Urethral Given 08/19/16 2342)     Initial Impression / Assessment and Plan / ED Course  I have reviewed the triage vital signs and the nursing notes.  Pertinent labs & imaging results that were available during my care of the patient were reviewed by me and considered in my medical decision making (see chart for details).     Patient with hematuria after procedure. Urinary retention. Feels better after Foley catheter. Will discharge home follow-up with urology.  Final Clinical Impressions(s) / ED Diagnoses   Final diagnoses:  Urinary retention  Hematuria, unspecified type    New Prescriptions New Prescriptions   No medications on file     Davonna Belling, MD 08/20/16 [phone]

## 2016-08-19 NOTE — ED Notes (Signed)
RN informed Probation officer verbally that orders for EKG and labs can be disregarded.

## 2016-08-20 LAB — I-STAT CHEM 8, ED
BUN: 18 mg/dL (ref 6–20)
CALCIUM ION: 1.2 mmol/L (ref 1.15–1.40)
CHLORIDE: 102 mmol/L (ref 101–111)
Creatinine, Ser: 1 mg/dL (ref 0.61–1.24)
Glucose, Bld: 143 mg/dL — ABNORMAL HIGH (ref 65–99)
HCT: 40 % (ref 39.0–52.0)
HEMOGLOBIN: 13.6 g/dL (ref 13.0–17.0)
POTASSIUM: 4 mmol/L (ref 3.5–5.1)
SODIUM: 141 mmol/L (ref 135–145)
TCO2: 25 mmol/L (ref 0–100)

## 2016-08-20 NOTE — ED Provider Notes (Signed)
Pt improved Urine is flowing without obstruction HGB stable Will d/c home Advised to call urology today    Ripley Fraise, MD 08/20/16 [phone]

## 2016-08-20 NOTE — ED Notes (Signed)
Foley catheter gently irrigated with NS  Large amount of small clots noted on return  During irrigation pt was passing fluid around the catheter  Notified Dr Alvino Chapel  No further orders received at this time  Will continue to monitor catheter for urine return

## 2016-08-20 NOTE — ED Notes (Signed)
Pt educated on use of leg bag  Pt shown on opposite leg how to put on and take off a leg bag. Pt attached to leg bag Pt verbalized wants to have foley bag instead and bag drained and placed back on. Pt wanted new gown and urinal ( other items, extra leg bag, gloves. Pt cleaned and shown foley care, empty, clean,  Pt educated on fall risks and told to hold bag properly.

## 2016-08-20 NOTE — ED Notes (Signed)
Pt being observed.for proper drainage

## 2016-08-23 DIAGNOSIS — R338 Other retention of urine: Secondary | ICD-10-CM | POA: Diagnosis not present

## 2016-08-23 DIAGNOSIS — C669 Malignant neoplasm of unspecified ureter: Secondary | ICD-10-CM | POA: Diagnosis not present

## 2016-08-26 DIAGNOSIS — J441 Chronic obstructive pulmonary disease with (acute) exacerbation: Secondary | ICD-10-CM | POA: Diagnosis not present

## 2016-08-27 DIAGNOSIS — Z5111 Encounter for antineoplastic chemotherapy: Secondary | ICD-10-CM | POA: Diagnosis not present

## 2016-08-27 DIAGNOSIS — C662 Malignant neoplasm of left ureter: Secondary | ICD-10-CM | POA: Diagnosis not present

## 2016-09-03 DIAGNOSIS — C669 Malignant neoplasm of unspecified ureter: Secondary | ICD-10-CM | POA: Diagnosis not present

## 2016-09-03 DIAGNOSIS — Z5111 Encounter for antineoplastic chemotherapy: Secondary | ICD-10-CM | POA: Diagnosis not present

## 2016-09-13 ENCOUNTER — Other Ambulatory Visit: Payer: Self-pay | Admitting: Urology

## 2016-09-15 ENCOUNTER — Encounter (HOSPITAL_COMMUNITY): Payer: Self-pay

## 2016-09-15 ENCOUNTER — Encounter (HOSPITAL_COMMUNITY)
Admission: RE | Admit: 2016-09-15 | Discharge: 2016-09-15 | Disposition: A | Discharge status: Home / Self Care | Payer: Medicare Other | Source: Ambulatory Visit | Attending: Urology | Admitting: Urology

## 2016-09-15 DIAGNOSIS — D4959 Neoplasm of unspecified behavior of other genitourinary organ: Secondary | ICD-10-CM | POA: Insufficient documentation

## 2016-09-15 DIAGNOSIS — Z01812 Encounter for preprocedural laboratory examination: Secondary | ICD-10-CM | POA: Diagnosis not present

## 2016-09-15 LAB — BASIC METABOLIC PANEL
Anion gap: 7 (ref 5–15)
BUN: 21 mg/dL — AB (ref 6–20)
CHLORIDE: 110 mmol/L (ref 101–111)
CO2: 26 mmol/L (ref 22–32)
CREATININE: 1.12 mg/dL (ref 0.61–1.24)
Calcium: 9.4 mg/dL (ref 8.9–10.3)
GFR calc Af Amer: >60 mL/min (ref >60)
GFR calc non Af Amer: 60 mL/min — ABNORMAL LOW (ref >60)
GLUCOSE: 109 mg/dL — AB (ref 65–99)
Potassium: 4.3 mmol/L (ref 3.5–5.1)
SODIUM: 143 mmol/L (ref 135–145)

## 2016-09-15 LAB — CBC
HEMATOCRIT: 41.6 % (ref 39.0–52.0)
HEMOGLOBIN: 13.6 g/dL (ref 13.0–17.0)
MCH: 30.2 pg (ref 26.0–34.0)
MCHC: 32.7 g/dL (ref 30.0–36.0)
MCV: 92.2 fL (ref 78.0–100.0)
Platelets: 174 10*3/uL (ref 150–400)
RBC: 4.51 MIL/uL (ref 4.22–5.81)
RDW: 14.2 % (ref 11.5–15.5)
WBC: 7.7 10*3/uL (ref 4.0–10.5)

## 2016-09-15 NOTE — Patient Instructions (Signed)
HERSHEL CORKERY  09/15/2016   Your procedure is scheduled on: 09-20-16  Report to [facility] Main  Entrance Take [facility]  elevators to 3rd floor to  [place] at 11:15AM.   Call this number if you have problems the morning of surgery [phone]   Remember: ONLY 1 PERSON MAY GO WITH YOU TO SHORT STAY TO GET  READY MORNING OF [place].  Do not eat food After Midnight. You may have clear liquids from midnight until 715am day of surgery. Nothing by mouth after 715am!     Take these medicines the morning of surgery with A SIP OF WATER: amlodipine(norvasc), atorvastatin(lipitor), finasteride(proscar), tamsulosin(flomax), pantoprazole(protonix), , inhalers as needed (may bring to hospital with you)                                 You may not have any metal on your body including hair pins and              piercings  Do not wear jewelry, make-up, lotions, powders or perfumes, deodorant              Men may shave face and neck.   Do not bring valuables to the hospital. [place].  Contacts, dentures or bridgework may not be worn into surgery.     Patients discharged the day of surgery will not be allowed to drive home.  Name and phone number of your driver:  Special Instructions: N/A              Please read over the following fact sheets you were given: _____________________________________________________________________   CLEAR LIQUID DIET   Foods Allowed                                                                     Foods Excluded  Coffee and tea, regular and decaf                             liquids that you cannot  Plain Jell-O in any flavor                                             see through such as: Fruit ices (not with fruit pulp)                                     milk, soups, orange juice  Iced Popsicles                                    All solid food Carbonated beverages,  regular and diet  Cranberry, grape and apple juices Sports drinks like Gatorade Lightly seasoned clear broth or consume(fat free) Sugar, honey syrup  Sample Menu Breakfast                                Lunch                                     Supper Cranberry juice                    Beef broth                            Chicken broth Jell-O                                     Grape juice                           Apple juice Coffee or tea                        Jell-O                                      Popsicle                                                Coffee or tea                        Coffee or tea  _____________________________________________________________________  [facility] - Preparing for Surgery Before surgery, you can play an important role.  Because skin is not sterile, your skin needs to be as free of germs as possible.  You can reduce the number of germs on your skin by washing with CHG (chlorahexidine gluconate) soap before surgery.  CHG is an antiseptic cleaner which kills germs and bonds with the skin to continue killing germs even after washing. Please DO NOT use if you have an allergy to CHG or antibacterial soaps.  If your skin becomes reddened/irritated stop using the CHG and inform your nurse when you arrive at Short Stay. Do not shave (including legs and underarms) for at least 48 hours prior to the first CHG shower.  You may shave your face/neck. Please follow these instructions carefully:  1.  Shower with CHG Soap the night before surgery and the  morning of Surgery.  2.  If you choose to wash your hair, wash your hair first as usual with your  normal  shampoo.  3.  After you shampoo, rinse your hair and body thoroughly to remove the  shampoo.                           4.  Use CHG as you would any other liquid soap.  You can apply chg directly  to the skin and wash  Gently with a scrungie or clean  washcloth.  5.  Apply the CHG Soap to your body ONLY FROM THE NECK DOWN.   Do not use on face/ open                           Wound or open sores. Avoid contact with eyes, ears mouth and genitals (private parts).                       Wash face,  Genitals (private parts) with your normal soap.             6.  Wash thoroughly, paying special attention to the area where your surgery  will be performed.  7.  Thoroughly rinse your body with warm water from the neck down.  8.  DO NOT shower/wash with your normal soap after using and rinsing off  the CHG Soap.                9.  Pat yourself dry with a clean towel.            10.  Wear clean pajamas.            11.  Place clean sheets on your bed the night of your first shower and do not  sleep with pets. Day of Surgery : Do not apply any lotions/deodorants the morning of surgery.  Please wear clean clothes to the hospital/surgery center.  FAILURE TO FOLLOW THESE INSTRUCTIONS MAY RESULT IN THE CANCELLATION OF YOUR SURGERY PATIENT SIGNATURE_________________________________  NURSE SIGNATURE__________________________________  ________________________________________________________________________

## 2016-09-15 NOTE — Progress Notes (Signed)
EKG 08-19-16 epic/chart  LOV cardiology Dr Gwenlyn Found 04-27-16 epic   PVD (peripheral vascular disease) with claudication, previous stents to bilateral iliacs. Rt. SFA also. His most recent lower extremity Dopplers performed 04/23/16 revealed patent iliac stents with essentially normal ABIs. We'll continue to follow this on an annual basis.  Abdominal aortic aneurysm (HCC) History of abdominal aortic aneurysm with recent Doppler revealing this to be stable. This is followed on an annual basis  Dopplers performed 09/12/12 didn't show a progression of the left renal artery stenosis  VAS [place] AAA DUPLEX COMPLETE 04-23-16 on chart   "no change from prior study "  CXR 03-15-16 epic  CT ANGIOGRAPHY ABDOMEN AND PELVIS WITH CONTRAST AND WITHOUT CONTRAST 04-20-13 epic   Left renal artery stent appears patent. Minimal narrowing of the vessel at the distal end of the stent does not appear to be hemodynamically significant. 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 3.5 cm infrarenal abdominal aortic aneurysm with extensive calcifications and mural plaque, stable.

## 2016-09-19 NOTE — Anesthesia Preprocedure Evaluation (Signed)
Anesthesia Evaluation  Patient identified by MRN, date of birth, ID band Patient awake    Reviewed: Allergy & Precautions, NPO status , Patient's Chart, lab work & pertinent test results, reviewed documented beta blocker date and time   Airway Mallampati: I       Dental  (+) Edentulous Upper, Edentulous Lower   Pulmonary sleep apnea , COPD,  COPD inhaler, former smoker,    Pulmonary exam normal breath sounds clear to auscultation       Cardiovascular hypertension, Pt. on medications and Pt. on home beta blockers + Peripheral Vascular Disease (s/p B/L iliac stents)  Normal cardiovascular exam Rhythm:Regular Rate:Normal     Neuro/Psych  Headaches, PSYCHIATRIC DISORDERS Anxiety    GI/Hepatic Neg liver ROS, GERD  Medicated,  Endo/Other  negative endocrine ROS  Renal/GU Renal hypertensionRenal disease     Musculoskeletal  (+) Arthritis , Osteoarthritis,    Abdominal   Peds  Hematology  (+) Blood dyscrasia (Plavix), ,   Anesthesia Other Findings Day of surgery medications reviewed with the patient.  Reproductive/Obstetrics                            Anesthesia Physical  Anesthesia Plan  ASA: III  Anesthesia Plan: Spinal   Post-op Pain Management:  Regional for Post-op pain   Induction:   PONV Risk Score and Plan: 2 and Ondansetron, Dexamethasone, Treatment may vary due to age or medical condition and Propofol infusion  Airway Management Planned: Natural Airway and Nasal Cannula  Additional Equipment:   Intra-op Plan:   Post-operative Plan: Extubation in OR  Informed Consent: I have reviewed the patients History and Physical, chart, labs and discussed the procedure including the risks, benefits and alternatives for the proposed anesthesia with the patient or authorized representative who has indicated his/her understanding and acceptance.   Dental advisory given  Plan Discussed  with: CRNA  Anesthesia Plan Comments: (Risks/benefits of general anesthesia discussed with patient including risk of damage to teeth, lips, gum, and tongue, nausea/vomiting, allergic reactions to medications, and the possibility of heart attack, stroke and death.  All patient questions answered.  Patient wishes to proceed.)       Anesthesia Quick Evaluation

## 2016-09-20 ENCOUNTER — Ambulatory Visit (HOSPITAL_COMMUNITY)
Admission: RE | Admit: 2016-09-20 | Discharge: 2016-09-20 | Disposition: A | Discharge status: Home / Self Care | Payer: Medicare Other | Source: Ambulatory Visit | Attending: Urology | Admitting: Urology

## 2016-09-20 ENCOUNTER — Encounter (HOSPITAL_COMMUNITY)
Admission: RE | Disposition: A | Discharge status: Home / Self Care | Payer: Self-pay | Source: Ambulatory Visit | Attending: Urology

## 2016-09-20 ENCOUNTER — Ambulatory Visit (HOSPITAL_COMMUNITY): Payer: Medicare Other

## 2016-09-20 ENCOUNTER — Encounter (HOSPITAL_COMMUNITY): Payer: Self-pay | Admitting: *Deleted

## 2016-09-20 ENCOUNTER — Ambulatory Visit (HOSPITAL_COMMUNITY): Payer: Medicare Other | Admitting: Anesthesiology

## 2016-09-20 DIAGNOSIS — J449 Chronic obstructive pulmonary disease, unspecified: Secondary | ICD-10-CM | POA: Diagnosis not present

## 2016-09-20 DIAGNOSIS — Z87891 Personal history of nicotine dependence: Secondary | ICD-10-CM | POA: Diagnosis not present

## 2016-09-20 DIAGNOSIS — M1990 Unspecified osteoarthritis, unspecified site: Secondary | ICD-10-CM | POA: Diagnosis not present

## 2016-09-20 DIAGNOSIS — Z8249 Family history of ischemic heart disease and other diseases of the circulatory system: Secondary | ICD-10-CM | POA: Diagnosis not present

## 2016-09-20 DIAGNOSIS — D4959 Neoplasm of unspecified behavior of other genitourinary organ: Secondary | ICD-10-CM | POA: Diagnosis not present

## 2016-09-20 DIAGNOSIS — Z9889 Other specified postprocedural states: Secondary | ICD-10-CM | POA: Diagnosis not present

## 2016-09-20 DIAGNOSIS — Z87442 Personal history of urinary calculi: Secondary | ICD-10-CM | POA: Insufficient documentation

## 2016-09-20 DIAGNOSIS — Z887 Allergy status to serum and vaccine status: Secondary | ICD-10-CM | POA: Diagnosis not present

## 2016-09-20 DIAGNOSIS — Z86010 Personal history of colonic polyps: Secondary | ICD-10-CM | POA: Diagnosis not present

## 2016-09-20 DIAGNOSIS — K219 Gastro-esophageal reflux disease without esophagitis: Secondary | ICD-10-CM | POA: Insufficient documentation

## 2016-09-20 DIAGNOSIS — Z8719 Personal history of other diseases of the digestive system: Secondary | ICD-10-CM | POA: Diagnosis not present

## 2016-09-20 DIAGNOSIS — Z833 Family history of diabetes mellitus: Secondary | ICD-10-CM | POA: Diagnosis not present

## 2016-09-20 DIAGNOSIS — Z95820 Peripheral vascular angioplasty status with implants and grafts: Secondary | ICD-10-CM | POA: Insufficient documentation

## 2016-09-20 DIAGNOSIS — G4730 Sleep apnea, unspecified: Secondary | ICD-10-CM | POA: Insufficient documentation

## 2016-09-20 DIAGNOSIS — N131 Hydronephrosis with ureteral stricture, not elsewhere classified: Secondary | ICD-10-CM

## 2016-09-20 DIAGNOSIS — D3021 Benign neoplasm of right ureter: Secondary | ICD-10-CM | POA: Insufficient documentation

## 2016-09-20 DIAGNOSIS — E785 Hyperlipidemia, unspecified: Secondary | ICD-10-CM | POA: Diagnosis not present

## 2016-09-20 DIAGNOSIS — I10 Essential (primary) hypertension: Secondary | ICD-10-CM | POA: Diagnosis not present

## 2016-09-20 DIAGNOSIS — Z79899 Other long term (current) drug therapy: Secondary | ICD-10-CM | POA: Diagnosis not present

## 2016-09-20 DIAGNOSIS — C661 Malignant neoplasm of right ureter: Secondary | ICD-10-CM | POA: Diagnosis not present

## 2016-09-20 DIAGNOSIS — N201 Calculus of ureter: Secondary | ICD-10-CM | POA: Diagnosis not present

## 2016-09-20 DIAGNOSIS — Z7902 Long term (current) use of antithrombotics/antiplatelets: Secondary | ICD-10-CM | POA: Insufficient documentation

## 2016-09-20 DIAGNOSIS — Z85828 Personal history of other malignant neoplasm of skin: Secondary | ICD-10-CM | POA: Insufficient documentation

## 2016-09-20 DIAGNOSIS — I739 Peripheral vascular disease, unspecified: Secondary | ICD-10-CM | POA: Insufficient documentation

## 2016-09-20 DIAGNOSIS — I714 Abdominal aortic aneurysm, without rupture: Secondary | ICD-10-CM | POA: Insufficient documentation

## 2016-09-20 DIAGNOSIS — H9192 Unspecified hearing loss, left ear: Secondary | ICD-10-CM | POA: Diagnosis not present

## 2016-09-20 DIAGNOSIS — N182 Chronic kidney disease, stage 2 (mild): Secondary | ICD-10-CM | POA: Diagnosis not present

## 2016-09-20 DIAGNOSIS — I129 Hypertensive chronic kidney disease with stage 1 through stage 4 chronic kidney disease, or unspecified chronic kidney disease: Secondary | ICD-10-CM | POA: Diagnosis not present

## 2016-09-20 HISTORY — PX: CYSTOSCOPY W/ URETERAL STENT PLACEMENT: SHX1429

## 2016-09-20 HISTORY — PX: CYSTOSCOPY/URETEROSCOPY/HOLMIUM LASER: SHX6545

## 2016-09-20 SURGERY — CYSTOSCOPY, WITH RETROGRADE PYELOGRAM AND URETERAL STENT INSERTION
Anesthesia: Spinal | Site: Ureter | Laterality: Left

## 2016-09-20 MED ORDER — FENTANYL CITRATE (PF) 100 MCG/2ML IJ SOLN
INTRAMUSCULAR | Status: DC | PRN
  Administered 2016-09-20: 50 ug via INTRAVENOUS

## 2016-09-20 MED ORDER — PROPOFOL 10 MG/ML IV BOLUS
INTRAVENOUS | Status: AC
  Filled 2016-09-20: qty 40

## 2016-09-20 MED ORDER — PROPOFOL 10 MG/ML IV BOLUS
INTRAVENOUS | Status: DC | PRN
  Administered 2016-09-20: 140 mg via INTRAVENOUS

## 2016-09-20 MED ORDER — LACTATED RINGERS IV SOLN
INTRAVENOUS | Status: DC
Start: 2016-09-20 — End: 2016-09-20

## 2016-09-20 MED ORDER — OXYCODONE-ACETAMINOPHEN 5-325 MG PO TABS: 1.0000 | ORAL_TABLET | ORAL | 0 refills | Status: DC | PRN

## 2016-09-20 MED ORDER — FENTANYL CITRATE (PF) 100 MCG/2ML IJ SOLN
INTRAMUSCULAR | Status: AC
  Filled 2016-09-20: qty 2

## 2016-09-20 MED ORDER — LACTATED RINGERS IV SOLN
INTRAVENOUS | Status: DC
  Administered 2016-09-20 (×3): via INTRAVENOUS

## 2016-09-20 MED ORDER — DEXAMETHASONE SODIUM PHOSPHATE 10 MG/ML IJ SOLN
INTRAMUSCULAR | Status: DC | PRN
  Administered 2016-09-20: 10 mg via INTRAVENOUS

## 2016-09-20 MED ORDER — LIDOCAINE 2% (20 MG/ML) 5 ML SYRINGE
INTRAMUSCULAR | Status: DC | PRN
  Administered 2016-09-20: 100 mg via INTRAVENOUS

## 2016-09-20 MED ORDER — ONDANSETRON HCL 4 MG/2ML IJ SOLN
INTRAMUSCULAR | Status: DC | PRN
  Administered 2016-09-20: 4 mg via INTRAVENOUS

## 2016-09-20 MED ORDER — LIDOCAINE 2% (20 MG/ML) 5 ML SYRINGE
INTRAMUSCULAR | Status: AC
  Filled 2016-09-20: qty 5

## 2016-09-20 MED ORDER — CEFAZOLIN SODIUM-DEXTROSE 2-4 GM/100ML-% IV SOLN
2.0000 g | INTRAVENOUS | Status: AC
  Administered 2016-09-20: 2 g via INTRAVENOUS
  Filled 2016-09-20: qty 100

## 2016-09-20 MED ORDER — SODIUM CHLORIDE 0.9 % IR SOLN
Status: DC | PRN
  Administered 2016-09-20: 1 via INTRAVESICAL

## 2016-09-20 MED ORDER — IOPAMIDOL (ISOVUE-300) INJECTION 61%
INTRAVENOUS | Status: DC | PRN
  Administered 2016-09-20: 10 mL

## 2016-09-20 SURGICAL SUPPLY — 30 items
BAG URO CATCHER STRL LF (MISCELLANEOUS) ×4 IMPLANT
BASKET DAKOTA 1.9FR 11X120 (BASKET) IMPLANT
BASKET LASER NITINOL 1.9FR (BASKET) IMPLANT
BSKT STON RTRVL 120 1.9FR (BASKET)
CABLE HIGH FREQUENCY MONO STRZ (ELECTRODE) ×2 IMPLANT
CATH FOLEY LATEX FREE 20FR (CATHETERS)
CATH FOLEY LF 20FR (CATHETERS) IMPLANT
CATH INTERMIT  6FR 70CM (CATHETERS) ×4 IMPLANT
CLOTH BEACON ORANGE TIMEOUT ST (SAFETY) ×4 IMPLANT
COVER FOOT SWITCH UNIV DISP (DRAPES) IMPLANT
COVER SURGICAL LIGHT HANDLE (MISCELLANEOUS) ×4 IMPLANT
ELECT COAG BALL END 3FR (ELECTROSURGICAL) ×4
ELECTRODE COAG BALL END 3FR (ELECTROSURGICAL) IMPLANT
EXTRACTOR STONE NITINOL NGAGE (UROLOGICAL SUPPLIES) IMPLANT
FIBER LASER TRAC TIP (UROLOGICAL SUPPLIES) ×2 IMPLANT
GLOVE BIO SURGEON STRL SZ8 (GLOVE) ×4 IMPLANT
GOWN STRL REUS W/TWL XL LVL3 (GOWN DISPOSABLE) ×4 IMPLANT
GUIDEWIRE ANG ZIPWIRE 038X150 (WIRE) ×4 IMPLANT
GUIDEWIRE STR DUAL SENSOR (WIRE) ×4 IMPLANT
IV NS 1000ML (IV SOLUTION) ×4
IV NS 1000ML BAXH (IV SOLUTION) ×2 IMPLANT
MANIFOLD NEPTUNE II (INSTRUMENTS) ×4 IMPLANT
PACK CYSTO (CUSTOM PROCEDURE TRAY) ×4 IMPLANT
SHEATH ACCESS URETERAL 38CM (SHEATH) IMPLANT
STENT CONTOUR 6FRX26X.038 (STENTS) ×2 IMPLANT
SYR CONTROL 10ML LL (SYRINGE) IMPLANT
SYRINGE IRR TOOMEY STRL 70CC (SYRINGE) IMPLANT
TUBE FEEDING 8FR 16IN STR KANG (MISCELLANEOUS) IMPLANT
TUBING CONNECTING 10 (TUBING) ×3 IMPLANT
TUBING CONNECTING 10' (TUBING) ×1

## 2016-09-20 NOTE — Anesthesia Procedure Notes (Signed)
Procedure Name: LMA Insertion Date/Time: 09/20/2016 1:21 PM Performed by: Lind Covert Pre-anesthesia Checklist: Patient identified, Emergency Drugs available, Suction available and Patient being monitored Patient Re-evaluated:Patient Re-evaluated prior to induction Oxygen Delivery Method: Circle system utilized Preoxygenation: Pre-oxygenation with 100% oxygen Induction Type: IV induction Ventilation: Mask ventilation without difficulty LMA: LMA inserted LMA Size: 4.0 Number of attempts: 1 Placement Confirmation: positive ETCO2 and breath sounds checked- equal and bilateral Dental Injury: Teeth and Oropharynx as per pre-operative assessment

## 2016-09-20 NOTE — Anesthesia Postprocedure Evaluation (Signed)
Anesthesia Post Note  Patient: Douglas Monroe  Procedure(s) Performed: Procedure(s) (LRB): CYSTOSCOPY WITH RETROGRADE PYELOGRAM/URETERAL STENT PLACEMENT (Left) CYSTOSCOPY/URETEROSCOPY/HOLMIUM LASER (Left)     Patient location during evaluation: PACU Anesthesia Type: Spinal Level of consciousness: oriented and awake and alert Pain management: pain level controlled Vital Signs Assessment: post-procedure vital signs reviewed and stable Respiratory status: spontaneous breathing, respiratory function stable and patient connected to nasal cannula oxygen Cardiovascular status: blood pressure returned to baseline and stable Postop Assessment: no headache and no backache Anesthetic complications: no    Last Vitals:  Vitals:   09/20/16 1409 09/20/16 1415  BP: 127/60   Pulse: (!) 53 (!) 56  Resp: 18 (!) 21  Temp: (!) 36.4 C   SpO2: 100% 100%    Last Pain:  Vitals:   09/20/16 1409  TempSrc:   PainSc: Asleep                 Douglas Monroe

## 2016-09-20 NOTE — H&P (Signed)
Urology Admission H&P  Chief Complaint: left ureteral tumor  History of Present Illness: Mr Douglas Monroe is a 83yo with a hx of left high grade ureteral cancer here for repeat ureterosocpy  Past Medical History:  Diagnosis Date  . Abdominal aortic aneurysm ([place])    infrarenal -- monitored by dr berry (note states stable 4.0 to 4.1cm)  . Anxiety    situational  . Arthritis    "hands" (03/15/2016)  . Cancer of skin of temple 2017   right  . COPD with emphysema ([place])   . Dyspnea    with exertion  . Exertional dyspnea   . First degree heart block   . GERD (gastroesophageal reflux disease)   . Headache(784.0)    uses goody powder; "maybe weekly, maybe not that much" (03/15/2016)  . History of kidney stones   . HOH (hard of hearing)    left ear; no hearing aid   . HTN (hypertension) 08/20/2011  . Hyperlipidemia   . Hypertension   . Ischemic colitis ([place])   . Left carotid artery stenosis    moderate  . Migraine    "long time ago" (03/15/2016)  . 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 PVD (peripheral vascular disease) with claudication [facility]) cardiologist-  dr berry   s/p bilateral iliac stents and right sfa stenting/  doppper study 09-05-2012  revealed ABIs close to one bilaterally with patent stents/  stenting left renal artery stenosis   . Right ureteral stone   . S/P arterial stent    left renal angioplasty and stent 11-02-2012  . Sleep apnea    at risk for OSA. stop/bang score= 6; sent to PCP 10/04/13; denies use of mask on 03/15/2016  . Tubular adenoma of colon    Past Surgical History:  Procedure Laterality Date  . ABDOMINAL ANGIOGRAM  08/19/2011   Left common iliac artery at the ostium, 6x72mm ICAST covered stent, common femoral artery, 8x161mm Abbott absolute pro nitinol self-expanding stent and resulting in reduction of 80 and 90% stenosis to 0% residual  . ABDOMINAL ANGIOGRAM  10/08/2010   Mid-right SFA, 6x120 EV3 Protege nitinol self-expanding stent,  resulting in reduction of CTO to 0% residual  . ABDOMINAL ANGIOGRAM  06/22/2010   Rt Common Femoral Artery-8x3 Absolute Pro Nitinol self-expanding stent-90% stenosis to 0% residual; Rt External Iliac Artery-9x3 Absolute Pro Nitinol self-expanding stent-70% stenosis to 0% residual; Left External Iliac Artery-8x4 Absolute Pro Nitinol self-expanding stent-90% to 0% residual  . ATHERECTOMY N/A 08/19/2011   Procedure: ATHERECTOMY;  Surgeon: Lorretta Harp, MD;  Location: [facility] CATH LAB;  Service: Cardiovascular;  Laterality: N/A;  . BACK SURGERY    . CARDIAC CATHETERIZATION  10/26/2002  dr Tami Ribas   normal coronary arteries/ normal  LVF  . CARDIOVASCULAR STRESS TEST  06-12-2010   dr berry   normal perfusion study/ no ischemia/  ef 69%  . COLONOSCOPY WITH PROPOFOL N/A 03/19/2016   Procedure: COLONOSCOPY WITH PROPOFOL;  Surgeon: Mauri Pole, MD;  Location: [facility] ENDOSCOPY;  Service: Endoscopy;  Laterality: N/A;  was inpatient, but got d/c'd home and is coming back as an OP  . CYSTOSCOPY W/ URETERAL STENT PLACEMENT Left 06/07/2016   Procedure: CYSTOSCOPY WITH RETROGRADE PYELOGRAM/URETERAL STENT REPLACEMENT;  Surgeon: Cleon Gustin, MD;  Location: [facility] ORS;  Service: Urology;  Laterality: Left;  . CYSTOSCOPY WITH RETROGRADE PYELOGRAM, URETEROSCOPY AND STENT PLACEMENT Right 10/10/2013   Procedure: uretheral dilation, CYSTOSCOPY, URETEROSCOPY, stone extraction, STENT PLACEMENT;  Surgeon: [name], MD;  Location: [place];  Service: Urology;  Laterality: Right;  . CYSTOSCOPY WITH URETEROSCOPY Left 05/09/2016   Procedure: CYSTOSCOPY, RETROGRADE, LEFT URETEROSCOPY, LEFT URETERAL BIOPSIES, HOLMIUM LASER FULGERATION, LEFT URETERAL STENT PLACEMENT;  Surgeon: Irine Seal, MD;  Location: [facility] OR[facility];  Service: Urology;  Laterality: Left;  . HOLMIUM LASER APPLICATION Right 05/11/4494   Procedure: HOLMIUM LASER APPLICATION;  Surgeon: [name], MD;  Location: [facility];  Service:  Urology;  Laterality: Right;  . HOLMIUM LASER APPLICATION Left 08/05/9161   Procedure: TUMOR ABLATION WITH HOLMIUM LASER;  Surgeon: Cleon Gustin, MD;  Location: [facility] OR[facility];  Service: Urology;  Laterality: Left;  . LUMBAR DISC SURGERY  1970's  . RENAL ANGIOGRAM N/A 11/02/2012   Procedure: RENAL ANGIOGRAM;  Surgeon: Lorretta Harp, MD;  Location: [facility] CATH LAB;  Service: Cardiovascular;  Laterality: N/A;  . RENAL ARTERY STENT Left 11/02/2012  . SKIN CANCER EXCISION Right 2017   temple  . SOFT TISSUE CYST EXCISION  1960's   "outside part of lung LLL"    Home Medications:  Prescriptions Prior to Admission  Medication Sig Dispense Refill Last Dose  . albuterol (PROVENTIL HFA;VENTOLIN HFA) 108 (90 BASE) MCG/ACT inhaler Inhale 2 puffs into the lungs every 4 (four) hours as needed for shortness of breath.    09/20/2016 at 1030  . amLODipine (NORVASC) 5 MG tablet Take 5 mg by mouth daily.    09/20/2016 at 0900  . atorvastatin (LIPITOR) 20 MG tablet TAKE 1 TABLET BY MOUTH  DAILY AT 6 PM. (Patient taking differently: Take 20mg  by mouth daily) 90 tablet 3 09/20/2016 at 0900  . BYSTOLIC 10 MG tablet Take 10 mg by mouth daily.   09/20/2016 at 0900  . clopidogrel (PLAVIX) 75 MG tablet Take 75 mg by mouth daily.   09/13/2016  . [employer] D3 2000 units CAPS Take 2,000 Units by mouth daily.   5 09/20/2016 at 0900  . dextromethorphan (DELSYM) 30 MG/5ML liquid Take 30 mg by mouth daily as needed for cough.    Past Month at Unknown time  . finasteride (PROSCAR) 5 MG tablet Take 5 mg by mouth daily.    09/20/2016 at 0900  . pantoprazole (PROTONIX) 40 MG tablet Take 40 mg by mouth daily.  5 09/20/2016 at 0900  . tamsulosin (FLOMAX) 0.4 MG CAPS capsule Take 0.4 mg by mouth daily.    09/20/2016 at 0900  . tiotropium (SPIRIVA) 18 MCG inhalation capsule Place 18 mcg into inhaler and inhale daily.   09/20/2016 at 0900  . oxyCODONE-acetaminophen (PERCOCET/ROXICET) 5-325 MG tablet Take 1 tablet by mouth every 4 (four) hours as needed  for moderate pain. (Patient not taking: Reported on 08/19/2016) 30 tablet 0 Completed Course at Unknown time   Allergies:  Allergies  Allergen Reactions  . Pneumococcal Vaccines Swelling    Lip swelling    Family History  Problem Relation Age of Onset  . Hypertension Mother   . Cancer Father   . Diabetes Brother   . Hypertension Son    Social History:  reports that he quit smoking about 6 years ago. His smoking use included Cigarettes. He has a 60.00 pack-year smoking history. He has never used smokeless tobacco. He reports that he does not drink alcohol or use drugs.  Review of Systems  Genitourinary: Positive for hematuria.  All other systems reviewed and are negative.   Physical Exam:  Vital signs in last 24 hours: Temp:  [98.4 F (36.9 C)] 98.4 F (36.9  C) (08/20 1111) Pulse Rate:  [67] 67 (08/20 1111) Resp:  [16] 16 (08/20 1111) BP: (142)/(60) 142/60 (08/20 1111) SpO2:  [97 %] 97 % (08/20 1111) Weight:  [67 kg (147 lb 9.6 oz)] 67 kg (147 lb 9.6 oz) (08/20 1140) Physical Exam  Constitutional: He is oriented to person, place, and time. He appears well-developed and well-nourished.  HENT:  Head: Normocephalic and atraumatic.  Eyes: Pupils are equal, round, and reactive to light. EOM are normal.  Neck: Normal range of motion. No thyromegaly present.  Cardiovascular: Normal rate and regular rhythm.   Respiratory: Effort normal. No respiratory distress.  GI: Soft. He exhibits no distension.  Musculoskeletal: Normal range of motion. He exhibits no edema.  Neurological: He is alert and oriented to person, place, and time.  Skin: Skin is warm and dry.  Psychiatric: He has a normal mood and affect. His behavior is normal. Judgment and thought content normal.    Laboratory Data:  No results found for this or any previous visit (from the past 24 hour(s)). No results found for this or any previous visit (from the past 240 hour(s)). Creatinine:  Recent Labs  09/15/16 1500   CREATININE 1.12   Baseline Creatinine: 1.1  Impression/Assessment:  83yo with a left ureteral tumor  Plan:  The risks/benefits/alternatives to left ureteroscopy with tumor ablation was explained to the patient and he understands and wishes to proceed with surgery  Nicolette Bang 09/20/2016, 12:49 PM

## 2016-09-20 NOTE — Transfer of Care (Signed)
Immediate Anesthesia Transfer of Care Note  Patient: MANFORD SPRONG  Procedure(s) Performed: Procedure(s): CYSTOSCOPY WITH RETROGRADE PYELOGRAM/URETERAL STENT PLACEMENT (Left) CYSTOSCOPY/URETEROSCOPY/HOLMIUM LASER (Left)  Patient Location: PACU  Anesthesia Type:General  Level of Consciousness: sedated  Airway & Oxygen Therapy: Patient Spontanous Breathing and Patient connected to face mask oxygen  Post-op Assessment: Report given to RN and Post -op Vital signs reviewed and stable  Post vital signs: Reviewed and stable  Last Vitals:  Vitals:   09/20/16 1111 09/20/16 1409  BP: (!) 142/60 127/60  Pulse: 67 (!) 53  Resp: 16 18  Temp: 36.9 C (!) (P) 36.4 C  SpO2: 97% 100%    Last Pain:  Vitals:   09/20/16 1111  TempSrc: Oral      Patients Stated Pain Goal: 3 (78/47/84 1282)  Complications: No apparent anesthesia complications

## 2016-09-20 NOTE — Discharge Instructions (Signed)
Ureteral Stent Implantation, Care After °Refer to this sheet in the next few weeks. These instructions provide you with information about caring for yourself after your procedure. Your health care provider may also give you more specific instructions. Your treatment has been planned according to current medical practices, but problems sometimes occur. Call your health care provider if you have any problems or questions after your procedure. °What can I expect after the procedure? °After the procedure, it is common to have: °· Nausea. °· Mild pain when you urinate. You may feel this pain in your lower back or lower abdomen. Pain should stop within a few minutes after you urinate. This may last for up to 1 week. °· A small amount of blood in your urine for several days. ° °Follow these instructions at home: ° °Medicines °· Take over-the-counter and prescription medicines only as told by your health care provider. °· If you were prescribed an antibiotic medicine, take it as told by your health care provider. Do not stop taking the antibiotic even if you start to feel better. °· Do not drive for 24 hours if you received a sedative. °· Do not drive or operate heavy machinery while taking prescription pain medicines. °Activity °· Return to your normal activities as told by your health care provider. Ask your health care provider what activities are safe for you. °· Do not lift anything that is heavier than 10 lb (4.5 kg). Follow this limit for 1 week after your procedure, or for as long as told by your health care provider. °General instructions °· Watch for any blood in your urine. Call your health care provider if the amount of blood in your urine increases. °· If you have a catheter: °? Follow instructions from your health care provider about taking care of your catheter and collection bag. °? Do not take baths, swim, or use a hot tub until your health care provider approves. °· Drink enough fluid to keep your urine  clear or pale yellow. °· Keep all follow-up visits as told by your health care provider. This is important. °Contact a health care provider if: °· You have pain that gets worse or does not get better with medicine, especially pain when you urinate. °· You have difficulty urinating. °· You feel nauseous or you vomit repeatedly during a period of more than 2 days after the procedure. °Get help right away if: °· Your urine is dark red or has blood clots in it. °· You are leaking urine (have incontinence). °· The end of the stent comes out of your urethra. °· You cannot urinate. °· You have sudden, sharp, or severe pain in your abdomen or lower back. °· You have a fever. °This information is not intended to replace advice given to you by your health care provider. Make sure you discuss any questions you have with your health care provider. °Document Released: 09/20/2012 Document Revised: 06/26/2015 Document Reviewed: 08/02/2014 °Elsevier Interactive Patient Education © 2018 [employer] ° ° ° °General Anesthesia, Adult, Care After °These instructions provide you with information about caring for yourself after your procedure. Your health care provider may also give you more specific instructions. Your treatment has been planned according to current medical practices, but problems sometimes occur. Call your health care provider if you have any problems or questions after your procedure. °What can I expect after the procedure? °After the procedure, it is common to have: °· Vomiting. °· A sore throat. °· Mental slowness. ° °It is   common to feel: °· Nauseous. °· Cold or shivery. °· Sleepy. °· Tired. °· Sore or achy, even in parts of your body where you did not have surgery. ° °Follow these instructions at home: °For at least 24 hours after the procedure: °· Do not: °? Participate in activities where you could fall or become injured. °? Drive. °? Use heavy machinery. °? Drink alcohol. °? Take sleeping pills or medicines  that cause drowsiness. °? Make important decisions or sign legal documents. °? Take care of children on your own. °· Rest. °Eating and drinking °· If you vomit, drink water, juice, or soup when you can drink without vomiting. °· Drink enough fluid to keep your urine clear or pale yellow. °· Make sure you have little or no nausea before eating solid foods. °· Follow the diet recommended by your health care provider. °General instructions °· Have a responsible adult stay with you until you are awake and alert. °· Return to your normal activities as told by your health care provider. Ask your health care provider what activities are safe for you. °· Take over-the-counter and prescription medicines only as told by your health care provider. °· If you smoke, do not smoke without supervision. °· Keep all follow-up visits as told by your health care provider. This is important. °Contact a health care provider if: °· You continue to have nausea or vomiting at home, and medicines are not helpful. °· You cannot drink fluids or start eating again. °· You cannot urinate after 8-12 hours. °· You develop a skin rash. °· You have fever. °· You have increasing redness at the site of your procedure. °Get help right away if: °· You have difficulty breathing. °· You have chest pain. °· You have unexpected bleeding. °· You feel that you are having a life-threatening or urgent problem. °This information is not intended to replace advice given to you by your health care provider. Make sure you discuss any questions you have with your health care provider. °Document Released: 04/26/2000 Document Revised: 06/23/2015 Document Reviewed: 01/02/2015 °Elsevier Interactive Patient Education © 2018 [employer] ° °

## 2016-09-20 NOTE — Brief Op Note (Signed)
09/20/2016  2:01 PM  PATIENT:  Douglas Monroe  83 y.o. male  PRE-OPERATIVE DIAGNOSIS:  LEFT URETERAL TUMOR  POST-OPERATIVE DIAGNOSIS:  LEFT URETERAL TUMOR  PROCEDURE:  Procedure(s): CYSTOSCOPY WITH RETROGRADE PYELOGRAM/URETERAL STENT PLACEMENT (Left) CYSTOSCOPY/URETEROSCOPY/HOLMIUM LASER (Left)  SURGEON:  Surgeon(s) and Role:    * Eliyana Pagliaro, Candee Furbish, MD - Primary  PHYSICIAN ASSISTANT:   ASSISTANTS: none   ANESTHESIA:   general  EBL:  No intake/output data recorded.  BLOOD ADMINISTERED:none  DRAINS: left 6x26 JJ ureteral stent   LOCAL MEDICATIONS USED:  NONE  SPECIMEN:  Source of Specimen:  left ureteral tumor  DISPOSITION OF SPECIMEN:  PATHOLOGY  COUNTS:  YES  TOURNIQUET:  * No tourniquets in log *  DICTATION: .Note written in EPIC  PLAN OF CARE: Discharge to home after PACU  PATIENT DISPOSITION:  PACU - hemodynamically stable.   Delay start of Pharmacological VTE agent (>24hrs) due to surgical blood loss or risk of bleeding: not applicable

## 2016-09-22 NOTE — Addendum Note (Signed)
Addendum  created 09/22/16 1154 by Albertha Ghee, MD   Sign clinical note

## 2016-09-27 DIAGNOSIS — C669 Malignant neoplasm of unspecified ureter: Secondary | ICD-10-CM | POA: Diagnosis not present

## 2016-09-27 DIAGNOSIS — R8290 Unspecified abnormal findings in urine: Secondary | ICD-10-CM | POA: Diagnosis not present

## 2016-09-27 DIAGNOSIS — C661 Malignant neoplasm of right ureter: Secondary | ICD-10-CM | POA: Diagnosis not present

## 2016-09-27 NOTE — Op Note (Signed)
Preoperative diagnosis: Right ureteral cancer  Postoperative diagnosis: Same  Procedure: 1 cystoscopy 2 right retrograde pyelography 3.  Intraoperative fluoroscopy, under one hour, with interpretation 4.  Right ureteroscopic tumor ablation and biopsy 5.  Right 6 x 26 JJ stent exchange  Attending: Rosie Fate  Anesthesia: General  Estimated blood loss: None  Drains: Right 6 x 26 JJ ureteral stent without tether  Specimens: right distal ureteral tumor  Antibiotics: ancef  Findings: Right distal ureteral areas of erythema concerning for malignancy. No hydronephrosis. Ureteral orifices in normal anatomic location.  Indications: Patient is a 83 year old male with a history of right ureteral tumor s/p BCG.  After discussing treatment options, she decided proceed with right ureteroscopic stone manipulation.  Procedure her in detail: The patient was brought to the operating room and a brief timeout was done to ensure correct patient, correct procedure, correct site.  General anesthesia was administered patient was placed in dorsal lithotomy position.  Her genitalia was then prepped and draped in usual sterile fashion.  A rigid 43 French cystoscope was passed in the urethra and the bladder.  Bladder was inspected free masses or lesions.  the right ureteral orifices were in the normal orthotopic locations.  Using a grasper the ureteral stent was brought to the urethral meatus. A zipwire was advanced up to the renal pelvis and the stent was then removed. a 6 french ureteral catheter was then instilled into the right ureter orifice.  a gentle retrograde was obtained and findings noted above.    we then removed the cystoscope and cannulated the right ureteral orifice with a semirigid ureteroscope.  We encountered an area in the distal ureter concerning for recurrent tumor. We obtained 2 biopsies which were sent for pathology. We then used the ureteral bugbee to fulgerate the erythematous lesion.  we then placed a 6 x 26 double-j ureteral stent over the original zip wire. We then removed the wire and good coil was noted in the the renal pelvis under fluoroscopy and the bladder under direct vision.  the bladder was then drained and this concluded the procedure which was well tolerated by patient.  Complications: None  Condition: Stable, extubated, transferred to PACU  Plan: Patient is to be discharged home as to follow-up in 2 weeks for stent removal.

## 2016-10-11 DIAGNOSIS — C661 Malignant neoplasm of right ureter: Secondary | ICD-10-CM | POA: Diagnosis not present

## 2016-10-25 ENCOUNTER — Telehealth: Payer: Self-pay | Admitting: Cardiovascular Disease

## 2016-10-25 NOTE — Telephone Encounter (Signed)
Requesting surgical clearance:  1. Type of surgery: Cystoscopy, Left Retrograde Pyelogram, Left Ureteroscopy with Tumor Ablation and Stent Placement  2. Surgeon: Dr. Nicolette Bang  3.Surgical Date: tbd  4. Medications that need to be held: none--stated that patient may remain on Plavix and ASA for procedure   5. CAD: No  6. I will defer to:  Dr. Pearla Dubonnet Information:  [facility] Fax: [phone]

## 2016-10-27 NOTE — Telephone Encounter (Signed)
Cleared

## 2016-10-28 DIAGNOSIS — J441 Chronic obstructive pulmonary disease with (acute) exacerbation: Secondary | ICD-10-CM | POA: Diagnosis not present

## 2016-10-29 DIAGNOSIS — J441 Chronic obstructive pulmonary disease with (acute) exacerbation: Secondary | ICD-10-CM | POA: Diagnosis not present

## 2016-10-29 NOTE — Telephone Encounter (Signed)
Clearance routed to number provided via EPIC. 

## 2016-11-09 DIAGNOSIS — Z23 Encounter for immunization: Secondary | ICD-10-CM | POA: Diagnosis not present

## 2016-11-19 DIAGNOSIS — J441 Chronic obstructive pulmonary disease with (acute) exacerbation: Secondary | ICD-10-CM | POA: Diagnosis not present

## 2016-11-21 ENCOUNTER — Emergency Department (HOSPITAL_COMMUNITY)
Admission: EM | Admit: 2016-11-21 | Discharge: 2016-11-21 | Disposition: A | Discharge status: Home / Self Care | Payer: Medicare Other | Attending: Emergency Medicine | Admitting: Emergency Medicine

## 2016-11-21 ENCOUNTER — Emergency Department (HOSPITAL_COMMUNITY): Payer: Medicare Other

## 2016-11-21 ENCOUNTER — Encounter (HOSPITAL_COMMUNITY): Payer: Self-pay | Admitting: Emergency Medicine

## 2016-11-21 DIAGNOSIS — Z87891 Personal history of nicotine dependence: Secondary | ICD-10-CM | POA: Insufficient documentation

## 2016-11-21 DIAGNOSIS — Z79899 Other long term (current) drug therapy: Secondary | ICD-10-CM | POA: Diagnosis not present

## 2016-11-21 DIAGNOSIS — N182 Chronic kidney disease, stage 2 (mild): Secondary | ICD-10-CM | POA: Diagnosis not present

## 2016-11-21 DIAGNOSIS — Z85828 Personal history of other malignant neoplasm of skin: Secondary | ICD-10-CM | POA: Insufficient documentation

## 2016-11-21 DIAGNOSIS — J441 Chronic obstructive pulmonary disease with (acute) exacerbation: Secondary | ICD-10-CM | POA: Diagnosis not present

## 2016-11-21 DIAGNOSIS — I129 Hypertensive chronic kidney disease with stage 1 through stage 4 chronic kidney disease, or unspecified chronic kidney disease: Secondary | ICD-10-CM | POA: Diagnosis not present

## 2016-11-21 DIAGNOSIS — R0602 Shortness of breath: Secondary | ICD-10-CM | POA: Diagnosis not present

## 2016-11-21 DIAGNOSIS — N289 Disorder of kidney and ureter, unspecified: Secondary | ICD-10-CM | POA: Insufficient documentation

## 2016-11-21 DIAGNOSIS — I2510 Atherosclerotic heart disease of native coronary artery without angina pectoris: Secondary | ICD-10-CM | POA: Diagnosis not present

## 2016-11-21 DIAGNOSIS — N189 Chronic kidney disease, unspecified: Secondary | ICD-10-CM | POA: Diagnosis not present

## 2016-11-21 DIAGNOSIS — Z7902 Long term (current) use of antithrombotics/antiplatelets: Secondary | ICD-10-CM | POA: Insufficient documentation

## 2016-11-21 LAB — CBC WITH DIFFERENTIAL/PLATELET
BASOS ABS: 0 10*3/uL (ref 0.0–0.1)
Basophils Relative: 0 %
EOS PCT: 2 %
Eosinophils Absolute: 0.2 10*3/uL (ref 0.0–0.7)
HEMATOCRIT: 43.7 % (ref 39.0–52.0)
Hemoglobin: 14.9 g/dL (ref 13.0–17.0)
LYMPHS ABS: 1.6 10*3/uL (ref 0.7–4.0)
LYMPHS PCT: 17 %
MCH: 31.2 pg (ref 26.0–34.0)
MCHC: 34.1 g/dL (ref 30.0–36.0)
MCV: 91.4 fL (ref 78.0–100.0)
Monocytes Absolute: 0.9 10*3/uL (ref 0.1–1.0)
Monocytes Relative: 9 %
NEUTROS ABS: 6.5 10*3/uL (ref 1.7–7.7)
Neutrophils Relative %: 72 %
PLATELETS: 182 10*3/uL (ref 150–400)
RBC: 4.78 MIL/uL (ref 4.22–5.81)
RDW: 14.3 % (ref 11.5–15.5)
WBC: 9.1 10*3/uL (ref 4.0–10.5)

## 2016-11-21 LAB — I-STAT TROPONIN, ED: Troponin i, poc: 0 ng/mL (ref 0.00–0.08)

## 2016-11-21 LAB — BASIC METABOLIC PANEL
ANION GAP: 11 (ref 5–15)
BUN: 18 mg/dL (ref 6–20)
CHLORIDE: 105 mmol/L (ref 101–111)
CO2: 24 mmol/L (ref 22–32)
Calcium: 9.5 mg/dL (ref 8.9–10.3)
Creatinine, Ser: 1.29 mg/dL — ABNORMAL HIGH (ref 0.61–1.24)
GFR calc Af Amer: 58 mL/min — ABNORMAL LOW (ref >60)
GFR, EST NON AFRICAN AMERICAN: 50 mL/min — AB (ref >60)
GLUCOSE: 113 mg/dL — AB (ref 65–99)
POTASSIUM: 3.7 mmol/L (ref 3.5–5.1)
SODIUM: 140 mmol/L (ref 135–145)

## 2016-11-21 LAB — BRAIN NATRIURETIC PEPTIDE: B Natriuretic Peptide: 90.1 pg/mL (ref 0.0–100.0)

## 2016-11-21 MED ORDER — PREDNISONE 50 MG PO TABS: 50.0000 mg | ORAL_TABLET | Freq: Every day | ORAL | 0 refills | Status: DC

## 2016-11-21 MED ORDER — IPRATROPIUM BROMIDE 0.02 % IN SOLN: 0.5000 mg | RESPIRATORY_TRACT | 12 refills | Status: DC | PRN

## 2016-11-21 MED ORDER — METHYLPREDNISOLONE SODIUM SUCC 125 MG IJ SOLR
125.0000 mg | Freq: Once | INTRAMUSCULAR | Status: AC
  Administered 2016-11-21: 125 mg via INTRAVENOUS
  Filled 2016-11-21: qty 2

## 2016-11-21 MED ORDER — IPRATROPIUM BROMIDE HFA 17 MCG/ACT IN AERS: 2.0000 | INHALATION_SPRAY | RESPIRATORY_TRACT | 12 refills | Status: DC | PRN

## 2016-11-21 MED ORDER — IPRATROPIUM BROMIDE 0.02 % IN SOLN
0.5000 mg | Freq: Once | RESPIRATORY_TRACT | Status: AC
  Administered 2016-11-21: 0.5 mg via RESPIRATORY_TRACT
  Filled 2016-11-21: qty 2.5

## 2016-11-21 NOTE — ED Notes (Signed)
Pt ambulated in hall on rm air, pt's o2 started out at 100% in the room but in the process of getting up he dropped to 92%. Pt remained a consist ant 92% on rm air til toward the end of the walk then he dropped to 90% on rm air. Pt sts he felt a little winded and light headed/dizzy at the end of the walk.

## 2016-11-21 NOTE — Discharge Instructions (Signed)
Use ipratropium in place of albuterol - both inhaler and solution for your nebulizer. Talk with your doctor about considering a therapeutic challenge with albuterol at some point to see if you are truly allergic to it.

## 2016-11-21 NOTE — ED Provider Notes (Signed)
[facility] DEPT Provider Note   CSN: [id number] Arrival date & time: 11/21/16  0055     History   Chief Complaint Chief Complaint  Patient presents with  . Shortness of Breath    HPI Douglas Monroe is a 83 y.o. male.  The history is provided by the patient.  He has history of COPD, hypertension, abdominal aortic aneurysm.  He has been having dyspnea for the last 2 weeks.  Dyspnea is worse with exertion.  He has chronic difficulty lying flat and has not tried to lie down with this.  There is an associated nonproductive cough.  Denies fever, chills, sweats.  Denies chest pain, heaviness, tightness, pressure.  He did see his primary care physician yesterday who started him on azithromycin and also ordered prescription for prednisone.  Unfortunately, there was some miscommunication and the prednisone prescription was never filled.  Earlier today, he had an episode where he broke out in hives on his hands and genital area and felt like his throat was closing off.  He applied topical diphenhydramine and took some oral famotidine.  Hives and feeling like his throat was closing gradually improved over 4 hours.  This occurred after he had used his albuterol inhaler, and he is worried that he might be allergic to it.  Past Medical History:  Diagnosis Date  . Abdominal aortic aneurysm ([place])    infrarenal -- monitored by dr berry (note states stable 4.0 to 4.1cm)  . Anxiety    situational  . Arthritis    "hands" (03/15/2016)  . Cancer of skin of temple 2017   right  . COPD with emphysema ([place])   . Dyspnea    with exertion  . Exertional dyspnea   . First degree heart block   . GERD (gastroesophageal reflux disease)   . Headache(784.0)    uses goody powder; "maybe weekly, maybe not that much" (03/15/2016)  . History of kidney stones   . HOH (hard of hearing)    left ear; no hearing aid   . HTN (hypertension) 08/20/2011  . Hyperlipidemia   . Hypertension     . Ischemic colitis ([place])   . Left carotid artery stenosis    moderate  . Migraine    "long time ago" (03/15/2016)  . 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 PVD (peripheral vascular disease) with claudication [facility]) cardiologist-  dr berry   s/p bilateral iliac stents and right sfa stenting/  doppper study 09-05-2012  revealed ABIs close to one bilaterally with patent stents/  stenting left renal artery stenosis   . Right ureteral stone   . S/P arterial stent    left renal angioplasty and stent 11-02-2012  . Sleep apnea    at risk for OSA. stop/bang score= 6; sent to PCP 10/04/13; denies use of mask on 03/15/2016  . Tubular adenoma of colon     Patient Active Problem List   Diagnosis Date Noted  . Hydronephrosis 05/08/2016  . Acute renal failure superimposed on stage 2 chronic kidney disease ([place]) 05/08/2016  . Polyp of cecum   . Polyp of transverse colon   . BRBPR (bright red blood per rectum)   . Hematochezia   . Ischemic colitis ([place])   . Ulceration, colon   . Left sided abdominal pain   . Rectal bleeding 03/15/2016  . Acute hyperglycemia 03/15/2016  . COPD exacerbation ([place])   . Renal insufficiency   . PVD (peripheral vascular disease) ([place])   .  Ureteral calculus 10/10/2013  . Hyperlipidemia 07/24/2013  . S/P arterial stent, to Lt renal art. 11/02/12 11/03/2012  . Carotid artery disease ([place]) 10/06/2012  . Renal artery stenosis, progression of disease 10/06/2012  . Abdominal aortic aneurysm ([place]) 10/06/2012  . Normal coronary arteries-  07/21/2012  . PVD (peripheral vascular disease) with claudication, previous stents to bilateral iliacs. Rt. SFA also.  08/20/2011  . Claudication of both lower extremities ([place]) 08/20/2011  . PVD S/P multiple PCIs 08/20/2011  . COPD (chronic obstructive pulmonary disease) ([place]) 08/20/2011  . HTN (hypertension) 08/20/2011    Past Surgical History:  Procedure Laterality Date  . ABDOMINAL ANGIOGRAM  08/19/2011    Left common iliac artery at the ostium, 6x73mm ICAST covered stent, common femoral artery, 8x150mm Abbott absolute pro nitinol self-expanding stent and resulting in reduction of 80 and 90% stenosis to 0% residual  . ABDOMINAL ANGIOGRAM  10/08/2010   Mid-right SFA, 6x120 EV3 Protege nitinol self-expanding stent, resulting in reduction of CTO to 0% residual  . ABDOMINAL ANGIOGRAM  06/22/2010   Rt Common Femoral Artery-8x3 Absolute Pro Nitinol self-expanding stent-90% stenosis to 0% residual; Rt External Iliac Artery-9x3 Absolute Pro Nitinol self-expanding stent-70% stenosis to 0% residual; Left External Iliac Artery-8x4 Absolute Pro Nitinol self-expanding stent-90% to 0% residual  . ATHERECTOMY N/A 08/19/2011   Procedure: ATHERECTOMY;  Surgeon: Lorretta Harp, MD;  Location: [facility] CATH LAB;  Service: Cardiovascular;  Laterality: N/A;  . BACK SURGERY    . CARDIAC CATHETERIZATION  10/26/2002  dr Tami Ribas   normal coronary arteries/ normal  LVF  . CARDIOVASCULAR STRESS TEST  06-12-2010   dr berry   normal perfusion study/ no ischemia/  ef 69%  . COLONOSCOPY WITH PROPOFOL N/A 03/19/2016   Procedure: COLONOSCOPY WITH PROPOFOL;  Surgeon: Mauri Pole, MD;  Location: [facility]COPY;  Service: Endoscopy;  Laterality: N/A;  was inpatient, but got d/c'd home and is coming back as an OP  . CYSTOSCOPY W/ URETERAL STENT PLACEMENT Left 06/07/2016   Procedure: CYSTOSCOPY WITH RETROGRADE PYELOGRAM/URETERAL STENT REPLACEMENT;  Surgeon: Cleon Gustin, MD;  Location: [facility] OR[facility];  Service: Urology;  Laterality: Left;  . CYSTOSCOPY W/ URETERAL STENT PLACEMENT Left 09/20/2016   Procedure: CYSTOSCOPY WITH RETROGRADE PYELOGRAM/URETERAL STENT PLACEMENT;  Surgeon: Cleon Gustin, MD;  Location: [facility] OR[facility];  Service: Urology;  Laterality: Left;  . CYSTOSCOPY WITH RETROGRADE PYELOGRAM, URETEROSCOPY AND STENT PLACEMENT Right 10/10/2013   Procedure: uretheral dilation, CYSTOSCOPY, URETEROSCOPY, stone extraction, STENT PLACEMENT;   Surgeon: [name], MD;  Location: [facility];  Service: Urology;  Laterality: Right;  . CYSTOSCOPY WITH URETEROSCOPY Left 05/09/2016   Procedure: CYSTOSCOPY, RETROGRADE, LEFT URETEROSCOPY, LEFT URETERAL BIOPSIES, HOLMIUM LASER FULGERATION, LEFT URETERAL STENT PLACEMENT;  Surgeon: Irine Seal, MD;  Location: [facility];  Service: Urology;  Laterality: Left;  . CYSTOSCOPY/URETEROSCOPY/HOLMIUM LASER Left 09/20/2016   Procedure: CYSTOSCOPY/URETEROSCOPY/HOLMIUM LASER;  Surgeon: Cleon Gustin, MD;  Location: [facility];  Service: Urology;  Laterality: Left;  . HOLMIUM LASER APPLICATION Right 05/08/4257   Procedure: HOLMIUM LASER APPLICATION;  Surgeon: [name], MD;  Location[facility];  Service: Urology;  Laterality: Right;  . HOLMIUM LASER APPLICATION Left 06/06/3873   Procedure: TUMOR ABLATION WITH HOLMIUM LASER;  Surgeon: Cleon Gustin, MD;  Location: [facility];  Service: Urology;  Laterality: Left;  . LUMBAR DISC SURGERY  1970's  . RENAL ANGIOGRAM N/A 11/02/2012   Procedure: RENAL ANGIOGRAM;  Surgeon: Lorretta Harp, MD;  Location: [facility] CATH LAB;  Service:  Cardiovascular;  Laterality: N/A;  . RENAL ARTERY STENT Left 11/02/2012  . SKIN CANCER EXCISION Right 2017   temple  . SOFT TISSUE CYST EXCISION  1960's   "outside part of lung LLL"       Home Medications    Prior to Admission medications   Medication Sig Start Date End Date Taking? Authorizing Provider  albuterol (PROVENTIL HFA;VENTOLIN HFA) 108 (90 BASE) MCG/ACT inhaler Inhale 2 puffs into the lungs every 4 (four) hours as needed for shortness of breath.     [provider]  amLODipine (NORVASC) 5 MG tablet Take 5 mg by mouth daily.     [provider]  atorvastatin (LIPITOR) 20 MG tablet TAKE 1 TABLET BY MOUTH  DAILY AT 6 PM. Patient taking differently: Take 20mg  by mouth daily 01/14/16   Lorretta Harp, MD  BYSTOLIC 10 MG tablet Take 10 mg by mouth daily. 02/23/16   [provider]  clopidogrel (PLAVIX) 75 MG tablet Take 75 mg by mouth daily.    [provider]  [employer] D3 2000 units CAPS Take 2,000 Units by mouth daily.  01/31/16   [provider]  dextromethorphan (DELSYM) 30 MG/5ML liquid Take 30 mg by mouth daily as needed for cough.     [provider]  finasteride (PROSCAR) 5 MG tablet Take 5 mg by mouth daily.     [provider]  oxyCODONE-acetaminophen (PERCOCET/ROXICET) 5-325 MG tablet Take 1 tablet by mouth every 4 (four) hours as needed for moderate pain. 09/20/16   McKenzie, Candee Furbish, MD  pantoprazole (PROTONIX) 40 MG tablet Take 40 mg by mouth daily. 01/21/16   [provider]  tamsulosin (FLOMAX) 0.4 MG CAPS capsule Take 0.4 mg by mouth daily.  07/13/13   [provider]  tiotropium (SPIRIVA) 18 MCG inhalation capsule Place 18 mcg into inhaler and inhale daily.    [provider]    Family History Family History  Problem Relation Age of Onset  . Hypertension Mother   . Cancer Father   . Diabetes Brother   . Hypertension Son     Social History Social History  Substance Use Topics  . Smoking status: Former Smoker    Packs/day: 1.00    Years: 60.00    Types: Cigarettes    Quit date: 06/02/2010  . Smokeless tobacco: Never Used  . Alcohol use No     Allergies   Pneumococcal vaccines   Review of Systems Review of Systems  All other systems reviewed and are negative.    Physical Exam Updated Vital Signs BP 126/77 (BP Location: Right Arm)   Pulse 73   Temp 97.8 F (36.6 C) (Oral)   Resp (!) 22   Ht 5\' 6"  (1.676 m)   Wt 64 kg (141 lb)   SpO2 95%   BMI 22.76 kg/m   Physical Exam  Nursing note and vitals reviewed.  83 year old male, appears dyspneic, but is in no acute distress.  He is able to speak in complete sentences without stopping to take a breath.  Vital signs are significant for tachypnea. Oxygen saturation is 95%, which is normal. Head is  normocephalic and atraumatic. PERRLA, EOMI. Oropharynx is clear. Neck is nontender and supple without adenopathy or JVD. Back is nontender and there is no CVA tenderness. Lungs have diminished airflow throughout.  There is slightly prolonged exhalation phase.  There are no overt rales, wheezes, or rhonchi. Chest is nontender. Heart has regular rate and rhythm  without murmur. Abdomen is soft, flat, nontender without masses or hepatosplenomegaly and peristalsis is normoactive. Extremities have no cyanosis or edema, full range of motion is present. Skin is warm and dry without rash. Neurologic: Mental status is normal, cranial nerves are intact, there are no motor or sensory deficits.  ED Treatments / Results  Labs (all labs ordered are listed, but only abnormal results are displayed) Labs Reviewed  BASIC METABOLIC PANEL - Abnormal; Notable for the following:       Result Value   Glucose, Bld 113 (*)    Creatinine, Ser 1.29 (*)    GFR calc non Af Amer 50 (*)    GFR calc Af Amer 58 (*)    All other components within normal limits  CBC WITH DIFFERENTIAL/PLATELET  BRAIN NATRIURETIC PEPTIDE  I-STAT TROPONIN, ED    EKG  EKG Interpretation  Date/Time:  Sunday November 21 2016 01:20:01 EDT Ventricular Rate:  61 PR Interval:    QRS Duration: 89 QT Interval:  397 QTC Calculation: 400 R Axis:   -75 Text Interpretation:  Sinus rhythm Left anterior fascicular block Anterior infarct, old When compared with ECG of 08/19/2016, No significant change was found Confirmed by Delora Fuel ([id number]) on 11/21/2016 1:23:09 AM       Radiology Dg Chest 2 View  Result Date: 11/21/2016 CLINICAL DATA:  Increased shortness of breath since yesterday. History of COPD, hypertension, former smoker. EXAM: CHEST  2 VIEW COMPARISON:  03/15/2016 FINDINGS: Hyperinflation consistent with emphysema. Scattered fibrosis and bronchitic changes in the lungs. Parenchymal pattern is similar to prior study. No focal  consolidation or airspace disease. Slight fluid or thickened pleura in the left costophrenic angle. No pneumothorax. Mediastinal contours appear intact. IMPRESSION: Emphysematous and chronic bronchitic changes in the lungs. Fluid or thickened pleura in the left costophrenic angle. No evidence of active infiltration or consolidation. Electronically Signed   By: Lucienne Capers M.D.   On: 11/21/2016 01:32    Procedures Procedures (including critical care time)  Medications Ordered in ED Medications - No data to display   Initial Impression / Assessment and Plan / ED Course  I have reviewed the triage vital signs and the nursing notes.  Pertinent labs & imaging results that were available during my care of the patient were reviewed by me and considered in my medical decision making (see chart for details).  COPD exacerbation.  He is given a dose of methylprednisolone.  He will be given a nebulizer treatment with ipratropium and reassess.  Old records are reviewed, and he does have prior ED visits and hospitalizations for COPD.  I question whether he truly had an allergy to albuterol.  Since there is no good alternative medication to use, I am considering giving him a trial in the ED where acute allergic reaction can be treated aggressively.  He feels considerably better following above-noted treatment.  He is able to ambulate in the emergency department without dangerous oxygen desaturation, although he was somewhat dyspneic.  He will be discharged with prescription for prednisone and ipratropium.  Ipratropium prescription is given both as an inhaler and solution for his nebulizer.  Will defer for possible challenge with albuterol for now, consider challenge when he is not having a COPD flareup.  Follow-up with PCP in 5 days.  Return precautions discussed.  Final Clinical Impressions(s) / ED Diagnoses   Final diagnoses:  COPD exacerbation (HCC)  Renal insufficiency    New Prescriptions New  Prescriptions   IPRATROPIUM (ATROVENT HFA) 17 MCG/ACT  INHALER    Inhale 2 puffs into the lungs every 4 (four) hours as needed for wheezing (or shortness of breath).   IPRATROPIUM (ATROVENT) 0.02 % NEBULIZER SOLUTION    Take 2.5 mLs (0.5 mg total) by nebulization every 4 (four) hours as needed for wheezing or shortness of breath.   PREDNISONE (DELTASONE) 50 MG TABLET    Take 1 tablet (50 mg total) by mouth daily.     Delora Fuel, MD 61/47/09 [phone]

## 2016-11-21 NOTE — ED Triage Notes (Signed)
Pt reports having increasing shortness of breath since yesterday and was seen by PCP on 11/19/16 and was given Z-pack and has taken two doses. Pt was supposed to be prescribed prednisone but pharmacy lost prescription. And is to have CXR on Monday. Pt has labored breathing and diminished lung sounds throughout.

## 2016-11-29 DIAGNOSIS — J449 Chronic obstructive pulmonary disease, unspecified: Secondary | ICD-10-CM | POA: Diagnosis not present

## 2016-11-29 DIAGNOSIS — Z09 Encounter for follow-up examination after completed treatment for conditions other than malignant neoplasm: Secondary | ICD-10-CM | POA: Diagnosis not present

## 2016-12-02 ENCOUNTER — Institutional Professional Consult (permissible substitution): Payer: Medicare Other | Admitting: Pulmonary Disease

## 2016-12-03 ENCOUNTER — Encounter: Payer: Self-pay | Admitting: Pulmonary Disease

## 2016-12-03 ENCOUNTER — Ambulatory Visit (INDEPENDENT_AMBULATORY_CARE_PROVIDER_SITE_OTHER): Payer: Medicare Other | Admitting: Pulmonary Disease

## 2016-12-03 VITALS — BP 126/74 | HR 74 | Ht 66.5 in | Wt 141.4 lb

## 2016-12-03 DIAGNOSIS — R0602 Shortness of breath: Secondary | ICD-10-CM

## 2016-12-03 DIAGNOSIS — R918 Other nonspecific abnormal finding of lung field: Secondary | ICD-10-CM

## 2016-12-03 DIAGNOSIS — J449 Chronic obstructive pulmonary disease, unspecified: Secondary | ICD-10-CM

## 2016-12-03 LAB — NITRIC OXIDE: NITRIC OXIDE: 18

## 2016-12-03 MED ORDER — IPRATROPIUM-ALBUTEROL 0.5-2.5 (3) MG/3ML IN SOLN: 3.0000 mL | Freq: Four times a day (QID) | RESPIRATORY_TRACT | Status: DC

## 2016-12-03 MED ORDER — IPRATROPIUM-ALBUTEROL 0.5-2.5 (3) MG/3ML IN SOLN: 3.0000 mL | Freq: Once | RESPIRATORY_TRACT | Status: DC

## 2016-12-03 MED ORDER — ALBUTEROL SULFATE (2.5 MG/3ML) 0.083% IN NEBU: 2.5000 mg | INHALATION_SOLUTION | Freq: Once | RESPIRATORY_TRACT | Status: DC

## 2016-12-03 MED ORDER — ALBUTEROL SULFATE (2.5 MG/3ML) 0.083% IN NEBU
2.5000 mg | INHALATION_SOLUTION | Freq: Once | RESPIRATORY_TRACT | Status: AC
  Administered 2016-12-03: 2.5 mg via RESPIRATORY_TRACT

## 2016-12-03 NOTE — Progress Notes (Signed)
Douglas Monroe    [id number]    09-05-1932  Primary Care Physician:Sanders, Bailey Mech, MD  Referring Physician: Glendale Chard, [address] [place], [place] [address]  Chief complaint: Consult for evaluation of dyspnea, request for surgical clearance  HPI: 83 year old with history of COPD, hypertension, abdominal aortic aneurysm GERD, sleep apnea, renal artery stenosis,  peripheral vascular disease Seen in the ED on 10/21 for dyspnea, possible allergic reaction to albuterol.  He was taking albuterol every 2 hours for COPD exacerbation and reports feeling of throat closing up and rash on hands and genital area.  He took Benadryl and Pepcid at home with improvement He had been treated in the ED with with prednisone and ipratropium inhaler for his breathing.  He has been taken of albuterol and given Atrovent instead.  He is also on Symbicort which is started a month ago.  He reports good response to that He was on Spiriva but was taken off by his primary care at the time Symbicort was initiated.  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  He has a planned cystoscopy with pyelogram, ureteroscopy with tumor ablation and stent placement in December 2018 and has been sent here for surgical clearance.  Occupation: Retired Games developer Exposures: No known exposures Smoking history: 90-pack-year smoking history.  Quit in 2012 Travel History: Not significant  Outpatient Encounter Prescriptions as of 12/03/2016  Medication Sig  . amLODipine (NORVASC) 5 MG tablet Take 5 mg by mouth daily.   [name] atorvastatin (LIPITOR) 20 MG tablet TAKE 1 TABLET BY MOUTH  DAILY AT 6 PM. (Patient taking differently: Take 20mg  by mouth daily)  . budesonide-formoterol (SYMBICORT) 160-4.5 MCG/ACT inhaler Inhale 2 puffs into the lungs 2 (two) times daily.  [name] BYSTOLIC 10 MG tablet Take 10 mg by mouth  daily.  . clopidogrel (PLAVIX) 75 MG tablet Take 75 mg by mouth daily.  . [employer] D3 2000 units CAPS Take 2,000 Units by mouth daily.   [name] dextromethorphan (DELSYM) 30 MG/5ML liquid Take 30 mg by mouth daily as needed for cough.   . finasteride (PROSCAR) 5 MG tablet Take 5 mg by mouth daily.   [name] ipratropium (ATROVENT HFA) 17 MCG/ACT inhaler Inhale 2 puffs into the lungs every 4 (four) hours as needed for wheezing (or shortness of breath).  [name] ipratropium (ATROVENT) 0.02 % nebulizer solution Take 2.5 mLs (0.5 mg total) by nebulization every 4 (four) hours as needed for wheezing or shortness of breath.  . oxyCODONE-acetaminophen (PERCOCET/ROXICET) 5-325 MG tablet Take 1 tablet by mouth every 4 (four) hours as needed for moderate pain.  . pantoprazole (PROTONIX) 40 MG tablet Take 40 mg by mouth daily.  . tamsulosin (FLOMAX) 0.4 MG CAPS capsule Take 0.4 mg by mouth daily.   [name] tiotropium (SPIRIVA) 18 MCG inhalation capsule Place 18 mcg into inhaler and inhale daily.  . [DISCONTINUED] predniSONE (DELTASONE) 50 MG tablet Take 1 tablet (50 mg total) by mouth daily.   No facility-administered encounter medications on file as of 12/03/2016.     Allergies as of 12020/08/2416 - Review Complete 12020/08/2416  Allergen Reaction Noted  . Albuterol sulfate Hives and Swelling 11/21/2016  . Pneumococcal vaccines Swelling 10/04/2013    Past Medical History:  Diagnosis Date  . Abdominal aortic aneurysm ([place])    infrarenal -- monitored by dr berry (note states stable 4.0 to 4.1cm)  .  Anxiety    situational  . Arthritis    "hands" (03/15/2016)  . Cancer of skin of temple 2017   right  . COPD with emphysema ([place])   . Dyspnea    with exertion  . Exertional dyspnea   . First degree heart block   . GERD (gastroesophageal reflux disease)   . Headache(784.0)    uses goody powder; "maybe weekly, maybe not that much" (03/15/2016)  . History of kidney stones   . HOH (hard of hearing)    left ear; no hearing aid   . HTN  (hypertension) 08/20/2011  . Hyperlipidemia   . Hypertension   . Ischemic colitis ([place])   . Left carotid artery stenosis    moderate  . Migraine    "long time ago" (03/15/2016)  . 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 PVD (peripheral vascular disease) with claudication [facility]) cardiologist-  dr berry   s/p bilateral iliac stents and right sfa stenting/  doppper study 09-05-2012  revealed ABIs close to one bilaterally with patent stents/  stenting left renal artery stenosis   . Right ureteral stone   . S/P arterial stent    left renal angioplasty and stent 11-02-2012  . Sleep apnea    at risk for OSA. stop/bang score= 6; sent to PCP 10/04/13; denies use of mask on 03/15/2016  . Tubular adenoma of colon     Past Surgical History:  Procedure Laterality Date  . ABDOMINAL ANGIOGRAM  08/19/2011   Left common iliac artery at the ostium, 6x45mm ICAST covered stent, common femoral artery, 8x150mm Abbott absolute pro nitinol self-expanding stent and resulting in reduction of 80 and 90% stenosis to 0% residual  . ABDOMINAL ANGIOGRAM  10/08/2010   Mid-right SFA, 6x120 EV3 Protege nitinol self-expanding stent, resulting in reduction of CTO to 0% residual  . ABDOMINAL ANGIOGRAM  06/22/2010   Rt Common Femoral Artery-8x3 Absolute Pro Nitinol self-expanding stent-90% stenosis to 0% residual; Rt External Iliac Artery-9x3 Absolute Pro Nitinol self-expanding stent-70% stenosis to 0% residual; Left External Iliac Artery-8x4 Absolute Pro Nitinol self-expanding stent-90% to 0% residual  . ATHERECTOMY N/A 08/19/2011   Procedure: ATHERECTOMY;  Surgeon: Lorretta Harp, MD;  Location: [facility] CATH LAB;  Service: Cardiovascular;  Laterality: N/A;  . BACK SURGERY    . CARDIAC CATHETERIZATION  10/26/2002  dr Tami Ribas   normal coronary arteries/ normal  LVF  . CARDIOVASCULAR STRESS TEST  06-12-2010   dr berry   normal perfusion study/ no ischemia/  ef 69%  . COLONOSCOPY WITH PROPOFOL N/A 03/19/2016     Procedure: COLONOSCOPY WITH PROPOFOL;  Surgeon: Mauri Pole, MD;  Location: [facility];  Service: Endoscopy;  Laterality: N/A;  was inpatient, but got d/c'd home and is coming back as an OP  . CYSTOSCOPY W/ URETERAL STENT PLACEMENT Left 06/07/2016   Procedure: CYSTOSCOPY WITH RETROGRADE PYELOGRAM/URETERAL STENT REPLACEMENT;  Surgeon: Cleon Gustin, MD;  Location: [facility];  Service: Urology;  Laterality: Left;  . CYSTOSCOPY W/ URETERAL STENT PLACEMENT Left 09/20/2016   Procedure: CYSTOSCOPY WITH RETROGRADE PYELOGRAM/URETERAL STENT PLACEMENT;  Surgeon: Cleon Gustin, MD;  Location: [facility];  Service: Urology;  Laterality: Left;  . CYSTOSCOPY WITH RETROGRADE PYELOGRAM, URETEROSCOPY AND STENT PLACEMENT Right 10/10/2013   Procedure: uretheral dilation, CYSTOSCOPY, URETEROSCOPY, stone extraction, STENT PLACEMENT;  Surgeon: [name], MD;  Location[facility];  Service: Urology;  Laterality: Right;  . CYSTOSCOPY WITH URETEROSCOPY Left 05/09/2016   Procedure: CYSTOSCOPY, RETROGRADE, LEFT  URETEROSCOPY, LEFT URETERAL BIOPSIES, HOLMIUM LASER FULGERATION, LEFT URETERAL STENT PLACEMENT;  Surgeon: Irine Seal, MD;  Location: [facility];  Service: Urology;  Laterality: Left;  . CYSTOSCOPY/URETEROSCOPY/HOLMIUM LASER Left 09/20/2016   Procedure: CYSTOSCOPY/URETEROSCOPY/HOLMIUM LASER;  Surgeon: Cleon Gustin, MD;  Location: [facility];  Service: Urology;  Laterality: Left;  . HOLMIUM LASER APPLICATION Right 04/09/2503   Procedure: HOLMIUM LASER APPLICATION;  Surgeon: [name], MD;  [facility]  Service: Urology;  Laterality: Right;  . HOLMIUM LASER APPLICATION Left 04/10/7671   Procedure: TUMOR ABLATION WITH HOLMIUM LASER;  Surgeon: Cleon Gustin, MD;  Location: [facility];  Service: Urology;  Laterality: Left;  . LUMBAR DISC SURGERY  1970's  . RENAL ANGIOGRAM N/A 11/02/2012   Procedure: RENAL ANGIOGRAM;  Surgeon: Lorretta Harp, MD;  Location: [facility] CATH  LAB;  Service: Cardiovascular;  Laterality: N/A;  . RENAL ARTERY STENT Left 11/02/2012  . SKIN CANCER EXCISION Right 2017   temple  . SOFT TISSUE CYST EXCISION  1960's   "outside part of lung LLL"    Family History  Problem Relation Age of Onset  . Hypertension Mother   . Cancer Father   . Diabetes Brother   . Hypertension Son     Social History   Social History  . Marital status: Married    Spouse name: N/A  . Number of children: N/A  . Years of education: N/A   Occupational History  . Not on file.   Social History Main Topics  . Smoking status: Former Smoker    Packs/day: 1.00    Years: 60.00    Types: Cigarettes    Quit date: 06/02/2010  . Smokeless tobacco: Never Used  . Alcohol use No  . Drug use: No  . Sexual activity: Not Currently   Other Topics Concern  . Not on file   Social History Narrative  . No narrative on file    Review of systems: Review of Systems  Constitutional: Negative for fever and chills.  HENT: Negative.   Eyes: Negative for blurred vision.  Respiratory: as per HPI  Cardiovascular: Negative for chest pain and palpitations.  Gastrointestinal: Negative for vomiting, diarrhea, blood per rectum. Genitourinary: Negative for dysuria, urgency, frequency and hematuria.  Musculoskeletal: Negative for myalgias, back pain and joint pain.  Skin: Negative for itching and rash.  Neurological: Negative for dizziness, tremors, focal weakness, seizures and loss of consciousness.  Endo/Heme/Allergies: Negative for environmental allergies.  Psychiatric/Behavioral: Negative for depression, suicidal ideas and hallucinations.  All other systems reviewed and are negative.  Physical Exam: Blood pressure 126/74, pulse 74, height 5' 6.5" (1.689 m), weight 141 lb 6.4 oz (64.1 kg), SpO2 94 %. Gen:      No acute distress HEENT:  EOMI, sclera anicteric Neck:     No masses; no thyromegaly Lungs:    Clear to auscultation bilaterally; normal respiratory effort,  Lt chest thoracotomy scar CV:         Regular rate and rhythm; no murmurs Abd:      + bowel sounds; soft, non-tender; no palpable masses, no distension Ext:    No edema; adequate peripheral perfusion Skin:      Warm and dry; no rash Neuro: alert and oriented x 3 Psych: normal mood and affect  Data Reviewed: CT abdomen pelvis 05/18/16-lung images show scarring in the left base, 6.8 mm nodule which is new compared to prior. Chest x-ray 11/21/16-hyperinflation, chronic bronchitis.  Blunting of the left costophrenic angle.  No acute infiltrate or consolidation I have reviewed all images personally.  FENO 12/03/16- 18  Assessment:  COPD Likely COPD based on his extensive smoking history and symptoms Continue on Symbicort. Spiriva was held as he was taking Atrovent. I told him to start the Spiriva again and use Atrovent as a rescue medication He may have had allergic reaction to albuterol but I am not convinced of this as he had been on albuterol for many years before without any issues.  We gave him a dose of albuterol today in the office and he did not develop any reaction  Abnormal CT scan CT scan noted with chronic left base scarring.  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  There is a small 6.8 mm nodule in the left base which is new compared to prior. We will reevaluate this with a repeat scan  Clearance for urologic procedure His COPD appears to be under good control now.  He is at low-moderate risk of death perioperative complication  But has no contraindication to go ahead with procedure Recommend early mobilization, use of incentive spirometer, continuation of bronchodilator  Plan/Recommendations: - Continue Symbicort, start taking Spiriva - Use Atrovent as a rescue medication.  He can use albuterol as needed - Scheduled for CT without contrast and PFTs - Cleared for urologic procedure   Marshell Garfinkel MD [facility] Pager [phone] 12/03/2016, 2:10 PM  CC: [place] Chard, MD

## 2016-12-03 NOTE — Addendum Note (Signed)
Addended by: Maryanna Shape A on: 12/03/2016 03:44 PM   Modules accepted: Orders

## 2016-12-03 NOTE — Patient Instructions (Signed)
Continue the Symbicort It is okay to start taking the Spiriva Use the Atrovent as a rescue medication I am not convinced that you had a reaction to albuterol.  It is okay to try again.  Please call us or go to the ED if you develop a similar reaction Schedule you for a CT chest without contrast and pulmonary function test Follow-up in 1 month.

## 2016-12-13 ENCOUNTER — Ambulatory Visit (INDEPENDENT_AMBULATORY_CARE_PROVIDER_SITE_OTHER)
Admission: RE | Admit: 2016-12-13 | Discharge: 2016-12-13 | Disposition: A | Discharge status: Home / Self Care | Payer: Medicare Other | Source: Ambulatory Visit | Attending: Pulmonary Disease | Admitting: Pulmonary Disease

## 2016-12-13 DIAGNOSIS — R918 Other nonspecific abnormal finding of lung field: Secondary | ICD-10-CM | POA: Diagnosis not present

## 2016-12-14 ENCOUNTER — Telehealth: Payer: Self-pay | Admitting: Pulmonary Disease

## 2016-12-14 ENCOUNTER — Other Ambulatory Visit: Payer: Self-pay

## 2016-12-14 MED ORDER — LEVOFLOXACIN 750 MG PO TABS: 750.0000 mg | ORAL_TABLET | Freq: Every day | ORAL | 0 refills | Status: DC

## 2016-12-14 NOTE — Telephone Encounter (Signed)
Notes recorded by Marshell Garfinkel, MD on 12/14/2016 at 9:44 AM EST CT scan noted with new lung nodule. Given his recent diagnosis of transitional cell cancer we need to evaluate for malignancy We will give him 1 more course of antibiotic with Levaquin 750 a day for 7 days. Will meet in office in Dec to review and order follow up PET scan Will try to get super D CT made in case he needs a nav biopsy.      Called [name] and advised message from the provider. Sheunderstood and verbalized understanding. Nothing further is needed.

## 2016-12-15 ENCOUNTER — Telehealth: Payer: Self-pay | Admitting: Pulmonary Disease

## 2016-12-15 NOTE — Telephone Encounter (Signed)
Spoke with pt's spouse, [name]. [name] is concerned about Levaquin side effects. [name] wanted to know if it's okay for pt to take Levaquin with Protonix and Tylenol. [name] also states that pt has temp of 99.8 and would like to know how high pt's temp should get before taking him to ED?  RA please advise, as PM is unavailable. Thanks.

## 2016-12-15 NOTE — Telephone Encounter (Signed)
Called spoke with patient's spouse [name] and discussed RA's recommendations [name] voiced her understanding and denied any questions/concerns [name] did state that pt began feeling better after speaking with Margie earlier and his temp is now back down to 63 the last time she checked  [name] is aware to contact the office or go to the ER if patient's symptoms do not improve or they worsen Nothing further needed; will sign off

## 2016-12-15 NOTE — Telephone Encounter (Signed)
Patient returned call [phone] regarding questions about antibiotic.

## 2016-12-15 NOTE — Telephone Encounter (Signed)
Okay to take Levaquin with those medications. Temperature more than 101 would be concerning

## 2016-12-15 NOTE — Telephone Encounter (Signed)
ATC pt, no answer. Left message for pt to call back.  

## 2017-01-11 ENCOUNTER — Ambulatory Visit: Payer: Medicare Other | Admitting: Pulmonary Disease

## 2017-01-12 ENCOUNTER — Telehealth: Payer: Self-pay | Admitting: Pulmonary Disease

## 2017-01-12 ENCOUNTER — Other Ambulatory Visit: Payer: Self-pay | Admitting: Cardiovascular Disease

## 2017-01-12 DIAGNOSIS — R911 Solitary pulmonary nodule: Secondary | ICD-10-CM

## 2017-01-12 NOTE — Telephone Encounter (Signed)
Rx(s) sent to pharmacy electronically.  

## 2017-01-12 NOTE — Telephone Encounter (Signed)
Spoke with pt's wife, his appt was yesterday but due to the weather it was cancelled. PM can they wait until 02/18/16? Pt's wife thinks his condition time sensitive and Pm wanted to see pt before he saw Dr. Alyson Ingles. She also states he is supposed to have a whole body scan. Please advise.

## 2017-01-13 DIAGNOSIS — C669 Malignant neoplasm of unspecified ureter: Secondary | ICD-10-CM | POA: Diagnosis not present

## 2017-01-13 DIAGNOSIS — N401 Enlarged prostate with lower urinary tract symptoms: Secondary | ICD-10-CM | POA: Diagnosis not present

## 2017-01-13 NOTE — Telephone Encounter (Signed)
Please order PET/CT. Thanks

## 2017-01-13 NOTE — Telephone Encounter (Signed)
Spoke with pt's spouse, aware of recs.  PET ordered.  Nothing further needed.

## 2017-01-17 ENCOUNTER — Telehealth: Payer: Self-pay | Admitting: Pulmonary Disease

## 2017-01-17 DIAGNOSIS — I1310 Hypertensive heart and chronic kidney disease without heart failure, with stage 1 through stage 4 chronic kidney disease, or unspecified chronic kidney disease: Secondary | ICD-10-CM | POA: Diagnosis not present

## 2017-01-17 DIAGNOSIS — N182 Chronic kidney disease, stage 2 (mild): Secondary | ICD-10-CM | POA: Diagnosis not present

## 2017-01-17 DIAGNOSIS — I2510 Atherosclerotic heart disease of native coronary artery without angina pectoris: Secondary | ICD-10-CM | POA: Diagnosis not present

## 2017-01-17 NOTE — Telephone Encounter (Signed)
Attempted to contact [facility]. I received no answer and I could not leave a message. Will try back.

## 2017-01-18 NOTE — Telephone Encounter (Signed)
lmtcb for Douglas Monroe at [facility]- need this form refaxed to office.

## 2017-01-18 NOTE — Telephone Encounter (Signed)
Pam from [facility] returned the call.  She will refax the form to our office.  No call back is needed.

## 2017-01-21 ENCOUNTER — Other Ambulatory Visit: Payer: Self-pay | Admitting: Urology

## 2017-01-29 ENCOUNTER — Other Ambulatory Visit: Payer: Self-pay

## 2017-01-29 ENCOUNTER — Emergency Department (HOSPITAL_COMMUNITY)
Admission: EM | Admit: 2017-01-29 | Discharge: 2017-01-29 | Disposition: A | Discharge status: Home / Self Care | Payer: Medicare Other | Attending: Emergency Medicine | Admitting: Emergency Medicine

## 2017-01-29 ENCOUNTER — Encounter (HOSPITAL_COMMUNITY): Payer: Self-pay | Admitting: Emergency Medicine

## 2017-01-29 DIAGNOSIS — Z87891 Personal history of nicotine dependence: Secondary | ICD-10-CM | POA: Insufficient documentation

## 2017-01-29 DIAGNOSIS — I10 Essential (primary) hypertension: Secondary | ICD-10-CM | POA: Insufficient documentation

## 2017-01-29 DIAGNOSIS — E7800 Pure hypercholesterolemia, unspecified: Secondary | ICD-10-CM | POA: Diagnosis not present

## 2017-01-29 DIAGNOSIS — Z9181 History of falling: Secondary | ICD-10-CM | POA: Diagnosis not present

## 2017-01-29 DIAGNOSIS — M545 Low back pain: Secondary | ICD-10-CM | POA: Diagnosis not present

## 2017-01-29 DIAGNOSIS — Z7902 Long term (current) use of antithrombotics/antiplatelets: Secondary | ICD-10-CM | POA: Diagnosis not present

## 2017-01-29 DIAGNOSIS — J449 Chronic obstructive pulmonary disease, unspecified: Secondary | ICD-10-CM | POA: Insufficient documentation

## 2017-01-29 DIAGNOSIS — R21 Rash and other nonspecific skin eruption: Secondary | ICD-10-CM | POA: Diagnosis present

## 2017-01-29 DIAGNOSIS — E559 Vitamin D deficiency, unspecified: Secondary | ICD-10-CM | POA: Diagnosis not present

## 2017-01-29 DIAGNOSIS — G8929 Other chronic pain: Secondary | ICD-10-CM | POA: Diagnosis not present

## 2017-01-29 DIAGNOSIS — Z79899 Other long term (current) drug therapy: Secondary | ICD-10-CM | POA: Diagnosis not present

## 2017-01-29 DIAGNOSIS — L509 Urticaria, unspecified: Secondary | ICD-10-CM | POA: Diagnosis not present

## 2017-01-29 DIAGNOSIS — I739 Peripheral vascular disease, unspecified: Secondary | ICD-10-CM | POA: Diagnosis not present

## 2017-01-29 DIAGNOSIS — I1310 Hypertensive heart and chronic kidney disease without heart failure, with stage 1 through stage 4 chronic kidney disease, or unspecified chronic kidney disease: Secondary | ICD-10-CM | POA: Diagnosis not present

## 2017-01-29 DIAGNOSIS — I2510 Atherosclerotic heart disease of native coronary artery without angina pectoris: Secondary | ICD-10-CM | POA: Diagnosis not present

## 2017-01-29 DIAGNOSIS — N183 Chronic kidney disease, stage 3 (moderate): Secondary | ICD-10-CM | POA: Diagnosis not present

## 2017-01-29 NOTE — ED Notes (Signed)
Pt has a rash noted on  His abdomen area is red and itchy. Pt reports that itching and redness has decreased.

## 2017-01-29 NOTE — ED Provider Notes (Signed)
[facility] DEPT Provider Note   CSN: [id number] Arrival date & time: 01/29/17  0037     History   Chief Complaint Chief Complaint  Patient presents with  . Rash    HPI Douglas Monroe is a 83 y.o. male.  Patient presents with wife after her broke out into a rash earlier tonight. She describes the rash as generalized, red, raised whelps and c/w recurrent rash he has had diagnosed as allergic reaction. No SOB, or swelling of lips or tongue. Per wife, he was given a Benadryl and Pepcid at home and symptoms have now resolved. No nausea, vomiting, fever.    The history is provided by the patient. No language interpreter was used.  Rash      Past Medical History:  Diagnosis Date  . Abdominal aortic aneurysm ([place])    infrarenal -- monitored by dr berry (note states stable 4.0 to 4.1cm)  . Anxiety    situational  . Arthritis    "hands" (03/15/2016)  . Cancer of skin of temple 2017   right  . COPD with emphysema ([place])   . Dyspnea    with exertion  . Exertional dyspnea   . First degree heart block   . GERD (gastroesophageal reflux disease)   . Headache(784.0)    uses goody powder; "maybe weekly, maybe not that much" (03/15/2016)  . History of kidney stones   . HOH (hard of hearing)    left ear; no hearing aid   . HTN (hypertension) 08/20/2011  . Hyperlipidemia   . Hypertension   . Ischemic colitis ([place])   . Left carotid artery stenosis    moderate  . Migraine    "long time ago" (03/15/2016)  . 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 PVD (peripheral vascular disease) with claudication [facility]) cardiologist-  dr berry   s/p bilateral iliac stents and right sfa stenting/  doppper study 09-05-2012  revealed ABIs close to one bilaterally with patent stents/  stenting left renal artery stenosis   . Right ureteral stone   . S/P arterial stent    left renal angioplasty and stent 11-02-2012  . Sleep apnea    at risk for  OSA. stop/bang score= 6; sent to PCP 10/04/13; denies use of mask on 03/15/2016  . Tubular adenoma of colon     Patient Active Problem List   Diagnosis Date Noted  . Hydronephrosis 05/08/2016  . Acute renal failure superimposed on stage 2 chronic kidney disease ([place]) 05/08/2016  . Polyp of cecum   . Polyp of transverse colon   . BRBPR (bright red blood per rectum)   . Hematochezia   . Ischemic colitis ([place])   . Ulceration, colon   . Left sided abdominal pain   . Rectal bleeding 03/15/2016  . Acute hyperglycemia 03/15/2016  . COPD exacerbation ([place])   . Renal insufficiency   . PVD (peripheral vascular disease) ([place])   . Ureteral calculus 10/10/2013  . Hyperlipidemia 07/24/2013  . S/P arterial stent, to Lt renal art. 11/02/12 11/03/2012  . Carotid artery disease ([place]) 10/06/2012  . Renal artery stenosis, progression of disease 10/06/2012  . Abdominal aortic aneurysm ([place]) 10/06/2012  . Normal coronary arteries-  07/21/2012  . PVD (peripheral vascular disease) with claudication, previous stents to bilateral iliacs. Rt. SFA also.  08/20/2011  . Claudication of both lower extremities ([place]) 08/20/2011  . PVD S/P multiple PCIs 08/20/2011  . COPD (chronic obstructive pulmonary disease) ([place]) 08/20/2011  .  HTN (hypertension) 08/20/2011    Past Surgical History:  Procedure Laterality Date  . ABDOMINAL ANGIOGRAM  08/19/2011   Left common iliac artery at the ostium, 6x62mm ICAST covered stent, common femoral artery, 8x181mm Abbott absolute pro nitinol self-expanding stent and resulting in reduction of 80 and 90% stenosis to 0% residual  . ABDOMINAL ANGIOGRAM  10/08/2010   Mid-right SFA, 6x120 EV3 Protege nitinol self-expanding stent, resulting in reduction of CTO to 0% residual  . ABDOMINAL ANGIOGRAM  06/22/2010   Rt Common Femoral Artery-8x3 Absolute Pro Nitinol self-expanding stent-90% stenosis to 0% residual; Rt External Iliac Artery-9x3 Absolute Pro Nitinol self-expanding stent-70% stenosis  to 0% residual; Left External Iliac Artery-8x4 Absolute Pro Nitinol self-expanding stent-90% to 0% residual  . ATHERECTOMY N/A 08/19/2011   Procedure: ATHERECTOMY;  Surgeon: Lorretta Harp, MD;  Location: [facility] CATH LAB;  Service: Cardiovascular;  Laterality: N/A;  . BACK SURGERY    . CARDIAC CATHETERIZATION  10/26/2002  dr Tami Ribas   normal coronary arteries/ normal  LVF  . CARDIOVASCULAR STRESS TEST  06-12-2010   dr berry   normal perfusion study/ no ischemia/  ef 69%  . COLONOSCOPY WITH PROPOFOL N/A 03/19/2016   Procedure: COLONOSCOPY WITH PROPOFOL;  Surgeon: Mauri Pole, MD;  Location: [facility] ENDOSCOPY;  Service: Endoscopy;  Laterality: N/A;  was inpatient, but got d/c'd home and is coming back as an OP  . CYSTOSCOPY W/ URETERAL STENT PLACEMENT Left 06/07/2016   Procedure: CYSTOSCOPY WITH RETROGRADE PYELOGRAM/URETERAL STENT REPLACEMENT;  Surgeon: Cleon Gustin, MD;  Location: [facility] ORS;  Service: Urology;  Laterality: Left;  . CYSTOSCOPY W/ URETERAL STENT PLACEMENT Left 09/20/2016   Procedure: CYSTOSCOPY WITH RETROGRADE PYELOGRAM/URETERAL STENT PLACEMENT;  Surgeon: Cleon Gustin, MD;  Location: [facility] ORS;  Service: Urology;  Laterality: Left;  . CYSTOSCOPY WITH RETROGRADE PYELOGRAM, URETEROSCOPY AND STENT PLACEMENT Right 10/10/2013   Procedure: uretheral dilation, CYSTOSCOPY, URETEROSCOPY, stone extraction, STENT PLACEMENT;  Surgeon: [name], MD;  Location: [facility];  Service: Urology;  Laterality: Right;  . CYSTOSCOPY WITH URETEROSCOPY Left 05/09/2016   Procedure: CYSTOSCOPY, RETROGRADE, LEFT URETEROSCOPY, LEFT URETERAL BIOPSIES, HOLMIUM LASER FULGERATION, LEFT URETERAL STENT PLACEMENT;  Surgeon: Irine Seal, MD;  Location: [facility];  Service: Urology;  Laterality: Left;  . CYSTOSCOPY/URETEROSCOPY/HOLMIUM LASER Left 09/20/2016   Procedure: CYSTOSCOPY/URETEROSCOPY/HOLMIUM LASER;  Surgeon: Cleon Gustin, MD;  Location: [facility];  Service: Urology;  Laterality: Left;  .  HOLMIUM LASER APPLICATION Right 07/03/3760   Procedure: HOLMIUM LASER APPLICATION;  Surgeon: [name], MD;  Location: [facility];  Service: Urology;  Laterality: Right;  . HOLMIUM LASER APPLICATION Left 09/04/1515   Procedure: TUMOR ABLATION WITH HOLMIUM LASER;  Surgeon: Cleon Gustin, MD;  Location: [facility];  Service: Urology;  Laterality: Left;  . LUMBAR DISC SURGERY  1970's  . RENAL ANGIOGRAM N/A 11/02/2012   Procedure: RENAL ANGIOGRAM;  Surgeon: Lorretta Harp, MD;  Location: [facility] CATH LAB;  Service: Cardiovascular;  Laterality: N/A;  . RENAL ARTERY STENT Left 11/02/2012  . SKIN CANCER EXCISION Right 2017   temple  . SOFT TISSUE CYST EXCISION  1960's   "outside part of lung LLL"       Home Medications    Prior to Admission medications   Medication Sig Start Date End Date Taking? Authorizing Provider  albuterol (PROVENTIL HFA;VENTOLIN HFA) 108 (90 Base) MCG/ACT inhaler Inhale 2 puffs into the lungs every 6 (six) hours as needed for wheezing or shortness of breath.  [provider]  albuterol (PROVENTIL) (2.5 MG/3ML) 0.083% nebulizer solution Take 2.5 mg by nebulization every 6 (six) hours as needed for wheezing or shortness of breath.    [provider]  amLODipine (NORVASC) 5 MG tablet Take 5 mg by mouth daily.     [provider]  Aspirin-Acetaminophen-Caffeine (GOODY HEADACHE PO) Take 1 packet by mouth daily as needed (for headache).    [provider]  atorvastatin (LIPITOR) 20 MG tablet TAKE 1 TABLET BY MOUTH  DAILY AT 6 PM. Patient taking differently: TAKE 20 MG BY MOUTH  DAILY AT 6 PM. 01/12/17   Lorretta Harp, MD  budesonide-formoterol [facility]) 160-4.5 MCG/ACT inhaler Inhale 2 puffs into the lungs 2 (two) times daily.    [provider]  BYSTOLIC 10 MG tablet Take 10 mg by mouth daily. 02/23/16   [provider]  clopidogrel (PLAVIX) 75 MG tablet TAKE 1 TABLET BY MOUTH  EVERY DAY Patient taking  differently: TAKE 75 MG BY MOUTH  EVERY DAY 01/12/17   Lorretta Harp, MD  [employer] D3 2000 units CAPS Take 2,000 Units by mouth daily.  01/31/16   [provider]  dextromethorphan (DELSYM) 30 MG/5ML liquid Take 30 mg by mouth daily as needed for cough.     [provider]  finasteride (PROSCAR) 5 MG tablet Take 5 mg by mouth daily.     [provider]  ipratropium (ATROVENT HFA) 17 MCG/ACT inhaler Inhale 2 puffs into the lungs every 4 (four) hours as needed for wheezing (or shortness of breath). Patient not taking: Reported on 94/76/5465 03/54/65   Delora Fuel, MD  ipratropium (ATROVENT) 0.02 % nebulizer solution Take 2.5 mLs (0.5 mg total) by nebulization every 4 (four) hours as needed for wheezing or shortness of breath. 68/12/75   Delora Fuel, MD  Magnesium 500 MG CAPS Take 500 mg by mouth daily.    [provider]  oxyCODONE-acetaminophen (PERCOCET/ROXICET) 5-325 MG tablet Take 1 tablet by mouth every 4 (four) hours as needed for moderate pain. Patient not taking: Reported on 01/28/2017 09/20/16   Cleon Gustin, MD  pantoprazole (PROTONIX) 40 MG tablet Take 40 mg by mouth daily. 01/21/16   [provider]  tamsulosin (FLOMAX) 0.4 MG CAPS capsule Take 0.4 mg by mouth daily.  07/13/13   [provider]  tiotropium (SPIRIVA) 18 MCG inhalation capsule Place 18 mcg into inhaler and inhale daily.    [provider]    Family History Family History  Problem Relation Age of Onset  . Hypertension Mother   . Cancer Father   . Diabetes Brother   . Hypertension Son     Social History Social History   Tobacco Use  . Smoking status: Former Smoker    Packs/day: 1.00    Years: 60.00    Pack years: 60.00    Types: Cigarettes    Last attempt to quit: 06/02/2010    Years since quitting: 6.6  . Smokeless tobacco: Never Used  Substance Use Topics  . Alcohol use: No  . Drug use: No     Allergies   Pneumococcal  vaccines   Review of Systems Review of Systems  Constitutional: Negative for diaphoresis.  HENT: Negative for facial swelling and trouble swallowing.   Respiratory: Negative for shortness of breath.   Cardiovascular: Negative for chest pain.  Gastrointestinal: Negative for nausea and vomiting.  Musculoskeletal: Negative for myalgias.  Skin: Positive for rash.  Neurological: Negative for light-headedness.  Physical Exam Updated Vital Signs BP 132/83 (BP Location: Left Arm)   Pulse 74   Temp 98.1 F (36.7 C) (Oral)   Resp 16   SpO2 92%   Physical Exam  Constitutional: He is oriented to person, place, and time. He appears well-developed and well-nourished.  HENT:  Head: Normocephalic.  No facial swelling of lips or tongue.  Neck: Normal range of motion. Neck supple.  Cardiovascular: Normal rate and regular rhythm.  Pulmonary/Chest: Effort normal and breath sounds normal. No stridor. He has no wheezes. He has no rales.  Abdominal: Soft. Bowel sounds are normal. There is no tenderness. There is no rebound and no guarding.  Musculoskeletal: Normal range of motion.  Neurological: He is alert and oriented to person, place, and time.  Skin: Skin is warm and dry. No rash noted.  Faint residual rash to abdomen.  Psychiatric: He has a normal mood and affect.     ED Treatments / Results  Labs (all labs ordered are listed, but only abnormal results are displayed) Labs Reviewed - No data to display  EKG  EKG Interpretation None       Radiology No results found.  Procedures Procedures (including critical care time)  Medications Ordered in ED Medications - No data to display   Initial Impression / Assessment and Plan / ED Course  I have reviewed the triage vital signs and the nursing notes.  Pertinent labs & imaging results that were available during my care of the patient were reviewed by me and considered in my medical decision making (see chart for  details).     Patient with recurrent rash c/w hives by description. No anaphylaxis. Treated appropriately with resolution.   Final Clinical Impressions(s) / ED Diagnoses   Final diagnoses:  None   1. [facility]   ED Discharge Orders    None       Charlann Lange, Hershal Coria 01/29/17 Taylor, April, MD 01/29/17 [phone]

## 2017-01-29 NOTE — ED Notes (Signed)
Patient asking for update. Spoke with EDP, no new orders at this time.

## 2017-01-29 NOTE — ED Notes (Signed)
Pt is alert and oriented x 4 and is verbally responsive, accompanied with spoue. Pt  Wife reports that if the provider does not come soon they are going to leave. Provider has been made  aware.

## 2017-01-29 NOTE — ED Triage Notes (Signed)
Patient states the rash he has on his abdominal might have came from laundry detergent. Patient states he took Pepcid and benadryl. The rash is going away.

## 2017-02-02 ENCOUNTER — Ambulatory Visit (HOSPITAL_COMMUNITY)
Admission: RE | Admit: 2017-02-02 | Discharge: 2017-02-02 | Disposition: A | Discharge status: Home / Self Care | Payer: Medicare Other | Source: Ambulatory Visit | Attending: Pulmonary Disease | Admitting: Pulmonary Disease

## 2017-02-02 DIAGNOSIS — R911 Solitary pulmonary nodule: Secondary | ICD-10-CM

## 2017-02-02 DIAGNOSIS — I700 Atherosclerosis of aorta: Secondary | ICD-10-CM | POA: Diagnosis not present

## 2017-02-02 DIAGNOSIS — R918 Other nonspecific abnormal finding of lung field: Secondary | ICD-10-CM | POA: Diagnosis not present

## 2017-02-02 LAB — GLUCOSE, CAPILLARY: GLUCOSE-CAPILLARY: 95 mg/dL (ref 65–99)

## 2017-02-02 MED ORDER — FLUDEOXYGLUCOSE F - 18 (FDG) INJECTION
7.0000 | Freq: Once | INTRAVENOUS | Status: AC | PRN
Start: 2017-02-02 — End: 2017-02-02
  Administered 2017-02-02: 7 via INTRAVENOUS

## 2017-02-04 NOTE — Patient Instructions (Addendum)
Douglas Monroe  02/04/2017   Your procedure is scheduled on: 02-08-17   Report to [facility]  Entrance Report to admitting at  10:30 AM   Call this number if you have problems the morning of surgery [phone]    Remember: Do not eat food or drink liquids :After Midnight.     Take these medicines the morning of surgery with A SIP OF WATER: Amlodipine (Norvasc), Pantoprazole (Protonix), and Bystolic (Nebivolol). You may also bring and use your inhaler                                You may not have any metal on your body including hair pins and              piercings  Do not wear jewelry, lotions, powders or deodorant             Men may shave face and neck.   Do not bring valuables to the hospital. [place].  Contacts, dentures or bridgework may not be worn into surgery.      Patients discharged the day of surgery will not be allowed to drive home.  Name and phone number of your driver: [name] [phone]                Please read over the following fact sheets you were given: _____________________________________________________________________             [facility] - Preparing for Surgery Before surgery, you can play an important role.  Because skin is not sterile, your skin needs to be as free of germs as possible.  You can reduce the number of germs on your skin by washing with CHG (chlorahexidine gluconate) soap before surgery.  CHG is an antiseptic cleaner which kills germs and bonds with the skin to continue killing germs even after washing. Please DO NOT use if you have an allergy to CHG or antibacterial soaps.  If your skin becomes reddened/irritated stop using the CHG and inform your nurse when you arrive at Short Stay. Do not shave (including legs and underarms) for at least 48 hours prior to the first CHG shower.  You may shave your face/neck. Please follow these instructions  carefully:  1.  Shower with CHG Soap the night before surgery and the  morning of Surgery.  2.  If you choose to wash your hair, wash your hair first as usual with your  normal  shampoo.  3.  After you shampoo, rinse your hair and body thoroughly to remove the  shampoo.                           4.  Use CHG as you would any other liquid soap.  You can apply chg directly  to the skin and wash                       Gently with a scrungie or clean washcloth.  5.  Apply the CHG Soap to your body ONLY FROM THE NECK DOWN.   Do not use on face/ open  Wound or open sores. Avoid contact with eyes, ears mouth and genitals (private parts).                       Wash face,  Genitals (private parts) with your normal soap.             6.  Wash thoroughly, paying special attention to the area where your surgery  will be performed.  7.  Thoroughly rinse your body with warm water from the neck down.  8.  DO NOT shower/wash with your normal soap after using and rinsing off  the CHG Soap.                9.  Pat yourself dry with a clean towel.            10.  Wear clean pajamas.            11.  Place clean sheets on your bed the night of your first shower and do not  sleep with pets. Day of Surgery : Do not apply any lotions/deodorants the morning of surgery.  Please wear clean clothes to the hospital/surgery center.  FAILURE TO FOLLOW THESE INSTRUCTIONS MAY RESULT IN THE CANCELLATION OF YOUR SURGERY PATIENT SIGNATURE_________________________________  NURSE SIGNATURE__________________________________  ________________________________________________________________________

## 2017-02-04 NOTE — Progress Notes (Signed)
11-21-16 (Epic) EKG, CXR  10-24-16 Cardiac clearance in telephone encounter

## 2017-02-07 ENCOUNTER — Encounter (HOSPITAL_COMMUNITY): Payer: Self-pay

## 2017-02-07 ENCOUNTER — Encounter (HOSPITAL_COMMUNITY)
Admission: RE | Admit: 2017-02-07 | Discharge: 2017-02-07 | Disposition: A | Discharge status: Home / Self Care | Payer: Medicare Other | Source: Ambulatory Visit | Attending: Urology | Admitting: Urology

## 2017-02-07 ENCOUNTER — Other Ambulatory Visit: Payer: Self-pay

## 2017-02-07 DIAGNOSIS — E785 Hyperlipidemia, unspecified: Secondary | ICD-10-CM | POA: Diagnosis not present

## 2017-02-07 DIAGNOSIS — I739 Peripheral vascular disease, unspecified: Secondary | ICD-10-CM | POA: Diagnosis not present

## 2017-02-07 DIAGNOSIS — Z85828 Personal history of other malignant neoplasm of skin: Secondary | ICD-10-CM | POA: Diagnosis not present

## 2017-02-07 DIAGNOSIS — F418 Other specified anxiety disorders: Secondary | ICD-10-CM | POA: Diagnosis not present

## 2017-02-07 DIAGNOSIS — K219 Gastro-esophageal reflux disease without esophagitis: Secondary | ICD-10-CM | POA: Diagnosis not present

## 2017-02-07 DIAGNOSIS — Z887 Allergy status to serum and vaccine status: Secondary | ICD-10-CM | POA: Diagnosis not present

## 2017-02-07 DIAGNOSIS — G4730 Sleep apnea, unspecified: Secondary | ICD-10-CM | POA: Diagnosis not present

## 2017-02-07 DIAGNOSIS — Z87891 Personal history of nicotine dependence: Secondary | ICD-10-CM | POA: Diagnosis not present

## 2017-02-07 DIAGNOSIS — D4959 Neoplasm of unspecified behavior of other genitourinary organ: Secondary | ICD-10-CM | POA: Diagnosis not present

## 2017-02-07 DIAGNOSIS — I10 Essential (primary) hypertension: Secondary | ICD-10-CM | POA: Diagnosis not present

## 2017-02-07 DIAGNOSIS — J439 Emphysema, unspecified: Secondary | ICD-10-CM | POA: Diagnosis not present

## 2017-02-07 DIAGNOSIS — Z87442 Personal history of urinary calculi: Secondary | ICD-10-CM | POA: Diagnosis not present

## 2017-02-07 LAB — BASIC METABOLIC PANEL
ANION GAP: 4 — AB (ref 5–15)
BUN: 21 mg/dL — ABNORMAL HIGH (ref 6–20)
CALCIUM: 9.2 mg/dL (ref 8.9–10.3)
CHLORIDE: 111 mmol/L (ref 101–111)
CO2: 27 mmol/L (ref 22–32)
CREATININE: 1.26 mg/dL — AB (ref 0.61–1.24)
GFR calc non Af Amer: 51 mL/min — ABNORMAL LOW (ref >60)
GFR, EST AFRICAN AMERICAN: 60 mL/min — AB (ref >60)
Glucose, Bld: 103 mg/dL — ABNORMAL HIGH (ref 65–99)
Potassium: 4.4 mmol/L (ref 3.5–5.1)
SODIUM: 142 mmol/L (ref 135–145)

## 2017-02-07 LAB — CBC
HCT: 43.4 % (ref 39.0–52.0)
HEMOGLOBIN: 14.1 g/dL (ref 13.0–17.0)
MCH: 30.9 pg (ref 26.0–34.0)
MCHC: 32.5 g/dL (ref 30.0–36.0)
MCV: 95.2 fL (ref 78.0–100.0)
PLATELETS: 185 10*3/uL (ref 150–400)
RBC: 4.56 MIL/uL (ref 4.22–5.81)
RDW: 15.1 % (ref 11.5–15.5)
WBC: 7.5 10*3/uL (ref 4.0–10.5)

## 2017-02-08 ENCOUNTER — Other Ambulatory Visit: Payer: Self-pay

## 2017-02-08 ENCOUNTER — Ambulatory Visit (HOSPITAL_COMMUNITY): Payer: Medicare Other | Admitting: Anesthesiology

## 2017-02-08 ENCOUNTER — Encounter (HOSPITAL_COMMUNITY): Payer: Self-pay | Admitting: *Deleted

## 2017-02-08 ENCOUNTER — Ambulatory Visit (HOSPITAL_COMMUNITY)
Admission: RE | Admit: 2017-02-08 | Discharge: 2017-02-08 | Disposition: A | Discharge status: Home / Self Care | Payer: Medicare Other | Source: Ambulatory Visit | Attending: Urology | Admitting: Urology

## 2017-02-08 ENCOUNTER — Encounter (HOSPITAL_COMMUNITY)
Admission: RE | Disposition: A | Discharge status: Home / Self Care | Payer: Self-pay | Source: Ambulatory Visit | Attending: Urology

## 2017-02-08 ENCOUNTER — Telehealth: Payer: Self-pay | Admitting: Pulmonary Disease

## 2017-02-08 ENCOUNTER — Ambulatory Visit (HOSPITAL_COMMUNITY): Payer: Medicare Other

## 2017-02-08 DIAGNOSIS — Z887 Allergy status to serum and vaccine status: Secondary | ICD-10-CM | POA: Insufficient documentation

## 2017-02-08 DIAGNOSIS — I739 Peripheral vascular disease, unspecified: Secondary | ICD-10-CM | POA: Diagnosis not present

## 2017-02-08 DIAGNOSIS — F418 Other specified anxiety disorders: Secondary | ICD-10-CM | POA: Diagnosis not present

## 2017-02-08 DIAGNOSIS — I10 Essential (primary) hypertension: Secondary | ICD-10-CM | POA: Diagnosis not present

## 2017-02-08 DIAGNOSIS — K219 Gastro-esophageal reflux disease without esophagitis: Secondary | ICD-10-CM | POA: Diagnosis not present

## 2017-02-08 DIAGNOSIS — Z87891 Personal history of nicotine dependence: Secondary | ICD-10-CM | POA: Insufficient documentation

## 2017-02-08 DIAGNOSIS — D4959 Neoplasm of unspecified behavior of other genitourinary organ: Secondary | ICD-10-CM | POA: Diagnosis not present

## 2017-02-08 DIAGNOSIS — N201 Calculus of ureter: Secondary | ICD-10-CM

## 2017-02-08 DIAGNOSIS — J439 Emphysema, unspecified: Secondary | ICD-10-CM | POA: Diagnosis not present

## 2017-02-08 DIAGNOSIS — Z85828 Personal history of other malignant neoplasm of skin: Secondary | ICD-10-CM | POA: Diagnosis not present

## 2017-02-08 DIAGNOSIS — Z87442 Personal history of urinary calculi: Secondary | ICD-10-CM | POA: Insufficient documentation

## 2017-02-08 DIAGNOSIS — J449 Chronic obstructive pulmonary disease, unspecified: Secondary | ICD-10-CM | POA: Diagnosis not present

## 2017-02-08 DIAGNOSIS — Z8554 Personal history of malignant neoplasm of ureter: Secondary | ICD-10-CM | POA: Diagnosis not present

## 2017-02-08 DIAGNOSIS — G4730 Sleep apnea, unspecified: Secondary | ICD-10-CM | POA: Diagnosis not present

## 2017-02-08 DIAGNOSIS — J441 Chronic obstructive pulmonary disease with (acute) exacerbation: Secondary | ICD-10-CM

## 2017-02-08 DIAGNOSIS — E785 Hyperlipidemia, unspecified: Secondary | ICD-10-CM | POA: Insufficient documentation

## 2017-02-08 HISTORY — PX: CYSTOSCOPY WITH RETROGRADE PYELOGRAM, URETEROSCOPY AND STENT PLACEMENT: SHX5789

## 2017-02-08 SURGERY — CYSTOURETEROSCOPY, WITH RETROGRADE PYELOGRAM AND STENT INSERTION
Anesthesia: General | Laterality: Left

## 2017-02-08 MED ORDER — OXYCODONE-ACETAMINOPHEN 5-325 MG PO TABS: 1.0000 | ORAL_TABLET | ORAL | 0 refills | Status: DC | PRN

## 2017-02-08 MED ORDER — CEFAZOLIN SODIUM-DEXTROSE 2-4 GM/100ML-% IV SOLN
2.0000 g | INTRAVENOUS | Status: AC
  Administered 2017-02-08: 2 g via INTRAVENOUS
  Filled 2017-02-08: qty 100

## 2017-02-08 MED ORDER — OXYCODONE HCL 5 MG/5ML PO SOLN: 5.0000 mg | Freq: Once | ORAL | Status: DC | PRN

## 2017-02-08 MED ORDER — OXYCODONE HCL 5 MG PO TABS: 5.0000 mg | ORAL_TABLET | Freq: Once | ORAL | Status: DC | PRN

## 2017-02-08 MED ORDER — LACTATED RINGERS IV SOLN
INTRAVENOUS | Status: DC
  Administered 2017-02-08 (×2): via INTRAVENOUS

## 2017-02-08 MED ORDER — PROMETHAZINE HCL 25 MG/ML IJ SOLN: 6.2500 mg | INTRAMUSCULAR | Status: DC | PRN

## 2017-02-08 MED ORDER — FENTANYL CITRATE (PF) 100 MCG/2ML IJ SOLN
INTRAMUSCULAR | Status: AC
  Filled 2017-02-08: qty 2

## 2017-02-08 MED ORDER — PROPOFOL 10 MG/ML IV BOLUS
INTRAVENOUS | Status: AC
  Filled 2017-02-08: qty 20

## 2017-02-08 MED ORDER — SODIUM CHLORIDE 0.9 % IR SOLN
Status: DC | PRN
  Administered 2017-02-08: 1000 mL via INTRAVESICAL

## 2017-02-08 MED ORDER — HYDROMORPHONE HCL 1 MG/ML IJ SOLN: 0.2500 mg | INTRAMUSCULAR | Status: DC | PRN

## 2017-02-08 MED ORDER — FENTANYL CITRATE (PF) 100 MCG/2ML IJ SOLN
INTRAMUSCULAR | Status: DC | PRN
  Administered 2017-02-08: 25 ug via INTRAVENOUS

## 2017-02-08 MED ORDER — ONDANSETRON HCL 4 MG/2ML IJ SOLN
INTRAMUSCULAR | Status: DC | PRN
  Administered 2017-02-08: 4 mg via INTRAVENOUS

## 2017-02-08 MED ORDER — SODIUM CHLORIDE 0.9 % IR SOLN
Status: DC | PRN
  Administered 2017-02-08: 3000 mL via INTRAVESICAL

## 2017-02-08 MED ORDER — EPHEDRINE SULFATE 50 MG/ML IJ SOLN
INTRAMUSCULAR | Status: DC | PRN
  Administered 2017-02-08: 5 mg via INTRAVENOUS

## 2017-02-08 MED ORDER — SODIUM CHLORIDE 0.9 % IV SOLN
INTRAVENOUS | Status: DC | PRN
  Administered 2017-02-08: 8 mL

## 2017-02-08 SURGICAL SUPPLY — 21 items
BAG URO CATCHER STRL LF (MISCELLANEOUS) ×3 IMPLANT
CATH INTERMIT  6FR 70CM (CATHETERS) ×3 IMPLANT
CLOTH BEACON ORANGE TIMEOUT ST (SAFETY) ×3 IMPLANT
COVER FOOTSWITCH UNIV (MISCELLANEOUS) IMPLANT
COVER SURGICAL LIGHT HANDLE (MISCELLANEOUS) ×1 IMPLANT
EXTRACTOR STONE NITINOL NGAGE (UROLOGICAL SUPPLIES) IMPLANT
FIBER LASER FLEXIVA 1000 (UROLOGICAL SUPPLIES) IMPLANT
FIBER LASER FLEXIVA 365 (UROLOGICAL SUPPLIES) IMPLANT
FIBER LASER FLEXIVA 550 (UROLOGICAL SUPPLIES) IMPLANT
FIBER LASER TRAC TIP (UROLOGICAL SUPPLIES) IMPLANT
GLOVE BIO SURGEON STRL SZ8 (GLOVE) ×3 IMPLANT
GOWN STRL REUS W/TWL XL LVL3 (GOWN DISPOSABLE) ×3 IMPLANT
GUIDEWIRE ANG ZIPWIRE 038X150 (WIRE) ×3 IMPLANT
GUIDEWIRE STR DUAL SENSOR (WIRE) ×2 IMPLANT
MANIFOLD NEPTUNE II (INSTRUMENTS) ×3 IMPLANT
PACK CYSTO (CUSTOM PROCEDURE TRAY) ×3 IMPLANT
SHEATH URETERAL 12FRX35CM (MISCELLANEOUS) IMPLANT
STENT CONTOUR 6FRX26X.038 (STENTS) IMPLANT
TUBE FEEDING 8FR 16IN STR KANG (MISCELLANEOUS) IMPLANT
TUBING CONNECTING 10 (TUBING) ×2 IMPLANT
TUBING CONNECTING 10' (TUBING) ×1

## 2017-02-08 NOTE — Transfer of Care (Signed)
Immediate Anesthesia Transfer of Care Note  Patient: Douglas Monroe  Procedure(s) Performed: Procedure(s): CYSTOSCOPY WITH RETROGRADE PYELOGRAM, URETEROSCOPY (Left)  Patient Location: PACU  Anesthesia Type:General  Level of Consciousness:  sedated, patient cooperative and responds to stimulation  Airway & Oxygen Therapy:Patient Spontanous Breathing and Patient connected to face mask oxgen  Post-op Assessment:  Report given to PACU RN and Post -op Vital signs reviewed and stable  Post vital signs:  Reviewed and stable  Last Vitals:  Vitals:   02/08/17 1047  BP: 139/74  Resp: 18  Temp: 36.6 C  SpO2: 51%    Complications: No apparent anesthesia complications

## 2017-02-08 NOTE — Op Note (Signed)
Preoperative diagnosis: history of left ureteral TCC  Postoperative diagnosis: Same  Procedure: 1 cystoscopy 2.  left retrograde pyelography 3.  Intraoperative fluoroscopy, under one hour, with interpretation 4.  Left diagnostic ureteroscopy  Attending: Rosie Fate  Anesthesia: General  Estimated blood loss: None  Drains: none  Specimens: none  Antibiotics: ancef  Findings: no hydronephrosis. No tumor or stone visualized in the ureter.  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  After discussing treatment options, she decided proceed with left diagnostic ureteroscopy  Procedure her in detail: The patient was brought to the operating room and a brief timeout was done to ensure correct patient, correct procedure, correct site.  General anesthesia was administered patient was placed in dorsal lithotomy position.  Her genitalia was then prepped and draped in usual sterile fashion.  A rigid 30 French cystoscope was passed in the urethra and the bladder.  Bladder was inspected free masses or lesions.  the right ureteral orifices were in the normal orthotopic locations.  a 6 french ureteral catheter was then instilled into the left ureter orifice.  a gentle retrograde was obtained and findings noted above.  we then placed a zip wire through the ureteral catheter and advanced up to the renal pelvis.  we then removed the cystoscope and cannulated the left ureteral orifice with a semirigid ureteroscope.  we then performed ureteroscopy up to the level of the UPJ. No stone or tumor was encountered.  the bladder was then drained and this concluded the procedure which was well tolerated by patient.  Complications: None  Condition: Stable, extubated, transferred to PACU  Plan: Pt is to followup in 2 weeks

## 2017-02-08 NOTE — H&P (Signed)
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 No denies hematuria or dysuria. He is here for surveillance ureteroscopy  Past Medical History:  Diagnosis Date  . Abdominal aortic aneurysm ([place])    infrarenal -- monitored by dr berry (note states stable 4.0 to 4.1cm)  . Anxiety    situational  . Arthritis    "hands" (03/15/2016)  . Cancer of skin of temple 2017   right  . COPD with emphysema ([place])   . Dyspnea    with exertion  . Exertional dyspnea   . First degree heart block   . GERD (gastroesophageal reflux disease)   . Headache(784.0)    uses goody powder; "maybe weekly, maybe not that much" (03/15/2016)  . History of kidney stones   . HOH (hard of hearing)    left ear; no hearing aid   . HTN (hypertension) 08/20/2011  . Hyperlipidemia   . Hypertension   . Ischemic colitis ([place])   . Left carotid artery stenosis    moderate  . Migraine    "long time ago" (03/15/2016)  . 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 PVD (peripheral vascular disease) with claudication [name]) cardiologist-  dr berry   s/p bilateral iliac stents and right sfa stenting/  doppper study 09-05-2012  revealed ABIs close to one bilaterally with patent stents/  stenting left renal artery stenosis   . Right ureteral stone   . S/P arterial stent    left renal angioplasty and stent 11-02-2012  . Sleep apnea    at risk for OSA. stop/bang score= 6; sent to PCP 10/04/13; denies use of mask on 03/15/2016  . Tubular adenoma of colon    Past Surgical History:  Procedure Laterality Date  . ABDOMINAL ANGIOGRAM  08/19/2011   Left common iliac artery at the ostium, 6x72mm ICAST covered stent, common femoral artery, 8x143mm Abbott absolute pro nitinol self-expanding stent and resulting in reduction of 80 and 90% stenosis to 0% residual  . ABDOMINAL ANGIOGRAM  10/08/2010   Mid-right SFA, 6x120 EV3 Protege nitinol self-expanding stent, resulting in reduction of CTO to 0% residual  . ABDOMINAL ANGIOGRAM  06/22/2010   Rt Common Femoral Artery-8x3 Absolute Pro Nitinol self-expanding stent-90% stenosis to 0% residual; Rt External Iliac Artery-9x3 Absolute Pro Nitinol self-expanding stent-70% stenosis to 0% residual; Left External Iliac Artery-8x4 Absolute Pro Nitinol self-expanding stent-90% to 0% residual  . ATHERECTOMY N/A 08/19/2011   Procedure: ATHERECTOMY;  Surgeon: Lorretta Harp, MD;  Location: [facility] CATH LAB;  Service: Cardiovascular;  Laterality: N/A;  . BACK SURGERY    . CARDIAC CATHETERIZATION  10/26/2002  dr Tami Ribas   normal coronary arteries/ normal  LVF  . CARDIOVASCULAR STRESS TEST  06-12-2010   dr berry   normal perfusion study/ no ischemia/  ef 69%  . COLONOSCOPY WITH PROPOFOL N/A 03/19/2016   Procedure: COLONOSCOPY WITH PROPOFOL;  Surgeon: Mauri Pole, MD;  Location: [facility];  Service: Endoscopy;  Laterality: N/A;  was inpatient, but got d/c'd home and is coming back as an OP  . CYSTOSCOPY W/ URETERAL STENT PLACEMENT Left 06/07/2016   Procedure: CYSTOSCOPY WITH RETROGRADE PYELOGRAM/URETERAL STENT REPLACEMENT;  Surgeon: Cleon Gustin, MD;  Location: [facility];  Service: Urology;  Laterality: Left;  . CYSTOSCOPY W/ URETERAL STENT PLACEMENT Left 09/20/2016   Procedure: CYSTOSCOPY WITH RETROGRADE PYELOGRAM/URETERAL STENT PLACEMENT;  Surgeon: Cleon Gustin, MD;  Location: [facility] ORS;  Service: Urology;  Laterality: Left;  . CYSTOSCOPY WITH RETROGRADE PYELOGRAM, URETEROSCOPY AND STENT PLACEMENT Right 10/10/2013   Procedure: uretheral dilation, CYSTOSCOPY, URETEROSCOPY, stone extraction, STENT PLACEMENT;  Surgeon: [name], MD;  Location: [facility];  Service: Urology;  Laterality: Right;  . CYSTOSCOPY WITH URETEROSCOPY Left 05/09/2016   Procedure: CYSTOSCOPY, RETROGRADE, LEFT URETEROSCOPY, LEFT URETERAL BIOPSIES, HOLMIUM LASER  FULGERATION, LEFT URETERAL STENT PLACEMENT;  Surgeon: Irine Seal, MD;  Location: [facility];  Service: Urology;  Laterality: Left;  . CYSTOSCOPY/URETEROSCOPY/HOLMIUM LASER Left 09/20/2016   Procedure: CYSTOSCOPY/URETEROSCOPY/HOLMIUM LASER;  Surgeon: Cleon Gustin, MD;  Location: [facility];  Service: Urology;  Laterality: Left;  . HOLMIUM LASER APPLICATION Right 0/02/930   Procedure: HOLMIUM LASER APPLICATION;  Surgeon: [name], MD;  Location: [facility];  Service: Urology;  Laterality: Right;  . HOLMIUM LASER APPLICATION Left 04/06/5730   Procedure: TUMOR ABLATION WITH HOLMIUM LASER;  Surgeon: Cleon Gustin, MD;  Location: [facility];  Service: Urology;  Laterality: Left;  . LUMBAR DISC SURGERY  1970's  . RENAL ANGIOGRAM N/A 11/02/2012   Procedure: RENAL ANGIOGRAM;  Surgeon: Lorretta Harp, MD;  Location: [facility] CATH LAB;  Service: Cardiovascular;  Laterality: N/A;  . RENAL ARTERY STENT Left 11/02/2012  . SKIN CANCER EXCISION Right 2017   temple  . SOFT TISSUE CYST EXCISION  1960's   "outside part of lung LLL"    Home Medications:  Current Facility-Administered Medications  Medication Dose Route Frequency Provider Last Rate Last Dose  . ceFAZolin (ANCEF) IVPB 2g/100 mL premix  2 g Intravenous 30 min Pre-Op Emary Zalar, Candee Furbish, MD      . lactated ringers infusion   Intravenous Continuous Lynda Rainwater, MD 20 mL/hr at 02/08/17 1056     Allergies:  Allergies  Allergen Reactions  . Pneumococcal Vaccines Swelling and Other (See Comments)    Lip swelling    Family History  Problem Relation Age of Onset  . Hypertension Mother   . Cancer Father   . Diabetes Brother   . Hypertension Son    Social History:  reports that he quit smoking about 6 years ago. His smoking use included cigarettes. He has a 60.00 pack-year smoking history. he has never used smokeless tobacco. He reports that he does not drink alcohol or use drugs.  Review of Systems  All other systems  reviewed and are negative.   Physical Exam:  Vital signs in last 24 hours: Temp:  [97.9 F (36.6 C)-98.2 F (36.8 C)] 97.9 F (36.6 C) (01/08 1047) Pulse Rate:  [67] 67 (01/07 1312) Resp:  [18] 18 (01/08 1047) BP: (135-139)/(66-74) 139/74 (01/08 1047) SpO2:  [95 %-96 %] 96 % (01/08 1047) Weight:  [64.9 kg (143 lb)-65 kg (143 lb 4 oz)] 64.9 kg (143 lb) (01/08 1047) Physical Exam  Constitutional: He is oriented to person, place, and time. He appears well-developed and well-nourished.  HENT:  Head: Normocephalic and atraumatic.  Eyes: EOM are normal. Pupils are equal, round, and reactive to light.  Neck: Normal range of motion. No thyromegaly present.  Cardiovascular: Normal rate and regular rhythm.  Respiratory: Effort normal. No respiratory distress.  GI: Soft. He exhibits no distension.  Musculoskeletal: Normal range of motion. He exhibits no edema.  Neurological: He is alert and oriented to person, place, and time.  Skin: Skin is warm and dry.  Psychiatric: He has a normal mood and affect. His behavior  is normal. Judgment and thought content normal.    Laboratory Data:  Results for orders placed or performed during the hospital encounter of 02/07/17 (from the past 24 hour(s))  Basic metabolic panel     Status: Abnormal   Collection Time: 02/07/17  1:50 PM  Result Value Ref Range   Sodium 142 135 - 145 mmol/L   Potassium 4.4 3.5 - 5.1 mmol/L   Chloride 111 101 - 111 mmol/L   CO2 27 22 - 32 mmol/L   Glucose, Bld 103 (H) 65 - 99 mg/dL   BUN 21 (H) 6 - 20 mg/dL   Creatinine, Ser 1.26 (H) 0.61 - 1.24 mg/dL   Calcium 9.2 8.9 - 10.3 mg/dL   GFR calc non Af Amer 51 (L) >60 mL/min   GFR calc Af Amer 60 (L) >60 mL/min   Anion gap 4 (L) 5 - 15  CBC     Status: None   Collection Time: 02/07/17  1:50 PM  Result Value Ref Range   WBC 7.5 4.0 - 10.5 K/uL   RBC 4.56 4.22 - 5.81 MIL/uL   Hemoglobin 14.1 13.0 - 17.0 g/dL   HCT 43.4 39.0 - 52.0 %   MCV 95.2 78.0 - 100.0 fL   MCH  30.9 26.0 - 34.0 pg   MCHC 32.5 30.0 - 36.0 g/dL   RDW 15.1 11.5 - 15.5 %   Platelets 185 150 - 400 K/uL   No results found for this or any previous visit (from the past 240 hour(s)). Creatinine: Recent Labs    02/07/17 1350  CREATININE 1.26*   Baseline Creatinine: 1.2  Impression/Assessment:  84yo with hx of left ureteral TCC  Plan:  The risks/benefits/altenatives to left diagnostic ureteroscopy with possible tumor ablation was explained to the patient and he understands and wishes to proceed with surgery  Nicolette Bang 02/08/2017, 12:26 PM

## 2017-02-08 NOTE — Anesthesia Postprocedure Evaluation (Signed)
Anesthesia Post Note  Patient: Douglas Monroe  Procedure(s) Performed: CYSTOSCOPY WITH RETROGRADE PYELOGRAM, URETEROSCOPY (Left )     Patient location during evaluation: PACU Anesthesia Type: General Level of consciousness: awake and alert Pain management: pain level controlled Vital Signs Assessment: post-procedure vital signs reviewed and stable Respiratory status: spontaneous breathing, nonlabored ventilation and respiratory function stable Cardiovascular status: blood pressure returned to baseline and stable Postop Assessment: no apparent nausea or vomiting Anesthetic complications: no    Last Vitals:  Vitals:   02/08/17 1355 02/08/17 1418  BP: 134/71 (!) 162/75  Pulse: 69 66  Resp: 16 16  Temp: 36.6 C (!) 36.3 C  SpO2: 95% 95%    Last Pain:  Vitals:   02/08/17 1418  TempSrc: Oral  PainSc: 0-No pain                 Lynda Rainwater

## 2017-02-08 NOTE — Discharge Instructions (Signed)
General Anesthesia, Adult, Care After These instructions provide you with information about caring for yourself after your procedure. Your health care provider may also give you more specific instructions. Your treatment has been planned according to current medical practices, but problems sometimes occur. Call your health care provider if you have any problems or questions after your procedure. What can I expect after the procedure? After the procedure, it is common to have:  Vomiting.  A sore throat.  Mental slowness.  It is common to feel:  Nauseous.  Cold or shivery.  Sleepy.  Tired.  Sore or achy, even in parts of your body where you did not have surgery.  Follow these instructions at home: For at least 24 hours after the procedure:  Do not: ? Participate in activities where you could fall or become injured. ? Drive. ? Use heavy machinery. ? Drink alcohol. ? Take sleeping pills or medicines that cause drowsiness. ? Make important decisions or sign legal documents. ? Take care of children on your own.  Rest. Eating and drinking  If you vomit, drink water, juice, or soup when you can drink without vomiting.  Drink enough fluid to keep your urine clear or pale yellow.  Make sure you have little or no nausea before eating solid foods.  Follow the diet recommended by your health care provider. General instructions  Have a responsible adult stay with you until you are awake and alert.  Return to your normal activities as told by your health care provider. Ask your health care provider what activities are safe for you.  Take over-the-counter and prescription medicines only as told by your health care provider.  If you smoke, do not smoke without supervision.  Keep all follow-up visits as told by your health care provider. This is important. Contact a health care provider if:  You continue to have nausea or vomiting at home, and medicines are not helpful.  You  cannot drink fluids or start eating again.  You cannot urinate after 8-12 hours.  You develop a skin rash.  You have fever.  You have increasing redness at the site of your procedure. Get help right away if:  You have difficulty breathing.  You have chest pain.  You have unexpected bleeding.  You feel that you are having a life-threatening or urgent problem. This information is not intended to replace advice given to you by your health care provider. Make sure you discuss any questions you have with your health care provider. Document Released: 04/26/2000 Document Revised: 06/23/2015 Document Reviewed: 01/02/2015 Elsevier Interactive Patient Education  2018 [employer]. Ureteroscopy, Care After This sheet gives you information about how to care for yourself after your procedure. Your health care provider may also give you more specific instructions. If you have problems or questions, contact your health care provider. What can I expect after the procedure? After the procedure, it is common to have:  A burning sensation when you urinate.  Blood in your urine.  Mild discomfort in the bladder area or kidney area when urinating.  Needing to urinate more often or urgently.  Follow these instructions at home: Medicines  Take over-the-counter and prescription medicines only as told by your health care provider.  If you were prescribed an antibiotic medicine, take it as told by your health care provider. Do not stop taking the antibiotic even if you start to feel better. General instructions   Donot drive for 24 hours if you were given a medicine to help  you relax (sedative) during your procedure.  To relieve burning, try taking a warm bath or holding a warm washcloth over your groin.  Drink enough fluid to keep your urine clear or pale yellow. ? Drink two 8-ounce glasses of water every hour for the first 2 hours after you get home. ? Continue to drink water often at  home.  You can eat what you usually do.  Keep all follow-up visits as told by your health care provider. This is important. ? If you had a tube placed to keep urine flowing (ureteral stent), ask your health care provider when you need to return to have it removed. Contact a health care provider if:  You have chills or a fever.  You have burning pain for longer than 24 hours after the procedure.  You have blood in your urine for longer than 24 hours after the procedure. Get help right away if:  You have large amounts of blood in your urine.  You have blood clots in your urine.  You have very bad pain.  You have chest pain or trouble breathing.  You are unable to urinate and you have the feeling of a full bladder. This information is not intended to replace advice given to you by your health care provider. Make sure you discuss any questions you have with your health care provider. Document Released: 01/23/2013 Document Revised: 11/04/2015 Document Reviewed: 10/31/2015 Elsevier Interactive Patient Education  2018 [employer]. Cystoscopy, Care After Refer to this sheet in the next few weeks. These instructions provide you with information about caring for yourself after your procedure. Your health care provider may also give you more specific instructions. Your treatment has been planned according to current medical practices, but problems sometimes occur. Call your health care provider if you have any problems or questions after your procedure. What can I expect after the procedure? After the procedure, it is common to have:  Mild pain when you urinate. Pain should stop within a few minutes after you urinate. This may last for up to 1 week.  A small amount of blood in your urine for several days.  Feeling like you need to urinate but producing only a small amount of urine.  Follow these instructions at home:  Medicines  Take over-the-counter and prescription medicines only  as told by your health care provider.  If you were prescribed an antibiotic medicine, take it as told by your health care provider. Do not stop taking the antibiotic even if you start to feel better. General instructions   Return to your normal activities as told by your health care provider. Ask your health care provider what activities are safe for you.  Do not drive for 24 hours if you received a sedative.  Watch for any blood in your urine. If the amount of blood in your urine increases, call your health care provider.  Follow instructions from your health care provider about eating or drinking restrictions.  If a tissue sample was removed for testing (biopsy) during your procedure, it is your responsibility to get your test results. Ask your health care provider or the department performing the test when your results will be ready.  Drink enough fluid to keep your urine clear or pale yellow.  Keep all follow-up visits as told by your health care provider. This is important. Contact a health care provider if:  You have pain that gets worse or does not get better with medicine, especially pain when you urinate.  You have difficulty urinating. Get help right away if:  You have more blood in your urine.  You have blood clots in your urine.  You have abdominal pain.  You have a fever or chills.  You are unable to urinate. This information is not intended to replace advice given to you by your health care provider. Make sure you discuss any questions you have with your health care provider. Document Released: 08/07/2004 Document Revised: 06/26/2015 Document Reviewed: 12/05/2014 Elsevier Interactive Patient Education  [employer].

## 2017-02-08 NOTE — Telephone Encounter (Signed)
Notes recorded by Marshell Garfinkel, MD on 02/08/2017 at 10:45 AM EST PET scan shows that the lung nodule has low activity so is not likely to be a malignancy. Please order sputum for AFB, fungus and regular cultures to evaluate for infection and inform pt. I will discuss further at time of clinic visit this month. ------------ Spoke with pt, states that he has already discussed results with an unnamed nurse from our office.  Pt explained in detail the recs.  Verified ROV.  Nothing further needed.

## 2017-02-08 NOTE — Anesthesia Procedure Notes (Signed)
Procedure Name: LMA Insertion Date/Time: 02/08/2017 12:44 PM Performed by: Anne Fu, CRNA Pre-anesthesia Checklist: Patient identified, Emergency Drugs available, Suction available, Patient being monitored and Timeout performed Patient Re-evaluated:Patient Re-evaluated prior to induction Oxygen Delivery Method: Circle system utilized Preoxygenation: Pre-oxygenation with 100% oxygen Induction Type: IV induction Ventilation: Mask ventilation without difficulty LMA: LMA inserted LMA Size: 4.0 Number of attempts: 1 Placement Confirmation: positive ETCO2 and breath sounds checked- equal and bilateral Tube secured with: Tape

## 2017-02-08 NOTE — Anesthesia Preprocedure Evaluation (Signed)
Anesthesia Evaluation  Patient identified by MRN, date of birth, ID band Patient awake    Reviewed: Allergy & Precautions, NPO status , Patient's Chart, lab work & pertinent test results, reviewed documented beta blocker date and time   Airway Mallampati: I       Dental  (+) Edentulous Upper, Edentulous Lower   Pulmonary sleep apnea , COPD,  COPD inhaler, former smoker,    Pulmonary exam normal breath sounds clear to auscultation       Cardiovascular hypertension, Pt. on medications and Pt. on home beta blockers + Peripheral Vascular Disease (s/p B/L iliac stents)  Normal cardiovascular exam Rhythm:Regular Rate:Normal     Neuro/Psych  Headaches, PSYCHIATRIC DISORDERS Anxiety    GI/Hepatic Neg liver ROS, GERD  Medicated,  Endo/Other  negative endocrine ROS  Renal/GU Renal hypertensionRenal disease     Musculoskeletal  (+) Arthritis , Osteoarthritis,    Abdominal   Peds  Hematology  (+) Blood dyscrasia (Plavix), ,   Anesthesia Other Findings Day of surgery medications reviewed with the patient.  Reproductive/Obstetrics                             Anesthesia Physical  Anesthesia Plan  ASA: III  Anesthesia Plan: General   Post-op Pain Management:    Induction: Intravenous  PONV Risk Score and Plan: 2 and Ondansetron and Midazolam  Airway Management Planned: LMA  Additional Equipment:   Intra-op Plan:   Post-operative Plan: Extubation in OR  Informed Consent: I have reviewed the patients History and Physical, chart, labs and discussed the procedure including the risks, benefits and alternatives for the proposed anesthesia with the patient or authorized representative who has indicated his/her understanding and acceptance.   Dental advisory given  Plan Discussed with: CRNA  Anesthesia Plan Comments: (Risks/benefits of general anesthesia discussed with patient including risk of  damage to teeth, lips, gum, and tongue, nausea/vomiting, allergic reactions to medications, and the possibility of heart attack, stroke and death.  All patient questions answered.  Patient wishes to proceed.)        Anesthesia Quick Evaluation

## 2017-02-09 ENCOUNTER — Encounter (HOSPITAL_COMMUNITY): Payer: Self-pay | Admitting: Urology

## 2017-02-16 ENCOUNTER — Telehealth: Payer: Self-pay | Admitting: Pulmonary Disease

## 2017-02-16 NOTE — Telephone Encounter (Signed)
Spoke with the pt's spouse  She forgot about getting the sputum cup and wants to know if this can wait until his ov next wk  I advised this is fine  He will keep appts for pft and ov  Nothing further needed

## 2017-02-21 ENCOUNTER — Encounter: Payer: Self-pay | Admitting: Allergy and Immunology

## 2017-02-22 ENCOUNTER — Encounter: Payer: Self-pay | Admitting: Pulmonary Disease

## 2017-02-22 ENCOUNTER — Ambulatory Visit: Payer: Medicare Other | Admitting: Pulmonary Disease

## 2017-02-22 ENCOUNTER — Ambulatory Visit (INDEPENDENT_AMBULATORY_CARE_PROVIDER_SITE_OTHER): Payer: Medicare Other | Admitting: Pulmonary Disease

## 2017-02-22 VITALS — BP 122/64 | HR 78 | Ht 66.0 in | Wt 148.0 lb

## 2017-02-22 DIAGNOSIS — R0602 Shortness of breath: Secondary | ICD-10-CM

## 2017-02-22 DIAGNOSIS — J439 Emphysema, unspecified: Secondary | ICD-10-CM | POA: Diagnosis not present

## 2017-02-22 DIAGNOSIS — R911 Solitary pulmonary nodule: Secondary | ICD-10-CM | POA: Diagnosis not present

## 2017-02-22 LAB — PULMONARY FUNCTION TEST
DL/VA % PRED: 73 %
DL/VA: 3.17 ml/min/mmHg/L
DLCO UNC: 12.85 ml/min/mmHg
DLCO cor % pred: 49 %
DLCO cor: 13.37 ml/min/mmHg
DLCO unc % pred: 47 %
FEF 25-75 PRE: 0.29 L/s
FEF 25-75 Post: 0.42 L/sec
FEF2575-%CHANGE-POST: 44 %
FEF2575-%PRED-POST: 27 %
FEF2575-%Pred-Pre: 19 %
FEV1-%CHANGE-POST: 21 %
FEV1-%PRED-PRE: 33 %
FEV1-%Pred-Post: 40 %
FEV1-PRE: 0.76 L
FEV1-Post: 0.93 L
FEV1FVC-%Change-Post: 5 %
FEV1FVC-%Pred-Pre: 65 %
FEV6-%CHANGE-POST: 15 %
FEV6-%PRED-POST: 60 %
FEV6-%PRED-PRE: 52 %
FEV6-Post: 1.84 L
FEV6-Pre: 1.6 L
FEV6FVC-%CHANGE-POST: 0 %
FEV6FVC-%PRED-PRE: 105 %
FEV6FVC-%Pred-Post: 106 %
FVC-%CHANGE-POST: 15 %
FVC-%Pred-Post: 57 %
FVC-%Pred-Pre: 50 %
FVC-Post: 1.89 L
FVC-Pre: 1.65 L
POST FEV1/FVC RATIO: 49 %
POST FEV6/FVC RATIO: 98 %
PRE FEV6/FVC RATIO: 97 %
Pre FEV1/FVC ratio: 46 %
RV % pred: 173 %
RV: 4.32 L
TLC % pred: 105 %
TLC: 6.61 L

## 2017-02-22 MED ORDER — FLUTICASONE-UMECLIDIN-VILANT 100-62.5-25 MCG/INH IN AEPB: 1.0000 | INHALATION_SPRAY | Freq: Every day | RESPIRATORY_TRACT | 6 refills | Status: AC

## 2017-02-22 MED ORDER — FLUTTER DEVI: 0 refills | Status: DC

## 2017-02-22 MED ORDER — FLUTICASONE-UMECLIDIN-VILANT 100-62.5-25 MCG/INH IN AEPB: 1.0000 | INHALATION_SPRAY | Freq: Every day | RESPIRATORY_TRACT | 0 refills | Status: AC

## 2017-02-22 NOTE — Progress Notes (Addendum)
Douglas Monroe    [id number]    November 08, 1932  Primary Care Physician:Sanders, Bailey Mech, MD  Referring Physician: Glendale Chard, [place], [place] [address]  Chief complaint:  Follow-up for COPD, lung nodule  HPI: 84 year old with history of COPD, hypertension, abdominal aortic aneurysm GERD, sleep apnea, renal artery stenosis,  peripheral vascular disease Seen in the ED on 10/21 for dyspnea, possible allergic reaction to albuterol.  He was taking albuterol every 2 hours for COPD exacerbation and reports feeling of throat closing up and rash on hands and genital area.  He took Benadryl and Pepcid at home with improvement He had been treated in the ED with with prednisone and ipratropium inhaler for his breathing.  He has been taken of albuterol and given Atrovent instead.  He is also on Symbicort which is started a month ago.  He reports good response to that He was on Spiriva but was taken off by his primary care at the time Symbicort was initiated.  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  He has a planned cystoscopy with pyelogram, ureteroscopy with tumor ablation and stent placement in December 2018 and has been sent here for surgical clearance.  Occupation: Retired Games developer Exposures: No known exposures Smoking history: 90-pack-year smoking history.  Quit in 2012 Travel History: Not significant  Interim history: He underwent urologic procedure for carcinoma of the ureter in December 2018.  He tolerated this well without any issue.  He had a CT scan which showed left lower lobe lung nodule.  Follow-up PET scan did not show any uptake or suspicious activity.  Outpatient Encounter Medications as of 02/22/2017  Medication Sig  . albuterol (PROVENTIL HFA;VENTOLIN HFA) 108 (90 Base) MCG/ACT inhaler Inhale 2 puffs into the lungs every 6 (six) hours  as needed for wheezing or shortness of breath.  [name] albuterol (PROVENTIL) (2.5 MG/3ML) 0.083% nebulizer solution Take 2.5 mg by nebulization every 6 (six) hours as needed for wheezing or shortness of breath.  [name] amLODipine (NORVASC) 5 MG tablet Take 5 mg by mouth daily.   . Aspirin-Acetaminophen-Caffeine (GOODY HEADACHE PO) Take 1 packet by mouth daily as needed (for headache).  [name] atorvastatin (LIPITOR) 20 MG tablet TAKE 1 TABLET BY MOUTH  DAILY AT 6 PM. (Patient taking differently: TAKE 20 MG BY MOUTH  DAILY AT 6 PM.)  . budesonide-formoterol (SYMBICORT) 160-4.5 MCG/ACT inhaler Inhale 2 puffs into the lungs 2 (two) times daily.  [name] BYSTOLIC 10 MG tablet Take 10 mg by mouth daily.  . clopidogrel (PLAVIX) 75 MG tablet TAKE 1 TABLET BY MOUTH  EVERY DAY (Patient taking differently: TAKE 75 MG BY MOUTH  EVERY DAY)  . [employer] D3 2000 units CAPS Take 2,000 Units by mouth daily.   [name] dextromethorphan (DELSYM) 30 MG/5ML liquid Take 30 mg by mouth daily as needed for cough.   . finasteride (PROSCAR) 5 MG tablet Take 5 mg by mouth daily.   [name] ipratropium (ATROVENT) 0.02 % nebulizer solution Take 2.5 mLs (0.5 mg total) by nebulization every 4 (four) hours as needed for wheezing or shortness of breath.  . Magnesium 500 MG CAPS Take 500 mg by mouth daily.  [name] oxyCODONE-acetaminophen (PERCOCET/ROXICET) 5-325 MG tablet Take 1 tablet by mouth every 4 (four) hours as needed for moderate pain.  . pantoprazole (PROTONIX) 40 MG tablet Take 40 mg by mouth daily.  [name]  tamsulosin (FLOMAX) 0.4 MG CAPS capsule Take 0.4 mg by mouth daily.   [name] tiotropium (SPIRIVA) 18 MCG inhalation capsule Place 18 mcg into inhaler and inhale daily.   No facility-administered encounter medications on file as of 02/22/2017.     Allergies as of 02/22/2017 - Review Complete 02/22/2017  Allergen Reaction Noted  . Pneumococcal vaccines Swelling and Other (See Comments) 10/04/2013    Past Medical History:  Diagnosis Date  . Abdominal aortic aneurysm  ([place])    infrarenal -- monitored by dr berry (note states stable 4.0 to 4.1cm)  . Anxiety    situational  . Arthritis    "hands" (03/15/2016)  . Cancer of skin of temple 2017   right  . COPD with emphysema ([place])   . Dyspnea    with exertion  . Exertional dyspnea   . First degree heart block   . GERD (gastroesophageal reflux disease)   . Headache(784.0)    uses goody powder; "maybe weekly, maybe not that much" (03/15/2016)  . History of kidney stones   . HOH (hard of hearing)    left ear; no hearing aid   . HTN (hypertension) 08/20/2011  . Hyperlipidemia   . Hypertension   . Ischemic colitis ([place])   . Left carotid artery stenosis    moderate  . Migraine    "long time ago" (03/15/2016)  . 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 PVD (peripheral vascular disease) with claudication [facility]) cardiologist-  dr berry   s/p bilateral iliac stents and right sfa stenting/  doppper study 09-05-2012  revealed ABIs close to one bilaterally with patent stents/  stenting left renal artery stenosis   . Right ureteral stone   . S/P arterial stent    left renal angioplasty and stent 11-02-2012  . Sleep apnea    at risk for OSA. stop/bang score= 6; sent to PCP 10/04/13; denies use of mask on 03/15/2016  . Tubular adenoma of colon     Past Surgical History:  Procedure Laterality Date  . ABDOMINAL ANGIOGRAM  08/19/2011   Left common iliac artery at the ostium, 6x70mm ICAST covered stent, common femoral artery, 8x132mm Abbott absolute pro nitinol self-expanding stent and resulting in reduction of 80 and 90% stenosis to 0% residual  . ABDOMINAL ANGIOGRAM  10/08/2010   Mid-right SFA, 6x120 EV3 Protege nitinol self-expanding stent, resulting in reduction of CTO to 0% residual  . ABDOMINAL ANGIOGRAM  06/22/2010   Rt Common Femoral Artery-8x3 Absolute Pro Nitinol self-expanding stent-90% stenosis to 0% residual; Rt External Iliac Artery-9x3 Absolute Pro Nitinol self-expanding stent-70%  stenosis to 0% residual; Left External Iliac Artery-8x4 Absolute Pro Nitinol self-expanding stent-90% to 0% residual  . ATHERECTOMY N/A 08/19/2011   Procedure: ATHERECTOMY;  Surgeon: Lorretta Harp, MD;  Location: [facility] CATH LAB;  Service: Cardiovascular;  Laterality: N/A;  . BACK SURGERY    . CARDIAC CATHETERIZATION  10/26/2002  dr Tami Ribas   normal coronary arteries/ normal  LVF  . CARDIOVASCULAR STRESS TEST  06-12-2010   dr berry   normal perfusion study/ no ischemia/  ef 69%  . COLONOSCOPY WITH PROPOFOL N/A 03/19/2016   Procedure: COLONOSCOPY WITH PROPOFOL;  Surgeon: Mauri Pole, MD;  Location: [facility];  Service: Endoscopy;  Laterality: N/A;  was inpatient, but got d/c'd home and is coming back as an OP  . CYSTOSCOPY W/ URETERAL STENT PLACEMENT Left 06/07/2016   Procedure: CYSTOSCOPY WITH RETROGRADE PYELOGRAM/URETERAL STENT REPLACEMENT;  Surgeon: Cleon Gustin, MD;  Location: [facility];  Service: Urology;  Laterality: Left;  . CYSTOSCOPY W/ URETERAL STENT PLACEMENT Left 09/20/2016   Procedure: CYSTOSCOPY WITH RETROGRADE PYELOGRAM/URETERAL STENT PLACEMENT;  Surgeon: Cleon Gustin, MD;  Location: [facility];  Service: Urology;  Laterality: Left;  . CYSTOSCOPY WITH RETROGRADE PYELOGRAM, URETEROSCOPY AND STENT PLACEMENT Right 10/10/2013   Procedure: uretheral dilation, CYSTOSCOPY, URETEROSCOPY, stone extraction, STENT PLACEMENT;  Surgeon: [name], MD;  Location: [facility];  Service: Urology;  Laterality: Right;  . CYSTOSCOPY WITH RETROGRADE PYELOGRAM, URETEROSCOPY AND STENT PLACEMENT Left 02/08/2017   Procedure: CYSTOSCOPY WITH RETROGRADE PYELOGRAM, URETEROSCOPY;  Surgeon: Cleon Gustin, MD;  Location: [facility];  Service: Urology;  Laterality: Left;  . CYSTOSCOPY WITH URETEROSCOPY Left 05/09/2016   Procedure: CYSTOSCOPY, RETROGRADE, LEFT URETEROSCOPY, LEFT URETERAL BIOPSIES, HOLMIUM LASER FULGERATION, LEFT URETERAL STENT PLACEMENT;  Surgeon: Irine Seal, MD;   Location: [facility];  Service: Urology;  Laterality: Left;  . CYSTOSCOPY/URETEROSCOPY/HOLMIUM LASER Left 09/20/2016   Procedure: CYSTOSCOPY/URETEROSCOPY/HOLMIUM LASER;  Surgeon: Cleon Gustin, MD;  Location: [facility];  Service: Urology;  Laterality: Left;  . HOLMIUM LASER APPLICATION Right 05/05/345   Procedure: HOLMIUM LASER APPLICATION;  Surgeon: [name], MD;  Location: [facility];  Service: Urology;  Laterality: Right;  . HOLMIUM LASER APPLICATION Left 05/04/5954   Procedure: TUMOR ABLATION WITH HOLMIUM LASER;  Surgeon: Cleon Gustin, MD;  Location: [facility];  Service: Urology;  Laterality: Left;  . LUMBAR DISC SURGERY  1970's  . RENAL ANGIOGRAM N/A 11/02/2012   Procedure: RENAL ANGIOGRAM;  Surgeon: Lorretta Harp, MD;  Location: [facility] CATH LAB;  Service: Cardiovascular;  Laterality: N/A;  . RENAL ARTERY STENT Left 11/02/2012  . SKIN CANCER EXCISION Right 2017   temple  . SOFT TISSUE CYST EXCISION  1960's   "outside part of lung LLL"    Family History  Problem Relation Age of Onset  . Hypertension Mother   . Cancer Father   . Diabetes Brother   . Hypertension Son     Social History   Socioeconomic History  . Marital status: Married    Spouse name: Not on file  . Number of children: Not on file  . Years of education: Not on file  . Highest education level: Not on file  Social Needs  . Financial resource strain: Not on file  . Food insecurity - worry: Not on file  . Food insecurity - inability: Not on file  . Transportation needs - medical: Not on file  . Transportation needs - non-medical: Not on file  Occupational History  . Not on file  Tobacco Use  . Smoking status: Former Smoker    Packs/day: 1.00    Years: 60.00    Pack years: 60.00    Types: Cigarettes    Last attempt to quit: 06/02/2010    Years since quitting: 6.7  . Smokeless tobacco: Never Used  Substance and Sexual Activity  . Alcohol use: No  . Drug use: No  . Sexual activity: Not  Currently  Other Topics Concern  . Not on file  Social History Narrative  . Not on file    Review of systems: Review of Systems  Constitutional: Negative for fever and chills.  HENT: Negative.   Eyes: Negative for blurred vision.  Respiratory: as per HPI  Cardiovascular: Negative for chest pain and palpitations.  Gastrointestinal: Negative for vomiting, diarrhea, blood per rectum. Genitourinary: Negative for dysuria, urgency, frequency and hematuria.  Musculoskeletal: Negative for myalgias,  back pain and joint pain.  Skin: Negative for itching and rash.  Neurological: Negative for dizziness, tremors, focal weakness, seizures and loss of consciousness.  Endo/Heme/Allergies: Negative for environmental allergies.  Psychiatric/Behavioral: Negative for depression, suicidal ideas and hallucinations.  All other systems reviewed and are negative.  Physical Exam: Blood pressure 122/64, pulse 78, height 5\' 6"  (1.676 m), weight 148 lb (67.1 kg), SpO2 91 %. Gen:      No acute distress HEENT:  EOMI, sclera anicteric Neck:     No masses; no thyromegaly Lungs:    Clear to auscultation bilaterally; normal respiratory effort CV:         Regular rate and rhythm; no murmurs Abd:      + bowel sounds; soft, non-tender; no palpable masses, no distension Ext:    No edema; adequate peripheral perfusion Skin:      Warm and dry; no rash Neuro: alert and oriented x 3 Psych: normal mood and affect  Data Reviewed: CT abdomen pelvis 05/18/16-lung images show scarring in the left base, 6.8 mm nodule which is new compared to prior. Chest x-ray 11/21/16-hyperinflation, chronic bronchitis.  Blunting of the left costophrenic angle.  No acute infiltrate or consolidation CT chest 12/13/16-moderate emphysema, 1.5 cm left lower lobe nodule, mild bronchiectasis, tree-in-bud in lingula and left lower lobe. PET scan 02/02/17-peribronchial thickening, tree-in-bud nodularity in the left lower lobe.  Left lower lobe nodule  is not metabolic. I have reviewed all images personally.  FENO 12/03/16- 18  PFTs 02/22/17 FVC 1.89 [57%], FEV1 0.93 [40%], F/F 49, TLC 105%, RV/TLC 162%, DLCO 47% Severe obstruction with bronchodilator response, severe diffusion defect.  Air-trapping.  Assessment:  Severe COPD We will optimize his inhalers by switching to trelegy instead of Symbicort and Spiriva.  Continue on albuterol inhaler and nebulizer as needed He did not desat on exertion in office today Use Mucinex over-the-counter and flutter wall for clearance of secretion.  Abnormal CT scan CT scan noted with chronic left base scarring.  He reports thoracotomy at the age of 38 to remove "lung cyst" from left lung base.   1.5 cm left lower lobe lung nodule is not FDG avid and likely benign.  Plan/Recommendations: - Start trelegy, stop Symbicort and Spiriva - Albuterol as needed  Marshell Garfinkel MD  Pulmonary and Critical Care Pager [phone] 02/22/2017, 1:31 PM  CC: Glendale Chard, MD

## 2017-02-22 NOTE — Patient Instructions (Signed)
We will give you samples of trelegy and prescription Continue to use albuterol and nebs as needed We will check your O2 levels today. Use mucinex OTC and we will give you a flutter valve.  Follow up in 3-65months

## 2017-02-22 NOTE — Progress Notes (Signed)
PFT completed today 02/22/17

## 2017-02-24 DIAGNOSIS — N401 Enlarged prostate with lower urinary tract symptoms: Secondary | ICD-10-CM | POA: Diagnosis not present

## 2017-02-24 DIAGNOSIS — C669 Malignant neoplasm of unspecified ureter: Secondary | ICD-10-CM | POA: Diagnosis not present

## 2017-03-25 ENCOUNTER — Telehealth: Payer: Self-pay | Admitting: Pulmonary Disease

## 2017-03-25 DIAGNOSIS — R05 Cough: Secondary | ICD-10-CM | POA: Diagnosis not present

## 2017-03-25 MED ORDER — PREDNISONE 10 MG PO TABS: ORAL_TABLET | ORAL | 0 refills | Status: DC

## 2017-03-25 NOTE — Telephone Encounter (Signed)
Called and spoke with pt's wife letting her know we were sending an Rx of prednisone to their preferred pharmacy.  Stated to her we were doing a pred taper to follow the instructions on prescription for how pt was to take med.  [name] expressed understanding. Rx sent to preferred pharmacy. Nothing further needed at this current time.

## 2017-03-25 NOTE — Telephone Encounter (Signed)
Call in prednisone starting at 40 mg. Reduce dose by 10 mg every 2 days.

## 2017-03-25 NOTE — Telephone Encounter (Signed)
Called and spoke with pt's wife, [name] who states pt sounds very congested but states she does not think his chest is tight.  [name] states that pt is not running a fever. Pt has been coughing worse x2 weeks but is unsure of what color mucus is due to pt never spitting mucus up.  Pt denies any headache or body aches, only has the cough and congestion.  Pt does not want to come in for an appt.  Dr. Vaughan Browner, please advise on recommendations for pt. Thanks!

## 2017-03-30 ENCOUNTER — Other Ambulatory Visit: Payer: Self-pay | Admitting: Urology

## 2017-04-25 DIAGNOSIS — M21612 Bunion of left foot: Secondary | ICD-10-CM | POA: Diagnosis not present

## 2017-04-25 DIAGNOSIS — I1310 Hypertensive heart and chronic kidney disease without heart failure, with stage 1 through stage 4 chronic kidney disease, or unspecified chronic kidney disease: Secondary | ICD-10-CM | POA: Diagnosis not present

## 2017-04-25 DIAGNOSIS — M79672 Pain in left foot: Secondary | ICD-10-CM | POA: Diagnosis not present

## 2017-04-25 DIAGNOSIS — N182 Chronic kidney disease, stage 2 (mild): Secondary | ICD-10-CM | POA: Diagnosis not present

## 2017-04-28 ENCOUNTER — Ambulatory Visit (HOSPITAL_COMMUNITY)
Admission: RE | Admit: 2017-04-28 | Discharge: 2017-04-28 | Disposition: A | Discharge status: Home / Self Care | Payer: Medicare Other | Source: Ambulatory Visit | Attending: Cardiology | Admitting: Cardiology

## 2017-04-28 DIAGNOSIS — I70291 Other atherosclerosis of native arteries of extremities, right leg: Secondary | ICD-10-CM | POA: Insufficient documentation

## 2017-04-28 DIAGNOSIS — I714 Abdominal aortic aneurysm, without rupture, unspecified: Secondary | ICD-10-CM

## 2017-04-28 DIAGNOSIS — R109 Unspecified abdominal pain: Secondary | ICD-10-CM | POA: Insufficient documentation

## 2017-04-28 DIAGNOSIS — F17200 Nicotine dependence, unspecified, uncomplicated: Secondary | ICD-10-CM | POA: Insufficient documentation

## 2017-04-28 DIAGNOSIS — E785 Hyperlipidemia, unspecified: Secondary | ICD-10-CM | POA: Diagnosis not present

## 2017-04-28 DIAGNOSIS — I10 Essential (primary) hypertension: Secondary | ICD-10-CM | POA: Insufficient documentation

## 2017-04-28 DIAGNOSIS — I739 Peripheral vascular disease, unspecified: Secondary | ICD-10-CM | POA: Diagnosis not present

## 2017-04-28 DIAGNOSIS — I708 Atherosclerosis of other arteries: Secondary | ICD-10-CM | POA: Diagnosis not present

## 2017-05-02 ENCOUNTER — Other Ambulatory Visit: Payer: Self-pay | Admitting: Cardiovascular Disease

## 2017-05-02 DIAGNOSIS — E559 Vitamin D deficiency, unspecified: Secondary | ICD-10-CM | POA: Diagnosis not present

## 2017-05-02 DIAGNOSIS — I714 Abdominal aortic aneurysm, without rupture, unspecified: Secondary | ICD-10-CM

## 2017-05-02 DIAGNOSIS — E785 Hyperlipidemia, unspecified: Secondary | ICD-10-CM | POA: Diagnosis not present

## 2017-05-02 DIAGNOSIS — Z0000 Encounter for general adult medical examination without abnormal findings: Secondary | ICD-10-CM | POA: Diagnosis not present

## 2017-05-02 DIAGNOSIS — N183 Chronic kidney disease, stage 3 (moderate): Secondary | ICD-10-CM | POA: Diagnosis not present

## 2017-05-02 DIAGNOSIS — I2510 Atherosclerotic heart disease of native coronary artery without angina pectoris: Secondary | ICD-10-CM | POA: Diagnosis not present

## 2017-05-02 DIAGNOSIS — I739 Peripheral vascular disease, unspecified: Secondary | ICD-10-CM

## 2017-05-02 DIAGNOSIS — Z79899 Other long term (current) drug therapy: Secondary | ICD-10-CM | POA: Diagnosis not present

## 2017-05-02 DIAGNOSIS — R7309 Other abnormal glucose: Secondary | ICD-10-CM | POA: Diagnosis not present

## 2017-05-02 DIAGNOSIS — I1310 Hypertensive heart and chronic kidney disease without heart failure, with stage 1 through stage 4 chronic kidney disease, or unspecified chronic kidney disease: Secondary | ICD-10-CM | POA: Diagnosis not present

## 2017-05-02 DIAGNOSIS — M79672 Pain in left foot: Secondary | ICD-10-CM | POA: Diagnosis not present

## 2017-05-02 LAB — LIPID PANEL
CHOLESTEROL: 113 (ref 0–200)
HDL: 45 (ref 35–70)
LDL CALC: 50
LDl/HDL Ratio: 1.1
TRIGLYCERIDES: 91 (ref 40–160)

## 2017-05-02 LAB — BASIC METABOLIC PANEL
BUN: 19 (ref 4–21)
Creatinine: 1.2 (ref 0.6–1.3)
GLUCOSE: 77
POTASSIUM: 4.3 (ref 3.4–5.3)
Sodium: 144 (ref 137–147)

## 2017-05-02 LAB — CBC AND DIFFERENTIAL
HCT: 46 (ref 41–53)
HEMOGLOBIN: 15.2 (ref 13.5–17.5)
WBC: 12.2

## 2017-05-02 LAB — HEMOGLOBIN A1C: HEMOGLOBIN A1C: 6.4 — AB (ref 4.0–6.0)

## 2017-05-02 LAB — HEPATIC FUNCTION PANEL
ALK PHOS: 122 (ref 25–125)
ALT: 13 (ref 10–40)
AST: 15 (ref 14–40)

## 2017-05-02 LAB — VITAMIN D 25 HYDROXY (VIT D DEFICIENCY, FRACTURES): VIT D 25 HYDROXY: 51.6

## 2017-05-05 ENCOUNTER — Ambulatory Visit: Payer: Medicare Other | Admitting: Allergy & Immunology

## 2017-05-05 ENCOUNTER — Encounter: Payer: Self-pay | Admitting: Allergy & Immunology

## 2017-05-05 VITALS — BP 100/60 | HR 68 | Resp 20 | Ht 64.5 in | Wt 154.0 lb

## 2017-05-05 DIAGNOSIS — L508 Other urticaria: Secondary | ICD-10-CM | POA: Diagnosis not present

## 2017-05-05 NOTE — Patient Instructions (Addendum)
1. Chronic urticaria - Your history does not have any "red flags" such as fevers, joint pains, or permanent skin changes that would be concerning for a more serious cause of hives.  - We will get some labs to rule out serious causes of hives: tryptase level, alpha gal panel, ESR, and CRP. - We will get a copy of your labs that were taken recently.  - We will also get some labs ro tule out environmental allergens.  - Keep track of any foods that might be contributing to your symptoms.  - Chronic hives are often times a self limited process and will "burn themselves out" over 6-12 months, although this is not always the case.  - In the meantime, start suppressive dosing of antihistamines:    - Morning: Zyrtec (cetirizine) 102m (two tablets)  - Evening: Zyrtec (cetirizine) 119m(two tablets)  - If the above is not working, try adding: Zantac (ranitidine) 30082mtwo tablets) - You can change this dosing at home, decreasing the dose as needed or increasing the dosing as needed.  - If you are not tolerating the medications or are tired of taking them every day, we can start treatment with a monthly injectable medication called Xolair.   2. Return in about 2 months (around 07/05/2017).  Please inform us [place] any Emergency Department visits, hospitalizations, or changes in symptoms. Call us [place] going to the ED for breathing or allergy symptoms since we might be able to fit you in for a sick visit. Feel free to contact us [place] with any questions, problems, or concerns.  It was a pleasure to meet you and your family today!  Websites that have reliable patient information: 1. American Academy of Asthma, Allergy, and Immunology: www.aaaai.org 2. Food Allergy Research and Education (FARE): foodallergy.org 3. Mothers of Asthmatics: http://www.asthmacommunitynetwork.org 4. American College of Allergy, Asthma, and Immunology: www.acaai.org

## 2017-05-05 NOTE — Progress Notes (Signed)
NEW PATIENT  Date of Service/Encounter:  05/05/17  Referring provider: Glendale Chard, MD   Assessment:   Chronic idiopathic urticaria  Complex medical history, including gout, bladder cancer, and COPD   Plan/Recommendations:   1. Chronic urticaria - unknown trigger - Your history does not have any "red flags" such as fevers, joint pains, or permanent skin changes that would be concerning for a more serious cause of hives.  - We will get some labs to rule out serious causes of hives: tryptase level, alpha gal panel, ESR, and CRP. - We will get a copy of your labs that were taken recently, and I anticipate that she obtained a CBC with differential and a CMP.  - We will also get some labs ro tule out environmental allergens.  - Given the GI history, we will get labs to look for a Heliobacter pylori infection. - Keep track of any foods that might be contributing to your symptoms.  - Chronic hives are often times a self limited process and will "burn themselves out" over 6-12 months, although this is not always the case.  - In the meantime, start suppressive dosing of antihistamines:    - Morning: Zyrtec (cetirizine) 73m (two tablets)  - Evening: Zyrtec (cetirizine) 184m(two tablets)  - If the above is not working, try adding: Zantac (ranitidine) 30070mtwo tablets) - You can change this dosing at home, decreasing the dose as needed or increasing the dosing as needed.  - If you are not tolerating the medications or are tired of taking them every day, we can start treatment with a monthly injectable medication called Xolair.   2. Return in about 2 months (around 07/05/2017).  Subjective:   DonRAJ LANDRESS a 84 54o. male presenting today for evaluation of  Chief Complaint  Patient presents with  . Urticaria    ongoing x3-4 months. only change is laundry soap.   . CMarland KitchenPD    DonYASHAR INCLANs a history of the following: Patient Active Problem List   Diagnosis Date Noted  .  Hydronephrosis 05/08/2016  . Acute renal failure superimposed on stage 2 chronic kidney disease (HCCRyder4/08/2016  . Polyp of cecum   . Polyp of transverse colon   . BRBPR (bright red blood per rectum)   . Hematochezia   . Ischemic colitis ([place] . Ulceration, colon   . Left sided abdominal pain   . Rectal bleeding 03/15/2016  . Acute hyperglycemia 03/15/2016  . COPD exacerbation (HCCC[place] . Renal insufficiency   . PVD (peripheral vascular disease) (HCCFarm[place] . Ureteral calculus 10/10/2013  . Hyperlipidemia 07/24/2013  . S/P arterial stent, to Lt renal art. 11/02/12 11/03/2012  . Carotid artery disease (HCCDune Acres9/06/2012  . Renal artery stenosis, progression of disease 10/06/2012  . Abdominal aortic aneurysm (HCCMar[place]9/06/2012  . Normal coronary arteries-  07/21/2012  . PVD (peripheral vascular disease) with claudication, previous stents to bilateral iliacs. Rt. SFA also.  08/20/2011  . Claudication of both lower extremities (HCCWing[place]7/19/2013  . PVD S/P multiple PCIs 08/20/2011  . COPD (chronic obstructive pulmonary disease) (HCCEaton7/19/2013  . HTN (hypertension) 08/20/2011    History obtained from: chart review and patient and his delightful wife.  DonGeorjean Modes referred by SanGlendale ChardD.     DonJermar a [age]o. male presenting for an evaluation of urticaria. Unfortunately, he did have cetirizine this morning due to an outbreak. Symptoms have been ongoing to 2-3  months. When they started he was not doing anything out of the ordinary. These are not necessarily daily, but they happen 2-3 times per week typically. He has talked to his PCP who recommended that he take cetirizine, ranitidine BID, and Benadryl. This cleared it up completely. Hives occur over the entire body.  He did have some sensation of indigestion and globus sensation in his throat. This is no longer occurring with the hives. There are no permanent skin changes whatsoever. He does show me his leg and he does have some  residual pigmentation. He typically takes liquid Benadryl and a Pepcid when he breaks out. He denies exposure to new drugs that have triggered these reactions.   He is unsure of any food triggers. He does eat a lot of eggs fairly regularly. It was thought to be due to the high efficiency laundry detergent, but the breakouts are not really associated with exposure to the laundry detergent. He does eat red meat around two times per week. He denies recent tick bites.   He does have a runny nose throughout the year. He does not use a nose spray. He has never been allergy tested in the past. He does not use a daily antihistamine. He reports copious belching. He has never seen a gastroenterologist at this point. He does not have Pepcid on a daily basis, only with urticarial outbreaks.   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 He was treated with BCG. He underwent six treatments with that. This was diagnosed around one year ago and is doing well at this point. He does have a history of lower extremity claudication.   Mr. Klaiber does have a history of COPD and is on Trelegy one puff once daily. He does have a rescue inhaler. He did have some hospitalization for a COPD attack, but this is rather rare for him. He has never seen a Pulmonologist, but does have an appointment scheduled in the near future.   He did have annual labs drawn earlier this year but he is unsure of the results. Labs do not appear to be in Epic. Otherwise, there is no history of other atopic diseases, including drug allergies or stinging insect allergies. There is no significant infectious history. Vaccinations are up to date.    Past Medical History: Patient Active Problem List   Diagnosis Date Noted  . Hydronephrosis 05/08/2016  . Acute renal failure superimposed on stage 2 chronic kidney disease ([place]) 05/08/2016  .  Polyp of cecum   . Polyp of transverse colon   . BRBPR (bright red blood per rectum)   . Hematochezia   . Ischemic colitis ([place])   . Ulceration, colon   . Left sided abdominal pain   . Rectal bleeding 03/15/2016  . Acute hyperglycemia 03/15/2016  . COPD exacerbation ([place])   . Renal insufficiency   . PVD (peripheral vascular disease) ([place])   . Ureteral calculus 10/10/2013  . Hyperlipidemia 07/24/2013  . S/P arterial stent, to Lt renal art. 11/02/12 11/03/2012  . Carotid artery disease ([place]) 10/06/2012  . Renal artery stenosis, progression of disease 10/06/2012  . Abdominal aortic aneurysm ([place]) 10/06/2012  . Normal coronary arteries-  07/21/2012  . PVD (peripheral vascular disease) with claudication, previous stents to bilateral iliacs. Rt. SFA also.  08/20/2011  . Claudication of both lower extremities ([place]) 08/20/2011  . PVD S/P multiple PCIs  08/20/2011  . COPD (chronic obstructive pulmonary disease) ([place]) 08/20/2011  . HTN (hypertension) 08/20/2011    Medication List:  Allergies as of 05/05/2017      Reactions   Pneumococcal Vaccines Swelling, Other (See Comments)   Lip swelling      Medication List        Accurate as of 05/05/17  2:24 PM. Always use your most recent med list.          albuterol 108 (90 Base) MCG/ACT inhaler Commonly known as:  PROVENTIL HFA;VENTOLIN HFA Inhale 2 puffs into the lungs every 6 (six) hours as needed for wheezing or shortness of breath.   albuterol (2.5 MG/3ML) 0.083% nebulizer solution Commonly known as:  PROVENTIL Take 2.5 mg by nebulization every 6 (six) hours as needed for wheezing or shortness of breath.   amLODipine 5 MG tablet Commonly known as:  NORVASC Take 5 mg by mouth daily.   atorvastatin 20 MG tablet Commonly known as:  LIPITOR TAKE 1 TABLET BY MOUTH  DAILY AT 6 PM.   BYSTOLIC 10 MG tablet Generic drug:  nebivolol Take 10 mg by mouth daily.   clopidogrel 75 MG tablet Commonly known as:  PLAVIX TAKE 1 TABLET BY  MOUTH  EVERY DAY   [employer] D3 2000 units Caps Generic drug:  Cholecalciferol Take 2,000 Units by mouth daily.   DELSYM 30 MG/5ML liquid Generic drug:  dextromethorphan Take 30 mg by mouth daily as needed for cough.   finasteride 5 MG tablet Commonly known as:  PROSCAR Take 5 mg by mouth daily.   Fluticasone-Umeclidin-Vilant 100-62.5-25 MCG/INH Aepb Commonly known as:  TRELEGY ELLIPTA Inhale 1 puff into the lungs daily.   FLUTTER Devi Use as directed   GOODY HEADACHE PO Take 1 packet by mouth daily as needed (for headache).   Magnesium 500 MG Caps Take 500 mg by mouth daily.   pantoprazole 40 MG tablet Commonly known as:  PROTONIX Take 40 mg by mouth daily.   ranitidine 150 MG tablet Commonly known as:  ZANTAC   tamsulosin 0.4 MG Caps capsule Commonly known as:  FLOMAX Take 0.4 mg by mouth daily.       Birth History: non-contributory.   Developmental History: non-contributory.   Past Surgical History: Past Surgical History:  Procedure Laterality Date  . ABDOMINAL ANGIOGRAM  08/19/2011   Left common iliac artery at the ostium, 6x80m ICAST covered stent, common femoral artery, 8x1051mAbbott absolute pro nitinol self-expanding stent and resulting in reduction of 80 and 90% stenosis to 0% residual  . ABDOMINAL ANGIOGRAM  10/08/2010   Mid-right SFA, 6x120 EV3 Protege nitinol self-expanding stent, resulting in reduction of CTO to 0% residual  . ABDOMINAL ANGIOGRAM  06/22/2010   Rt Common Femoral Artery-8x3 Absolute Pro Nitinol self-expanding stent-90% stenosis to 0% residual; Rt External Iliac Artery-9x3 Absolute Pro Nitinol self-expanding stent-70% stenosis to 0% residual; Left External Iliac Artery-8x4 Absolute Pro Nitinol self-expanding stent-90% to 0% residual  . ATHERECTOMY N/A 08/19/2011   Procedure: ATHERECTOMY;  Surgeon: JoLorretta HarpMD;  [facility];  Service: Cardiovascular;  Laterality: N/A;  . BACK SURGERY    . CARDIAC CATHETERIZATION  10/26/2002   dr mcTami Ribas normal coronary arteries/ normal  LVF  . CARDIOVASCULAR STRESS TEST  06-12-2010   dr berry   normal perfusion study/ no ischemia/  ef 69%  . COLONOSCOPY WITH PROPOFOL N/A 03/19/2016   Procedure: COLONOSCOPY WITH PROPOFOL;  Surgeon: KaMauri PoleMD;  [facility]  Service: Endoscopy;  Laterality: N/A;  was inpatient, but got d/c'd home and is coming back as an OP  . CYSTOSCOPY W/ URETERAL STENT PLACEMENT Left 06/07/2016   Procedure: CYSTOSCOPY WITH RETROGRADE PYELOGRAM/URETERAL STENT REPLACEMENT;  Surgeon: Cleon Gustin, MD;  [facility];  Service: Urology;  Laterality: Left;  . CYSTOSCOPY W/ URETERAL STENT PLACEMENT Left 09/20/2016   Procedure: CYSTOSCOPY WITH RETROGRADE PYELOGRAM/URETERAL STENT PLACEMENT;  Surgeon: Cleon Gustin, MD;  Location: [facility];  Service: Urology;  Laterality: Left;  . CYSTOSCOPY WITH RETROGRADE PYELOGRAM, URETEROSCOPY AND STENT PLACEMENT Right 10/10/2013   Procedure: uretheral dilation, CYSTOSCOPY, URETEROSCOPY, stone extraction, STENT PLACEMENT;  Surgeon: [name], MD;  [facility];  Service: Urology;  Laterality: Right;  . CYSTOSCOPY WITH RETROGRADE PYELOGRAM, URETEROSCOPY AND STENT PLACEMENT Left 02/08/2017   Procedure: CYSTOSCOPY WITH RETROGRADE PYELOGRAM, URETEROSCOPY;  Surgeon: Cleon Gustin, MD;  [facility];  Service: Urology;  Laterality: Left;  . CYSTOSCOPY WITH URETEROSCOPY Left 05/09/2016   Procedure: CYSTOSCOPY, RETROGRADE, LEFT URETEROSCOPY, LEFT URETERAL BIOPSIES, HOLMIUM LASER FULGERATION, LEFT URETERAL STENT PLACEMENT;  Surgeon: Irine Seal, MD;  [facility];  Service: Urology;  Laterality: Left;  . CYSTOSCOPY/URETEROSCOPY/HOLMIUM LASER Left 09/20/2016   Procedure: CYSTOSCOPY/URETEROSCOPY/HOLMIUM LASER;  Surgeon: Cleon Gustin, MD;  [facility];  Service: Urology;  Laterality: Left;  . HOLMIUM LASER APPLICATION Right 06/05/7320   Procedure: HOLMIUM LASER  APPLICATION;  Surgeon: [name], MD;  Location[facility];  Service: Urology;  Laterality: Right;  . HOLMIUM LASER APPLICATION Left 0/03/5425   Procedure: TUMOR ABLATION WITH HOLMIUM LASER;  Surgeon: Cleon Gustin, MD;  [facility];  Service: Urology;  Laterality: Left;  . LUMBAR DISC SURGERY  1970's  . RENAL ANGIOGRAM N/A 11/02/2012   Procedure: RENAL ANGIOGRAM;  Surgeon: Lorretta Harp, MD;  [facility];  Service: Cardiovascular;  Laterality: N/A;  . RENAL ARTERY STENT Left 11/02/2012  . SKIN CANCER EXCISION Right 2017   tem[facility]  . SOFT TISSUE CYST EXCISION  1960's   "outside part of lung LLL"     Family History: Family History  Problem Relation Age of Onset  . Hypertension Mother   . Cancer Father   . Diabetes Brother   . Hypertension Son      Social History: Knowledge lives at home with his wife of 79 years. They live in a home that is 82 years old. There is carpeting throughout the home. They have gas and electric heating. There is central cooling. There is one dog in the home. There are dust mite coverings on the bedding and the pillows. There is no tobacco exposure in the home. He smoked from the age of 81 through 2012.      Review of Systems: a 14-point review of systems is pertinent for what is mentioned in HPI.  Otherwise, all other systems were negative. Constitutional: negative other than that listed in the HPI Eyes: negative other than that listed in the HPI Ears, nose, mouth, throat, and face: negative other than that listed in the HPI Respiratory: negative other than that listed in the HPI Cardiovascular: negative other than that listed in the HPI Gastrointestinal: negative other than that listed in the HPI Genitourinary: negative other than that listed in the HPI Integument: negative other than that listed in the HPI Hematologic: negative other than that listed in the HPI Musculoskeletal: negative other than that listed in  the HPI Neurological: negative other than that  listed in the HPI Allergy/Immunologic: negative other than that listed in the HPI    Objective:   Blood pressure 100/60, pulse 68, resp. rate 20, height 5' 4.5" (1.638 m), weight 154 lb (69.9 kg), SpO2 92 %. Body mass index is 26.03 kg/m.   Physical Exam:  General: Alert, interactive, in no acute distress. Talkative. Well mannered.  Eyes: No conjunctival injection bilaterally, no discharge on the right, no discharge on the left and no Horner-Trantas dots present. PERRL bilaterally. EOMI without pain. No photophobia.  Ears: Right TM pearly gray with normal light reflex, Left TM pearly gray with normal light reflex, Right TM intact without perforation and Left TM intact without perforation.  Nose/Throat: External nose within normal limits and septum midline. Turbinates edematous without discharge. Posterior oropharynx mildly erythematous without cobblestoning in the posterior oropharynx. Tonsils 2+ without exudates.  Tongue without thrush. Neck: Supple without thyromegaly. Trachea midline. Adenopathy: no enlarged lymph nodes appreciated in the anterior cervical, occipital, axillary, epitrochlear, inguinal, or popliteal regions. Lungs: Clear to auscultation without wheezing, rhonchi or rales. No increased work of breathing. CV: Normal S1/S2. No murmurs. Capillary refill <2 seconds.  Abdomen: Nondistended, nontender. No guarding or rebound tenderness. Bowel sounds present in all fields and hypoactive  Skin: Warm and dry, without lesions or rashes. Extremities:  No clubbing, cyanosis or edema. Neuro:   Grossly intact. No focal deficits appreciated. Responsive to questions.  Diagnostic studies: deferred due to recent antihistamine use     Salvatore Marvel, MD [facility] [place] [facility] [place]

## 2017-05-10 DIAGNOSIS — C669 Malignant neoplasm of unspecified ureter: Secondary | ICD-10-CM | POA: Diagnosis not present

## 2017-05-11 NOTE — Progress Notes (Signed)
04/28/2017-noted in Epic- VAS [place] AAA Duplex complete  11/20/2017-noted in Epic-PET scan  12/13/2016-noted in Epic-CT chest wo contrast  11/21/2016- noted in Epic- CXR and EKG

## 2017-05-11 NOTE — Patient Instructions (Addendum)
Douglas Monroe  05/11/2017   Your procedure is scheduled on: Monday 05/16/2017  Report to [facility] Main  Entrance   Report to admitting at  1045 AM    Call this number if you have problems the morning of surgery [phone]    Remember: Do not eat food or drink liquids :After Midnight.      Take these medicines the morning of surgery with A SIP OF WATER: Bystolic, Amlodipine (Norvasc), use Trelegy inhaler, use Albuterol inhaler and bring inhalers with you to the hospital the morning of surgery, use Albuterol nebulizer if needed                                 You may not have any metal on your body including hair pins and              piercings  Do not wear jewelry, make-up, lotions, powders or perfumes, deodorant             Do not wear nail polish.  Do not shave  48 hours prior to surgery.              Men may shave face and neck.   Do not bring valuables to the hospital. [place].  Contacts, dentures or bridgework may not be worn into surgery.  Leave suitcase in the car. After surgery it may be brought to your room.     Patients discharged the day of surgery will not be allowed to drive home.  Name and phone number of your driver:  Spouse- Douglas Monroe               Please read over the following fact sheets you were given: _____________________________________________________________________             [facility] - Preparing for Surgery Before surgery, you can play an important role.  Because skin is not sterile, your skin needs to be as free of germs as possible.  You can reduce the number of germs on your skin by washing with CHG (chlorahexidine gluconate) soap before surgery.  CHG is an antiseptic cleaner which kills germs and bonds with the skin to continue killing germs even after washing. Please DO NOT use if you have an allergy to CHG or antibacterial soaps.  If your skin becomes  reddened/irritated stop using the CHG and inform your nurse when you arrive at Short Stay. Do not shave (including legs and underarms) for at least 48 hours prior to the first CHG shower.  You may shave your face/neck. Please follow these instructions carefully:  1.  Shower with CHG Soap the night before surgery and the  morning of Surgery.  2.  If you choose to wash your hair, wash your hair first as usual with your  normal  shampoo.  3.  After you shampoo, rinse your hair and body thoroughly to remove the  shampoo.                           4.  Use CHG as you would any other liquid soap.  You can apply chg directly  to the skin and wash  Gently with a scrungie or clean washcloth.  5.  Apply the CHG Soap to your body ONLY FROM THE NECK DOWN.   Do not use on face/ open                           Wound or open sores. Avoid contact with eyes, ears mouth and genitals (private parts).                       Wash face,  Genitals (private parts) with your normal soap.             6.  Wash thoroughly, paying special attention to the area where your surgery  will be performed.  7.  Thoroughly rinse your body with warm water from the neck down.  8.  DO NOT shower/wash with your normal soap after using and rinsing off  the CHG Soap.                9.  Pat yourself dry with a clean towel.            10.  Wear clean pajamas.            11.  Place clean sheets on your bed the night of your first shower and do not  sleep with pets. Day of Surgery : Do not apply any lotions/deodorants the morning of surgery.  Please wear clean clothes to the hospital/surgery center.  FAILURE TO FOLLOW THESE INSTRUCTIONS MAY RESULT IN THE CANCELLATION OF YOUR SURGERY PATIENT SIGNATURE_________________________________  NURSE SIGNATURE__________________________________  ________________________________________________________________________

## 2017-05-12 ENCOUNTER — Other Ambulatory Visit: Payer: Self-pay | Admitting: Cardiovascular Disease

## 2017-05-12 ENCOUNTER — Encounter (HOSPITAL_COMMUNITY): Payer: Self-pay

## 2017-05-12 ENCOUNTER — Encounter (HOSPITAL_COMMUNITY)
Admission: RE | Admit: 2017-05-12 | Discharge: 2017-05-12 | Disposition: A | Discharge status: Home / Self Care | Payer: Medicare Other | Source: Ambulatory Visit | Attending: Urology | Admitting: Urology

## 2017-05-12 ENCOUNTER — Other Ambulatory Visit: Payer: Self-pay

## 2017-05-12 DIAGNOSIS — Z01818 Encounter for other preprocedural examination: Secondary | ICD-10-CM | POA: Diagnosis not present

## 2017-05-12 DIAGNOSIS — C662 Malignant neoplasm of left ureter: Secondary | ICD-10-CM | POA: Diagnosis not present

## 2017-05-12 DIAGNOSIS — I6523 Occlusion and stenosis of bilateral carotid arteries: Secondary | ICD-10-CM

## 2017-05-12 LAB — IGE+ALLERGENS ZONE 2(30)
Alternaria Alternata IgE: <0.1 kU/L
Amer Sycamore IgE Qn: <0.1 kU/L
Bahia Grass IgE: <0.1 kU/L
Bermuda Grass IgE: <0.1 kU/L
Cat Dander IgE: 5.51 kU/L — AB
Cedar, Mountain IgE: <0.1 kU/L
Cladosporium Herbarum IgE: <0.1 kU/L
Common Silver Birch IgE: <0.1 kU/L
D Farinae IgE: 0.1 kU/L — AB
D Pteronyssinus IgE: 0.39 kU/L — AB
E005-IGE DOG DANDER: 7.75 kU/L — AB
Elm, American IgE: <0.1 kU/L
IgE (Immunoglobulin E), Serum: 761 IU/mL — ABNORMAL HIGH (ref 6–495)
Johnson Grass IgE: <0.1 kU/L
M001-IGE PENICILLIUM CHRYSOGEN: 0.14 kU/L — AB
Mugwort IgE Qn: <0.1 kU/L
Pigweed, Rough IgE: <0.1 kU/L
Ragweed, Short IgE: 0.1 kU/L — AB
Stemphylium Herbarum IgE: <0.1 kU/L
Timothy Grass IgE: <0.1 kU/L
White Mulberry IgE: <0.1 kU/L

## 2017-05-12 LAB — CBC
HCT: 42.4 % (ref 39.0–52.0)
Hemoglobin: 14.1 g/dL (ref 13.0–17.0)
MCH: 31.2 pg (ref 26.0–34.0)
MCHC: 33.3 g/dL (ref 30.0–36.0)
MCV: 93.8 fL (ref 78.0–100.0)
Platelets: 194 10*3/uL (ref 150–400)
RBC: 4.52 MIL/uL (ref 4.22–5.81)
RDW: 13.9 % (ref 11.5–15.5)
WBC: 7.6 10*3/uL (ref 4.0–10.5)

## 2017-05-12 LAB — ALPHA-GAL PANEL
ALPHA GAL IGE: 64.3 kU/L — AB (ref <0.10)
BEEF (BOS SPP) IGE: 27.7 kU/L — AB (ref <0.35)
BEEF CLASS INTERPRETATION: 4
Class Interpretation: 3
Class Interpretation: 3
LAMB/MUTTON (OVIS SPP) IGE: 9.08 kU/L — AB (ref <0.35)
Pork (Sus spp) IgE: 16.7 kU/L — ABNORMAL HIGH (ref <0.35)

## 2017-05-12 LAB — BASIC METABOLIC PANEL
Anion gap: 10 (ref 5–15)
BUN: 20 mg/dL (ref 6–20)
CO2: 25 mmol/L (ref 22–32)
CREATININE: 1.23 mg/dL (ref 0.61–1.24)
Calcium: 9.4 mg/dL (ref 8.9–10.3)
Chloride: 109 mmol/L (ref 101–111)
GFR calc Af Amer: >60 mL/min (ref >60)
GFR calc non Af Amer: 53 mL/min — ABNORMAL LOW (ref >60)
GLUCOSE: 116 mg/dL — AB (ref 65–99)
POTASSIUM: 4.3 mmol/L (ref 3.5–5.1)
Sodium: 144 mmol/L (ref 135–145)

## 2017-05-12 LAB — TRYPTASE: Tryptase: 7.6 ug/L (ref 2.2–13.2)

## 2017-05-12 LAB — H. PYLORI ANTIBODY, IGG

## 2017-05-16 ENCOUNTER — Ambulatory Visit (HOSPITAL_COMMUNITY)
Admission: RE | Admit: 2017-05-16 | Discharge: 2017-05-16 | Disposition: A | Discharge status: Home / Self Care | Payer: Medicare Other | Source: Ambulatory Visit | Attending: Urology | Admitting: Urology

## 2017-05-16 ENCOUNTER — Encounter (HOSPITAL_COMMUNITY)
Admission: RE | Disposition: A | Discharge status: Home / Self Care | Payer: Self-pay | Source: Ambulatory Visit | Attending: Urology

## 2017-05-16 ENCOUNTER — Ambulatory Visit (HOSPITAL_COMMUNITY): Payer: Medicare Other | Admitting: Certified Registered"

## 2017-05-16 ENCOUNTER — Encounter (HOSPITAL_COMMUNITY): Payer: Self-pay | Admitting: *Deleted

## 2017-05-16 ENCOUNTER — Ambulatory Visit (HOSPITAL_COMMUNITY): Payer: Medicare Other

## 2017-05-16 ENCOUNTER — Other Ambulatory Visit: Payer: Self-pay

## 2017-05-16 DIAGNOSIS — N1330 Unspecified hydronephrosis: Secondary | ICD-10-CM

## 2017-05-16 DIAGNOSIS — J439 Emphysema, unspecified: Secondary | ICD-10-CM | POA: Insufficient documentation

## 2017-05-16 DIAGNOSIS — C661 Malignant neoplasm of right ureter: Secondary | ICD-10-CM | POA: Diagnosis not present

## 2017-05-16 DIAGNOSIS — Z87891 Personal history of nicotine dependence: Secondary | ICD-10-CM | POA: Insufficient documentation

## 2017-05-16 DIAGNOSIS — C679 Malignant neoplasm of bladder, unspecified: Secondary | ICD-10-CM | POA: Diagnosis not present

## 2017-05-16 DIAGNOSIS — E785 Hyperlipidemia, unspecified: Secondary | ICD-10-CM | POA: Insufficient documentation

## 2017-05-16 DIAGNOSIS — K219 Gastro-esophageal reflux disease without esophagitis: Secondary | ICD-10-CM | POA: Diagnosis not present

## 2017-05-16 DIAGNOSIS — I739 Peripheral vascular disease, unspecified: Secondary | ICD-10-CM | POA: Diagnosis not present

## 2017-05-16 DIAGNOSIS — Z95820 Peripheral vascular angioplasty status with implants and grafts: Secondary | ICD-10-CM | POA: Diagnosis not present

## 2017-05-16 DIAGNOSIS — C662 Malignant neoplasm of left ureter: Secondary | ICD-10-CM | POA: Diagnosis not present

## 2017-05-16 DIAGNOSIS — Z79899 Other long term (current) drug therapy: Secondary | ICD-10-CM | POA: Diagnosis not present

## 2017-05-16 DIAGNOSIS — Z85828 Personal history of other malignant neoplasm of skin: Secondary | ICD-10-CM | POA: Diagnosis not present

## 2017-05-16 DIAGNOSIS — C675 Malignant neoplasm of bladder neck: Secondary | ICD-10-CM | POA: Insufficient documentation

## 2017-05-16 DIAGNOSIS — D494 Neoplasm of unspecified behavior of bladder: Secondary | ICD-10-CM | POA: Diagnosis not present

## 2017-05-16 DIAGNOSIS — I714 Abdominal aortic aneurysm, without rupture: Secondary | ICD-10-CM | POA: Insufficient documentation

## 2017-05-16 DIAGNOSIS — Z7902 Long term (current) use of antithrombotics/antiplatelets: Secondary | ICD-10-CM | POA: Insufficient documentation

## 2017-05-16 DIAGNOSIS — G4730 Sleep apnea, unspecified: Secondary | ICD-10-CM | POA: Insufficient documentation

## 2017-05-16 DIAGNOSIS — I10 Essential (primary) hypertension: Secondary | ICD-10-CM | POA: Insufficient documentation

## 2017-05-16 DIAGNOSIS — Z7951 Long term (current) use of inhaled steroids: Secondary | ICD-10-CM | POA: Insufficient documentation

## 2017-05-16 HISTORY — PX: CYSTOSCOPY WITH RETROGRADE PYELOGRAM, URETEROSCOPY AND STENT PLACEMENT: SHX5789

## 2017-05-16 SURGERY — CYSTOURETEROSCOPY, WITH RETROGRADE PYELOGRAM AND STENT INSERTION
Anesthesia: General | Laterality: Left

## 2017-05-16 MED ORDER — EPHEDRINE SULFATE 50 MG/ML IJ SOLN
INTRAMUSCULAR | Status: DC | PRN
  Administered 2017-05-16: 5 mg via INTRAVENOUS

## 2017-05-16 MED ORDER — LIDOCAINE 2% (20 MG/ML) 5 ML SYRINGE
INTRAMUSCULAR | Status: AC
  Filled 2017-05-16: qty 5

## 2017-05-16 MED ORDER — STERILE WATER FOR IRRIGATION IR SOLN
Status: DC | PRN
  Administered 2017-05-16: 3000 mL via INTRAVESICAL

## 2017-05-16 MED ORDER — IOHEXOL 300 MG/ML  SOLN
INTRAMUSCULAR | Status: DC | PRN
  Administered 2017-05-16: 10 mL via URETHRAL

## 2017-05-16 MED ORDER — DEXAMETHASONE SODIUM PHOSPHATE 10 MG/ML IJ SOLN
INTRAMUSCULAR | Status: DC | PRN
  Administered 2017-05-16: 10 mg via INTRAVENOUS

## 2017-05-16 MED ORDER — HYDROCODONE-ACETAMINOPHEN 5-325 MG PO TABS: 1.0000 | ORAL_TABLET | ORAL | 0 refills | Status: DC | PRN

## 2017-05-16 MED ORDER — EPHEDRINE 5 MG/ML INJ
INTRAVENOUS | Status: AC
  Filled 2017-05-16: qty 10

## 2017-05-16 MED ORDER — SODIUM CHLORIDE 0.9 % IR SOLN
Status: DC | PRN
  Administered 2017-05-16: 4000 mL via INTRAVESICAL

## 2017-05-16 MED ORDER — CEFAZOLIN SODIUM-DEXTROSE 2-4 GM/100ML-% IV SOLN
2.0000 g | INTRAVENOUS | Status: AC
  Administered 2017-05-16: 2 g via INTRAVENOUS
  Filled 2017-05-16: qty 100

## 2017-05-16 MED ORDER — OXYCODONE HCL 5 MG/5ML PO SOLN
5.0000 mg | Freq: Once | ORAL | Status: DC | PRN
  Filled 2017-05-16: qty 5

## 2017-05-16 MED ORDER — DEXAMETHASONE SODIUM PHOSPHATE 10 MG/ML IJ SOLN
INTRAMUSCULAR | Status: AC
  Filled 2017-05-16: qty 1

## 2017-05-16 MED ORDER — LIDOCAINE 2% (20 MG/ML) 5 ML SYRINGE
INTRAMUSCULAR | Status: DC | PRN
  Administered 2017-05-16: 15 mg via INTRAVENOUS

## 2017-05-16 MED ORDER — FENTANYL CITRATE (PF) 100 MCG/2ML IJ SOLN
INTRAMUSCULAR | Status: DC | PRN
  Administered 2017-05-16: 25 ug via INTRAVENOUS

## 2017-05-16 MED ORDER — ONDANSETRON HCL 4 MG/2ML IJ SOLN
INTRAMUSCULAR | Status: DC | PRN
  Administered 2017-05-16: 4 mg via INTRAVENOUS

## 2017-05-16 MED ORDER — FENTANYL CITRATE (PF) 100 MCG/2ML IJ SOLN
INTRAMUSCULAR | Status: AC
  Filled 2017-05-16: qty 2

## 2017-05-16 MED ORDER — ONDANSETRON HCL 4 MG/2ML IJ SOLN
INTRAMUSCULAR | Status: AC
  Filled 2017-05-16: qty 2

## 2017-05-16 MED ORDER — LACTATED RINGERS IV SOLN
INTRAVENOUS | Status: DC
  Administered 2017-05-16: 11:00:00 via INTRAVENOUS

## 2017-05-16 MED ORDER — PROPOFOL 10 MG/ML IV BOLUS
INTRAVENOUS | Status: AC
  Filled 2017-05-16: qty 20

## 2017-05-16 MED ORDER — PROPOFOL 10 MG/ML IV BOLUS
INTRAVENOUS | Status: DC | PRN
  Administered 2017-05-16: 30 mg via INTRAVENOUS
  Administered 2017-05-16: 90 mg via INTRAVENOUS

## 2017-05-16 MED ORDER — HYDROMORPHONE HCL 1 MG/ML IJ SOLN: 0.2500 mg | INTRAMUSCULAR | Status: DC | PRN

## 2017-05-16 MED ORDER — ONDANSETRON HCL 4 MG/2ML IJ SOLN: 4.0000 mg | Freq: Once | INTRAMUSCULAR | Status: DC | PRN

## 2017-05-16 MED ORDER — OXYCODONE HCL 5 MG PO TABS: 5.0000 mg | ORAL_TABLET | Freq: Once | ORAL | Status: DC | PRN

## 2017-05-16 SURGICAL SUPPLY — 22 items
BAG URO CATCHER STRL LF (MISCELLANEOUS) ×3 IMPLANT
CABLE HIGH FREQUENCY MONO STRZ (ELECTRODE) ×2 IMPLANT
CATH INTERMIT  6FR 70CM (CATHETERS) ×3 IMPLANT
CLOTH BEACON ORANGE TIMEOUT ST (SAFETY) ×1 IMPLANT
COVER FOOTSWITCH UNIV (MISCELLANEOUS) IMPLANT
ELECT COAG BALL END 3FR (ELECTROSURGICAL) ×3
ELECTRODE COAG BALL END 3FR (ELECTROSURGICAL) IMPLANT
EXTRACTOR STONE NITINOL NGAGE (UROLOGICAL SUPPLIES) IMPLANT
FIBER LASER TRAC TIP (UROLOGICAL SUPPLIES) IMPLANT
GLOVE BIO SURGEON STRL SZ8 (GLOVE) ×3 IMPLANT
GOWN STRL REUS W/TWL XL LVL3 (GOWN DISPOSABLE) ×3 IMPLANT
GUIDEWIRE ANG ZIPWIRE 038X150 (WIRE) ×3 IMPLANT
GUIDEWIRE STR DUAL SENSOR (WIRE) IMPLANT
IV NS 1000ML (IV SOLUTION)
IV NS 1000ML BAXH (IV SOLUTION) ×1 IMPLANT
MANIFOLD NEPTUNE II (INSTRUMENTS) ×3 IMPLANT
PACK CYSTO (CUSTOM PROCEDURE TRAY) ×3 IMPLANT
SHEATH URETERAL 12FRX35CM (MISCELLANEOUS) IMPLANT
STENT CONTOUR 6FRX26X.038 (STENTS) ×2 IMPLANT
TUBE FEEDING 8FR 16IN STR KANG (MISCELLANEOUS) IMPLANT
TUBING CONNECTING 10 (TUBING) ×2 IMPLANT
TUBING CONNECTING 10' (TUBING) ×1

## 2017-05-16 NOTE — Anesthesia Procedure Notes (Signed)
Procedure Name: LMA Insertion Date/Time: 05/16/2017 11:37 AM Performed by: Pilar Grammes, CRNA Pre-anesthesia Checklist: Patient identified, Emergency Drugs available, Suction available, Patient being monitored and Timeout performed Patient Re-evaluated:Patient Re-evaluated prior to induction Oxygen Delivery Method: Circle system utilized Preoxygenation: Pre-oxygenation with 100% oxygen Induction Type: IV induction Ventilation: Mask ventilation without difficulty LMA: LMA inserted and LMA flexible inserted LMA Size: 4.0 Number of attempts: 1

## 2017-05-16 NOTE — H&P (Signed)
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 He is here for surveillance cystoscopy. No new LUTS. No fevers/chills/sweats. No hematuria or dysuria.  Past Medical History:  Diagnosis Date  . Abdominal aortic aneurysm ([place])    infrarenal -- monitored by dr berry (note states stable 4.0 to 4.1cm)  . Anxiety    situational  . Arthritis    "hands" (03/15/2016)  . Cancer of skin of temple 2017   right  . COPD with emphysema ([place])   . Dyspnea    with exertion  . Exertional dyspnea   . First degree heart block   . GERD (gastroesophageal reflux disease)   . Headache(784.0)    uses goody powder; "maybe weekly, maybe not that much" (03/15/2016)  . History of kidney stones   . HOH (hard of hearing)    left ear; no hearing aid   . HTN (hypertension) 08/20/2011  . Hyperlipidemia   . Hypertension   . Ischemic colitis ([place])   . Left carotid artery stenosis    moderate  . Migraine    "long time ago" (03/15/2016)  . 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 PVD (peripheral vascular disease) with claudication [facility]) cardiologist-  dr berry   s/p bilateral iliac stents and right sfa stenting/  doppper study 09-05-2012  revealed ABIs close to one bilaterally with patent stents/  stenting left renal artery stenosis   . Right ureteral stone   . S/P arterial stent    left renal angioplasty and stent 11-02-2012  . Sleep apnea    at risk for OSA. stop/bang score= 6; sent to PCP 10/04/13; denies use of mask on 03/15/2016  . Tubular adenoma of colon   . Urticaria    Past Surgical History:  Procedure Laterality Date  . ABDOMINAL ANGIOGRAM  08/19/2011   Left common iliac artery at the ostium, 6x35mm ICAST covered stent, common femoral artery, 8x133mm Abbott absolute pro nitinol self-expanding stent and resulting in reduction of 80 and 90% stenosis to 0% residual  .  ABDOMINAL ANGIOGRAM  10/08/2010   Mid-right SFA, 6x120 EV3 Protege nitinol self-expanding stent, resulting in reduction of CTO to 0% residual  . ABDOMINAL ANGIOGRAM  06/22/2010   Rt Common Femoral Artery-8x3 Absolute Pro Nitinol self-expanding stent-90% stenosis to 0% residual; Rt External Iliac Artery-9x3 Absolute Pro Nitinol self-expanding stent-70% stenosis to 0% residual; Left External Iliac Artery-8x4 Absolute Pro Nitinol self-expanding stent-90% to 0% residual  . ATHERECTOMY N/A 08/19/2011   Procedure: ATHERECTOMY;  Surgeon: Lorretta Harp, MD;  Location: [facility] CATH LAB;  Service: Cardiovascular;  Laterality: N/A;  . BACK SURGERY    . CARDIAC CATHETERIZATION  10/26/2002  dr Tami Ribas   normal coronary arteries/ normal  LVF  . CARDIOVASCULAR STRESS TEST  06-12-2010   dr berry   normal perfusion study/ no ischemia/  ef 69%  . COLONOSCOPY WITH PROPOFOL N/A 03/19/2016   Procedure: COLONOSCOPY WITH PROPOFOL;  Surgeon: Mauri Pole, MD;  Location: [facility];  Service: Endoscopy;  Laterality: N/A;  was inpatient, but got d/c'd home and is coming back as an OP  . CYSTOSCOPY W/ URETERAL STENT PLACEMENT Left 06/07/2016   Procedure: CYSTOSCOPY WITH RETROGRADE PYELOGRAM/URETERAL STENT REPLACEMENT;  Surgeon: Cleon Gustin, MD;  Location: [facility];  Service: Urology;  Laterality: Left;  . CYSTOSCOPY W/ URETERAL STENT PLACEMENT Left 09/20/2016   Procedure:  CYSTOSCOPY WITH RETROGRADE PYELOGRAM/URETERAL STENT PLACEMENT;  Surgeon: Cleon Gustin, MD;  Location: [facility];  Service: Urology;  Laterality: Left;  . CYSTOSCOPY WITH RETROGRADE PYELOGRAM, URETEROSCOPY AND STENT PLACEMENT Right 10/10/2013   Procedure: uretheral dilation, CYSTOSCOPY, URETEROSCOPY, stone extraction, STENT PLACEMENT;  Surgeon: [name], MD;  Location: [facility];  Service: Urology;  Laterality: Right;  . CYSTOSCOPY WITH RETROGRADE PYELOGRAM, URETEROSCOPY AND STENT PLACEMENT Left 02/08/2017   Procedure: CYSTOSCOPY  WITH RETROGRADE PYELOGRAM, URETEROSCOPY;  Surgeon: Cleon Gustin, MD;  [facility];  Service: Urology;  Laterality: Left;  . CYSTOSCOPY WITH URETEROSCOPY Left 05/09/2016   Procedure: CYSTOSCOPY, RETROGRADE, LEFT URETEROSCOPY, LEFT URETERAL BIOPSIES, HOLMIUM LASER FULGERATION, LEFT URETERAL STENT PLACEMENT;  Surgeon: Irine Seal, MD;  Location: [facility];  Service: Urology;  Laterality: Left;  . CYSTOSCOPY/URETEROSCOPY/HOLMIUM LASER Left 09/20/2016   Procedure: CYSTOSCOPY/URETEROSCOPY/HOLMIUM LASER;  Surgeon: Cleon Gustin, MD;  [facility];  Service: Urology;  Laterality: Left;  . HOLMIUM LASER APPLICATION Right 0/09/6576   Procedure: HOLMIUM LASER APPLICATION;  Surgeon: [name], MD;  Location: [facility];  Service: Urology;  Laterality: Right;  . HOLMIUM LASER APPLICATION Left 05/08/9627   Procedure: TUMOR ABLATION WITH HOLMIUM LASER;  Surgeon: Cleon Gustin, MD;  [facility]: [facility];  Service: Urology;  Laterality: Left;  . LUMBAR DISC SURGERY  1970's  . RENAL ANGIOGRAM N/A 11/02/2012   Procedure: RENAL ANGIOGRAM;  Surgeon: Lorretta Harp, MD;  Location: [facility] CATH LAB;  Service: Cardiovascular;  Laterality: N/A;  . RENAL ARTERY STENT Left 11/02/2012  . SKIN CANCER EXCISION Right 2017   temple  . SOFT TISSUE CYST EXCISION  1960's   "outside part of lung LLL"    Home Medications:  Current Facility-Administered Medications  Medication Dose Route Frequency Provider Last Rate Last Dose  . ceFAZolin (ANCEF) IVPB 2g/100 mL premix  2 g Intravenous 30 min Pre-Op Sigfredo Schreier, Candee Furbish, MD      . lactated ringers infusion   Intravenous Continuous Roberts Gaudy, MD 75 mL/hr at 05/16/17 1035     Allergies:  Allergies  Allergen Reactions  . Pneumococcal Vaccines Swelling and Other (See Comments)    Lip swelling    Family History  Problem Relation Age of Onset  . Hypertension Mother   . Cancer Father   . Diabetes Brother   . Hypertension Son    Social  History:  reports that he quit smoking about 6 years ago. His smoking use included cigarettes. He has a 60.00 pack-year smoking history. He has never used smokeless tobacco. He reports that he does not drink alcohol or use drugs.  Review of Systems  All other systems reviewed and are negative.   Physical Exam:  Vital signs in last 24 hours: Temp:  [98 F (36.7 C)] 98 F (36.7 C) (04/15 0957) Pulse Rate:  [73] 73 (04/15 0957) Resp:  [20] 20 (04/15 0957) BP: (147)/(73) 147/73 (04/15 0957) SpO2:  [94 %] 94 % (04/15 0957) Weight:  [67.3 kg (148 lb 6.4 oz)] 67.3 kg (148 lb 6.4 oz) (04/15 1029) Physical Exam  Constitutional: He is oriented to person, place, and time. He appears well-developed and well-nourished.  HENT:  Head: Normocephalic and atraumatic.  Eyes: Pupils are equal, round, and reactive to light. EOM are normal.  Neck: Normal range of motion. No thyromegaly present.  Cardiovascular: Normal rate and regular rhythm.  Respiratory: Effort normal. No respiratory distress.  GI: Soft. He exhibits no distension.  Musculoskeletal: Normal  range of motion. He exhibits no edema.  Neurological: He is alert and oriented to person, place, and time.  Skin: Skin is warm and dry.  Psychiatric: He has a normal mood and affect. His behavior is normal. Judgment and thought content normal.    Laboratory Data:  No results found for this or any previous visit (from the past 24 hour(s)). No results found for this or any previous visit (from the past 240 hour(s)). Creatinine: Recent Labs    05/12/17 1328  CREATININE 1.23   Baseline Creatinine: 1.2  Impression/Assessment:  84yo with a hx of left ureteral TCC  Plan:  The risks/benefits/alternatives to left diagnostic ureteroscopy and possible ablation of a ureteral tumor with stent placement was explained to the patient and he understands and wishes to proceed with surgery  Nicolette Bang 05/16/2017, 10:56 AM

## 2017-05-16 NOTE — Anesthesia Postprocedure Evaluation (Signed)
Anesthesia Post Note  Patient: Douglas Monroe  Procedure(s) Performed: CYSTOSCOPY WITH RETROGRADE PYELOGRAM, URETEROSCOPY WITH ABLATION OF URETERAL TUMOR, BLADDER BIOPSY AND FULGURATION, AND STENT PLACEMENT (Left )     Patient location during evaluation: PACU Anesthesia Type: General Level of consciousness: awake and alert Pain management: pain level controlled Vital Signs Assessment: post-procedure vital signs reviewed and stable Respiratory status: spontaneous breathing, nonlabored ventilation, respiratory function stable and patient connected to nasal cannula oxygen Cardiovascular status: blood pressure returned to baseline and stable Postop Assessment: no apparent nausea or vomiting Anesthetic complications: no    Last Vitals:  Vitals:   05/16/17 1330 05/16/17 1345  BP:  137/68  Pulse:  (!) 56  Resp:  12  Temp: (!) 36.4 C (!) 36.4 C  SpO2:  98%    Last Pain:  Vitals:   05/16/17 1345  TempSrc:   PainSc: 0-No pain                 Dinita Migliaccio COKER

## 2017-05-16 NOTE — Brief Op Note (Signed)
05/16/2017  12:27 PM  PATIENT:  Douglas Monroe  84 y.o. male  PRE-OPERATIVE DIAGNOSIS:  LEFT URETERAL TRANSITIONAL CELL CARCINOMA  POST-OPERATIVE DIAGNOSIS:  LEFT URETERAL TRANSITIONAL CELL CARCINOMA, bladder tumor  PROCEDURE:  Procedure(s): CYSTOSCOPY WITH RETROGRADE PYELOGRAM, URETEROSCOPY WITH ABLATION OF URETERAL TUMOR, BLADDER BIOPSY AND FULGURATION, AND STENT PLACEMENT (Left)  SURGEON:  Surgeon(s) and Role:    * Aissata Wilmore, Candee Furbish, MD - Primary  PHYSICIAN ASSISTANT:   ASSISTANTS: none   ANESTHESIA:   general  EBL:  50 mL   BLOOD ADMINISTERED:none  DRAINS: left 6x26 JJ ureteral stent   LOCAL MEDICATIONS USED:  NONE  SPECIMEN:  Source of Specimen:  bladder tumor  DISPOSITION OF SPECIMEN:  PATHOLOGY  COUNTS:  YES  TOURNIQUET:  * No tourniquets in log *  DICTATION: .Note written in EPIC  PLAN OF CARE: Discharge to home after PACU  PATIENT DISPOSITION:  PACU - hemodynamically stable.   Delay start of Pharmacological VTE agent (>24hrs) due to surgical blood loss or risk of bleeding: yes

## 2017-05-16 NOTE — Anesthesia Preprocedure Evaluation (Signed)
Anesthesia Evaluation  Patient identified by MRN, date of birth, ID band Patient awake    Reviewed: Allergy & Precautions, NPO status , Patient's Chart, lab work & pertinent test results  Airway Mallampati: II  TM Distance: >3 FB Neck ROM: Full    Dental  (+) Edentulous Upper, Edentulous Lower   Pulmonary former smoker,    breath sounds clear to auscultation       Cardiovascular hypertension,  Rhythm:Regular Rate:Normal     Neuro/Psych    GI/Hepatic   Endo/Other    Renal/GU      Musculoskeletal   Abdominal   Peds  Hematology   Anesthesia Other Findings   Reproductive/Obstetrics                             Anesthesia Physical Anesthesia Plan  ASA: III  Anesthesia Plan: General   Post-op Pain Management:    Induction: Intravenous  PONV Risk Score and Plan: Ondansetron and Dexamethasone  Airway Management Planned: LMA  Additional Equipment:   Intra-op Plan:   Post-operative Plan:   Informed Consent: I have reviewed the patients History and Physical, chart, labs and discussed the procedure including the risks, benefits and alternatives for the proposed anesthesia with the patient or authorized representative who has indicated his/her understanding and acceptance.     Plan Discussed with: CRNA and Anesthesiologist  Anesthesia Plan Comments:         Anesthesia Quick Evaluation

## 2017-05-16 NOTE — H&P (View-Only) (Signed)
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 He is here for surveillance cystoscopy. No new LUTS. No fevers/chills/sweats. No hematuria or dysuria.  Past Medical History:  Diagnosis Date  . Abdominal aortic aneurysm ([place])    infrarenal -- monitored by dr berry (note states stable 4.0 to 4.1cm)  . Anxiety    situational  . Arthritis    "hands" (03/15/2016)  . Cancer of skin of temple 2017   right  . COPD with emphysema ([place])   . Dyspnea    with exertion  . Exertional dyspnea   . First degree heart block   . GERD (gastroesophageal reflux disease)   . Headache(784.0)    uses goody powder; "maybe weekly, maybe not that much" (03/15/2016)  . History of kidney stones   . HOH (hard of hearing)    left ear; no hearing aid   . HTN (hypertension) 08/20/2011  . Hyperlipidemia   . Hypertension   . Ischemic colitis ([place])   . Left carotid artery stenosis    moderate  . Migraine    "long time ago" (03/15/2016)  . 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 PVD (peripheral vascular disease) with claudication [facility]) cardiologist-  dr berry   s/p bilateral iliac stents and right sfa stenting/  doppper study 09-05-2012  revealed ABIs close to one bilaterally with patent stents/  stenting left renal artery stenosis   . Right ureteral stone   . S/P arterial stent    left renal angioplasty and stent 11-02-2012  . Sleep apnea    at risk for OSA. stop/bang score= 6; sent to PCP 10/04/13; denies use of mask on 03/15/2016  . Tubular adenoma of colon   . Urticaria    Past Surgical History:  Procedure Laterality Date  . ABDOMINAL ANGIOGRAM  08/19/2011   Left common iliac artery at the ostium, 6x19mm ICAST covered stent, common femoral artery, 8x129mm Abbott absolute pro nitinol self-expanding stent and resulting in reduction of 80 and 90% stenosis to 0% residual  .  ABDOMINAL ANGIOGRAM  10/08/2010   Mid-right SFA, 6x120 EV3 Protege nitinol self-expanding stent, resulting in reduction of CTO to 0% residual  . ABDOMINAL ANGIOGRAM  06/22/2010   Rt Common Femoral Artery-8x3 Absolute Pro Nitinol self-expanding stent-90% stenosis to 0% residual; Rt External Iliac Artery-9x3 Absolute Pro Nitinol self-expanding stent-70% stenosis to 0% residual; Left External Iliac Artery-8x4 Absolute Pro Nitinol self-expanding stent-90% to 0% residual  . ATHERECTOMY N/A 08/19/2011   Procedure: ATHERECTOMY;  Surgeon: Lorretta Harp, MD;  Location: [facility] CATH LAB;  Service: Cardiovascular;  Laterality: N/A;  . BACK SURGERY    . CARDIAC CATHETERIZATION  10/26/2002  dr Tami Ribas   normal coronary arteries/ normal  LVF  . CARDIOVASCULAR STRESS TEST  06-12-2010   dr berry   normal perfusion study/ no ischemia/  ef 69%  . COLONOSCOPY WITH PROPOFOL N/A 03/19/2016   Procedure: COLONOSCOPY WITH PROPOFOL;  Surgeon: Mauri Pole, MD;  Location: [facility];  Service: Endoscopy;  Laterality: N/A;  was inpatient, but got d/c'd home and is coming back as an OP  . CYSTOSCOPY W/ URETERAL STENT PLACEMENT Left 06/07/2016   Procedure: CYSTOSCOPY WITH RETROGRADE PYELOGRAM/URETERAL STENT REPLACEMENT;  Surgeon: Cleon Gustin, MD;  [facility];  Service: Urology;  Laterality: Left;  . CYSTOSCOPY W/ URETERAL STENT PLACEMENT Left 09/20/2016   Procedure:  CYSTOSCOPY WITH RETROGRADE PYELOGRAM/URETERAL STENT PLACEMENT;  Surgeon: Cleon Gustin, MD;  Location[facility];  Service: Urology;  Laterality: Left;  . CYSTOSCOPY WITH RETROGRADE PYELOGRAM, URETEROSCOPY AND STENT PLACEMENT Right 10/10/2013   Procedure: uretheral dilation, CYSTOSCOPY, URETEROSCOPY, stone extraction, STENT PLACEMENT;  Surgeon: [name], MD;  Location: [facility];  Service: Urology;  Laterality: Right;  . CYSTOSCOPY WITH RETROGRADE PYELOGRAM, URETEROSCOPY AND STENT PLACEMENT Left 02/08/2017   Procedure: CYSTOSCOPY  WITH RETROGRADE PYELOGRAM, URETEROSCOPY;  Surgeon: Cleon Gustin, MD;  Location[facility];  Service: Urology;  Laterality: Left;  . CYSTOSCOPY WITH URETEROSCOPY Left 05/09/2016   Procedure: CYSTOSCOPY, RETROGRADE, LEFT URETEROSCOPY, LEFT URETERAL BIOPSIES, HOLMIUM LASER FULGERATION, LEFT URETERAL STENT PLACEMENT;  Surgeon: Irine Seal, MD;  Location[facility];  Service: Urology;  Laterality: Left;  . CYSTOSCOPY/URETEROSCOPY/HOLMIUM LASER Left 09/20/2016   Procedure: CYSTOSCOPY/URETEROSCOPY/HOLMIUM LASER;  Surgeon: Cleon Gustin, MD;  [facility];  Service: Urology;  Laterality: Left;  . HOLMIUM LASER APPLICATION Right 10/10/2424   Procedure: HOLMIUM LASER APPLICATION;  Surgeon: [name], MD;  Location: [facility];  Service: Urology;  Laterality: Right;  . HOLMIUM LASER APPLICATION Left 09/04/4194   Procedure: TUMOR ABLATION WITH HOLMIUM LASER;  Surgeon: Cleon Gustin, MD;  [facility];  Service: Urology;  Laterality: Left;  . LUMBAR DISC SURGERY  1970's  . RENAL ANGIOGRAM N/A 11/02/2012   Procedure: RENAL ANGIOGRAM;  Surgeon: Lorretta Harp, MD;  Location: [facility] CATH LAB;  Service: Cardiovascular;  Laterality: N/A;  . RENAL ARTERY STENT Left 11/02/2012  . SKIN CANCER EXCISION Right 2017   temple  . SOFT TISSUE CYST EXCISION  1960's   "outside part of lung LLL"    Home Medications:  Current Facility-Administered Medications  Medication Dose Route Frequency Provider Last Rate Last Dose  . ceFAZolin (ANCEF) IVPB 2g/100 mL premix  2 g Intravenous 30 min Pre-Op Devaunte Gasparini, Candee Furbish, MD      . lactated ringers infusion   Intravenous Continuous Roberts Gaudy, MD 75 mL/hr at 05/16/17 1035     Allergies:  Allergies  Allergen Reactions  . Pneumococcal Vaccines Swelling and Other (See Comments)    Lip swelling    Family History  Problem Relation Age of Onset  . Hypertension Mother   . Cancer Father   . Diabetes Brother   . Hypertension Son    Social  History:  reports that he quit smoking about 6 years ago. His smoking use included cigarettes. He has a 60.00 pack-year smoking history. He has never used smokeless tobacco. He reports that he does not drink alcohol or use drugs.  Review of Systems  All other systems reviewed and are negative.   Physical Exam:  Vital signs in last 24 hours: Temp:  [98 F (36.7 C)] 98 F (36.7 C) (04/15 0957) Pulse Rate:  [73] 73 (04/15 0957) Resp:  [20] 20 (04/15 0957) BP: (147)/(73) 147/73 (04/15 0957) SpO2:  [94 %] 94 % (04/15 0957) Weight:  [67.3 kg (148 lb 6.4 oz)] 67.3 kg (148 lb 6.4 oz) (04/15 1029) Physical Exam  Constitutional: He is oriented to person, place, and time. He appears well-developed and well-nourished.  HENT:  Head: Normocephalic and atraumatic.  Eyes: Pupils are equal, round, and reactive to light. EOM are normal.  Neck: Normal range of motion. No thyromegaly present.  Cardiovascular: Normal rate and regular rhythm.  Respiratory: Effort normal. No respiratory distress.  GI: Soft. He exhibits no distension.  Musculoskeletal: Normal  range of motion. He exhibits no edema.  Neurological: He is alert and oriented to person, place, and time.  Skin: Skin is warm and dry.  Psychiatric: He has a normal mood and affect. His behavior is normal. Judgment and thought content normal.    Laboratory Data:  No results found for this or any previous visit (from the past 24 hour(s)). No results found for this or any previous visit (from the past 240 hour(s)). Creatinine: Recent Labs    05/12/17 1328  CREATININE 1.23   Baseline Creatinine: 1.2  Impression/Assessment:  84yo with a hx of left ureteral TCC  Plan:  The risks/benefits/alternatives to left diagnostic ureteroscopy and possible ablation of a ureteral tumor with stent placement was explained to the patient and he understands and wishes to proceed with surgery  Nicolette Bang 05/16/2017, 10:56 AM

## 2017-05-16 NOTE — Transfer of Care (Signed)
Immediate Anesthesia Transfer of Care Note  Patient: Douglas Monroe  Procedure(s) Performed: CYSTOSCOPY WITH RETROGRADE PYELOGRAM, URETEROSCOPY WITH ABLATION OF URETERAL TUMOR, BLADDER BIOPSY AND FULGURATION, AND STENT PLACEMENT (Left )  Patient Location: PACU  Anesthesia Type:General  Level of Consciousness: alert  and patient cooperative  Airway & Oxygen Therapy: Patient connected to face mask oxygen  Post-op Assessment: Report given to RN and Post -op Vital signs reviewed and stable  Post vital signs: stable  Last Vitals:  Vitals Value Taken Time  BP    Temp    Pulse 73 05/16/2017 12:39 PM  Resp 26 05/16/2017 12:39 PM  SpO2 100 % 05/16/2017 12:39 PM  Vitals shown include unvalidated device data.  Last Pain:  Vitals:   05/16/17 0957  TempSrc: Oral         Complications: No apparent anesthesia complications

## 2017-05-16 NOTE — Discharge Instructions (Signed)

## 2017-05-17 ENCOUNTER — Telehealth: Payer: Self-pay | Admitting: Allergy & Immunology

## 2017-05-17 MED ORDER — EPINEPHRINE 0.3 MG/0.3ML IJ SOAJ: 0.3000 mg | Freq: Once | INTRAMUSCULAR | 1 refills | Status: AC

## 2017-05-17 NOTE — Telephone Encounter (Signed)
Noted. Thanks for taking care of that, Tam Tam!   Salvatore Marvel, MD [facility] [place] [facility]

## 2017-05-17 NOTE — Telephone Encounter (Signed)
Called and spoke to patient wife [name] to explain patient lab results.  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  I did advise her of avoidance of mammalian meat.  I inquired if they had epipen and they did not so I advised her I would send same in that he should have one on hand in case of life-threatening allergic reactions. Will send FYI to Dr Ernst Bowler in regards to me sending epipen

## 2017-05-17 NOTE — Telephone Encounter (Signed)
Patient was seen 05-05-17 and allergy testing was not done because he was on antihistamine. He had blood work done and his wife called and said they got a letter about this and that he was tested for Alpha Gal. She would like information on this.

## 2017-05-26 NOTE — Op Note (Addendum)
Preoperative diagnosis: left ureteral cancer  Postoperative diagnosis: Same  Procedure: 1 cystoscopy 2 left retrograde pyelography 3.  Intraoperative fluoroscopy, under one hour, with interpretation 4.  left ureteroscopic tumor ablation 5.  left 6 x 26 JJ stent placement 6. Bladder biopsy with fulgeration  Attending: Rosie Fate  Anesthesia: General  Estimated blood loss: None  Drains: left 6 x 26 JJ ureteral stent without tether  Specimens: bladder biopsy  Antibiotics: ancef  Findings: left distal ureteral papillary mass concerning for malignancy. No hydronephrosis. Ureteral orifices in normal anatomic location. 1cm left bladder neck papillary tumor  Indications: Patient is a 84 year old male with a history of left ureteral tumor s/p BCG.  After discussing treatment options, he decided proceed with left ureteroscopic tumor ablation.  Procedure in detail: The patient was brought to the operating room and a brief timeout was done to ensure correct patient, correct procedure, correct site.  General anesthesia was administered patient was placed in dorsal lithotomy position.  his genitalia was then prepped and draped in usual sterile fashion.  A rigid 67 French cystoscope was passed in the urethra and the bladder.  Bladder was inspected free masses or lesions.  the ureteral orifices were in the normal orthotopic locations. a 6 french ureteral catheter was then instilled into the left ureter orifice.  a gentle retrograde was obtained and findings noted above.  zipwire was advanced up to the renal pelvis through the ureteral catheter and the catheter was removed   we then removed the cystoscope and cannulated the left ureteral orifice with a semirigid ureteroscope.  We encountered an area in the distal ureter concerning for recurrent tumor. We used the ureteral bugbee to fulgerate the tumor. we then placed a 6 x 26 double-j ureteral stent over the original zip wire. We then  removed the wire and good coil was noted in the the renal pelvis under fluoroscopy and the bladder under direct vision. We then obtain a biopsy from the 1cm bladder tumor and then used the bugbee to fulgerate the lesion.  the bladder was then drained and this concluded the procedure which was well tolerated by patient.  Complications: None  Condition: Stable, extubated, transferred to PACU  Plan: Patient is to be discharged home as to follow-up in 2 weeks

## 2017-05-27 ENCOUNTER — Telehealth: Payer: Self-pay | Admitting: Allergy & Immunology

## 2017-05-27 NOTE — Telephone Encounter (Signed)
His wife called and said that he is taking 2 Zyrtec each day and 2 Ranitidine 300 mg.each day and they are making him sleepy. Needs to know what to do. [phone]

## 2017-05-27 NOTE — Telephone Encounter (Signed)
I spoke with wife today. She states that Douglas Monroe has had no problems with hives since the day before coming in to see you. He is currently taking cetirizine 10 mg in the morning and 10 mg in the evening. I advised wife to cut it down to 10 mg tab at night if it is making him that groggy through the day. I did let her know that in the event he begins having issues with hives again to step therapy back up to the cetirizine 10 2 tabs bid. Please advise if you have any further recommendations.

## 2017-05-27 NOTE — Telephone Encounter (Signed)
Noted. Agree with the plan.   Salvatore Marvel, MD [facility]

## 2017-05-30 ENCOUNTER — Telehealth: Payer: Self-pay | Admitting: Pulmonary Disease

## 2017-05-30 MED ORDER — BENZONATATE 100 MG PO CAPS: 100.0000 mg | ORAL_CAPSULE | Freq: Two times a day (BID) | ORAL | 0 refills | Status: DC | PRN

## 2017-05-30 MED ORDER — PREDNISONE 10 MG PO TABS: ORAL_TABLET | ORAL | 0 refills | Status: DC

## 2017-05-30 NOTE — Telephone Encounter (Signed)
Called and spoke with patient. Advised of prescriptions being sent in. Nothing further needed.

## 2017-05-30 NOTE — Telephone Encounter (Signed)
Call in tessalon 100 mg bid as needed and pred taper starting at 40 mg.  Reduce dose by 10 mg every 3 days.

## 2017-05-30 NOTE — Telephone Encounter (Signed)
Called and spoke with patients wife, she states that patient is starting to have coughing spells. He was taking robitussin but this is not helping. Please advise.

## 2017-05-30 NOTE — Addendum Note (Signed)
Addended by: Della Goo C on: 05/30/2017 04:58 PM   Modules accepted: Orders

## 2017-05-31 ENCOUNTER — Ambulatory Visit: Payer: Medicare Other | Admitting: Pulmonary Disease

## 2017-05-31 DIAGNOSIS — C661 Malignant neoplasm of right ureter: Secondary | ICD-10-CM | POA: Diagnosis not present

## 2017-06-01 ENCOUNTER — Ambulatory Visit: Payer: Medicare Other | Admitting: Pulmonary Disease

## 2017-06-02 ENCOUNTER — Other Ambulatory Visit: Payer: Self-pay | Admitting: Urology

## 2017-06-07 ENCOUNTER — Other Ambulatory Visit: Payer: Self-pay

## 2017-06-07 ENCOUNTER — Encounter (HOSPITAL_COMMUNITY): Payer: Self-pay | Admitting: *Deleted

## 2017-06-08 ENCOUNTER — Encounter: Payer: Self-pay | Admitting: Pulmonary Disease

## 2017-06-08 ENCOUNTER — Ambulatory Visit (INDEPENDENT_AMBULATORY_CARE_PROVIDER_SITE_OTHER): Payer: Medicare Other | Admitting: Pulmonary Disease

## 2017-06-08 VITALS — BP 124/70 | HR 63 | Ht 66.0 in | Wt 151.6 lb

## 2017-06-08 DIAGNOSIS — J441 Chronic obstructive pulmonary disease with (acute) exacerbation: Secondary | ICD-10-CM | POA: Diagnosis not present

## 2017-06-08 DIAGNOSIS — J439 Emphysema, unspecified: Secondary | ICD-10-CM

## 2017-06-08 NOTE — Progress Notes (Signed)
Douglas Monroe    [id number]    [birth year]  Primary Care Physician:Sanders, Bailey Mech, MD  Referring Physician: Glendale Chard, [address] [place], [place] [address]  Chief complaint:  Follow-up for COPD, lung nodule  HPI: 84 year old with history of COPD, hypertension, abdominal aortic aneurysm GERD, sleep apnea, renal artery stenosis,  peripheral vascular disease Seen in the ED on 10/21 for dyspnea, possible allergic reaction to albuterol.  He was taking albuterol every 2 hours for COPD exacerbation and reports feeling of throat closing up and rash on hands and genital area.  He took Benadryl and Pepcid at home with improvement He had been treated in the ED with with prednisone and ipratropium inhaler for his breathing.  He has been taken of albuterol and given Atrovent instead.  He is also on Symbicort which is started a month ago.  He reports good response to that He was on Spiriva but was taken off by his primary care at the time Symbicort was initiated.  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  He underwent urologic procedure for carcinoma of the ureter in December 2018.  He tolerated this well without any issue.  He had a CT scan which showed left lower lobe lung nodule.  Follow-up PET scan did not show any uptake or suspicious activity.  Occupation: Retired Games developer Exposures: No known exposures Smoking history: 90-pack-year smoking history.  Quit in 2012 Travel History: Not significant  Interim history: States that breathing is slightly worse since last office visit with dyspnea on exertion.  He called the office last week and had been placed on a prednisone taper.  He still has a few days of prednisone left.  Denies any ill in sputum, fevers, chills He has been recently diagnosed with alpha gal allergy  Outpatient Encounter Medications as of  06/08/2017  Medication Sig  . albuterol (PROVENTIL HFA;VENTOLIN HFA) 108 (90 Base) MCG/ACT inhaler Inhale 2 puffs into the lungs every 6 (six) hours as needed for wheezing or shortness of breath.  [name] albuterol (PROVENTIL) (2.5 MG/3ML) 0.083% nebulizer solution Take 2.5 mg by nebulization every 6 (six) hours as needed for wheezing or shortness of breath.  [name] amLODipine (NORVASC) 5 MG tablet Take 5 mg by mouth daily.   . Aspirin-Acetaminophen-Caffeine (GOODY HEADACHE PO) Take 1 packet by mouth daily as needed (for headache).  [name] atorvastatin (LIPITOR) 20 MG tablet TAKE 1 TABLET BY MOUTH  DAILY AT 6 PM. (Patient taking differently: TAKE 20 MG BY MOUTH DAILY)  . benzonatate (TESSALON) 100 MG capsule Take 1 capsule (100 mg total) by mouth 2 (two) times daily as needed for cough.  . BYSTOLIC 10 MG tablet Take 10 mg by mouth daily.  . cetirizine (ZYRTEC) 10 MG tablet Take 10 mg by mouth 2 (two) times daily.  . clopidogrel (PLAVIX) 75 MG tablet TAKE 1 TABLET BY MOUTH  EVERY DAY (Patient taking differently: TAKE 75 MG BY MOUTH  EVERY DAY)  . [employer] D3 2000 units CAPS Take 2,000 Units by mouth daily.   [name] EPINEPHrine 0.3 mg/0.3 mL IJ SOAJ injection Inject 0.3 mg into the muscle once.  . finasteride (PROSCAR) 5 MG tablet Take 5 mg by mouth daily.   . Fluticasone-Umeclidin-Vilant (TRELEGY ELLIPTA) 100-62.5-25 MCG/INH AEPB Inhale 1 puff into the lungs daily.  . pantoprazole (PROTONIX) 40 MG tablet Take 40 mg by mouth  daily.  . predniSONE (DELTASONE) 10 MG tablet Take 40mg  x 3 days then 30mg  x 3 days then 20mg  x 3 days then 10mg  x 2 days then stop (Patient taking differently: Take 10-40 mg by mouth See admin instructions. Take 40mg  x 3 days then 30mg  x 3 days then 20mg  x 3 days then 10mg  x 2 days then stop)  . ranitidine (ZANTAC) 150 MG tablet Take 150 mg by mouth 2 (two) times daily.   [name] Respiratory Therapy Supplies (FLUTTER) DEVI Use as directed  . tamsulosin (FLOMAX) 0.4 MG CAPS capsule Take 0.4 mg by mouth daily.     [name] HYDROcodone-acetaminophen (NORCO) 5-325 MG tablet Take 1 tablet by mouth every 4 (four) hours as needed for moderate pain. (Patient not taking: Reported on 06/08/2017)   No facility-administered encounter medications on file as of 06/08/2017.     Allergies as of 06/08/2017 - Review Complete 06/08/2017  Allergen Reaction Noted  . Pneumococcal vaccines Swelling and Other (See Comments) 10/04/2013    Past Medical History:  Diagnosis Date  . Abdominal aortic aneurysm ([employer])    infrarenal -- monitored by dr berry (note states stable 4.0 to 4.1cm)  . Anxiety    situational  . Arthritis    "hands" (03/15/2016)  . Cancer of skin of temple 2017   right  . COPD with emphysema ([place])   . Dyspnea    with exertion  . Exertional dyspnea   . First degree heart block   . GERD (gastroesophageal reflux disease)   . Headache(784.0)    uses goody powder; "maybe weekly, maybe not that much" (03/15/2016)  . History of kidney stones   . HOH (hard of hearing)    left ear; no hearing aid   . HTN (hypertension) 08/20/2011  . Hyperlipidemia   . Hypertension   . Ischemic colitis ([place])   . Left carotid artery stenosis    moderate  . Migraine    "long time ago" (03/15/2016)  . 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 PVD (peripheral vascular disease) with claudication [facility]) cardiologist-  dr berry   s/p bilateral iliac stents and right sfa stenting/  doppper study 09-05-2012  revealed ABIs close to one bilaterally with patent stents/  stenting left renal artery stenosis   . Right ureteral stone   . S/P arterial stent    left renal angioplasty and stent 11-02-2012  . Sleep apnea    at risk for OSA. stop/bang score= 6; sent to PCP 10/04/13; denies use of mask on 03/15/2016  . Tubular adenoma of colon   . Urticaria     Past Surgical History:  Procedure Laterality Date  . ABDOMINAL ANGIOGRAM  08/19/2011   Left common iliac artery at the ostium, 6x70mm ICAST covered stent, common  femoral artery, 8x125mm Abbott absolute pro nitinol self-expanding stent and resulting in reduction of 80 and 90% stenosis to 0% residual  . ABDOMINAL ANGIOGRAM  10/08/2010   Mid-right SFA, 6x120 EV3 Protege nitinol self-expanding stent, resulting in reduction of CTO to 0% residual  . ABDOMINAL ANGIOGRAM  06/22/2010   Rt Common Femoral Artery-8x3 Absolute Pro Nitinol self-expanding stent-90% stenosis to 0% residual; Rt External Iliac Artery-9x3 Absolute Pro Nitinol self-expanding stent-70% stenosis to 0% residual; Left External Iliac Artery-8x4 Absolute Pro Nitinol self-expanding stent-90% to 0% residual  . ATHERECTOMY N/A 08/19/2011   Procedure: ATHERECTOMY;  Surgeon: Lorretta Harp, MD;  Location: [facility] CATH LAB;  Service: Cardiovascular;  Laterality: N/A;  . BACK SURGERY    .  CARDIAC CATHETERIZATION  10/26/2002  dr Tami Ribas   normal coronary arteries/ normal  LVF  . CARDIOVASCULAR STRESS TEST  06-12-2010   dr berry   normal perfusion study/ no ischemia/  ef 69%  . COLONOSCOPY WITH PROPOFOL N/A 03/19/2016   Procedure: COLONOSCOPY WITH PROPOFOL;  Surgeon: Mauri Pole, MD;  Location: [facility];  Service: Endoscopy;  Laterality: N/A;  was inpatient, but got d/c'd home and is coming back as an OP  . CYSTOSCOPY W/ URETERAL STENT PLACEMENT Left 06/07/2016   Procedure: CYSTOSCOPY WITH RETROGRADE PYELOGRAM/URETERAL STENT REPLACEMENT;  Surgeon: Cleon Gustin, MD;  Location: [facility];  Service: Urology;  Laterality: Left;  . CYSTOSCOPY W/ URETERAL STENT PLACEMENT Left 09/20/2016   Procedure: CYSTOSCOPY WITH RETROGRADE PYELOGRAM/URETERAL STENT PLACEMENT;  Surgeon: Cleon Gustin, MD;  Location: [facility];  Service: Urology;  Laterality: Left;  . CYSTOSCOPY WITH RETROGRADE PYELOGRAM, URETEROSCOPY AND STENT PLACEMENT Right 10/10/2013   Procedure: uretheral dilation, CYSTOSCOPY, URETEROSCOPY, stone extraction, STENT PLACEMENT;  Surgeon: [name], MD;  Location: [facility];  Service:  Urology;  Laterality: Right;  . CYSTOSCOPY WITH RETROGRADE PYELOGRAM, URETEROSCOPY AND STENT PLACEMENT Left 02/08/2017   Procedure: CYSTOSCOPY WITH RETROGRADE PYELOGRAM, URETEROSCOPY;  Surgeon: Cleon Gustin, MD;  Location: [facility];  Service: Urology;  Laterality: Left;  . CYSTOSCOPY WITH RETROGRADE PYELOGRAM, URETEROSCOPY AND STENT PLACEMENT Left 05/16/2017   Procedure: CYSTOSCOPY WITH RETROGRADE PYELOGRAM, URETEROSCOPY WITH ABLATION OF URETERAL TUMOR, BLADDER BIOPSY AND FULGURATION, AND STENT PLACEMENT;  Surgeon: Cleon Gustin, MD;  Location: [facility];  Service: Urology;  Laterality: Left;  . CYSTOSCOPY WITH URETEROSCOPY Left 05/09/2016   Procedure: CYSTOSCOPY, RETROGRADE, LEFT URETEROSCOPY, LEFT URETERAL BIOPSIES, HOLMIUM LASER FULGERATION, LEFT URETERAL STENT PLACEMENT;  Surgeon: Irine Seal, MD;  Location: [facility];  Service: Urology;  Laterality: Left;  . CYSTOSCOPY/URETEROSCOPY/HOLMIUM LASER Left 09/20/2016   Procedure: CYSTOSCOPY/URETEROSCOPY/HOLMIUM LASER;  Surgeon: Cleon Gustin, MD;  Location: [facility];  Service: Urology;  Laterality: Left;  . HOLMIUM LASER APPLICATION Right 10/09/9890   Procedure: HOLMIUM LASER APPLICATION;  Surgeon: [name], MD;  Location: [facility];  Service: Urology;  Laterality: Right;  . HOLMIUM LASER APPLICATION Left 02/02/9415   Procedure: TUMOR ABLATION WITH HOLMIUM LASER;  Surgeon: Cleon Gustin, MD;  Location: [facility];  Service: Urology;  Laterality: Left;  . LUMBAR DISC SURGERY  1970's  . RENAL ANGIOGRAM N/A 11/02/2012   Procedure: RENAL ANGIOGRAM;  Surgeon: Lorretta Harp, MD;  Location[facility] CATH LAB;  Service: Cardiovascular;  Laterality: N/A;  . RENAL ARTERY STENT Left 11/02/2012  . SKIN CANCER EXCISION Right 2017   temple  . SOFT TISSUE CYST EXCISION  1960's   "outside part of lung LLL"    Family History  Problem Relation Age of Onset  . Hypertension Mother   . Cancer Father   . Diabetes Brother   . Hypertension Son       Social History   Socioeconomic History  . Marital status: Married    Spouse name: Not on file  . Number of children: Not on file  . Years of education: Not on file  . Highest education level: Not on file  Occupational History  . Not on file  Social Needs  . Financial resource strain: Not on file  . Food insecurity:    Worry: Not on file    Inability: Not on file  . Transportation needs:    Medical: Not on file    Non-medical: Not  on file  Tobacco Use  . Smoking status: Former Smoker    Packs/day: 1.00    Years: 60.00    Pack years: 60.00    Types: Cigarettes    Last attempt to quit: 06/02/2010    Years since quitting: 7.0  . Smokeless tobacco: Never Used  Substance and Sexual Activity  . Alcohol use: No  . Drug use: No  . Sexual activity: Not Currently  Lifestyle  . Physical activity:    Days per week: Not on file    Minutes per session: Not on file  . Stress: Not on file  Relationships  . Social connections:    Talks on phone: Not on file    Gets together: Not on file    Attends religious service: Not on file    Active member of club or organization: Not on file    Attends meetings of clubs or organizations: Not on file    Relationship status: Not on file  . Intimate partner violence:    Fear of current or ex partner: Not on file    Emotionally abused: Not on file    Physically abused: Not on file    Forced sexual activity: Not on file  Other Topics Concern  . Not on file  Social History Narrative  . Not on file    Review of systems: Review of Systems  Constitutional: Negative for fever and chills.  HENT: Negative.   Eyes: Negative for blurred vision.  Respiratory: as per HPI  Cardiovascular: Negative for chest pain and palpitations.  Gastrointestinal: Negative for vomiting, diarrhea, blood per rectum. Genitourinary: Negative for dysuria, urgency, frequency and hematuria.  Musculoskeletal: Negative for myalgias, back pain and joint pain.  Skin:  Negative for itching and rash.  Neurological: Negative for dizziness, tremors, focal weakness, seizures and loss of consciousness.  Endo/Heme/Allergies: Negative for environmental allergies.  Psychiatric/Behavioral: Negative for depression, suicidal ideas and hallucinations.  All other systems reviewed and are negative.  Physical Exam: Blood pressure 124/70, pulse 63, height 5\' 6"  (1.676 m), weight 151 lb 9.6 oz (68.8 kg), SpO2 94 %. Gen:      No acute distress HEENT:  EOMI, sclera anicteric Neck:     No masses; no thyromegaly Lungs:    Clear to auscultation bilaterally; normal respiratory effort CV:         Regular rate and rhythm; no murmurs Abd:      + bowel sounds; soft, non-tender; no palpable masses, no distension Ext:    No edema; adequate peripheral perfusion Skin:      Warm and dry; no rash Neuro: alert and oriented x 3 Psych: normal mood and affect  Data Reviewed: CT abdomen pelvis 05/18/16-lung images show scarring in the left base, 6.8 mm nodule which is new compared to prior. Chest x-ray 11/21/16-hyperinflation, chronic bronchitis.  Blunting of the left costophrenic angle.  No acute infiltrate or consolidation CT chest 12/13/16-moderate emphysema, 1.5 cm left lower lobe nodule, mild bronchiectasis, tree-in-bud in lingula and left lower lobe. PET scan 02/02/17-peribronchial thickening, tree-in-bud nodularity in the left lower lobe.  Left lower lobe nodule is not metabolic. I have reviewed all images personally.  FENO 12/03/16- 18  PFTs 02/22/17 FVC 1.89 [57%], FEV1 0.93 [40%], F/F 49, TLC 105%, RV/TLC 162%, DLCO 47% Severe obstruction with bronchodilator response, severe diffusion defect.  Air-trapping.  Assessment:  Severe COPD Continues on Trelegy, albuterol inhaler and nebulizer He is on prednisone taper for minor exacerbation.  There is no wheezing in the  office today and I do not believe he will require additional prednisone. Continue Mucinex and flutter valve for  clearance of secretion  Abnormal CT scan CT scan noted with chronic left base scarring.  He reports thoracotomy at the age of 71 to remove "lung cyst" from left lung base.   1.5 cm left lower lobe lung nodule is not FDG avid and likely benign.  Plan/Recommendations: -Continue Trelegy, albuterol -Finish prednisone taper  Marshell Garfinkel MD [facility] Pager [phone] 06/08/2017, 12:24 PM  CC: Glendale Chard, MD

## 2017-06-08 NOTE — Patient Instructions (Signed)
The lungs sound clear today so I do not feel you need additional prednisone Finish the prednisone that you have already been prescribed Continue the Tessalon.  Use Mucinex over-the-counter for congestion We will check your oxygen levels on exertion today Follow-up in 6 months

## 2017-06-09 ENCOUNTER — Telehealth: Payer: Self-pay | Admitting: Pulmonary Disease

## 2017-06-09 NOTE — Telephone Encounter (Signed)
Attempted to contact pt's wife.  Received busy signal x3. Will try back.

## 2017-06-10 ENCOUNTER — Telehealth: Payer: Self-pay | Admitting: Pulmonary Disease

## 2017-06-10 MED ORDER — AZITHROMYCIN 250 MG PO TABS: 250.0000 mg | ORAL_TABLET | Freq: Every day | ORAL | 0 refills | Status: DC

## 2017-06-10 NOTE — Telephone Encounter (Signed)
Please call in z pack 

## 2017-06-10 NOTE — Telephone Encounter (Signed)
[facility] [place]. Nothing further is needed.   Verified Rx sent in.

## 2017-06-10 NOTE — Telephone Encounter (Signed)
Pt wife is requesting a call when the Rx has been resent. Cb is [phone].

## 2017-06-10 NOTE — Telephone Encounter (Signed)
Sent zpak script to pt's preferred pharmacy.  Called pt's spouse [name] letting her know the script was sent in.  [name] expressed understanding. Nothing further needed at this time.

## 2017-06-10 NOTE — Telephone Encounter (Signed)
Called and spoke with pt's wife [name] who states she believes pt needs an antibiotic.  Pt was running a low grade temp yesterday of 99.5, a lot of postnasal drainage, and has pain in the sinuses.  [name] stated symptoms had begun after OV with Dr. Vaughan Browner 5/8 but just became worse. Pt also has complaints of a cough.  Dr. Vaughan Browner, please advise on the above for pt. Thanks!

## 2017-06-13 ENCOUNTER — Encounter (HOSPITAL_COMMUNITY): Payer: Self-pay | Admitting: *Deleted

## 2017-06-13 ENCOUNTER — Other Ambulatory Visit: Payer: Self-pay

## 2017-06-13 ENCOUNTER — Ambulatory Visit (HOSPITAL_COMMUNITY): Payer: Medicare Other | Admitting: Registered Nurse

## 2017-06-13 ENCOUNTER — Encounter (HOSPITAL_COMMUNITY)
Admission: RE | Disposition: A | Discharge status: Home / Self Care | Payer: Self-pay | Source: Ambulatory Visit | Attending: Urology

## 2017-06-13 ENCOUNTER — Ambulatory Visit (HOSPITAL_COMMUNITY): Payer: Medicare Other

## 2017-06-13 ENCOUNTER — Ambulatory Visit (HOSPITAL_COMMUNITY)
Admission: RE | Admit: 2017-06-13 | Discharge: 2017-06-13 | Disposition: A | Discharge status: Home / Self Care | Payer: Medicare Other | Source: Ambulatory Visit | Attending: Urology | Admitting: Urology

## 2017-06-13 DIAGNOSIS — Z7951 Long term (current) use of inhaled steroids: Secondary | ICD-10-CM | POA: Insufficient documentation

## 2017-06-13 DIAGNOSIS — D4122 Neoplasm of uncertain behavior of left ureter: Secondary | ICD-10-CM | POA: Diagnosis not present

## 2017-06-13 DIAGNOSIS — K219 Gastro-esophageal reflux disease without esophagitis: Secondary | ICD-10-CM | POA: Diagnosis not present

## 2017-06-13 DIAGNOSIS — I714 Abdominal aortic aneurysm, without rupture: Secondary | ICD-10-CM | POA: Diagnosis not present

## 2017-06-13 DIAGNOSIS — I739 Peripheral vascular disease, unspecified: Secondary | ICD-10-CM | POA: Insufficient documentation

## 2017-06-13 DIAGNOSIS — Z87891 Personal history of nicotine dependence: Secondary | ICD-10-CM | POA: Insufficient documentation

## 2017-06-13 DIAGNOSIS — C662 Malignant neoplasm of left ureter: Secondary | ICD-10-CM | POA: Diagnosis not present

## 2017-06-13 DIAGNOSIS — J439 Emphysema, unspecified: Secondary | ICD-10-CM | POA: Insufficient documentation

## 2017-06-13 DIAGNOSIS — Z7952 Long term (current) use of systemic steroids: Secondary | ICD-10-CM | POA: Diagnosis not present

## 2017-06-13 DIAGNOSIS — Z79899 Other long term (current) drug therapy: Secondary | ICD-10-CM | POA: Insufficient documentation

## 2017-06-13 DIAGNOSIS — E1151 Type 2 diabetes mellitus with diabetic peripheral angiopathy without gangrene: Secondary | ICD-10-CM | POA: Diagnosis not present

## 2017-06-13 DIAGNOSIS — Z7902 Long term (current) use of antithrombotics/antiplatelets: Secondary | ICD-10-CM | POA: Insufficient documentation

## 2017-06-13 DIAGNOSIS — Z85828 Personal history of other malignant neoplasm of skin: Secondary | ICD-10-CM | POA: Insufficient documentation

## 2017-06-13 DIAGNOSIS — I10 Essential (primary) hypertension: Secondary | ICD-10-CM | POA: Diagnosis not present

## 2017-06-13 DIAGNOSIS — R51 Headache: Secondary | ICD-10-CM | POA: Diagnosis not present

## 2017-06-13 DIAGNOSIS — E785 Hyperlipidemia, unspecified: Secondary | ICD-10-CM | POA: Insufficient documentation

## 2017-06-13 DIAGNOSIS — G4730 Sleep apnea, unspecified: Secondary | ICD-10-CM | POA: Insufficient documentation

## 2017-06-13 DIAGNOSIS — N1330 Unspecified hydronephrosis: Secondary | ICD-10-CM

## 2017-06-13 HISTORY — PX: TRANSURETHRAL RESECTION OF BLADDER TUMOR: SHX2575

## 2017-06-13 HISTORY — PX: CYSTOSCOPY WITH RETROGRADE PYELOGRAM, URETEROSCOPY AND STENT PLACEMENT: SHX5789

## 2017-06-13 LAB — CBC
HCT: 42.4 % (ref 39.0–52.0)
HEMOGLOBIN: 14 g/dL (ref 13.0–17.0)
MCH: 30.2 pg (ref 26.0–34.0)
MCHC: 33 g/dL (ref 30.0–36.0)
MCV: 91.6 fL (ref 78.0–100.0)
Platelets: 159 10*3/uL (ref 150–400)
RBC: 4.63 MIL/uL (ref 4.22–5.81)
RDW: 14.5 % (ref 11.5–15.5)
WBC: 9.9 10*3/uL (ref 4.0–10.5)

## 2017-06-13 LAB — BASIC METABOLIC PANEL
ANION GAP: 11 (ref 5–15)
BUN: 23 mg/dL — ABNORMAL HIGH (ref 6–20)
CALCIUM: 8.8 mg/dL — AB (ref 8.9–10.3)
CO2: 22 mmol/L (ref 22–32)
Chloride: 107 mmol/L (ref 101–111)
Creatinine, Ser: 1.25 mg/dL — ABNORMAL HIGH (ref 0.61–1.24)
GFR calc non Af Amer: 52 mL/min — ABNORMAL LOW (ref >60)
Glucose, Bld: 106 mg/dL — ABNORMAL HIGH (ref 65–99)
Potassium: 3.7 mmol/L (ref 3.5–5.1)
Sodium: 140 mmol/L (ref 135–145)

## 2017-06-13 SURGERY — CYSTOURETEROSCOPY, WITH RETROGRADE PYELOGRAM AND STENT INSERTION
Anesthesia: General

## 2017-06-13 MED ORDER — ALBUTEROL SULFATE (2.5 MG/3ML) 0.083% IN NEBU: 3.0000 mL | INHALATION_SOLUTION | Freq: Once | RESPIRATORY_TRACT | Status: DC

## 2017-06-13 MED ORDER — DEXAMETHASONE SODIUM PHOSPHATE 10 MG/ML IJ SOLN
INTRAMUSCULAR | Status: DC | PRN
  Administered 2017-06-13: 10 mg via INTRAVENOUS

## 2017-06-13 MED ORDER — ALBUTEROL SULFATE HFA 108 (90 BASE) MCG/ACT IN AERS
INHALATION_SPRAY | RESPIRATORY_TRACT | Status: AC
  Administered 2017-06-13: 2
  Filled 2017-06-13: qty 6.7

## 2017-06-13 MED ORDER — DEXAMETHASONE SODIUM PHOSPHATE 10 MG/ML IJ SOLN
INTRAMUSCULAR | Status: AC
  Filled 2017-06-13: qty 1

## 2017-06-13 MED ORDER — ONDANSETRON HCL 4 MG/2ML IJ SOLN: 4.0000 mg | Freq: Once | INTRAMUSCULAR | Status: DC | PRN

## 2017-06-13 MED ORDER — FENTANYL CITRATE (PF) 100 MCG/2ML IJ SOLN
INTRAMUSCULAR | Status: DC | PRN
  Administered 2017-06-13 (×2): 25 ug via INTRAVENOUS

## 2017-06-13 MED ORDER — NEBIVOLOL HCL 10 MG PO TABS
10.0000 mg | ORAL_TABLET | Freq: Every day | ORAL | Status: DC
  Administered 2017-06-13: 10 mg via ORAL
  Filled 2017-06-13: qty 1

## 2017-06-13 MED ORDER — EPHEDRINE 5 MG/ML INJ
INTRAVENOUS | Status: AC
  Filled 2017-06-13: qty 10

## 2017-06-13 MED ORDER — LIDOCAINE 2% (20 MG/ML) 5 ML SYRINGE
INTRAMUSCULAR | Status: AC
  Filled 2017-06-13: qty 5

## 2017-06-13 MED ORDER — FENTANYL CITRATE (PF) 100 MCG/2ML IJ SOLN
INTRAMUSCULAR | Status: AC
  Filled 2017-06-13: qty 2

## 2017-06-13 MED ORDER — HYDROCODONE-ACETAMINOPHEN 5-325 MG PO TABS: 1.0000 | ORAL_TABLET | ORAL | 0 refills | Status: DC | PRN

## 2017-06-13 MED ORDER — ONDANSETRON HCL 4 MG/2ML IJ SOLN
INTRAMUSCULAR | Status: DC | PRN
  Administered 2017-06-13: 4 mg via INTRAVENOUS

## 2017-06-13 MED ORDER — MEPERIDINE HCL 50 MG/ML IJ SOLN: 6.2500 mg | INTRAMUSCULAR | Status: DC | PRN

## 2017-06-13 MED ORDER — IOHEXOL 300 MG/ML  SOLN
INTRAMUSCULAR | Status: DC | PRN
  Administered 2017-06-13: 7 mL via URETHRAL

## 2017-06-13 MED ORDER — ONDANSETRON HCL 4 MG/2ML IJ SOLN
INTRAMUSCULAR | Status: AC
  Filled 2017-06-13: qty 2

## 2017-06-13 MED ORDER — SODIUM CHLORIDE 0.9 % IV SOLN
2.0000 g | INTRAVENOUS | Status: AC
  Administered 2017-06-13: 2 g via INTRAVENOUS
  Filled 2017-06-13: qty 20

## 2017-06-13 MED ORDER — LIDOCAINE 2% (20 MG/ML) 5 ML SYRINGE
INTRAMUSCULAR | Status: DC | PRN
  Administered 2017-06-13: 80 mg via INTRAVENOUS

## 2017-06-13 MED ORDER — FENTANYL CITRATE (PF) 100 MCG/2ML IJ SOLN: 25.0000 ug | INTRAMUSCULAR | Status: DC | PRN

## 2017-06-13 MED ORDER — STERILE WATER FOR IRRIGATION IR SOLN
Status: DC | PRN
  Administered 2017-06-13: 1

## 2017-06-13 MED ORDER — HYDROCODONE-ACETAMINOPHEN 7.5-325 MG PO TABS: 1.0000 | ORAL_TABLET | Freq: Once | ORAL | Status: DC | PRN

## 2017-06-13 MED ORDER — LACTATED RINGERS IV SOLN
INTRAVENOUS | Status: DC
  Administered 2017-06-13: 10:00:00 via INTRAVENOUS

## 2017-06-13 MED ORDER — PROPOFOL 10 MG/ML IV BOLUS
INTRAVENOUS | Status: DC | PRN
  Administered 2017-06-13: 100 mg via INTRAVENOUS

## 2017-06-13 MED ORDER — EPHEDRINE SULFATE-NACL 50-0.9 MG/10ML-% IV SOSY
PREFILLED_SYRINGE | INTRAVENOUS | Status: DC | PRN
  Administered 2017-06-13: 5 mg via INTRAVENOUS

## 2017-06-13 SURGICAL SUPPLY — 23 items
BAG URO CATCHER STRL LF (MISCELLANEOUS) ×4 IMPLANT
CABLE HIGH FREQUENCY MONO STRZ (ELECTRODE) ×2 IMPLANT
CATH INTERMIT  6FR 70CM (CATHETERS) ×4 IMPLANT
CLOTH BEACON ORANGE TIMEOUT ST (SAFETY) ×4 IMPLANT
COVER SURGICAL LIGHT HANDLE (MISCELLANEOUS) ×4 IMPLANT
ELECT COAG MONOPOLAR (ELECTROSURGICAL) ×4
ELECT REM PT RETURN 15FT ADLT (MISCELLANEOUS) ×4 IMPLANT
ELECTRODE COAG MONOPOLAR (ELECTROSURGICAL) IMPLANT
GLOVE BIO SURGEON STRL SZ8 (GLOVE) ×4 IMPLANT
GLOVE BIOGEL M 8.0 STRL (GLOVE) ×4 IMPLANT
GOWN STRL REUS W/TWL LRG LVL3 (GOWN DISPOSABLE) ×4 IMPLANT
GOWN STRL REUS W/TWL XL LVL3 (GOWN DISPOSABLE) ×4 IMPLANT
GUIDEWIRE ANG ZIPWIRE 038X150 (WIRE) ×4 IMPLANT
GUIDEWIRE STR DUAL SENSOR (WIRE) ×2 IMPLANT
IV NS 1000ML (IV SOLUTION) ×4
IV NS 1000ML BAXH (IV SOLUTION) ×2 IMPLANT
MANIFOLD NEPTUNE II (INSTRUMENTS) ×4 IMPLANT
PACK CYSTO (CUSTOM PROCEDURE TRAY) ×4 IMPLANT
STENT URET 6FRX26 CONTOUR (STENTS) ×2 IMPLANT
TUBE FEEDING 8FR 16IN STR KANG (MISCELLANEOUS) IMPLANT
TUBING CONNECTING 10 (TUBING) ×3 IMPLANT
TUBING CONNECTING 10' (TUBING) ×1
TUBING UROLOGY SET (TUBING) ×4 IMPLANT

## 2017-06-13 NOTE — Anesthesia Procedure Notes (Signed)
Procedure Name: LMA Insertion Date/Time: 06/13/2017 11:52 AM Performed by: Victoriano Lain, CRNA Pre-anesthesia Checklist: Patient identified, Emergency Drugs available, Suction available, Patient being monitored and Timeout performed Patient Re-evaluated:Patient Re-evaluated prior to induction Oxygen Delivery Method: Circle system utilized Preoxygenation: Pre-oxygenation with 100% oxygen Induction Type: IV induction LMA: LMA with gastric port inserted LMA Size: 4.0 Number of attempts: 1 Placement Confirmation: positive ETCO2 and breath sounds checked- equal and bilateral Tube secured with: Tape Dental Injury: Teeth and Oropharynx as per pre-operative assessment

## 2017-06-13 NOTE — Op Note (Addendum)
Preoperative diagnosis: left ureteral cancer  Postoperative diagnosis: Same  Procedure: 1 cystoscopy 2  leftt retrograde pyelography 3. Intraoperative fluoroscopy, under one hour, with interpretation 4. left ureteroscopic tumor ablation 5. left 6 x 26 JJ stent exchange   Attending: Rosie Fate  Anesthesia: General  Estimated blood loss: None  Drains: left 6 x 26 JJ ureteral stent without tether  Specimens:bladder biopsy  Antibiotics:ancef  Findings: lefttdistal ureteral papillary mass less than 0.5cm. No hydronephrosis. Ureteral orifices in normal anatomic location.   Indications: Patient is a30 year old male with a history of leftureteral tumor s/p BCG. After discussing treatment options, he decided proceed with left ureteral tumor ablation.  Procedure in detail: The patient was brought to the operating room and a brief timeout was done to ensure correct patient, correct procedure, correct site. General anesthesia was administered patient was placed in dorsal lithotomy position. Her genitalia was then prepped and draped in usual sterile fashion. A rigid 20 French cystoscope was passed in the urethra and the bladder. Bladder was inspected free masses or lesions. the ureteral orifices were in the normal orthotopic locations. Using a grasper the left ureteral stent was brought to the urethral meatus. A zipwire was advanced up to the renal pelvis and the stent was then removed.a 6 french ureteral catheter was then instilled into the left ureter orifice. a gentle retrograde was obtained and findings noted above. we then removed the cystoscope and cannulated the left ureteral orifice with a semirigid ureteroscope. We encountered an area in the distal ureter concerning for recurrent tumor. We used the ureteral bugbee to fulgerate the tumor.we then placed a 6 x 26 double-j ureteral stent over the original zip wire. We thenremoved the wireand good coil was  noted in the the renal pelvis under fluoroscopy and the bladder under direct vision. the bladder was then drained and this concluded the procedure which was well tolerated by patient.  Complications: None  Condition: Stable, extubated, transferred to PACU  Plan: Patient is to be discharged home as to follow-up in4 weeksto start BCG

## 2017-06-13 NOTE — Anesthesia Preprocedure Evaluation (Addendum)
Anesthesia Evaluation  Patient identified by MRN, date of birth, ID band Patient awake    Reviewed: Allergy & Precautions, NPO status , Patient's Chart, lab work & pertinent test results, reviewed documented beta blocker date and time   Airway Mallampati: II  TM Distance: >3 FB Neck ROM: Full    Dental  (+) Edentulous Upper, Edentulous Lower   Pulmonary shortness of breath and with exertion, sleep apnea and Continuous Positive Airway Pressure Ventilation , COPD,  COPD inhaler, former smoker,     + decreased breath sounds+ wheezing      Cardiovascular hypertension, Pt. on medications and Pt. on home beta blockers + Peripheral Vascular Disease  + dysrhythmias  Rhythm:Regular Rate:Normal  4-4.1 cm AAA infra renal- stable   Neuro/Psych  Headaches, PSYCHIATRIC DISORDERS Anxiety    GI/Hepatic Neg liver ROS, PUD, GERD  Medicated and Controlled,  Endo/Other  Hyperlipidemia  Renal/GU Renal diseaseLeft ureteral tumor Hx/o renal calculi   Bladder tumor    Musculoskeletal  (+) Arthritis , Osteoarthritis,    Abdominal   Peds  Hematology Plavix- last dose yesterday   Anesthesia Other Findings   Reproductive/Obstetrics                           Anesthesia Physical  Anesthesia Plan  ASA: III  Anesthesia Plan: General   Post-op Pain Management:    Induction: Intravenous  PONV Risk Score and Plan: Ondansetron and Dexamethasone  Airway Management Planned: LMA  Additional Equipment:   Intra-op Plan:   Post-operative Plan: Extubation in OR  Informed Consent: I have reviewed the patients History and Physical, chart, labs and discussed the procedure including the risks, benefits and alternatives for the proposed anesthesia with the patient or authorized representative who has indicated his/her understanding and acceptance.     Plan Discussed with: CRNA, Anesthesiologist and Surgeon  Anesthesia  Plan Comments:         Anesthesia Quick Evaluation

## 2017-06-13 NOTE — Anesthesia Postprocedure Evaluation (Signed)
Anesthesia Post Note  Patient: Douglas Monroe  Procedure(s) Performed: CYSTOSCOPY WITH RETROGRADE PYELOGRAM, URETEROSCOPY WITH ABLATION STENT EXCHANGE (Left ) TRANSURETHRAL RESECTION OF BLADDER TUMOR (TURBT) (N/A )     Patient location during evaluation: PACU Anesthesia Type: General Level of consciousness: awake and alert and oriented Pain management: pain level controlled Vital Signs Assessment: post-procedure vital signs reviewed and stable Respiratory status: spontaneous breathing, nonlabored ventilation and respiratory function stable Cardiovascular status: blood pressure returned to baseline and stable Postop Assessment: no apparent nausea or vomiting Anesthetic complications: no    Last Vitals:  Vitals:   06/13/17 1315 06/13/17 1320  BP: 131/81   Pulse: 64 67  Resp: 19 20  Temp:    SpO2: 94% 93%    Last Pain:  Vitals:   06/13/17 1315  TempSrc:   PainSc: 0-No pain                 Aslee Such A.

## 2017-06-13 NOTE — Interval H&P Note (Signed)
History and Physical Interval Note:  06/13/2017 11:39 AM  Douglas Monroe  has presented today for surgery, with the diagnosis of LEFT URETERAL TUMOR, BLADDER CANCER  The various methods of treatment have been discussed with the patient and family. After consideration of risks, benefits and other options for treatment, the patient has consented to  Procedure(s): CYSTOSCOPY WITH RETROGRADE PYELOGRAM, URETEROSCOPY WITH ABLATION STENT EXCHANGE (Left) TRANSURETHRAL RESECTION OF BLADDER TUMOR (TURBT) (N/A) as a surgical intervention .  The patient's history has been reviewed, patient examined, no change in status, stable for surgery.  I have reviewed the patient's chart and labs.  Questions were answered to the patient's satisfaction.     Nicolette Bang

## 2017-06-13 NOTE — Transfer of Care (Signed)
Immediate Anesthesia Transfer of Care Note  Patient: Douglas Monroe  Procedure(s) Performed: CYSTOSCOPY WITH RETROGRADE PYELOGRAM, URETEROSCOPY WITH ABLATION STENT EXCHANGE (Left ) TRANSURETHRAL RESECTION OF BLADDER TUMOR (TURBT) (N/A )  Patient Location: PACU  Anesthesia Type:General  Level of Consciousness: awake, alert , oriented and patient cooperative  Airway & Oxygen Therapy: Patient Spontanous Breathing and Patient connected to face mask oxygen  Post-op Assessment: Report given to RN, Post -op Vital signs reviewed and stable and Patient moving all extremities  Post vital signs: Reviewed and stable  Last Vitals:  Vitals Value Taken Time  BP 124/89 06/13/2017 12:43 PM  Temp    Pulse 67 06/13/2017 12:46 PM  Resp 28 06/13/2017 12:46 PM  SpO2 92 % 06/13/2017 12:46 PM  Vitals shown include unvalidated device data.  Last Pain:  Vitals:   06/13/17 0949  TempSrc: Oral      Patients Stated Pain Goal: 3 (30/16/01 0932)  Complications: No apparent anesthesia complications

## 2017-06-17 ENCOUNTER — Other Ambulatory Visit: Payer: Self-pay | Admitting: Pulmonary Disease

## 2017-06-21 ENCOUNTER — Other Ambulatory Visit: Payer: Self-pay | Admitting: Cardiovascular Disease

## 2017-06-21 DIAGNOSIS — K551 Chronic vascular disorders of intestine: Secondary | ICD-10-CM

## 2017-06-22 ENCOUNTER — Ambulatory Visit (HOSPITAL_COMMUNITY)
Admission: RE | Admit: 2017-06-22 | Discharge: 2017-06-22 | Disposition: A | Discharge status: Home / Self Care | Payer: Medicare Other | Source: Ambulatory Visit | Attending: Cardiovascular Disease | Admitting: Cardiovascular Disease

## 2017-06-22 ENCOUNTER — Inpatient Hospital Stay (HOSPITAL_COMMUNITY): Admission: RE | Admit: 2017-06-22 | Payer: Medicare Other | Source: Ambulatory Visit

## 2017-06-22 DIAGNOSIS — I708 Atherosclerosis of other arteries: Secondary | ICD-10-CM | POA: Insufficient documentation

## 2017-06-22 DIAGNOSIS — K551 Chronic vascular disorders of intestine: Secondary | ICD-10-CM

## 2017-06-23 ENCOUNTER — Telehealth: Payer: Self-pay | Admitting: Cardiovascular Disease

## 2017-06-23 ENCOUNTER — Encounter (HOSPITAL_COMMUNITY): Payer: Medicare Other

## 2017-06-23 NOTE — Telephone Encounter (Signed)
New Message:   Wife wants to talk to Dr Kennon Holter nurse.She have some questions she needs to ask.

## 2017-06-23 NOTE — Telephone Encounter (Signed)
Spoke with pt wife, doppler results discussed in detail with wife. His follow up appointment is 07-12-17 and she wants to make sure that is soon enough from dr berry. Will forward to dr berry to review and advise.

## 2017-06-24 NOTE — Telephone Encounter (Signed)
That is definitely soon enough

## 2017-06-28 DIAGNOSIS — C661 Malignant neoplasm of right ureter: Secondary | ICD-10-CM | POA: Diagnosis not present

## 2017-06-28 NOTE — Telephone Encounter (Signed)
Spoke with pt wife, aware of dr berry's recommendations.   

## 2017-06-29 ENCOUNTER — Ambulatory Visit (HOSPITAL_COMMUNITY)
Admission: RE | Admit: 2017-06-29 | Discharge: 2017-06-29 | Disposition: A | Discharge status: Home / Self Care | Payer: Medicare Other | Source: Ambulatory Visit | Attending: Cardiology | Admitting: Cardiology

## 2017-06-29 DIAGNOSIS — Z87891 Personal history of nicotine dependence: Secondary | ICD-10-CM | POA: Insufficient documentation

## 2017-06-29 DIAGNOSIS — E785 Hyperlipidemia, unspecified: Secondary | ICD-10-CM | POA: Diagnosis not present

## 2017-06-29 DIAGNOSIS — I10 Essential (primary) hypertension: Secondary | ICD-10-CM | POA: Diagnosis not present

## 2017-06-29 DIAGNOSIS — I6523 Occlusion and stenosis of bilateral carotid arteries: Secondary | ICD-10-CM | POA: Insufficient documentation

## 2017-07-06 ENCOUNTER — Telehealth: Payer: Self-pay | Admitting: Pulmonary Disease

## 2017-07-06 NOTE — Telephone Encounter (Signed)
  Spoke with [name]  And advised her that I would leave the sample up front. She states he has reached the doughnut hole and his Trelegy went up to over 100 dollars from 50 dollars and they need a sample for a week or so until they can pay for it. Nothing further is needed.  Assessment:  Severe COPD Continues on Trelegy, albuterol inhaler and nebulizer He is on prednisone taper for minor exacerbation.  There is no wheezing in the office today and I do not believe he will require additional prednisone. Continue Mucinex and flutter valve for clearance of secretion  Abnormal CT scan CT scan noted with chronic left base scarring.  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   1.5 cm left lower lobe lung nodule is not FDG avid and likely benign.  Plan/Recommendations: -Continue Trelegy, albuterol -Finish prednisone taper

## 2017-07-12 ENCOUNTER — Ambulatory Visit: Payer: Medicare Other | Admitting: Cardiovascular Disease

## 2017-07-12 ENCOUNTER — Encounter: Payer: Self-pay | Admitting: Cardiovascular Disease

## 2017-07-12 DIAGNOSIS — E7800 Pure hypercholesterolemia, unspecified: Secondary | ICD-10-CM

## 2017-07-12 DIAGNOSIS — I701 Atherosclerosis of renal artery: Secondary | ICD-10-CM

## 2017-07-12 DIAGNOSIS — I739 Peripheral vascular disease, unspecified: Secondary | ICD-10-CM

## 2017-07-12 DIAGNOSIS — I10 Essential (primary) hypertension: Secondary | ICD-10-CM | POA: Diagnosis not present

## 2017-07-12 DIAGNOSIS — I714 Abdominal aortic aneurysm, without rupture, unspecified: Secondary | ICD-10-CM

## 2017-07-12 NOTE — Progress Notes (Signed)
07/12/2017 CHESKY HEYER   03-Jul-1932  [id number]  Primary Physician Glendale Chard, MD Primary Cardiologist: [name] FACP, [place], [place], [name]  HPI:  Douglas Monroe is a 84 y.o.  thin-appearing, married Caucasian male, father of 2, grandfather to 5 grandchildren who I last saw in the office  04/27/2016 He works part time Administrator and was referred initially to me for claudication. He stopped smoking over a year ago and has COPD, hypertension and hyperlipidemia. He had normal coronary arteries by cath performed by Dr. Tami Ribas in 2004 and normal LV function. I stented both of his iliacs in the past, as well as recanalized his right SFA. He did have an incidentally noted 80% left renal artery stenosis which we have been following by duplex ultrasound and has remained stable, as well as moderate left internal carotid artery stenosis. We follow this as well. He is neurologically asymptomatic. He did have recurrent claudication with a drop in his left ABI and reangiogram which revealed in-stent restenosis with progression of disease. I restented his left iliac ostium with an iCAST covered stent and then stented his entire left external iliac artery with an Absolute Pro Nitinol self-expanding stent (8 x 100 mm.) His symptoms resolved and his Dopplers normalized. He no longer has claudication. He denies chest pain or shortness of breath. His most recent lipid profile performed in August revealed total cholesterol 112, LDL 62 and HDL 35.  Since I saw him in the office 02/16/12 he has remained stable. He denies chest pain, shortness of breath or claudication. This will Dopplers performed 09/12/12 didn't show a progression of the left renal artery stenosis. Based on this we decided to proceed with percutaneous intervention for renal preservation. This was performed in 11/02/12 at which time I placed a 6 mm x 12 mm long balloon expandable stent in the ostium of his left renal artery which was 80%  on angiography reduced to 0% residual. At the time of angiography to document a small abdominal aortic aneurysm which we are following up with ultrasound as well as patent stents in his common and external iliac arteries bilaterally. Since I saw him back in the office 11/29/12 his remained stable. He denies chest pain, increasing shortness of breath or claudication. He does have COPD and stopped smoking back in 2012. His most recent renal Dopplers performed in April revealed a widely patent left renal artery stent. His abdominal aortic aneurysm measures 3.7 cm followed on an annual basis. His lower extremity Dopplers performed 04/23/16 revealed patent iliac stents as well as right SFA stent with normal ABIs. His carotid Dopplers performed 10/16/13 revealed mild to moderate left ICA stenosis. Since I saw him a year ago his remained stable. He does have some dyspnea on exertion probably from his COPD but denies chest pain or claudication. Since I saw him a year ago he is remained stable.  He does have severe COPD dyspnea which is lifestyle limiting.  We have been following his Dopplers of his carotids, renals, abdominal aorta and lower extremities all of which remained stable.  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     Current Meds  Medication Sig  . albuterol (PROVENTIL HFA;VENTOLIN HFA) 108 (90 Base) MCG/ACT inhaler Inhale 2 puffs into the lungs every 6 (six) hours as needed for wheezing or shortness of breath.  [name] albuterol (PROVENTIL) (2.5 MG/3ML) 0.083% nebulizer solution Take 2.5 mg by nebulization every  6 (six) hours as needed for wheezing or shortness of breath.  [name] amLODipine (NORVASC) 5 MG tablet Take 5 mg by mouth daily.   . Aspirin-Acetaminophen-Caffeine (GOODY HEADACHE PO) Take 1 packet by mouth daily as needed (for headache).  [name] atorvastatin (LIPITOR) 20 MG tablet TAKE 1 TABLET BY MOUTH  DAILY AT 6 PM. (Patient taking differently:  TAKE 20 MG BY MOUTH DAILY)  . azithromycin (ZITHROMAX) 250 MG tablet Take 1 tablet (250 mg total) by mouth daily.  . benzonatate (TESSALON) 100 MG capsule Take 1 capsule (100 mg total) by mouth 2 (two) times daily as needed for cough.  . BYSTOLIC 10 MG tablet Take 10 mg by mouth daily.  . cetirizine (ZYRTEC) 10 MG tablet Take 10 mg by mouth 2 (two) times daily.  . clopidogrel (PLAVIX) 75 MG tablet TAKE 1 TABLET BY MOUTH  EVERY DAY (Patient taking differently: TAKE 75 MG BY MOUTH  EVERY DAY)  . [employer] D3 2000 units CAPS Take 2,000 Units by mouth daily.   [name] EPINEPHrine 0.3 mg/0.3 mL IJ SOAJ injection Inject 0.3 mg into the muscle once.  . finasteride (PROSCAR) 5 MG tablet Take 5 mg by mouth daily.   . Fluticasone-Umeclidin-Vilant (TRELEGY ELLIPTA) 100-62.5-25 MCG/INH AEPB Inhale 1 puff into the lungs daily.  [name] HYDROcodone-acetaminophen (NORCO) 5-325 MG tablet Take 1 tablet by mouth every 4 (four) hours as needed for moderate pain.  . pantoprazole (PROTONIX) 40 MG tablet Take 40 mg by mouth daily.  . predniSONE (DELTASONE) 10 MG tablet Take 40mg  x 3 days then 30mg  x 3 days then 20mg  x 3 days then 10mg  x 2 days then stop (Patient taking differently: Take 10-40 mg by mouth See admin instructions. Take 40mg  x 3 days then 30mg  x 3 days then 20mg  x 3 days then 10mg  x 2 days then stop)  . ranitidine (ZANTAC) 150 MG tablet Take 150 mg by mouth 2 (two) times daily.   . tamsulosin (FLOMAX) 0.4 MG CAPS capsule Take 0.4 mg by mouth daily.      Allergies  Allergen Reactions  . Pneumococcal Vaccines Swelling and Other (See Comments)    Lip swelling    Social History   Socioeconomic History  . Marital status: Married    Spouse name: Not on file  . Number of children: Not on file  . Years of education: Not on file  . Highest education level: Not on file  Occupational History  . Not on file  Social Needs  . Financial resource strain: Not on file  . Food insecurity:    Worry: Not on file    Inability:  Not on file  . Transportation needs:    Medical: Not on file    Non-medical: Not on file  Tobacco Use  . Smoking status: Former Smoker    Packs/day: 1.00    Years: 60.00    Pack years: 60.00    Types: Cigarettes    Last attempt to quit: 06/02/2010    Years since quitting: 7.1  . Smokeless tobacco: Never Used  Substance and Sexual Activity  . Alcohol use: No  . Drug use: No  . Sexual activity: Not Currently  Lifestyle  . Physical activity:    Days per week: Not on file    Minutes per session: Not on file  . Stress: Not on file  Relationships  . Social connections:    Talks on phone: Not on file    Gets together: Not on file  Attends religious service: Not on file    Active member of club or organization: Not on file    Attends meetings of clubs or organizations: Not on file    Relationship status: Not on file  . Intimate partner violence:    Fear of current or ex partner: Not on file    Emotionally abused: Not on file    Physically abused: Not on file    Forced sexual activity: Not on file  Other Topics Concern  . Not on file  Social History Narrative  . Not on file     Review of Systems: General: negative for chills, fever, night sweats or weight changes.  Cardiovascular: negative for chest pain, dyspnea on exertion, edema, orthopnea, palpitations, paroxysmal nocturnal dyspnea or shortness of breath Dermatological: negative for rash Respiratory: negative for cough or wheezing Urologic: negative for hematuria Abdominal: negative for nausea, vomiting, diarrhea, bright red blood per rectum, melena, or hematemesis Neurologic: negative for visual changes, syncope, or dizziness All other systems reviewed and are otherwise negative except as noted above.    Blood pressure 138/64, pulse 75, height 5\' 6"  (1.676 m), weight 150 lb (68 kg).  General appearance: alert and no distress Neck: no adenopathy, no carotid bruit, no JVD, supple, symmetrical, trachea midline and  thyroid not enlarged, symmetric, no tenderness/mass/nodules Lungs: clear to auscultation bilaterally Heart: regular rate and rhythm, S1, S2 normal, no murmur, click, rub or gallop Extremities: extremities normal, atraumatic, no cyanosis or edema Pulses: 2+ and symmetric Skin: Skin color, texture, turgor normal. No rashes or lesions Neurologic: Alert and oriented X 3, normal strength and tone. Normal symmetric reflexes. Normal coordination and gait  EKG not performed today  ASSESSMENT AND PLAN:   PVD (peripheral vascular disease) with claudication, previous stents to bilateral iliacs. Rt. SFA also.  History of peripheral arterial disease bilateral iliac stenting myself as well as recanalization of an occluded right SFA I also stented his left renal artery I restented the ostium of his left common iliac artery with an I cast covered stent because of restenosis as well as stenting of his left external iliac artery with a nitinol self-expanding stent.  He does have a small abdominal aortic aneurysm recently measured at 3.8 cm.  He denies claudication.  HTN (hypertension) History of essential hypertension her blood pressure measured at 138/64.  He is on Bystolic  and amlodipine.  Continue current meds at current dosing.  Renal artery stenosis, progression of disease History of left renal artery stenting by myself in 2014.  We have been following this by renal duplex most recently performed 06/14/2016 revealing it to be widely patent.  Claudication of both lower extremities ([place]) History of peripheral arterial disease status post bilateral iliac stenting with restenting of his left common iliac with a covered stent.  He also had recanalization of an occluded right SFA.  He has had Dopplers which been following this remained stable.  He denies claudication.  Abdominal aortic aneurysm (HCC) Small abdominal aortic aneurysm measuring 3.8 cm by duplex ultrasound which we followed on an annual  basis.  Hyperlipidemia History of hyperlipidemia on statin therapy followed by his PCP      [name] [facility], [facility] 07/12/2017 11:58 AM

## 2017-07-12 NOTE — Assessment & Plan Note (Signed)
History of hyperlipidemia on statin therapy followed by his PCP 

## 2017-07-12 NOTE — Assessment & Plan Note (Signed)
History of peripheral arterial disease status post bilateral iliac stenting with restenting of his left common iliac with a covered stent.  He also had recanalization of an occluded right SFA.  He has had Dopplers which been following this remained stable.  He denies claudication.

## 2017-07-12 NOTE — Assessment & Plan Note (Signed)
Small abdominal aortic aneurysm measuring 3.8 cm by duplex ultrasound which we followed on an annual basis.

## 2017-07-12 NOTE — Assessment & Plan Note (Signed)
History of peripheral arterial disease bilateral iliac stenting myself as well as recanalization of an occluded right SFA I also stented his left renal artery I restented the ostium of his left common iliac artery with an I cast covered stent because of restenosis as well as stenting of his left external iliac artery with a nitinol self-expanding stent.  He does have a small abdominal aortic aneurysm recently measured at 3.8 cm.  He denies claudication.

## 2017-07-12 NOTE — Assessment & Plan Note (Signed)
History of essential hypertension her blood pressure measured at 138/64.  He is on Bystolic  and amlodipine.  Continue current meds at current dosing.

## 2017-07-12 NOTE — Assessment & Plan Note (Signed)
History of left renal artery stenting by myself in 2014.  We have been following this by renal duplex most recently performed 06/14/2016 revealing it to be widely patent.

## 2017-07-12 NOTE — Patient Instructions (Signed)
Your physician wants you to follow-up in: ONE YEAR WITH DR BERRY You will receive a reminder letter in the mail two months in advance. If you don't receive a letter, please call our office to schedule the follow-up appointment.   If you need a refill on your cardiac medications before your next appointment, please call your pharmacy.  

## 2017-07-13 ENCOUNTER — Other Ambulatory Visit: Payer: Self-pay | Admitting: Cardiovascular Disease

## 2017-07-13 DIAGNOSIS — Z5111 Encounter for antineoplastic chemotherapy: Secondary | ICD-10-CM | POA: Diagnosis not present

## 2017-07-13 DIAGNOSIS — C661 Malignant neoplasm of right ureter: Secondary | ICD-10-CM | POA: Diagnosis not present

## 2017-07-13 NOTE — Telephone Encounter (Signed)
Rx(s) sent to pharmacy electronically.  

## 2017-07-20 DIAGNOSIS — Z5111 Encounter for antineoplastic chemotherapy: Secondary | ICD-10-CM | POA: Diagnosis not present

## 2017-07-20 DIAGNOSIS — C661 Malignant neoplasm of right ureter: Secondary | ICD-10-CM | POA: Diagnosis not present

## 2017-07-27 DIAGNOSIS — Z5111 Encounter for antineoplastic chemotherapy: Secondary | ICD-10-CM | POA: Diagnosis not present

## 2017-07-27 DIAGNOSIS — C661 Malignant neoplasm of right ureter: Secondary | ICD-10-CM | POA: Diagnosis not present

## 2017-09-26 ENCOUNTER — Other Ambulatory Visit: Payer: Self-pay | Admitting: Urology

## 2017-10-26 NOTE — Patient Instructions (Addendum)
Douglas Monroe  10/26/2017   Your procedure is scheduled on: 10-31-17   Report to [facility] Main  Entrance    Report to admitting at 9:30AM    Call this number if you have problems the morning of surgery [phone]     Remember: Do not eat food or drink liquids :After Midnight. BRUSH YOUR TEETH MORNING OF SURGERY AND RINSE YOUR MOUTH OUT, NO CHEWING GUM CANDY OR MINTS.     Take these medicines the morning of surgery with A SIP OF WATER: amlodipine, finasteride, tamsulosin, albuterol inhaler/nebulizer if needed, Trelegy, PLAVIX , atorvastatin                                 You may not have any metal on your body including hair pins and              piercings  Do not wear jewelry, make-up, lotions, powders or perfumes, deodorant                 Men may shave face and neck.   Do not bring valuables to the hospital. [place].  Contacts, dentures or bridgework may not be worn into surgery.      Patients discharged the day of surgery will not be allowed to drive home.  Name and phone number of your driver:  Special Instructions: N/A              Please read over the following fact sheets you were given: _____________________________________________________________________             [facility] - Preparing for Surgery Before surgery, you can play an important role.  Because skin is not sterile, your skin needs to be as free of germs as possible.  You can reduce the number of germs on your skin by washing with CHG (chlorahexidine gluconate) soap before surgery.  CHG is an antiseptic cleaner which kills germs and bonds with the skin to continue killing germs even after washing. Please DO NOT use if you have an allergy to CHG or antibacterial soaps.  If your skin becomes reddened/irritated stop using the CHG and inform your nurse when you arrive at Short Stay. Do not shave (including legs and underarms)  for at least 48 hours prior to the first CHG shower.  You may shave your face/neck. Please follow these instructions carefully:  1.  Shower with CHG Soap the night before surgery and the  morning of Surgery.  2.  If you choose to wash your hair, wash your hair first as usual with your  normal  shampoo.  3.  After you shampoo, rinse your hair and body thoroughly to remove the  shampoo.                           4.  Use CHG as you would any other liquid soap.  You can apply chg directly  to the skin and wash                       Gently with a scrungie or clean washcloth.  5.  Apply the CHG Soap to your body ONLY FROM THE  NECK DOWN.   Do not use on face/ open                           Wound or open sores. Avoid contact with eyes, ears mouth and genitals (private parts).                       Wash face,  Genitals (private parts) with your normal soap.             6.  Wash thoroughly, paying special attention to the area where your surgery  will be performed.  7.  Thoroughly rinse your body with warm water from the neck down.  8.  DO NOT shower/wash with your normal soap after using and rinsing off  the CHG Soap.                9.  Pat yourself dry with a clean towel.            10.  Wear clean pajamas.            11.  Place clean sheets on your bed the night of your first shower and do not  sleep with pets. Day of Surgery : Do not apply any lotions/deodorants the morning of surgery.  Please wear clean clothes to the hospital/surgery center.  FAILURE TO FOLLOW THESE INSTRUCTIONS MAY RESULT IN THE CANCELLATION OF YOUR SURGERY PATIENT SIGNATURE_________________________________  NURSE SIGNATURE__________________________________  ________________________________________________________________________

## 2017-10-26 NOTE — Progress Notes (Signed)
lov cardio Dr Adora Fridge 07-12-17 epic   lov pulm Dr Vaughan Browner 06-08-17 epic   VAS [place] [facility] 04-28-17 epic   EKG 11-21-16 epic

## 2017-10-27 ENCOUNTER — Encounter (HOSPITAL_COMMUNITY)
Admission: RE | Admit: 2017-10-27 | Discharge: 2017-10-27 | Disposition: A | Discharge status: Home / Self Care | Payer: Medicare Other | Source: Ambulatory Visit | Attending: Urology | Admitting: Urology

## 2017-10-27 ENCOUNTER — Encounter (HOSPITAL_COMMUNITY): Payer: Self-pay

## 2017-10-27 ENCOUNTER — Other Ambulatory Visit: Payer: Self-pay

## 2017-10-27 DIAGNOSIS — C662 Malignant neoplasm of left ureter: Secondary | ICD-10-CM | POA: Diagnosis not present

## 2017-10-27 DIAGNOSIS — Z01812 Encounter for preprocedural laboratory examination: Secondary | ICD-10-CM | POA: Insufficient documentation

## 2017-10-27 HISTORY — DX: Allergy to other foods: Z91.018

## 2017-10-27 LAB — CBC
HEMATOCRIT: 42.6 % (ref 39.0–52.0)
Hemoglobin: 13.6 g/dL (ref 13.0–17.0)
MCH: 29 pg (ref 26.0–34.0)
MCHC: 31.9 g/dL (ref 30.0–36.0)
MCV: 90.8 fL (ref 78.0–100.0)
PLATELETS: 207 10*3/uL (ref 150–400)
RBC: 4.69 MIL/uL (ref 4.22–5.81)
RDW: 15.4 % (ref 11.5–15.5)
WBC: 6.7 10*3/uL (ref 4.0–10.5)

## 2017-10-27 LAB — BASIC METABOLIC PANEL
Anion gap: 6 (ref 5–15)
BUN: 24 mg/dL — AB (ref 8–23)
CO2: 28 mmol/L (ref 22–32)
CREATININE: 1.33 mg/dL — AB (ref 0.61–1.24)
Calcium: 9.4 mg/dL (ref 8.9–10.3)
Chloride: 111 mmol/L (ref 98–111)
GFR calc Af Amer: 55 mL/min — ABNORMAL LOW (ref >60)
GFR, EST NON AFRICAN AMERICAN: 48 mL/min — AB (ref >60)
GLUCOSE: 100 mg/dL — AB (ref 70–99)
POTASSIUM: 4.3 mmol/L (ref 3.5–5.1)
SODIUM: 145 mmol/L (ref 135–145)

## 2017-10-28 ENCOUNTER — Telehealth: Payer: Self-pay | Admitting: Pulmonary Disease

## 2017-10-28 NOTE — Telephone Encounter (Signed)
Spoke with pt. He is requesting samples of Trelegy. These have been left up front for pick up. Nothing further was needed.

## 2017-10-31 ENCOUNTER — Ambulatory Visit (HOSPITAL_COMMUNITY): Payer: Medicare Other | Admitting: Anesthesiology

## 2017-10-31 ENCOUNTER — Encounter (HOSPITAL_COMMUNITY): Payer: Self-pay | Admitting: *Deleted

## 2017-10-31 ENCOUNTER — Ambulatory Visit (HOSPITAL_COMMUNITY)
Admission: RE | Admit: 2017-10-31 | Discharge: 2017-10-31 | Disposition: A | Discharge status: Home / Self Care | Payer: Medicare Other | Source: Ambulatory Visit | Attending: Urology | Admitting: Urology

## 2017-10-31 ENCOUNTER — Ambulatory Visit (HOSPITAL_COMMUNITY): Payer: Medicare Other

## 2017-10-31 ENCOUNTER — Encounter (HOSPITAL_COMMUNITY)
Admission: RE | Disposition: A | Discharge status: Home / Self Care | Payer: Self-pay | Source: Ambulatory Visit | Attending: Urology

## 2017-10-31 DIAGNOSIS — F419 Anxiety disorder, unspecified: Secondary | ICD-10-CM | POA: Insufficient documentation

## 2017-10-31 DIAGNOSIS — I714 Abdominal aortic aneurysm, without rupture: Secondary | ICD-10-CM | POA: Diagnosis not present

## 2017-10-31 DIAGNOSIS — I6522 Occlusion and stenosis of left carotid artery: Secondary | ICD-10-CM | POA: Insufficient documentation

## 2017-10-31 DIAGNOSIS — I10 Essential (primary) hypertension: Secondary | ICD-10-CM | POA: Insufficient documentation

## 2017-10-31 DIAGNOSIS — I739 Peripheral vascular disease, unspecified: Secondary | ICD-10-CM | POA: Insufficient documentation

## 2017-10-31 DIAGNOSIS — H9190 Unspecified hearing loss, unspecified ear: Secondary | ICD-10-CM | POA: Insufficient documentation

## 2017-10-31 DIAGNOSIS — Z8249 Family history of ischemic heart disease and other diseases of the circulatory system: Secondary | ICD-10-CM | POA: Diagnosis not present

## 2017-10-31 DIAGNOSIS — M19041 Primary osteoarthritis, right hand: Secondary | ICD-10-CM | POA: Insufficient documentation

## 2017-10-31 DIAGNOSIS — Z91018 Allergy to other foods: Secondary | ICD-10-CM | POA: Diagnosis not present

## 2017-10-31 DIAGNOSIS — F430 Acute stress reaction: Secondary | ICD-10-CM | POA: Insufficient documentation

## 2017-10-31 DIAGNOSIS — Z87891 Personal history of nicotine dependence: Secondary | ICD-10-CM | POA: Insufficient documentation

## 2017-10-31 DIAGNOSIS — Z809 Family history of malignant neoplasm, unspecified: Secondary | ICD-10-CM | POA: Insufficient documentation

## 2017-10-31 DIAGNOSIS — E785 Hyperlipidemia, unspecified: Secondary | ICD-10-CM | POA: Insufficient documentation

## 2017-10-31 DIAGNOSIS — N131 Hydronephrosis with ureteral stricture, not elsewhere classified: Secondary | ICD-10-CM

## 2017-10-31 DIAGNOSIS — K219 Gastro-esophageal reflux disease without esophagitis: Secondary | ICD-10-CM | POA: Diagnosis not present

## 2017-10-31 DIAGNOSIS — Z85828 Personal history of other malignant neoplasm of skin: Secondary | ICD-10-CM | POA: Diagnosis not present

## 2017-10-31 DIAGNOSIS — M19042 Primary osteoarthritis, left hand: Secondary | ICD-10-CM | POA: Insufficient documentation

## 2017-10-31 DIAGNOSIS — Z887 Allergy status to serum and vaccine status: Secondary | ICD-10-CM | POA: Insufficient documentation

## 2017-10-31 DIAGNOSIS — J439 Emphysema, unspecified: Secondary | ICD-10-CM | POA: Diagnosis not present

## 2017-10-31 DIAGNOSIS — C662 Malignant neoplasm of left ureter: Secondary | ICD-10-CM | POA: Diagnosis not present

## 2017-10-31 DIAGNOSIS — Z87442 Personal history of urinary calculi: Secondary | ICD-10-CM | POA: Insufficient documentation

## 2017-10-31 DIAGNOSIS — R0600 Dyspnea, unspecified: Secondary | ICD-10-CM | POA: Insufficient documentation

## 2017-10-31 DIAGNOSIS — I440 Atrioventricular block, first degree: Secondary | ICD-10-CM | POA: Insufficient documentation

## 2017-10-31 DIAGNOSIS — Z833 Family history of diabetes mellitus: Secondary | ICD-10-CM | POA: Diagnosis not present

## 2017-10-31 DIAGNOSIS — G4730 Sleep apnea, unspecified: Secondary | ICD-10-CM | POA: Diagnosis not present

## 2017-10-31 HISTORY — PX: CYSTOSCOPY WITH RETROGRADE PYELOGRAM, URETEROSCOPY AND STENT PLACEMENT: SHX5789

## 2017-10-31 SURGERY — CYSTOURETEROSCOPY, WITH RETROGRADE PYELOGRAM AND STENT INSERTION
Anesthesia: General | Laterality: Left

## 2017-10-31 MED ORDER — DEXAMETHASONE SODIUM PHOSPHATE 10 MG/ML IJ SOLN
INTRAMUSCULAR | Status: DC | PRN
  Administered 2017-10-31: 10 mg via INTRAVENOUS

## 2017-10-31 MED ORDER — LACTATED RINGERS IV SOLN
INTRAVENOUS | Status: DC
  Administered 2017-10-31: 10:00:00 via INTRAVENOUS

## 2017-10-31 MED ORDER — STERILE WATER FOR IRRIGATION IR SOLN
Status: DC | PRN
  Administered 2017-10-31: 3000 mL

## 2017-10-31 MED ORDER — HYDROCODONE-ACETAMINOPHEN 5-325 MG PO TABS: 1.0000 | ORAL_TABLET | ORAL | 0 refills | Status: DC | PRN

## 2017-10-31 MED ORDER — FENTANYL CITRATE (PF) 100 MCG/2ML IJ SOLN
INTRAMUSCULAR | Status: AC
  Filled 2017-10-31: qty 2

## 2017-10-31 MED ORDER — PHENYLEPHRINE 40 MCG/ML (10ML) SYRINGE FOR IV PUSH (FOR BLOOD PRESSURE SUPPORT)
PREFILLED_SYRINGE | INTRAVENOUS | Status: DC | PRN
  Administered 2017-10-31: 80 ug via INTRAVENOUS

## 2017-10-31 MED ORDER — PROMETHAZINE HCL 25 MG/ML IJ SOLN: 6.2500 mg | INTRAMUSCULAR | Status: DC | PRN

## 2017-10-31 MED ORDER — CEFAZOLIN SODIUM-DEXTROSE 2-4 GM/100ML-% IV SOLN
2.0000 g | INTRAVENOUS | Status: AC
  Administered 2017-10-31: 2 g via INTRAVENOUS
  Filled 2017-10-31: qty 100

## 2017-10-31 MED ORDER — FENTANYL CITRATE (PF) 100 MCG/2ML IJ SOLN: 25.0000 ug | INTRAMUSCULAR | Status: DC | PRN

## 2017-10-31 MED ORDER — PROPOFOL 10 MG/ML IV BOLUS
INTRAVENOUS | Status: DC | PRN
  Administered 2017-10-31: 125 mg via INTRAVENOUS

## 2017-10-31 MED ORDER — EPHEDRINE SULFATE-NACL 50-0.9 MG/10ML-% IV SOSY
PREFILLED_SYRINGE | INTRAVENOUS | Status: DC | PRN
  Administered 2017-10-31: 5 mg via INTRAVENOUS

## 2017-10-31 MED ORDER — ONDANSETRON HCL 4 MG/2ML IJ SOLN
INTRAMUSCULAR | Status: DC | PRN
  Administered 2017-10-31: 4 mg via INTRAVENOUS

## 2017-10-31 MED ORDER — SODIUM CHLORIDE 0.9 % IR SOLN
Status: DC | PRN
  Administered 2017-10-31: 3000 mL

## 2017-10-31 MED ORDER — IOHEXOL 300 MG/ML  SOLN
INTRAMUSCULAR | Status: DC | PRN
  Administered 2017-10-31: 10 mL

## 2017-10-31 MED ORDER — FENTANYL CITRATE (PF) 100 MCG/2ML IJ SOLN
INTRAMUSCULAR | Status: DC | PRN
  Administered 2017-10-31: 25 ug via INTRAVENOUS

## 2017-10-31 MED ORDER — PROPOFOL 10 MG/ML IV BOLUS
INTRAVENOUS | Status: AC
  Filled 2017-10-31: qty 20

## 2017-10-31 SURGICAL SUPPLY — 19 items
BAG URO CATCHER STRL LF (MISCELLANEOUS) ×3 IMPLANT
CATH INTERMIT  6FR 70CM (CATHETERS) ×3 IMPLANT
CLOTH BEACON ORANGE TIMEOUT ST (SAFETY) ×3 IMPLANT
EXTRACTOR STONE NITINOL NGAGE (UROLOGICAL SUPPLIES) IMPLANT
FIBER LASER TRAC TIP (UROLOGICAL SUPPLIES) IMPLANT
GLOVE BIO SURGEON STRL SZ8 (GLOVE) ×3 IMPLANT
GOWN STRL REUS W/TWL XL LVL3 (GOWN DISPOSABLE) ×3 IMPLANT
GUIDEWIRE ANG ZIPWIRE 038X150 (WIRE) ×3 IMPLANT
GUIDEWIRE STR DUAL SENSOR (WIRE) ×2 IMPLANT
IV NS 1000ML (IV SOLUTION)
IV NS 1000ML BAXH (IV SOLUTION) ×1 IMPLANT
MANIFOLD NEPTUNE II (INSTRUMENTS) ×3 IMPLANT
PACK CYSTO (CUSTOM PROCEDURE TRAY) ×3 IMPLANT
SHEATH URETERAL 12FRX35CM (MISCELLANEOUS) IMPLANT
STENT CONTOUR 6FRX26X.038 (STENTS) IMPLANT
STENT URET 6FRX26 CONTOUR (STENTS) ×2 IMPLANT
TUBE FEEDING 8FR 16IN STR KANG (MISCELLANEOUS) IMPLANT
TUBING CONNECTING 10 (TUBING) ×2 IMPLANT
TUBING CONNECTING 10' (TUBING) ×1

## 2017-10-31 NOTE — Op Note (Signed)
Preoperative diagnosis: left ureteral cancer  Postoperative diagnosis: Same  Procedure: 1 cystoscopy 2  leftt retrograde pyelography 3. Intraoperative fluoroscopy, under one hour, with interpretation 4. left ureteroscopic tumor ablation 5. left 6 x 26 JJ stent placement  Attending: Rosie Fate  Anesthesia: General  Estimated blood loss: None  Drains: left 6 x 26 JJ ureteral stent without tether  Specimens:bladder biopsy  Antibiotics:ancef  Findings: leftdistal ureteralpapillary mass less than 0.2cm. No hydronephrosis. Ureteral orifices in normal anatomic location.   Indications: Patient is a4 year old male with a history of leftureteral tumor s/p BCG. After discussing treatment options, he decided proceed with left ureteral tumor ablation.  Procedure in detail: The patient was brought to the operating room and a brief timeout was done to ensure correct patient, correct procedure, correct site. General anesthesia was administered patient was placed in dorsal lithotomy position. Her genitalia was then prepped and draped in usual sterile fashion. A rigid 15 French cystoscope was passed in the urethra and the bladder. Bladder was inspected free masses or lesions. the ureteral orifices were in the normal orthotopic locations.  a 6 french ureteral catheter was then instilled into the left ureter orifice. a gentle retrograde was obtained and findings noted above. A zipwire was advanced up to the renal pelvis and the stent was then removed.we then removed the cystoscope and cannulated the left ureteral orifice with a semirigid ureteroscope. We encountered an area in the distal ureter concerning for recurrent tumor. We used the ureteral bugbee to fulgerate thetumor.we then placed a 6 x 26 double-j ureteral stent over the original zip wire. We thenremoved the wireand good coil was noted in the the renal pelvis under fluoroscopy and the bladder under direct  vision.the bladder was then drained and this concluded the procedure which was well tolerated by patient.  Complications: None  Condition: Stable, extubated, transferred to PACU  Plan: Patient is to be discharged home as to follow-up in4 weeksfor stent removal

## 2017-10-31 NOTE — Anesthesia Preprocedure Evaluation (Signed)
Anesthesia Evaluation  Patient identified by MRN, date of birth, ID band Patient awake    Reviewed: Allergy & Precautions, NPO status , Patient's Chart, lab work & pertinent test results  Airway Mallampati: II  TM Distance: >3 FB Neck ROM: Full    Dental no notable dental hx.    Pulmonary COPD, former smoker,    Pulmonary exam normal breath sounds clear to auscultation       Cardiovascular hypertension, + Peripheral Vascular Disease  Normal cardiovascular exam Rhythm:Regular Rate:Normal     Neuro/Psych negative neurological ROS  negative psych ROS   GI/Hepatic Neg liver ROS, GERD  Medicated,  Endo/Other  negative endocrine ROS  Renal/GU negative Renal ROS  negative genitourinary   Musculoskeletal negative musculoskeletal ROS (+)   Abdominal   Peds negative pediatric ROS (+)  Hematology negative hematology ROS (+)   Anesthesia Other Findings   Reproductive/Obstetrics negative OB ROS                             Anesthesia Physical Anesthesia Plan  ASA: III  Anesthesia Plan: General   Post-op Pain Management:    Induction: Intravenous  PONV Risk Score and Plan: 2 and Ondansetron, Dexamethasone and Treatment may vary due to age or medical condition  Airway Management Planned: LMA  Additional Equipment:   Intra-op Plan:   Post-operative Plan: Extubation in OR  Informed Consent: I have reviewed the patients History and Physical, chart, labs and discussed the procedure including the risks, benefits and alternatives for the proposed anesthesia with the patient or authorized representative who has indicated his/her understanding and acceptance.   Dental advisory given  Plan Discussed with: CRNA and Surgeon  Anesthesia Plan Comments:         Anesthesia Quick Evaluation

## 2017-10-31 NOTE — H&P (Signed)
Urology Admission H&P  Chief Complaint: left ureteral cacner  History of Present Illness: Mr Kimmey is a 84yo with a hx of high grade left distal ureteral TCC for for surveillance ureteroscopy after BCG therapy. No new LUTS. No hematuria  Past Medical History:  Diagnosis Date  . Abdominal aortic aneurysm ([place])    infrarenal -- monitored by dr berry (note states stable 4.0 to 4.1cm)  . Allergy to alpha-gal    per aptient and wife; reports he was diagnosed in spring 2019 after a bite form possibly a chigger or tick ;    . Anxiety    situational  . Arthritis    "hands" (03/15/2016)  . Cancer of skin of temple 2017   right  . COPD with emphysema ([place])   . Dyspnea    with exertion  . Exertional dyspnea   . First degree heart block   . GERD (gastroesophageal reflux disease)   . Headache(784.0)    uses goody powder; "maybe weekly, maybe not that much" (03/15/2016)  . History of kidney stones   . HOH (hard of hearing)    left ear; no hearing aid   . HTN (hypertension) 08/20/2011  . Hyperlipidemia   . Hypertension   . Ischemic colitis ([place])   . Left carotid artery stenosis    moderate  . Migraine    "long time ago" (03/15/2016)  . 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 PVD (peripheral vascular disease) with claudication [facility]) cardiologist-  dr berry   s/p bilateral iliac stents and right sfa stenting/  doppper study 09-05-2012  revealed ABIs close to one bilaterally with patent stents/  stenting left renal artery stenosis   . Right ureteral stone   . S/P arterial stent    left renal angioplasty and stent 11-02-2012  . Sleep apnea    at risk for OSA. stop/bang score= 6; sent to PCP 10/04/13; denies use of mask on 03/15/2016  . Tubular adenoma of colon   . Urticaria    Past Surgical History:  Procedure Laterality Date  . ABDOMINAL ANGIOGRAM  08/19/2011   Left common iliac artery at the ostium, 6x10mm ICAST covered stent, common femoral artery, 8x146mm  Abbott absolute pro nitinol self-expanding stent and resulting in reduction of 80 and 90% stenosis to 0% residual  . ABDOMINAL ANGIOGRAM  10/08/2010   Mid-right SFA, 6x120 EV3 Protege nitinol self-expanding stent, resulting in reduction of CTO to 0% residual  . ABDOMINAL ANGIOGRAM  06/22/2010   Rt Common Femoral Artery-8x3 Absolute Pro Nitinol self-expanding stent-90% stenosis to 0% residual; Rt External Iliac Artery-9x3 Absolute Pro Nitinol self-expanding stent-70% stenosis to 0% residual; Left External Iliac Artery-8x4 Absolute Pro Nitinol self-expanding stent-90% to 0% residual  . ATHERECTOMY N/A 08/19/2011   Procedure: ATHERECTOMY;  Surgeon: Lorretta Harp, MD;  Location: [facility] CATH LAB;  Service: Cardiovascular;  Laterality: N/A;  . BACK SURGERY    . CARDIAC CATHETERIZATION  10/26/2002  dr Tami Ribas   normal coronary arteries/ normal  LVF  . CARDIOVASCULAR STRESS TEST  06-12-2010   dr berry   normal perfusion study/ no ischemia/  ef 69%  . COLONOSCOPY WITH PROPOFOL N/A 03/19/2016   Procedure: COLONOSCOPY WITH PROPOFOL;  Surgeon: Mauri Pole, MD;  Location: [facility];  Service: Endoscopy;  Laterality: N/A;  was inpatient, but got d/c'd home and is coming back as an OP  . CYSTOSCOPY W/ URETERAL STENT PLACEMENT Left 06/07/2016   Procedure: CYSTOSCOPY WITH RETROGRADE PYELOGRAM/URETERAL STENT REPLACEMENT;  Surgeon: Cleon Gustin, MD;  Location: [facility];  Service: Urology;  Laterality: Left;  . CYSTOSCOPY W/ URETERAL STENT PLACEMENT Left 09/20/2016   Procedure: CYSTOSCOPY WITH RETROGRADE PYELOGRAM/URETERAL STENT PLACEMENT;  Surgeon: Cleon Gustin, MD;  Location: [facility];  Service: Urology;  Laterality: Left;  . CYSTOSCOPY WITH RETROGRADE PYELOGRAM, URETEROSCOPY AND STENT PLACEMENT Right 10/10/2013   Procedure: uretheral dilation, CYSTOSCOPY, URETEROSCOPY, stone extraction, STENT PLACEMENT;  Surgeon: [name], MD;  Location: [facility];  Service: Urology;  Laterality:  Right;  . CYSTOSCOPY WITH RETROGRADE PYELOGRAM, URETEROSCOPY AND STENT PLACEMENT Left 02/08/2017   Procedure: CYSTOSCOPY WITH RETROGRADE PYELOGRAM, URETEROSCOPY;  Surgeon: Cleon Gustin, MD;  Location: [facility];  Service: Urology;  Laterality: Left;  . CYSTOSCOPY WITH RETROGRADE PYELOGRAM, URETEROSCOPY AND STENT PLACEMENT Left 05/16/2017   Procedure: CYSTOSCOPY WITH RETROGRADE PYELOGRAM, URETEROSCOPY WITH ABLATION OF URETERAL TUMOR, BLADDER BIOPSY AND FULGURATION, AND STENT PLACEMENT;  Surgeon: Cleon Gustin, MD;  Location: [facility];  Service: Urology;  Laterality: Left;  . CYSTOSCOPY WITH RETROGRADE PYELOGRAM, URETEROSCOPY AND STENT PLACEMENT Left 06/13/2017   Procedure: CYSTOSCOPY WITH RETROGRADE PYELOGRAM, URETEROSCOPY STENT EXCHANGE;  Surgeon: Cleon Gustin, MD;  Location: [facility];  Service: Urology;  Laterality: Left;  . CYSTOSCOPY WITH URETEROSCOPY Left 05/09/2016   Procedure: CYSTOSCOPY, RETROGRADE, LEFT URETEROSCOPY, LEFT URETERAL BIOPSIES, HOLMIUM LASER FULGERATION, LEFT URETERAL STENT PLACEMENT;  Surgeon: Irine Seal, MD;  Location: [facility];  Service: Urology;  Laterality: Left;  . CYSTOSCOPY/URETEROSCOPY/HOLMIUM LASER Left 09/20/2016   Procedure: CYSTOSCOPY/URETEROSCOPY/HOLMIUM LASER;  Surgeon: Cleon Gustin, MD;  Location: [facility];  Service: Urology;  Laterality: Left;  . HOLMIUM LASER APPLICATION Right 08/09/2954   Procedure: HOLMIUM LASER APPLICATION;  Surgeon: [name], MD;  Location: [facility];  Service: Urology;  Laterality: Right;  . HOLMIUM LASER APPLICATION Left 03/04/3084   Procedure: TUMOR ABLATION WITH HOLMIUM LASER;  Surgeon: Cleon Gustin, MD;  Location: [facility];  Service: Urology;  Laterality: Left;  . LUMBAR DISC SURGERY  1970's  . RENAL ANGIOGRAM N/A 11/02/2012   Procedure: RENAL ANGIOGRAM;  Surgeon: Lorretta Harp, MD;  Location: [facility] CATH LAB;  Service: Cardiovascular;  Laterality: N/A;  . RENAL ARTERY STENT Left 11/02/2012  . SKIN  CANCER EXCISION Right 2017   temple  . SOFT TISSUE CYST EXCISION  1960's   "outside part of lung LLL"  . TRANSURETHRAL RESECTION OF BLADDER TUMOR N/A 06/13/2017   Procedure: TRANSURETHRAL RESECTION OF BLADDER TUMOR (TURBT);  Surgeon: Cleon Gustin, MD;  Location: [facility];  Service: Urology;  Laterality: N/A;    Home Medications:  Current Facility-Administered Medications  Medication Dose Route Frequency Provider Last Rate Last Dose  . ceFAZolin (ANCEF) IVPB 2g/100 mL premix  2 g Intravenous 30 min Pre-Op Alyson Ingles Candee Furbish, MD      . lactated ringers infusion   Intravenous Continuous Myrtie Soman, MD 50 mL/hr at 10/31/17 1003     Allergies:  Allergies  Allergen Reactions  . Meat Extract Other (See Comments)    Alpha gal, red meat allergy   . Pneumococcal Vaccines Swelling and Other (See Comments)    Lip swelling    Family History  Problem Relation Age of Onset  . Hypertension Mother   . Cancer Father   . Diabetes Brother   . Hypertension Son    Social History:  reports that he quit smoking about 7 years ago. His smoking use included cigarettes. He has a 60.00 pack-year smoking history. He  has never used smokeless tobacco. He reports that he does not drink alcohol or use drugs.  Review of Systems  All other systems reviewed and are negative.   Physical Exam:  Vital signs in last 24 hours: Temp:  [97.6 F (36.4 C)] 97.6 F (36.4 C) (09/30 0940) Pulse Rate:  [81] 81 (09/30 0940) Resp:  [18] 18 (09/30 0940) BP: (146)/(81) 146/81 (09/30 0940) SpO2:  [93 %] 93 % (09/30 0940) Weight:  [68.9 kg] 68.9 kg (09/30 1001) Physical Exam  Constitutional: He is oriented to person, place, and time. He appears well-developed and well-nourished.  HENT:  Head: Normocephalic and atraumatic.  Eyes: Pupils are equal, round, and reactive to light. EOM are normal.  Neck: Normal range of motion. No thyromegaly present.  Cardiovascular: Normal rate and regular rhythm.  Respiratory:  Effort normal. No respiratory distress.  GI: Soft. He exhibits no distension.  Musculoskeletal: Normal range of motion. He exhibits no edema.  Neurological: He is alert and oriented to person, place, and time.  Skin: Skin is warm and dry.  Psychiatric: He has a normal mood and affect. His behavior is normal. Judgment and thought content normal.    Laboratory Data:  No results found for this or any previous visit (from the past 24 hour(s)). No results found for this or any previous visit (from the past 240 hour(s)). Creatinine: Recent Labs    10/27/17 1211  CREATININE 1.33*   Baseline Creatinine: 1.3  Impression/Assessment:  85yo with left ureteral TCC  Plan:  The risks/benefits/alternatives to left diagnostic ureteroscopy with possible tumor ablation was explained to the patient and he understands and wishes to proceed with surgery  Nicolette Bang 10/31/2017, 11:13 AM

## 2017-10-31 NOTE — Transfer of Care (Signed)
Immediate Anesthesia Transfer of Care Note  Patient: Douglas Monroe  Procedure(s) Performed: CYSTOSCOPY WITH RETROGRADE PYELOGRAM, URETEROSCOPY ,FULGURATION OF URETERAL TUMOR AND STENT PLACEMENT (Left )  Patient Location: PACU  Anesthesia Type:General  Level of Consciousness: awake, alert , oriented and patient cooperative  Airway & Oxygen Therapy: Patient Spontanous Breathing and Patient connected to nasal cannula oxygen  Post-op Assessment: Report given to RN and Post -op Vital signs reviewed and stable  Post vital signs: Reviewed and stable  Last Vitals:  Vitals Value Taken Time  BP 127/58 10/31/2017 12:05 PM  Temp    Pulse 63 10/31/2017 12:11 PM  Resp 19 10/31/2017 12:11 PM  SpO2 98 % 10/31/2017 12:11 PM  Vitals shown include unvalidated device data.  Last Pain:  Vitals:   10/31/17 0940  TempSrc: Oral         Complications: No apparent anesthesia complications

## 2017-10-31 NOTE — Anesthesia Procedure Notes (Signed)
Procedure Name: LMA Insertion Performed by: Gean Maidens, CRNA Pre-anesthesia Checklist: Patient identified, Suction available, Patient being monitored, Emergency Drugs available and Timeout performed Patient Re-evaluated:Patient Re-evaluated prior to induction Oxygen Delivery Method: Circle system utilized Preoxygenation: Pre-oxygenation with 100% oxygen Induction Type: IV induction Ventilation: Mask ventilation without difficulty LMA: LMA inserted LMA Size: 4.0 Number of attempts: 1 Placement Confirmation: positive ETCO2 and breath sounds checked- equal and bilateral Tube secured with: Tape Dental Injury: Teeth and Oropharynx as per pre-operative assessment

## 2017-10-31 NOTE — Discharge Instructions (Signed)

## 2017-11-01 ENCOUNTER — Encounter (HOSPITAL_COMMUNITY): Payer: Self-pay | Admitting: Urology

## 2017-11-01 NOTE — Anesthesia Postprocedure Evaluation (Signed)
Anesthesia Post Note  Patient: Douglas Monroe  Procedure(s) Performed: CYSTOSCOPY WITH RETROGRADE PYELOGRAM, URETEROSCOPY ,FULGURATION OF URETERAL TUMOR AND STENT PLACEMENT (Left )     Patient location during evaluation: PACU Anesthesia Type: General Level of consciousness: awake and alert Pain management: pain level controlled Vital Signs Assessment: post-procedure vital signs reviewed and stable Respiratory status: spontaneous breathing, nonlabored ventilation, respiratory function stable and patient connected to nasal cannula oxygen Cardiovascular status: blood pressure returned to baseline and stable Postop Assessment: no apparent nausea or vomiting Anesthetic complications: no    Last Vitals:  Vitals:   10/31/17 1307 10/31/17 1317  BP: (!) 151/57 (!) 145/55  Pulse: 65 65  Resp: 12 16  Temp: (!) 36.4 C 36.5 C  SpO2: 94% 96%    Last Pain:  Vitals:   10/31/17 0940  TempSrc: Oral                 [name] S

## 2017-11-04 ENCOUNTER — Other Ambulatory Visit: Payer: Self-pay | Admitting: Internal Medicine

## 2017-11-05 ENCOUNTER — Encounter: Payer: Self-pay | Admitting: Internal Medicine

## 2017-11-07 ENCOUNTER — Ambulatory Visit: Payer: Medicare Other | Admitting: Internal Medicine

## 2017-11-07 ENCOUNTER — Other Ambulatory Visit: Payer: Self-pay

## 2017-11-07 MED ORDER — NEBIVOLOL HCL 10 MG PO TABS: 10.0000 mg | ORAL_TABLET | Freq: Every day | ORAL | 0 refills | Status: DC

## 2017-11-08 ENCOUNTER — Ambulatory Visit (INDEPENDENT_AMBULATORY_CARE_PROVIDER_SITE_OTHER): Payer: Medicare Other | Admitting: Internal Medicine

## 2017-11-08 ENCOUNTER — Encounter: Payer: Self-pay | Admitting: Internal Medicine

## 2017-11-08 VITALS — BP 126/82 | HR 70 | Temp 97.7°F | Ht 66.5 in | Wt 156.6 lb

## 2017-11-08 DIAGNOSIS — N183 Chronic kidney disease, stage 3 unspecified: Secondary | ICD-10-CM

## 2017-11-08 DIAGNOSIS — R7309 Other abnormal glucose: Secondary | ICD-10-CM

## 2017-11-08 DIAGNOSIS — I1310 Hypertensive heart and chronic kidney disease without heart failure, with stage 1 through stage 4 chronic kidney disease, or unspecified chronic kidney disease: Secondary | ICD-10-CM

## 2017-11-08 DIAGNOSIS — I2510 Atherosclerotic heart disease of native coronary artery without angina pectoris: Secondary | ICD-10-CM

## 2017-11-08 DIAGNOSIS — E7800 Pure hypercholesterolemia, unspecified: Secondary | ICD-10-CM

## 2017-11-08 DIAGNOSIS — Z23 Encounter for immunization: Secondary | ICD-10-CM | POA: Diagnosis not present

## 2017-11-08 DIAGNOSIS — J449 Chronic obstructive pulmonary disease, unspecified: Secondary | ICD-10-CM

## 2017-11-08 NOTE — Progress Notes (Signed)
Subjective:     Patient ID: Douglas Monroe , male    DOB: Feb 23, 1932 , 84 y.o.   MRN: [id number]   Hypertension  This is a chronic problem. The current episode started more than 1 year ago. The problem is controlled. Risk factors for coronary artery disease include dyslipidemia and male gender. There are no compliance problems.  Identifiable causes of hypertension include chronic renal disease.     2. He is also here today for f/u hyperlipidemia.  He reports compliance with statin therapy. He has not had any issues with the medication.  Past Medical History:  Diagnosis Date  . Abdominal aortic aneurysm ([place])    infrarenal -- monitored by dr berry (note states stable 4.0 to 4.1cm)  . Allergy to alpha-gal    per aptient and wife; reports he was diagnosed in spring 2019 after a bite form possibly a chigger or tick ;    . Anxiety    situational  . Arthritis    "hands" (03/15/2016)  . Cancer of skin of temple 2017   right  . COPD with emphysema ([place])   . Dyspnea    with exertion  . Exertional dyspnea   . First degree heart block   . GERD (gastroesophageal reflux disease)   . Headache(784.0)    uses goody powder; "maybe weekly, maybe not that much" (03/15/2016)  . History of kidney stones   . HOH (hard of hearing)    left ear; no hearing aid   . HTN (hypertension) 08/20/2011  . Hyperlipidemia   . Hypertension   . Ischemic colitis ([place])   . Left carotid artery stenosis    moderate  . Migraine    "long time ago" (03/15/2016)  . 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 PVD (peripheral vascular disease) with claudication [facility]) cardiologist-  dr berry   s/p bilateral iliac stents and right sfa stenting/  doppper study 09-05-2012  revealed ABIs close to one bilaterally with patent stents/  stenting left renal artery stenosis   . Right ureteral stone   . S/P arterial stent    left renal angioplasty and stent 11-02-2012  . Sleep apnea    at risk for OSA.  stop/bang score= 6; sent to PCP 10/04/13; denies use of mask on 03/15/2016  . Tubular adenoma of colon   . Urticaria       Current Outpatient Medications:  .  albuterol (PROVENTIL HFA;VENTOLIN HFA) 108 (90 Base) MCG/ACT inhaler, Inhale 2 puffs into the lungs every 6 (six) hours as needed for wheezing or shortness of breath., Disp: , Rfl:  .  albuterol (PROVENTIL) (2.5 MG/3ML) 0.083% nebulizer solution, Take 2.5 mg by nebulization every 6 (six) hours as needed for wheezing or shortness of breath., Disp: , Rfl:  .  amLODipine (NORVASC) 5 MG tablet, Take 5 mg by mouth daily. , Disp: , Rfl:  .  Aspirin-Acetaminophen-Caffeine (GOODY HEADACHE PO), Take 1 packet by mouth daily as needed (for headache)., Disp: , Rfl:  .  atorvastatin (LIPITOR) 20 MG tablet, TAKE 1 TABLET BY MOUTH  DAILY AT 6 PM. (Patient taking differently: Take 20 mg by mouth every morning. Patient takes in the morning), Disp: 90 tablet, Rfl: 3 .  BYSTOLIC 10 MG tablet, Take 10 mg by mouth daily., Disp: , Rfl:  .  clopidogrel (PLAVIX) 75 MG tablet, TAKE 1 TABLET BY MOUTH  EVERY DAY (Patient taking differently: Take 75 mg by mouth daily. ), Disp: 90 tablet, Rfl: 3 .  [employer] D3 2000 units CAPS, Take 2,000 Units by mouth daily. , Disp: , Rfl: 5 .  EPINEPHrine 0.3 mg/0.3 mL IJ SOAJ injection, Inject 0.3 mg into the muscle daily as needed (allergic reaction). , Disp: , Rfl: 1 .  finasteride (PROSCAR) 5 MG tablet, Take 5 mg by mouth daily. , Disp: , Rfl:  .  Fluticasone-Umeclidin-Vilant (TRELEGY ELLIPTA) 100-62.5-25 MCG/INH AEPB, Inhale 1 puff into the lungs daily., Disp: 60 each, Rfl: 6 .  guaifenesin (ROBITUSSIN) 100 MG/5ML syrup, Take 200 mg by mouth 3 (three) times daily as needed for cough., Disp: , Rfl:  .  loratadine (CLARITIN) 10 MG tablet, Take 10 mg by mouth daily., Disp: , Rfl:  .  magnesium gluconate (MAGONATE) 500 MG tablet, Take 500 mg by mouth daily., Disp: , Rfl:  .  pantoprazole (PROTONIX) 40 MG tablet, TAKE 1 TABLET BY MOUTH  EVERY DAY, Disp: 90 tablet, Rfl: 1 .  ranitidine (ZANTAC) 150 MG tablet, Take 150 mg by mouth daily. , Disp: , Rfl:  .  tamsulosin (FLOMAX) 0.4 MG CAPS capsule, Take 0.4 mg by mouth daily. , Disp: , Rfl:  .  vitamin C (ASCORBIC ACID) 500 MG tablet, Take 500 mg by mouth daily., Disp: , Rfl:    Review of Systems  Constitutional: Negative.   HENT: Negative.   Respiratory: Negative.   Cardiovascular: Negative.   Psychiatric/Behavioral: Negative.      Today's Vitals   11/08/17 1125  BP: 126/82  Pulse: 70  Temp: 97.7 F (36.5 C)  TempSrc: Douglas  SpO2: 94%  Weight: 156 lb 9.6 oz (71 kg)  Height: 5' 6.5" (1.689 m)  PainSc: 4   PainLoc: Foot   Body mass index is 24.9 kg/m.   Objective:  Physical Exam  Constitutional: He appears well-developed and well-nourished.  HENT:  Head: Normocephalic and atraumatic.  Eyes: EOM are normal.  Neck: Normal range of motion.  Cardiovascular: Normal rate and regular rhythm.  Pulmonary/Chest: Effort normal. He has decreased breath sounds in the right lower field and the left lower field. He has rhonchi in the right middle field and the left middle field.  Psychiatric: He has a normal mood and affect.  Nursing note and vitals reviewed.       Assessment And Plan:     1. Hypertensive heart and renal disease with renal failure, stage 1 through stage 4 or unspecified chronic kidney disease, without heart failure WELL CONTROLLED. HE WILL CONTINUE WITH CURRENT MEDS. HE IS ENCOURAGED TO AVOID ADDING SALT TO HIS FOODS.   2. Atherosclerosis of native coronary artery of native heart without angina pectoris  CHRONIC, YET STABLE. HE IS ALSO FOLLOWED BY CARDIOLOGY.   3. Chronic kidney disease, stage III (moderate) (HCC)  CHRONIC. HE IS ENCOURAGED TO STAY WELL HYDRATED.   4. Pure hypercholesterolemia I WILL CHECK LIPID PANEL AND LFTS. HE IS ENCOURAGED TO INCORPORATE MORE ACTIVITY INTO HIS DAILY ROUTINE, AVOID FRIED FOODS, EAT 25-35 GRAMS OF FIBER, AND  TO EAT FISH AT LEAST TWICE WEEKLY. - Lipid Profile  5. COPD, severe ([place])  CHRONIC. HE WILL CONTINUE WITH CURRENT MEDS. HE IS ENCOURAGED TO F/U WITH PULMONARY AS SCHEDULED.   6. Other abnormal glucose  HIS A1C HAS BEEN ELEVATED IN THE PAST. I WILL CHECK AN A1C, BMET TODAY. HE WAS ENCOURAGED TO AVOID SUGARY BEVERAGES AND PROCESSED FOODS INCLUDNG BREADS, RICE AND PASTA.  - Hemoglobin A1c  7. Need for prophylactic vaccination and inoculation against influenza  HE WAS GIVEN HIGH DOSE FLU  VACCINE.   [name], MD

## 2017-11-09 LAB — HEMOGLOBIN A1C
ESTIMATED AVERAGE GLUCOSE: 146 mg/dL
HEMOGLOBIN A1C: 6.7 % — AB (ref 4.8–5.6)

## 2017-11-09 LAB — LIPID PANEL
CHOL/HDL RATIO: 2.9 ratio (ref 0.0–5.0)
CHOLESTEROL TOTAL: 105 mg/dL (ref 100–199)
HDL: 36 mg/dL — AB (ref >39)
LDL CALC: 54 mg/dL (ref 0–99)
TRIGLYCERIDES: 77 mg/dL (ref 0–149)
VLDL Cholesterol Cal: 15 mg/dL (ref 5–40)

## 2017-11-09 NOTE — Progress Notes (Signed)
Here are your lab results:  Your cholesterol looks pretty good. However, your good cholesterol has dropped. Be sure to stay active. Your a1c is 6.7, this is now in the diabetes range. I need to recheck this in 3 months. Please cut out sugary drinks, white breads, rice and pasta from your evening meals and increase your daily activity.   Please call the office to schedule an appt in 3 months for re-evaluation of possible new onset diabetes.   Sincerely,    Juston Goheen N. [facility], MD

## 2017-11-13 ENCOUNTER — Encounter: Payer: Self-pay | Admitting: Internal Medicine

## 2017-11-20 ENCOUNTER — Other Ambulatory Visit: Payer: Self-pay | Admitting: Internal Medicine

## 2017-12-19 ENCOUNTER — Other Ambulatory Visit: Payer: Self-pay | Admitting: Urology

## 2018-01-02 ENCOUNTER — Telehealth: Payer: Self-pay | Admitting: Pulmonary Disease

## 2018-01-02 NOTE — Telephone Encounter (Signed)
Called pt's wife Jana Half back stating to her that pt does need to schedule an OV. I stated to her if pt still will not schedule an OV, she could try to call his PCP to see if they would prescribe anything for him without an OV. Jana Half expressed understanding. Nothing further needed.

## 2018-01-02 NOTE — Telephone Encounter (Signed)
Pt last seen at office 06/08/17.  Instructions from last OV 06/08/17  The lungs sound clear today so I do not feel you need additional prednisone Finish the prednisone that you have already been prescribed Continue the Tessalon.  Use Mucinex over-the-counter for congestion We will check your oxygen levels on exertion today Follow-up in 6 months     Called and spoke with pt's wife Jana Half who states pt has a cough that has become worse than his usual cough. Pt's cough is loose sounding but is unable to get any mucus up. Pt denies any fever and pt has no more than usual chest tightness.  I stated the above to [facility] and while she was explaining this to pt, pt became irate and yelled to her that he was not going to be coming to office for an appt. Jana Half is wanting to know if something could be prescribed to help with pt's symptoms.  Aaron Edelman, please advise on this for pt. Thanks!

## 2018-01-02 NOTE — Telephone Encounter (Signed)
Pt needs to be seen in office for assessment and follow up.  Last OV was over 6 mo ago and patient was informed to follow up in that time.   Wyn Quaker FNP

## 2018-01-09 ENCOUNTER — Ambulatory Visit (INDEPENDENT_AMBULATORY_CARE_PROVIDER_SITE_OTHER): Payer: Medicare Other | Admitting: Nurse Practitioner

## 2018-01-09 ENCOUNTER — Encounter: Payer: Self-pay | Admitting: Nurse Practitioner

## 2018-01-09 VITALS — BP 130/68 | HR 75 | Temp 97.5°F | Ht 64.0 in | Wt 156.8 lb

## 2018-01-09 DIAGNOSIS — J069 Acute upper respiratory infection, unspecified: Secondary | ICD-10-CM

## 2018-01-09 DIAGNOSIS — J449 Chronic obstructive pulmonary disease, unspecified: Secondary | ICD-10-CM

## 2018-01-09 MED ORDER — PREDNISONE 10 MG (21) PO TBPK: ORAL_TABLET | ORAL | 0 refills | Status: AC

## 2018-01-09 MED ORDER — AMOXICILLIN 875 MG PO TABS: 875.0000 mg | ORAL_TABLET | Freq: Two times a day (BID) | ORAL | 0 refills | Status: AC

## 2018-01-09 NOTE — Patient Instructions (Signed)
Upper Respiratory Infection, Adult Most upper respiratory infections (URIs) are caused by a virus. A URI affects the nose, throat, and upper air passages. The most common type of URI is often called "the common cold." Follow these instructions at home:  Take medicines only as told by your doctor.  Gargle warm saltwater or take cough drops to comfort your throat as told by your doctor.  Use a warm mist humidifier or inhale steam from a shower to increase air moisture. This may make it easier to breathe.  Drink enough fluid to keep your pee (urine) clear or pale yellow.  Eat soups and other clear broths.  Have a healthy diet.  Rest as needed.  Go back to work when your fever is gone or your doctor says it is okay. ? You may need to stay home longer to avoid giving your URI to others. ? You can also wear a face mask and wash your hands often to prevent spread of the virus.  Use your inhaler more if you have asthma.  Do not use any tobacco products, including cigarettes, chewing tobacco, or electronic cigarettes. If you need help quitting, ask your doctor. Contact a doctor if:  You are getting worse, not better.  Your symptoms are not helped by medicine.  You have chills.  You are getting more short of breath.  You have brown or red mucus.  You have yellow or brown discharge from your nose.  You have pain in your face, especially when you bend forward.  You have a fever.  You have puffy (swollen) neck glands.  You have pain while swallowing.  You have white areas in the back of your throat. Get help right away if:  You have very bad or constant: ? Headache. ? Ear pain. ? Pain in your forehead, behind your eyes, and over your cheekbones (sinus pain). ? Chest pain.  You have long-lasting (chronic) lung disease and any of the following: ? Wheezing. ? Long-lasting cough. ? Coughing up blood. ? A change in your usual mucus.  You have a stiff neck.  You have  changes in your: ? Vision. ? Hearing. ? Thinking. ? Mood. This information is not intended to replace advice given to you by your health care provider. Make sure you discuss any questions you have with your health care provider. Document Released: 07/07/2007 Document Revised: 09/21/2015 Document Reviewed: 04/25/2013 Elsevier Interactive Patient Education  2018 [employer] Chronic Obstructive Pulmonary Disease Chronic obstructive pulmonary disease (COPD) is a long-term (chronic) lung problem. When you have COPD, it is hard for air to get in and out of your lungs. The way your lungs work will never return to normal. Usually the condition gets worse over time. There are things you can do to keep yourself as healthy as possible. Your doctor may treat your condition with:  Medicines.  Quitting smoking, if you smoke.  Rehabilitation. This may involve a team of specialists.  Oxygen.  Exercise and changes to your diet.  Lung surgery.  Comfort measures (palliative care).  Follow these instructions at home: Medicines  Take over-the-counter and prescription medicines only as told by your doctor.  Talk to your doctor before taking any cough or allergy medicines. You may need to avoid medicines that cause your lungs to be dry. Lifestyle  If you smoke, stop. Smoking makes the problem worse. If you need help quitting, ask your doctor.  Avoid being around things that make your breathing worse. This may include smoke, chemicals,  and fumes.  Stay active, but remember to also rest.  Learn and use tips on how to relax.  Make sure you get enough sleep. Most adults need at least 7 hours a night.  Eat healthy foods. Eat smaller meals more often. Rest before meals. Controlled breathing  Learn and use tips on how to control your breathing as told by your doctor. Try: ? Breathing in (inhaling) through your nose for 1 second. Then, pucker your lips and breath out (exhale) through your lips for  2 seconds. ? Putting one hand on your belly (abdomen). Breathe in slowly through your nose for 1 second. Your hand on your belly should move out. Pucker your lips and breathe out slowly through your lips. Your hand on your belly should move in as you breathe out. Controlled coughing  Learn and use controlled coughing to clear mucus from your lungs. The steps are: 1. Lean your head a little forward. 2. Breathe in deeply. 3. Try to hold your breath for 3 seconds. 4. Keep your mouth slightly open while coughing 2 times. 5. Spit any mucus out into a tissue. 6. Rest and do the steps again 1 or 2 times as needed. General instructions  Make sure you get all the shots (vaccines) that your doctor recommends. Ask your doctor about a flu shot and a pneumonia shot.  Use oxygen therapy and therapy to help improve your lungs (pulmonary rehabilitation) if told by your doctor. If you need home oxygen therapy, ask your doctor if you should buy a tool to measure your oxygen level (oximeter).  Make a COPD action plan with your doctor. This helps you know what to do if you feel worse than usual.  Manage any other conditions you have as told by your doctor.  Avoid going outside when it is very hot, cold, or humid.  Avoid people who have a sickness you can catch (contagious).  Keep all follow-up visits as told by your doctor. This is important. Contact a doctor if:  You cough up more mucus than usual.  There is a change in the color or thickness of the mucus.  It is harder to breathe than usual.  Your breathing is faster than usual.  You have trouble sleeping.  You need to use your medicines more often than usual.  You have trouble doing your normal activities such as getting dressed or walking around the house. Get help right away if:  You have shortness of breath while resting.  You have shortness of breath that stops you from: ? Being able to talk. ? Doing normal activities.  Your chest  hurts for longer than 5 minutes.  Your skin color is more blue than usual.  Your pulse oximeter shows that you have low oxygen for longer than 5 minutes.  You have a fever.  You feel too tired to breathe normally. Summary  Chronic obstructive pulmonary disease (COPD) is a long-term lung problem.  The way your lungs work will never return to normal. Usually the condition gets worse over time. There are things you can do to keep yourself as healthy as possible.  Take over-the-counter and prescription medicines only as told by your doctor.  If you smoke, stop. Smoking makes the problem worse. This information is not intended to replace advice given to you by your health care provider. Make sure you discuss any questions you have with your health care provider. Document Released: 07/07/2007 Document Revised: 06/26/2015 Document Reviewed: 09/14/2012 Elsevier Interactive Patient Education  2017 [employer] Inc.  

## 2018-01-09 NOTE — Progress Notes (Signed)
Subjective:     Patient ID: Douglas Monroe , male    DOB: 09-Mar-1932 , 84 y.o.   MRN: [id number]   Chief Complaint  Patient presents with  . URI    deep coughing,runny nose no fever no sore throat    HPI  URI   This is a new problem. The current episode started yesterday. The problem has been gradually worsening. There has been no fever. Associated symptoms include coughing (nonproductive cough). Pertinent negatives include no abdominal pain, chest pain, congestion, headaches, nausea, rhinorrhea or sore throat. He has tried nothing (theraflu) for the symptoms. The treatment provided moderate relief.     Past Medical History:  Diagnosis Date  . Abdominal aortic aneurysm ([place])    infrarenal -- monitored by dr berry (note states stable 4.0 to 4.1cm)  . Allergy to alpha-gal    per aptient and wife; reports he was diagnosed in spring 2019 after a bite form possibly a chigger or tick ;    . Anxiety    situational  . Arthritis    "hands" (03/15/2016)  . Cancer of skin of temple 2017   right  . COPD with emphysema ([place])   . Dyspnea    with exertion  . Exertional dyspnea   . First degree heart block   . GERD (gastroesophageal reflux disease)   . Headache(784.0)    uses goody powder; "maybe weekly, maybe not that much" (03/15/2016)  . History of kidney stones   . HOH (hard of hearing)    left ear; no hearing aid   . HTN (hypertension) 08/20/2011  . Hyperlipidemia   . Hypertension   . Ischemic colitis ([place])   . Left carotid artery stenosis    moderate  . Migraine    "long time ago" (03/15/2016)  . 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 PVD (peripheral vascular disease) with claudication [facility]) cardiologist-  dr berry   s/p bilateral iliac stents and right sfa stenting/  doppper study 09-05-2012  revealed ABIs close to one bilaterally with patent stents/  stenting left renal artery stenosis   . Right ureteral stone   . S/P arterial stent    left renal  angioplasty and stent 11-02-2012  . Sleep apnea    at risk for OSA. stop/bang score= 6; sent to PCP 10/04/13; denies use of mask on 03/15/2016  . Tubular adenoma of colon   . Urticaria      Family History  Problem Relation Age of Onset  . Hypertension Mother   . Cancer Father   . Diabetes Brother   . Hypertension Son      Current Outpatient Medications:  .  albuterol (PROVENTIL HFA;VENTOLIN HFA) 108 (90 Base) MCG/ACT inhaler, Inhale 2 puffs into the lungs every 6 (six) hours as needed for wheezing or shortness of breath., Disp: , Rfl:  .  albuterol (PROVENTIL) (2.5 MG/3ML) 0.083% nebulizer solution, Take 2.5 mg by nebulization every 6 (six) hours as needed for wheezing or shortness of breath., Disp: , Rfl:  .  amLODipine (NORVASC) 5 MG tablet, Take 5 mg by mouth daily. , Disp: , Rfl:  .  Aspirin-Acetaminophen-Caffeine (GOODY HEADACHE PO), Take 1 packet by mouth daily as needed (for headache)., Disp: , Rfl:  .  atorvastatin (LIPITOR) 20 MG tablet, TAKE 1 TABLET BY MOUTH  DAILY AT 6 PM. (Patient taking differently: Take 20 mg by mouth every morning. Patient takes in the morning), Disp: 90 tablet, Rfl: 3 .  BYSTOLIC  10 MG tablet, Take 10 mg by mouth daily., Disp: , Rfl:  .  clopidogrel (PLAVIX) 75 MG tablet, TAKE 1 TABLET BY MOUTH  EVERY DAY (Patient taking differently: Take 75 mg by mouth daily. ), Disp: 90 tablet, Rfl: 3 .  [employer] D3 2000 units CAPS, Take 2,000 Units by mouth daily. , Disp: , Rfl: 5 .  [employer] MAGNESIUM OXIDE 500 MG TABS, TAKE 1 TABLET BY MOUTH EVERY DAY, Disp: 30 tablet, Rfl: 4 .  EPINEPHrine 0.3 mg/0.3 mL IJ SOAJ injection, Inject 0.3 mg into the muscle daily as needed (allergic reaction). , Disp: , Rfl: 1 .  finasteride (PROSCAR) 5 MG tablet, Take 5 mg by mouth daily. , Disp: , Rfl:  .  Fluticasone-Umeclidin-Vilant (TRELEGY ELLIPTA) 100-62.5-25 MCG/INH AEPB, Inhale 1 puff into the lungs daily., Disp: 60 each, Rfl: 6 .  loratadine (CLARITIN) 10 MG tablet, Take 10 mg by mouth  daily., Disp: , Rfl:  .  pantoprazole (PROTONIX) 40 MG tablet, TAKE 1 TABLET BY MOUTH EVERY DAY, Disp: 90 tablet, Rfl: 1 .  ranitidine (ZANTAC) 150 MG tablet, TAKE 1 TABLET BY MOUTH TWICE A DAY, Disp: 60 tablet, Rfl: 3 .  tamsulosin (FLOMAX) 0.4 MG CAPS capsule, Take 0.4 mg by mouth daily. , Disp: , Rfl:    Allergies  Allergen Reactions  . Meat Extract Other (See Comments)    Alpha gal, red meat allergy   . Pneumococcal Vaccines Swelling and Other (See Comments)    Lip swelling     Review of Systems  Constitutional: Negative.   HENT: Negative for congestion, rhinorrhea, sinus pressure and sore throat.   Eyes: Negative.   Respiratory: Positive for cough (nonproductive cough).   Cardiovascular: Negative for chest pain, palpitations and leg swelling.  Gastrointestinal: Negative for abdominal pain and nausea.  Neurological: Negative for headaches.     Today's Vitals   01/09/18 1213  BP: 130/68  Pulse: 75  Temp: (!) 97.5 F (36.4 C)  TempSrc: Oral  SpO2: 93%  Weight: 156 lb 12.8 oz (71.1 kg)  Height: 5\' 4"  (1.626 m)  PainSc: 0-No pain   Body mass index is 26.91 kg/m.   Objective:  Physical Exam  Constitutional: He is oriented to person, place, and time. He appears well-developed and well-nourished.  Cardiovascular: Normal rate, regular rhythm and normal heart sounds.  Pulmonary/Chest: Effort normal. No respiratory distress. He has decreased breath sounds in the right upper field, the right middle field, the right lower field, the left upper field, the left middle field and the left lower field. He has no wheezes. He has no rhonchi. He has no rales.  Neurological: He is alert and oriented to person, place, and time.  Skin: Skin is warm and dry.        Assessment And Plan:     1. Chronic obstructive pulmonary disease, unspecified COPD type ([place])  Continue with your Trelogy  Will treat with prednisone taper, due to decreased bilateral breath sounds and hacking couugh -  predniSONE (STERAPRED UNI-PAK 21 TAB) 10 MG (21) TBPK tablet; Take as directed  Dispense: 21 tablet; Refill: 0 - amoxicillin (AMOXIL) 875 MG tablet; Take 1 tablet (875 mg total) by mouth 2 (two) times daily.  Dispense: 14 tablet; Refill: 0  2. Upper respiratory tract infection, unspecified type  Will treat with amoxicillin 875 mg BID for 7 days - predniSONE (STERAPRED UNI-PAK 21 TAB) 10 MG (21) TBPK tablet; Take as directed  Dispense: 21 tablet; Refill: 0 - amoxicillin (AMOXIL) 875  MG tablet; Take 1 tablet (875 mg total) by mouth 2 (two) times daily.  Dispense: 14 tablet; Refill: 0       Minette Brine, FNP

## 2018-01-11 ENCOUNTER — Emergency Department (HOSPITAL_COMMUNITY): Payer: Medicare Other

## 2018-01-11 ENCOUNTER — Encounter (HOSPITAL_COMMUNITY): Payer: Self-pay

## 2018-01-11 ENCOUNTER — Emergency Department (HOSPITAL_COMMUNITY)
Admission: EM | Admit: 2018-01-11 | Discharge: 2018-01-11 | Disposition: A | Discharge status: Home / Self Care | Payer: Medicare Other | Attending: Emergency Medicine | Admitting: Emergency Medicine

## 2018-01-11 ENCOUNTER — Other Ambulatory Visit: Payer: Self-pay

## 2018-01-11 DIAGNOSIS — F419 Anxiety disorder, unspecified: Secondary | ICD-10-CM | POA: Insufficient documentation

## 2018-01-11 DIAGNOSIS — I2510 Atherosclerotic heart disease of native coronary artery without angina pectoris: Secondary | ICD-10-CM | POA: Insufficient documentation

## 2018-01-11 DIAGNOSIS — I129 Hypertensive chronic kidney disease with stage 1 through stage 4 chronic kidney disease, or unspecified chronic kidney disease: Secondary | ICD-10-CM | POA: Insufficient documentation

## 2018-01-11 DIAGNOSIS — J441 Chronic obstructive pulmonary disease with (acute) exacerbation: Secondary | ICD-10-CM | POA: Diagnosis not present

## 2018-01-11 DIAGNOSIS — Z85828 Personal history of other malignant neoplasm of skin: Secondary | ICD-10-CM | POA: Diagnosis not present

## 2018-01-11 DIAGNOSIS — Z87891 Personal history of nicotine dependence: Secondary | ICD-10-CM | POA: Insufficient documentation

## 2018-01-11 DIAGNOSIS — R0602 Shortness of breath: Secondary | ICD-10-CM | POA: Diagnosis present

## 2018-01-11 DIAGNOSIS — N182 Chronic kidney disease, stage 2 (mild): Secondary | ICD-10-CM | POA: Insufficient documentation

## 2018-01-11 DIAGNOSIS — J069 Acute upper respiratory infection, unspecified: Secondary | ICD-10-CM | POA: Diagnosis not present

## 2018-01-11 LAB — I-STAT TROPONIN, ED: Troponin i, poc: 0 ng/mL (ref 0.00–0.08)

## 2018-01-11 LAB — BASIC METABOLIC PANEL
ANION GAP: 9 (ref 5–15)
BUN: 21 mg/dL (ref 8–23)
CO2: 26 mmol/L (ref 22–32)
Calcium: 9.2 mg/dL (ref 8.9–10.3)
Chloride: 107 mmol/L (ref 98–111)
Creatinine, Ser: 1.16 mg/dL (ref 0.61–1.24)
GFR calc non Af Amer: 58 mL/min — ABNORMAL LOW (ref >60)
GLUCOSE: 111 mg/dL — AB (ref 70–99)
Potassium: 3.8 mmol/L (ref 3.5–5.1)
Sodium: 142 mmol/L (ref 135–145)

## 2018-01-11 LAB — CBC
HEMATOCRIT: 42.9 % (ref 39.0–52.0)
Hemoglobin: 13.5 g/dL (ref 13.0–17.0)
MCH: 29.3 pg (ref 26.0–34.0)
MCHC: 31.5 g/dL (ref 30.0–36.0)
MCV: 93.1 fL (ref 80.0–100.0)
Platelets: 169 10*3/uL (ref 150–400)
RBC: 4.61 MIL/uL (ref 4.22–5.81)
RDW: 15.3 % (ref 11.5–15.5)
WBC: 12.5 10*3/uL — ABNORMAL HIGH (ref 4.0–10.5)
nRBC: 0 % (ref 0.0–0.2)

## 2018-01-11 MED ORDER — ALBUTEROL SULFATE (2.5 MG/3ML) 0.083% IN NEBU
2.5000 mg | INHALATION_SOLUTION | Freq: Once | RESPIRATORY_TRACT | Status: AC
  Administered 2018-01-11: 2.5 mg via RESPIRATORY_TRACT
  Filled 2018-01-11: qty 3

## 2018-01-11 MED ORDER — HYDROCOD POLST-CPM POLST ER 10-8 MG/5ML PO SUER: 5.0000 mL | Freq: Two times a day (BID) | ORAL | 0 refills | Status: AC | PRN

## 2018-01-11 MED ORDER — HYDROCOD POLST-CPM POLST ER 10-8 MG/5ML PO SUER
5.0000 mL | Freq: Once | ORAL | Status: AC
  Administered 2018-01-11: 5 mL via ORAL
  Filled 2018-01-11: qty 5

## 2018-01-11 NOTE — ED Triage Notes (Signed)
Pt has hx of COPD . Pt was recently seen at PCP on Monday, and was given amoxicillin and steroids. However, pt woke up this morning short of breath. Pt's wife is concerned that the pt has pneumonia.

## 2018-01-11 NOTE — ED Notes (Signed)
Bed: WA17 Expected date:  Expected time:  Means of arrival:  Comments: Triage 2  

## 2018-01-11 NOTE — Discharge Instructions (Addendum)
Douglas Monroe,   I am sorry that you are feeling poorly. We have checked some labs and did an chest x-ray that was unremarkable. There is not pneumonia seen on x-ray. This appears to be a COPD exacerbation and a upper respiratory infection. Please continue to take the amoxicillin and prednisone course that you were prescribed.   I have prescribed you some Tussionex, please only take 5 mL every 12 hours as needed. Taking too much of this medication can make you sleepy and put you at risks for a fall so please stop taking this if you start developing these symptoms.   If your symptoms worsen or do not improve with the treatment or you develop any new symptoms please return to the ED to be evaluated.

## 2018-01-11 NOTE — ED Provider Notes (Signed)
[facility] DEPT Provider Note   CSN: [id number] Arrival date & time: 01/11/18  1037   History   Chief Complaint Chief Complaint  Patient presents with  . Shortness of Breath  . Cough   HPI Douglas Monroe is a 84 y.o. male.  This is a 83 year old male history of COPD, hypertension, GERD, PVD, and AAA stenting with worsening shortness of breath, dry cough, and difficulty breathing.  Reports that he is been having this for a while and actually saw his primary care physician on Monday and was prescribed amoxicillin and steroids. He feels like his symptoms have been getting worse, today is his second day of prednisone and antibiotics.  He reports that he and went to let the dog out of the front door and he started coughing and had a lot of shortness of breath.  He is on trilogy, used an albuterol nebulizer yesterday and his inhaler twice yesterday.  He also has been using Robitussin for his cough which does not help.  He has never been on oxygen before. He denies any fevers, chills, nausea, vomiting.  He reports that he has never had pneumonia in the past.  He has been eating well.     Past Medical History:  Diagnosis Date  . Abdominal aortic aneurysm ([place])    infrarenal -- monitored by dr berry (note states stable 4.0 to 4.1cm)  . Allergy to alpha-gal    per aptient and wife; reports he was diagnosed in spring 2019 after a bite form possibly a chigger or tick ;    . Anxiety    situational  . Arthritis    "hands" (03/15/2016)  . Cancer of skin of temple 2017   right  . COPD with emphysema ([place])   . Dyspnea    with exertion  . Exertional dyspnea   . First degree heart block   . GERD (gastroesophageal reflux disease)   . Headache(784.0)    uses goody powder; "maybe weekly, maybe not that much" (03/15/2016)  . History of kidney stones   . HOH (hard of hearing)    left ear; no hearing aid   . HTN (hypertension) 08/20/2011  . Hyperlipidemia   .  Hypertension   . Ischemic colitis ([place])   . Left carotid artery stenosis    moderate  . Migraine    "long time ago" (03/15/2016)  . 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 PVD (peripheral vascular disease) with claudication [facility]) cardiologist-  dr berry   s/p bilateral iliac stents and right sfa stenting/  doppper study 09-05-2012  revealed ABIs close to one bilaterally with patent stents/  stenting left renal artery stenosis   . Right ureteral stone   . S/P arterial stent    left renal angioplasty and stent 11-02-2012  . Sleep apnea    at risk for OSA. stop/bang score= 6; sent to PCP 10/04/13; denies use of mask on 03/15/2016  . Tubular adenoma of colon   . Urticaria     Patient Active Problem List   Diagnosis Date Noted  . Hypertensive heart and renal disease 11/08/2017  . Hydronephrosis 05/08/2016  . Acute renal failure superimposed on stage 2 chronic kidney disease ([place]) 05/08/2016  . Polyp of cecum   . Polyp of transverse colon   . BRBPR (bright red blood per rectum)   . Hematochezia   . Ischemic colitis ([place])   . Ulceration, colon   . Left sided abdominal pain   .  Rectal bleeding 03/15/2016  . Acute hyperglycemia 03/15/2016  . COPD exacerbation ([place])   . Renal insufficiency   . PVD (peripheral vascular disease) ([place])   . Ureteral calculus 10/10/2013  . Hyperlipidemia 07/24/2013  . S/P arterial stent, to Lt renal art. 11/02/12 11/03/2012  . Carotid artery disease ([place]) 10/06/2012  . Renal artery stenosis, progression of disease 10/06/2012  . Abdominal aortic aneurysm ([place]) 10/06/2012  . Normal coronary arteries-  07/21/2012  . PVD (peripheral vascular disease) with claudication, previous stents to bilateral iliacs. Rt. SFA also.  08/20/2011  . Claudication of both lower extremities ([place]) 08/20/2011  . PVD S/P multiple PCIs 08/20/2011  . COPD (chronic obstructive pulmonary disease) ([place]) 08/20/2011  . HTN (hypertension) 08/20/2011    Past  Surgical History:  Procedure Laterality Date  . ABDOMINAL ANGIOGRAM  08/19/2011   Left common iliac artery at the ostium, 6x65mm ICAST covered stent, common femoral artery, 8x131mm Abbott absolute pro nitinol self-expanding stent and resulting in reduction of 80 and 90% stenosis to 0% residual  . ABDOMINAL ANGIOGRAM  10/08/2010   Mid-right SFA, 6x120 EV3 Protege nitinol self-expanding stent, resulting in reduction of CTO to 0% residual  . ABDOMINAL ANGIOGRAM  06/22/2010   Rt Common Femoral Artery-8x3 Absolute Pro Nitinol self-expanding stent-90% stenosis to 0% residual; Rt External Iliac Artery-9x3 Absolute Pro Nitinol self-expanding stent-70% stenosis to 0% residual; Left External Iliac Artery-8x4 Absolute Pro Nitinol self-expanding stent-90% to 0% residual  . ATHERECTOMY N/A 08/19/2011   Procedure: ATHERECTOMY;  Surgeon: Lorretta Harp, MD;  Location[facility] CATH LAB;  Service: Cardiovascular;  Laterality: N/A;  . BACK SURGERY    . CARDIAC CATHETERIZATION  10/26/2002  dr Tami Ribas   normal coronary arteries/ normal  LVF  . CARDIOVASCULAR STRESS TEST  06-12-2010   dr berry   normal perfusion study/ no ischemia/  ef 69%  . COLONOSCOPY WITH PROPOFOL N/A 03/19/2016   Procedure: COLONOSCOPY WITH PROPOFOL;  Surgeon: Mauri Pole, MD;  Location: [facility];  Service: Endoscopy;  Laterality: N/A;  was inpatient, but got d/c'd home and is coming back as an OP  . CYSTOSCOPY W/ URETERAL STENT PLACEMENT Left 06/07/2016   Procedure: CYSTOSCOPY WITH RETROGRADE PYELOGRAM/URETERAL STENT REPLACEMENT;  Surgeon: Cleon Gustin, MD;  Location: [facility];  Service: Urology;  Laterality: Left;  . CYSTOSCOPY W/ URETERAL STENT PLACEMENT Left 09/20/2016   Procedure: CYSTOSCOPY WITH RETROGRADE PYELOGRAM/URETERAL STENT PLACEMENT;  Surgeon: Cleon Gustin, MD;  Location: [facility];  Service: Urology;  Laterality: Left;  . CYSTOSCOPY WITH RETROGRADE PYELOGRAM, URETEROSCOPY AND STENT PLACEMENT Right 10/10/2013   Procedure:  uretheral dilation, CYSTOSCOPY, URETEROSCOPY, stone extraction, STENT PLACEMENT;  Surgeon: [name], MD;  Location: [facility];  Service: Urology;  Laterality: Right;  . CYSTOSCOPY WITH RETROGRADE PYELOGRAM, URETEROSCOPY AND STENT PLACEMENT Left 02/08/2017   Procedure: CYSTOSCOPY WITH RETROGRADE PYELOGRAM, URETEROSCOPY;  Surgeon: Cleon Gustin, MD;  Location: [facility];  Service: Urology;  Laterality: Left;  . CYSTOSCOPY WITH RETROGRADE PYELOGRAM, URETEROSCOPY AND STENT PLACEMENT Left 05/16/2017   Procedure: CYSTOSCOPY WITH RETROGRADE PYELOGRAM, URETEROSCOPY WITH ABLATION OF URETERAL TUMOR, BLADDER BIOPSY AND FULGURATION, AND STENT PLACEMENT;  Surgeon: Cleon Gustin, MD;  Location: [facility];  Service: Urology;  Laterality: Left;  . CYSTOSCOPY WITH RETROGRADE PYELOGRAM, URETEROSCOPY AND STENT PLACEMENT Left 06/13/2017   Procedure: CYSTOSCOPY WITH RETROGRADE PYELOGRAM, URETEROSCOPY STENT EXCHANGE;  Surgeon: Cleon Gustin, MD;  Location: [facility];  Service: Urology;  Laterality: Left;  . CYSTOSCOPY WITH RETROGRADE PYELOGRAM, URETEROSCOPY AND  STENT PLACEMENT Left 10/31/2017   Procedure: CYSTOSCOPY WITH RETROGRADE PYELOGRAM, URETEROSCOPY ,FULGURATION OF URETERAL TUMOR AND STENT PLACEMENT;  Surgeon: Cleon Gustin, MD;  Location: [facility];  Service: Urology;  Laterality: Left;  1 HR POSSIBLE STENT PLACEMENT, ALSO POSSIBLE TUMOR ABLATION  . CYSTOSCOPY WITH URETEROSCOPY Left 05/09/2016   Procedure: CYSTOSCOPY, RETROGRADE, LEFT URETEROSCOPY, LEFT URETERAL BIOPSIES, HOLMIUM LASER FULGERATION, LEFT URETERAL STENT PLACEMENT;  Surgeon: Irine Seal, MD;  Location: [facility];  Service: Urology;  Laterality: Left;  . CYSTOSCOPY/URETEROSCOPY/HOLMIUM LASER Left 09/20/2016   Procedure: CYSTOSCOPY/URETEROSCOPY/HOLMIUM LASER;  Surgeon: Cleon Gustin, MD;  Location: [facility];  Service: Urology;  Laterality: Left;  . HOLMIUM LASER APPLICATION Right 08/02/6201   Procedure: HOLMIUM LASER APPLICATION;   Surgeon: [name], MD;  Location: [facility];  Service: Urology;  Laterality: Right;  . HOLMIUM LASER APPLICATION Left 06/06/9739   Procedure: TUMOR ABLATION WITH HOLMIUM LASER;  Surgeon: Cleon Gustin, MD;  Location: [facility];  Service: Urology;  Laterality: Left;  . LUMBAR DISC SURGERY  1970's  . RENAL ANGIOGRAM N/A 11/02/2012   Procedure: RENAL ANGIOGRAM;  Surgeon: Lorretta Harp, MD;  Location: [facility] CATH LAB;  Service: Cardiovascular;  Laterality: N/A;  . RENAL ARTERY STENT Left 11/02/2012  . SKIN CANCER EXCISION Right 2017   temple  . SOFT TISSUE CYST EXCISION  1960's   "outside part of lung LLL"  . TRANSURETHRAL RESECTION OF BLADDER TUMOR N/A 06/13/2017   Procedure: TRANSURETHRAL RESECTION OF BLADDER TUMOR (TURBT);  Surgeon: Cleon Gustin, MD;  Location: [facility];  Service: Urology;  Laterality: N/A;       Home Medications    Prior to Admission medications   Medication Sig Start Date End Date Taking? Authorizing Provider  albuterol (PROVENTIL HFA;VENTOLIN HFA) 108 (90 Base) MCG/ACT inhaler Inhale 2 puffs into the lungs every 6 (six) hours as needed for wheezing or shortness of breath.   Yes [provider]  albuterol (PROVENTIL) (2.5 MG/3ML) 0.083% nebulizer solution Take 2.5 mg by nebulization every 6 (six) hours as needed for wheezing or shortness of breath.   Yes [provider]  amLODipine (NORVASC) 5 MG tablet Take 5 mg by mouth daily.    Yes [provider]  amoxicillin (AMOXIL) 875 MG tablet Take 1 tablet (875 mg total) by mouth 2 (two) times daily. 01/09/18  Yes Minette Brine, FNP  Aspirin-Acetaminophen-Caffeine (GOODY HEADACHE PO) Take 1 packet by mouth daily as needed (for headache).   Yes [provider]  atorvastatin (LIPITOR) 20 MG tablet TAKE 1 TABLET BY MOUTH  DAILY AT 6 PM. Patient taking differently: Take 20 mg by mouth every morning. Patient takes in the morning 07/13/17  Yes Lorretta Harp, MD    BYSTOLIC 10 MG tablet Take 10 mg by mouth daily. 02/23/16  Yes [provider]  clopidogrel (PLAVIX) 75 MG tablet TAKE 1 TABLET BY MOUTH  EVERY DAY 07/13/17  Yes Lorretta Harp, MD  [employer] D3 2000 units CAPS Take 2,000 Units by mouth daily.  01/31/16  Yes [provider]  [employer] MAGNESIUM OXIDE 500 MG TABS TAKE 1 TABLET BY MOUTH EVERY DAY 11/21/17  Yes Glendale Chard, MD  finasteride (PROSCAR) 5 MG tablet Take 5 mg by mouth daily.    Yes [provider]  Fluticasone-Umeclidin-Vilant (TRELEGY ELLIPTA) 100-62.5-25 MCG/INH AEPB Inhale 1 puff into the lungs daily. 02/22/17  Yes Mannam, Praveen, MD  loratadine (CLARITIN) 10 MG tablet Take 10 mg by mouth daily.  Yes [provider]  pantoprazole (PROTONIX) 40 MG tablet TAKE 1 TABLET BY MOUTH EVERY DAY 11/04/17  Yes Glendale Chard, MD  predniSONE (STERAPRED UNI-PAK 21 TAB) 10 MG (21) TBPK tablet Take as directed 01/09/18  Yes Minette Brine, FNP  ranitidine (ZANTAC) 150 MG tablet TAKE 1 TABLET BY MOUTH TWICE A DAY 11/21/17  Yes Glendale Chard, MD  tamsulosin (FLOMAX) 0.4 MG CAPS capsule Take 0.4 mg by mouth daily.  07/13/13  Yes [provider]  chlorpheniramine-HYDROcodone (TUSSIONEX PENNKINETIC ER) 10-8 MG/5ML SUER Take 5 mLs by mouth every 12 (twelve) hours as needed for cough. 01/11/18   Asencion Noble, MD  EPINEPHrine 0.3 mg/0.3 mL IJ SOAJ injection Inject 0.3 mg into the muscle daily as needed (allergic reaction).  05/17/17   [provider]    Family History Family History  Problem Relation Age of Onset  . Hypertension Mother   . Cancer Father   . Diabetes Brother   . Hypertension Son     Social History Social History   Tobacco Use  . Smoking status: Former Smoker    Packs/day: 1.00    Years: 60.00    Pack years: 60.00    Types: Cigarettes    Last attempt to quit: 06/02/2010    Years since quitting: 7.6  . Smokeless tobacco: Never Used  Substance Use Topics  . Alcohol use: No  .  Drug use: No     Allergies   Meat extract and Pneumococcal vaccines   Review of Systems Review of Systems  Constitutional: Positive for appetite change. Negative for chills, fatigue and fever.  HENT: Positive for sneezing. Negative for sinus pressure and sinus pain.   Respiratory: Positive for cough, chest tightness, shortness of breath and wheezing.   Cardiovascular: Negative for chest pain and palpitations.  Gastrointestinal: Negative for abdominal pain, constipation, diarrhea, nausea and vomiting.  All other systems reviewed and are negative.    Physical Exam Updated Vital Signs BP 110/68   Pulse (!) 108   Temp 97.7 F (36.5 C) (Oral)   Resp 20   Ht 5\' 4"  (1.626 m)   Wt 71 kg   SpO2 97%   BMI 26.87 kg/m   Physical Exam  Constitutional: He is oriented to person, place, and time. He appears well-developed and well-nourished. He appears ill. No distress.  HENT:  Head: Normocephalic and atraumatic.  Mouth/Throat: Oropharynx is clear and moist.  Eyes: Pupils are equal, round, and reactive to light.  Neck: Normal range of motion. Neck supple.  Cardiovascular: Normal rate, regular rhythm and normal heart sounds.  Pulmonary/Chest: He is in respiratory distress. He has decreased breath sounds. He has wheezes (throughout lung fields).  Abdominal: Soft. Bowel sounds are normal.  Musculoskeletal:       Right lower leg: Normal.       Left lower leg: Normal.  Neurological: He is alert and oriented to person, place, and time.  Skin: Skin is warm and dry. Capillary refill takes less than 2 seconds.  Psychiatric: He has a normal mood and affect. His behavior is normal.     ED Treatments / Results  Labs (all labs ordered are listed, but only abnormal results are displayed) Labs Reviewed  CBC - Abnormal; Notable for the following components:      Result Value   WBC 12.5 (*)    All other components within normal limits  BASIC METABOLIC PANEL - Abnormal; Notable for the  following components:   Glucose, Bld 111 (*)  GFR calc non Af Amer 58 (*)    All other components within normal limits  I-STAT TROPONIN, ED    EKG EKG Interpretation  Date/Time:  Wednesday January 11 2018 10:52:03 EST Ventricular Rate:  84 PR Interval:    QRS Duration: 92 QT Interval:  374 QTC Calculation: 443 R Axis:   -73 Text Interpretation:  Sinus rhythm Prolonged PR interval Inferior infarct, old Confirmed by Quintella Reichert [phone]) on 01/11/2018 11:31:14 AM   Radiology Dg Chest 2 View  Result Date: 01/11/2018 CLINICAL DATA:  Cough and shortness of Breath EXAM: CHEST - 2 VIEW COMPARISON:  11/21/2016 FINDINGS: Cardiac shadows within normal limits. Mild scarring is noted in the left mid lung and left lung base. No focal infiltrate or sizable effusion is seen. No acute bony abnormality noted. IMPRESSION: Chronic scarring on the left.  No acute abnormality is noted. Electronically Signed   By: Inez Catalina M.D.   On: 01/11/2018 12:06    Procedures Procedures (including critical care time)  Medications Ordered in ED Medications  albuterol (PROVENTIL) (2.5 MG/3ML) 0.083% nebulizer solution 2.5 mg (2.5 mg Nebulization Given 01/11/18 1303)  chlorpheniramine-HYDROcodone (TUSSIONEX) 10-8 MG/5ML suspension 5 mL (5 mLs Oral Given 01/11/18 1416)  albuterol (PROVENTIL) (2.5 MG/3ML) 0.083% nebulizer solution 2.5 mg (2.5 mg Nebulization Given 01/11/18 1416)    Initial Impression / Assessment and Plan / ED Course  I have reviewed the triage vital signs and the nursing notes.  Pertinent labs & imaging results that were available during my care of the patient were reviewed by me and considered in my medical decision making (see chart for details).    This is an 84 year old male history of COPD, hypertension, GERD, PVD and AAA who presented with worsening shortness of breath, dry cough and difficulty breathing.  Diagnosed with an URI on Monday and started on prednisone and amoxicillin  which he started yesterday.  On arrival she was noted to be mildly tachypneic to 21 with O2 of 94%, normotensive, afebrile.  CBC was unremarkable, WBC was 12.5 however he is on prednisone.  CMP was unremarkable.  Chest x-ray showed no acute abnormalities. Patient was given a nebulizer which helped a little bit. Will provide another breathing treatment and cough medication. Will ambulate to make sure he doesn't become hypoxic.   With ambulation he remained at 93% on RA. This is likely a COPD exacerbation 2/2 URI. A PE, pneumothorax, or CHF are unlikely given his findings and symptoms. He already has antibiotics and steroids at home. Advised patient to continue this medication and prescribed him a course of Tussionex. Gave return precautions.   Final Clinical Impressions(s) / ED Diagnoses   Final diagnoses:  COPD exacerbation ([place])  Upper respiratory tract infection, unspecified type    ED Discharge Orders         Ordered    chlorpheniramine-HYDROcodone (TUSSIONEX PENNKINETIC ER) 10-8 MG/5ML SUER  Every 12 hours PRN     01/11/18 1458           Asencion Noble, MD 01/11/18 1516    Quintella Reichert, MD 01/13/18 1106

## 2018-01-11 NOTE — ED Notes (Signed)
Waiting for pharmacy to verify medication.  

## 2018-01-11 NOTE — ED Notes (Signed)
Patient ambulated down hall and back at 95% on room air. Once back to room patient at 93% while sitting. Patient sounds wheezy while sitting after walking.

## 2018-01-14 ENCOUNTER — Telehealth: Payer: Self-pay | Admitting: Cardiology

## 2018-01-14 NOTE — Telephone Encounter (Signed)
Pt's wife called on call answering service stating that patient woke up with worsening abdominal pain this morning.  He has been currently treated with amoxicillin for upper respiratory infection and reports his abdominal pain is worse with ongoing coughing.  His blood pressure is stable.  He he has no other associated symptoms.  He is currently pain-free.  He is a patient of Dr. Gwenlyn Found and has a known abdominal aortic aneurysm measuring 3.8 mm.  Instructions were given for patient to proceed to the emergency room or an urgent care center if pain worsens, blood pressure changes or if he has chest pain.  Otherwise instructions given for Tylenol and heating pad to the abdomen.  Kathyrn Drown NP-C [employer] Pager: [phone]

## 2018-01-16 ENCOUNTER — Other Ambulatory Visit: Payer: Self-pay | Admitting: Internal Medicine

## 2018-01-17 ENCOUNTER — Emergency Department (HOSPITAL_COMMUNITY): Payer: Medicare Other

## 2018-01-17 ENCOUNTER — Inpatient Hospital Stay (HOSPITAL_COMMUNITY)
Admission: EM | Admit: 2018-01-17 | Discharge: 2018-03-04 | DRG: 208 | Disposition: E | Payer: Medicare Other | Attending: Pulmonary Disease | Admitting: Pulmonary Disease

## 2018-01-17 ENCOUNTER — Encounter (HOSPITAL_COMMUNITY): Payer: Self-pay | Admitting: Emergency Medicine

## 2018-01-17 DIAGNOSIS — Z959 Presence of cardiac and vascular implant and graft, unspecified: Secondary | ICD-10-CM | POA: Diagnosis not present

## 2018-01-17 DIAGNOSIS — F419 Anxiety disorder, unspecified: Secondary | ICD-10-CM | POA: Diagnosis present

## 2018-01-17 DIAGNOSIS — M7981 Nontraumatic hematoma of soft tissue: Secondary | ICD-10-CM | POA: Diagnosis present

## 2018-01-17 DIAGNOSIS — Z7902 Long term (current) use of antithrombotics/antiplatelets: Secondary | ICD-10-CM

## 2018-01-17 DIAGNOSIS — Z515 Encounter for palliative care: Secondary | ICD-10-CM | POA: Diagnosis not present

## 2018-01-17 DIAGNOSIS — E875 Hyperkalemia: Secondary | ICD-10-CM | POA: Diagnosis not present

## 2018-01-17 DIAGNOSIS — Z8249 Family history of ischemic heart disease and other diseases of the circulatory system: Secondary | ICD-10-CM

## 2018-01-17 DIAGNOSIS — J441 Chronic obstructive pulmonary disease with (acute) exacerbation: Secondary | ICD-10-CM | POA: Diagnosis present

## 2018-01-17 DIAGNOSIS — Z95820 Peripheral vascular angioplasty status with implants and grafts: Secondary | ICD-10-CM | POA: Diagnosis not present

## 2018-01-17 DIAGNOSIS — H9190 Unspecified hearing loss, unspecified ear: Secondary | ICD-10-CM | POA: Diagnosis present

## 2018-01-17 DIAGNOSIS — R609 Edema, unspecified: Secondary | ICD-10-CM | POA: Diagnosis not present

## 2018-01-17 DIAGNOSIS — K219 Gastro-esophageal reflux disease without esophagitis: Secondary | ICD-10-CM | POA: Diagnosis present

## 2018-01-17 DIAGNOSIS — N182 Chronic kidney disease, stage 2 (mild): Secondary | ICD-10-CM | POA: Diagnosis present

## 2018-01-17 DIAGNOSIS — I701 Atherosclerosis of renal artery: Secondary | ICD-10-CM | POA: Diagnosis present

## 2018-01-17 DIAGNOSIS — M19041 Primary osteoarthritis, right hand: Secondary | ICD-10-CM | POA: Diagnosis present

## 2018-01-17 DIAGNOSIS — N401 Enlarged prostate with lower urinary tract symptoms: Secondary | ICD-10-CM | POA: Diagnosis present

## 2018-01-17 DIAGNOSIS — I779 Disorder of arteries and arterioles, unspecified: Secondary | ICD-10-CM | POA: Diagnosis present

## 2018-01-17 DIAGNOSIS — R739 Hyperglycemia, unspecified: Secondary | ICD-10-CM | POA: Diagnosis present

## 2018-01-17 DIAGNOSIS — Z9289 Personal history of other medical treatment: Secondary | ICD-10-CM

## 2018-01-17 DIAGNOSIS — G4730 Sleep apnea, unspecified: Secondary | ICD-10-CM | POA: Diagnosis present

## 2018-01-17 DIAGNOSIS — E785 Hyperlipidemia, unspecified: Secondary | ICD-10-CM | POA: Diagnosis present

## 2018-01-17 DIAGNOSIS — J181 Lobar pneumonia, unspecified organism: Secondary | ICD-10-CM

## 2018-01-17 DIAGNOSIS — E876 Hypokalemia: Secondary | ICD-10-CM

## 2018-01-17 DIAGNOSIS — D62 Acute posthemorrhagic anemia: Secondary | ICD-10-CM | POA: Diagnosis present

## 2018-01-17 DIAGNOSIS — R6521 Severe sepsis with septic shock: Secondary | ICD-10-CM | POA: Diagnosis not present

## 2018-01-17 DIAGNOSIS — Z4659 Encounter for fitting and adjustment of other gastrointestinal appliance and device: Secondary | ICD-10-CM

## 2018-01-17 DIAGNOSIS — Z9911 Dependence on respirator [ventilator] status: Secondary | ICD-10-CM

## 2018-01-17 DIAGNOSIS — Z452 Encounter for adjustment and management of vascular access device: Secondary | ICD-10-CM

## 2018-01-17 DIAGNOSIS — R0902 Hypoxemia: Secondary | ICD-10-CM

## 2018-01-17 DIAGNOSIS — J9601 Acute respiratory failure with hypoxia: Secondary | ICD-10-CM | POA: Diagnosis not present

## 2018-01-17 DIAGNOSIS — A419 Sepsis, unspecified organism: Secondary | ICD-10-CM | POA: Diagnosis not present

## 2018-01-17 DIAGNOSIS — R0602 Shortness of breath: Secondary | ICD-10-CM

## 2018-01-17 DIAGNOSIS — J449 Chronic obstructive pulmonary disease, unspecified: Secondary | ICD-10-CM | POA: Diagnosis present

## 2018-01-17 DIAGNOSIS — Z887 Allergy status to serum and vaccine status: Secondary | ICD-10-CM

## 2018-01-17 DIAGNOSIS — I129 Hypertensive chronic kidney disease with stage 1 through stage 4 chronic kidney disease, or unspecified chronic kidney disease: Secondary | ICD-10-CM | POA: Diagnosis present

## 2018-01-17 DIAGNOSIS — S301XXA Contusion of abdominal wall, initial encounter: Secondary | ICD-10-CM | POA: Diagnosis present

## 2018-01-17 DIAGNOSIS — I440 Atrioventricular block, first degree: Secondary | ICD-10-CM | POA: Diagnosis present

## 2018-01-17 DIAGNOSIS — J439 Emphysema, unspecified: Secondary | ICD-10-CM | POA: Diagnosis present

## 2018-01-17 DIAGNOSIS — R579 Shock, unspecified: Secondary | ICD-10-CM | POA: Diagnosis not present

## 2018-01-17 DIAGNOSIS — J9621 Acute and chronic respiratory failure with hypoxia: Secondary | ICD-10-CM | POA: Diagnosis present

## 2018-01-17 DIAGNOSIS — J189 Pneumonia, unspecified organism: Secondary | ICD-10-CM

## 2018-01-17 DIAGNOSIS — M19042 Primary osteoarthritis, left hand: Secondary | ICD-10-CM | POA: Diagnosis present

## 2018-01-17 DIAGNOSIS — Z85828 Personal history of other malignant neoplasm of skin: Secondary | ICD-10-CM | POA: Diagnosis not present

## 2018-01-17 DIAGNOSIS — K5900 Constipation, unspecified: Secondary | ICD-10-CM | POA: Diagnosis not present

## 2018-01-17 DIAGNOSIS — I739 Peripheral vascular disease, unspecified: Secondary | ICD-10-CM | POA: Diagnosis present

## 2018-01-17 DIAGNOSIS — Z87442 Personal history of urinary calculi: Secondary | ICD-10-CM

## 2018-01-17 DIAGNOSIS — R000 Tachycardia, unspecified: Secondary | ICD-10-CM | POA: Diagnosis not present

## 2018-01-17 DIAGNOSIS — R0603 Acute respiratory distress: Secondary | ICD-10-CM

## 2018-01-17 DIAGNOSIS — I10 Essential (primary) hypertension: Secondary | ICD-10-CM | POA: Diagnosis not present

## 2018-01-17 DIAGNOSIS — Z809 Family history of malignant neoplasm, unspecified: Secondary | ICD-10-CM

## 2018-01-17 DIAGNOSIS — J80 Acute respiratory distress syndrome: Secondary | ICD-10-CM | POA: Diagnosis not present

## 2018-01-17 DIAGNOSIS — Z833 Family history of diabetes mellitus: Secondary | ICD-10-CM

## 2018-01-17 DIAGNOSIS — N179 Acute kidney failure, unspecified: Secondary | ICD-10-CM | POA: Diagnosis not present

## 2018-01-17 DIAGNOSIS — N138 Other obstructive and reflux uropathy: Secondary | ICD-10-CM | POA: Diagnosis present

## 2018-01-17 DIAGNOSIS — I714 Abdominal aortic aneurysm, without rupture, unspecified: Secondary | ICD-10-CM | POA: Diagnosis present

## 2018-01-17 DIAGNOSIS — R140 Abdominal distension (gaseous): Secondary | ICD-10-CM

## 2018-01-17 DIAGNOSIS — J9600 Acute respiratory failure, unspecified whether with hypoxia or hypercapnia: Secondary | ICD-10-CM | POA: Diagnosis not present

## 2018-01-17 DIAGNOSIS — E874 Mixed disorder of acid-base balance: Secondary | ICD-10-CM | POA: Diagnosis not present

## 2018-01-17 DIAGNOSIS — B349 Viral infection, unspecified: Secondary | ICD-10-CM | POA: Diagnosis not present

## 2018-01-17 DIAGNOSIS — Z91018 Allergy to other foods: Secondary | ICD-10-CM

## 2018-01-17 DIAGNOSIS — Z87891 Personal history of nicotine dependence: Secondary | ICD-10-CM

## 2018-01-17 LAB — COMPREHENSIVE METABOLIC PANEL
ALT: 16 U/L (ref 0–44)
AST: 22 U/L (ref 15–41)
Albumin: 3.4 g/dL — ABNORMAL LOW (ref 3.5–5.0)
Alkaline Phosphatase: 89 U/L (ref 38–126)
Anion gap: 10 (ref 5–15)
BUN: 23 mg/dL (ref 8–23)
CO2: 25 mmol/L (ref 22–32)
Calcium: 8.6 mg/dL — ABNORMAL LOW (ref 8.9–10.3)
Chloride: 104 mmol/L (ref 98–111)
Creatinine, Ser: 1.14 mg/dL (ref 0.61–1.24)
GFR calc Af Amer: >60 mL/min (ref >60)
GFR calc non Af Amer: 59 mL/min — ABNORMAL LOW (ref >60)
Glucose, Bld: 113 mg/dL — ABNORMAL HIGH (ref 70–99)
Potassium: 3.2 mmol/L — ABNORMAL LOW (ref 3.5–5.1)
SODIUM: 139 mmol/L (ref 135–145)
Total Bilirubin: 1.4 mg/dL — ABNORMAL HIGH (ref 0.3–1.2)
Total Protein: 6.6 g/dL (ref 6.5–8.1)

## 2018-01-17 LAB — PROTIME-INR
INR: 0.89
Prothrombin Time: 12 seconds (ref 11.4–15.2)

## 2018-01-17 LAB — CBC WITH DIFFERENTIAL/PLATELET
ABS IMMATURE GRANULOCYTES: 0.12 10*3/uL — AB (ref 0.00–0.07)
BASOS ABS: 0 10*3/uL (ref 0.0–0.1)
Basophils Relative: 0 %
Eosinophils Absolute: 0.1 10*3/uL (ref 0.0–0.5)
Eosinophils Relative: 1 %
HCT: 35 % — ABNORMAL LOW (ref 39.0–52.0)
Hemoglobin: 11.1 g/dL — ABNORMAL LOW (ref 13.0–17.0)
Immature Granulocytes: 1 %
LYMPHS PCT: 12 %
Lymphs Abs: 1.2 10*3/uL (ref 0.7–4.0)
MCH: 28.7 pg (ref 26.0–34.0)
MCHC: 31.7 g/dL (ref 30.0–36.0)
MCV: 90.4 fL (ref 80.0–100.0)
Monocytes Absolute: 0.9 10*3/uL (ref 0.1–1.0)
Monocytes Relative: 9 %
Neutro Abs: 7.1 10*3/uL (ref 1.7–7.7)
Neutrophils Relative %: 77 %
Platelets: 187 10*3/uL (ref 150–400)
RBC: 3.87 MIL/uL — ABNORMAL LOW (ref 4.22–5.81)
RDW: 15.6 % — ABNORMAL HIGH (ref 11.5–15.5)
WBC: 9.5 10*3/uL (ref 4.0–10.5)
nRBC: 0 % (ref 0.0–0.2)

## 2018-01-17 LAB — HEMOGLOBIN AND HEMATOCRIT, BLOOD
HCT: 35.7 % — ABNORMAL LOW (ref 39.0–52.0)
Hemoglobin: 11.2 g/dL — ABNORMAL LOW (ref 13.0–17.0)

## 2018-01-17 LAB — TYPE AND SCREEN
ABO/RH(D): O POS
Antibody Screen: NEGATIVE

## 2018-01-17 LAB — BRAIN NATRIURETIC PEPTIDE: B Natriuretic Peptide: 82.8 pg/mL (ref 0.0–100.0)

## 2018-01-17 LAB — TROPONIN I: Troponin I: <0.03 ng/mL (ref <0.03)

## 2018-01-17 MED ORDER — ONDANSETRON HCL 4 MG PO TABS
4.0000 mg | ORAL_TABLET | Freq: Four times a day (QID) | ORAL | Status: DC | PRN
  Administered 2018-01-20: 4 mg via ORAL
  Filled 2018-01-17: qty 1

## 2018-01-17 MED ORDER — ATORVASTATIN CALCIUM 10 MG PO TABS
20.0000 mg | ORAL_TABLET | Freq: Every day | ORAL | Status: DC
  Administered 2018-01-18 – 2018-02-01 (×15): 20 mg via ORAL
  Filled 2018-01-17 (×2): qty 2
  Filled 2018-01-17 (×3): qty 1
  Filled 2018-01-17 (×10): qty 2

## 2018-01-17 MED ORDER — ALBUTEROL SULFATE HFA 108 (90 BASE) MCG/ACT IN AERS: 2.0000 | INHALATION_SPRAY | Freq: Four times a day (QID) | RESPIRATORY_TRACT | Status: DC | PRN

## 2018-01-17 MED ORDER — PANTOPRAZOLE SODIUM 40 MG PO TBEC
40.0000 mg | DELAYED_RELEASE_TABLET | Freq: Every day | ORAL | Status: DC
  Administered 2018-01-18 – 2018-01-19 (×2): 40 mg via ORAL
  Filled 2018-01-17 (×2): qty 1

## 2018-01-17 MED ORDER — ACETAMINOPHEN 325 MG PO TABS
650.0000 mg | ORAL_TABLET | Freq: Four times a day (QID) | ORAL | Status: DC | PRN
  Administered 2018-01-18 – 2018-01-20 (×3): 650 mg via ORAL
  Filled 2018-01-17 (×3): qty 2

## 2018-01-17 MED ORDER — FLUTICASONE-UMECLIDIN-VILANT 100-62.5-25 MCG/INH IN AEPB: 1.0000 | INHALATION_SPRAY | Freq: Every day | RESPIRATORY_TRACT | Status: DC

## 2018-01-17 MED ORDER — FINASTERIDE 5 MG PO TABS
5.0000 mg | ORAL_TABLET | Freq: Every day | ORAL | Status: DC
  Administered 2018-01-18 – 2018-02-01 (×15): 5 mg via ORAL
  Filled 2018-01-17 (×16): qty 1

## 2018-01-17 MED ORDER — VITAMIN D 25 MCG (1000 UNIT) PO TABS
2000.0000 [IU] | ORAL_TABLET | Freq: Every day | ORAL | Status: DC
  Administered 2018-01-17 – 2018-02-01 (×16): 2000 [IU] via ORAL
  Filled 2018-01-17 (×18): qty 2

## 2018-01-17 MED ORDER — BUDESONIDE 0.25 MG/2ML IN SUSP
0.2500 mg | Freq: Two times a day (BID) | RESPIRATORY_TRACT | Status: DC
  Administered 2018-01-18 – 2018-01-21 (×8): 0.25 mg via RESPIRATORY_TRACT
  Filled 2018-01-17 (×9): qty 2

## 2018-01-17 MED ORDER — IOPAMIDOL (ISOVUE-300) INJECTION 61%
100.0000 mL | Freq: Once | INTRAVENOUS | Status: AC | PRN
  Administered 2018-01-17: 100 mL via INTRAVENOUS

## 2018-01-17 MED ORDER — IPRATROPIUM-ALBUTEROL 0.5-2.5 (3) MG/3ML IN SOLN
3.0000 mL | Freq: Four times a day (QID) | RESPIRATORY_TRACT | Status: DC
  Administered 2018-01-17: 3 mL via RESPIRATORY_TRACT

## 2018-01-17 MED ORDER — GUAIFENESIN-DM 100-10 MG/5ML PO SYRP: 5.0000 mL | ORAL_SOLUTION | ORAL | Status: DC | PRN

## 2018-01-17 MED ORDER — MAGNESIUM OXIDE 400 (241.3 MG) MG PO TABS
400.0000 mg | ORAL_TABLET | Freq: Every day | ORAL | Status: DC
  Administered 2018-01-17 – 2018-02-01 (×16): 400 mg via ORAL
  Filled 2018-01-17 (×18): qty 1

## 2018-01-17 MED ORDER — IPRATROPIUM-ALBUTEROL 0.5-2.5 (3) MG/3ML IN SOLN
3.0000 mL | Freq: Once | RESPIRATORY_TRACT | Status: AC
  Administered 2018-01-17: 3 mL via RESPIRATORY_TRACT
  Filled 2018-01-17: qty 3

## 2018-01-17 MED ORDER — ALBUTEROL SULFATE (2.5 MG/3ML) 0.083% IN NEBU
2.5000 mg | INHALATION_SOLUTION | RESPIRATORY_TRACT | Status: DC | PRN
  Administered 2018-01-19 – 2018-02-01 (×10): 2.5 mg via RESPIRATORY_TRACT
  Filled 2018-01-17 (×11): qty 3

## 2018-01-17 MED ORDER — MORPHINE SULFATE (PF) 4 MG/ML IV SOLN
4.0000 mg | Freq: Once | INTRAVENOUS | Status: AC
  Administered 2018-01-17: 4 mg via INTRAVENOUS
  Filled 2018-01-17: qty 1

## 2018-01-17 MED ORDER — UMECLIDINIUM-VILANTEROL 62.5-25 MCG/INH IN AEPB
1.0000 | INHALATION_SPRAY | Freq: Every day | RESPIRATORY_TRACT | Status: DC
  Administered 2018-01-18 – 2018-01-21 (×4): 1 via RESPIRATORY_TRACT
  Filled 2018-01-17: qty 14

## 2018-01-17 MED ORDER — AMLODIPINE BESYLATE 5 MG PO TABS
5.0000 mg | ORAL_TABLET | Freq: Every day | ORAL | Status: DC
  Administered 2018-01-18 – 2018-01-20 (×3): 5 mg via ORAL
  Filled 2018-01-17 (×3): qty 1

## 2018-01-17 MED ORDER — NEBIVOLOL HCL 10 MG PO TABS
10.0000 mg | ORAL_TABLET | Freq: Every day | ORAL | Status: DC
  Administered 2018-01-18 – 2018-02-01 (×15): 10 mg via ORAL
  Filled 2018-01-17 (×15): qty 1

## 2018-01-17 MED ORDER — ONDANSETRON HCL 4 MG/2ML IJ SOLN
4.0000 mg | Freq: Four times a day (QID) | INTRAMUSCULAR | Status: DC | PRN
  Administered 2018-01-20: 4 mg via INTRAVENOUS
  Filled 2018-01-17 (×2): qty 2

## 2018-01-17 MED ORDER — HYDROCOD POLST-CPM POLST ER 10-8 MG/5ML PO SUER
5.0000 mL | Freq: Two times a day (BID) | ORAL | Status: DC
  Administered 2018-01-17 – 2018-02-01 (×30): 5 mL via ORAL
  Filled 2018-01-17 (×30): qty 5

## 2018-01-17 MED ORDER — ALBUTEROL SULFATE (2.5 MG/3ML) 0.083% IN NEBU
2.5000 mg | INHALATION_SOLUTION | Freq: Once | RESPIRATORY_TRACT | Status: AC
  Administered 2018-01-17: 2.5 mg via RESPIRATORY_TRACT
  Filled 2018-01-17: qty 3

## 2018-01-17 MED ORDER — METHYLPREDNISOLONE SODIUM SUCC 125 MG IJ SOLR
60.0000 mg | Freq: Three times a day (TID) | INTRAMUSCULAR | Status: DC
  Administered 2018-01-17 – 2018-01-24 (×19): 60 mg via INTRAVENOUS
  Filled 2018-01-17 (×19): qty 2

## 2018-01-17 MED ORDER — POTASSIUM CHLORIDE CRYS ER 20 MEQ PO TBCR
40.0000 meq | EXTENDED_RELEASE_TABLET | Freq: Once | ORAL | Status: AC
  Administered 2018-01-17: 40 meq via ORAL
  Filled 2018-01-17: qty 2

## 2018-01-17 MED ORDER — FAMOTIDINE 20 MG PO TABS
20.0000 mg | ORAL_TABLET | Freq: Every day | ORAL | Status: DC
  Administered 2018-01-18 – 2018-02-01 (×15): 20 mg via ORAL
  Filled 2018-01-17 (×16): qty 1

## 2018-01-17 MED ORDER — IOPAMIDOL (ISOVUE-300) INJECTION 61%
INTRAVENOUS | Status: AC
  Filled 2018-01-17: qty 100

## 2018-01-17 MED ORDER — TRAMADOL-ACETAMINOPHEN 37.5-325 MG PO TABS
1.0000 | ORAL_TABLET | Freq: Four times a day (QID) | ORAL | Status: DC | PRN
  Administered 2018-01-20 – 2018-01-27 (×3): 1 via ORAL
  Filled 2018-01-17 (×3): qty 1

## 2018-01-17 MED ORDER — LORATADINE 10 MG PO TABS
10.0000 mg | ORAL_TABLET | Freq: Every day | ORAL | Status: DC
  Administered 2018-01-18 – 2018-02-01 (×15): 10 mg via ORAL
  Filled 2018-01-17 (×16): qty 1

## 2018-01-17 MED ORDER — ACETAMINOPHEN 650 MG RE SUPP: 650.0000 mg | Freq: Four times a day (QID) | RECTAL | Status: DC | PRN

## 2018-01-17 MED ORDER — IPRATROPIUM-ALBUTEROL 0.5-2.5 (3) MG/3ML IN SOLN
3.0000 mL | Freq: Four times a day (QID) | RESPIRATORY_TRACT | Status: DC
  Administered 2018-01-18 – 2018-01-20 (×12): 3 mL via RESPIRATORY_TRACT
  Filled 2018-01-17 (×12): qty 3

## 2018-01-17 MED ORDER — MAGNESIUM OXIDE -MG SUPPLEMENT 500 MG PO TABS: 1.0000 | ORAL_TABLET | Freq: Every day | ORAL | Status: DC

## 2018-01-17 MED ORDER — SODIUM CHLORIDE (PF) 0.9 % IJ SOLN
INTRAMUSCULAR | Status: AC
  Filled 2018-01-17: qty 50

## 2018-01-17 MED ORDER — POLYETHYLENE GLYCOL 3350 17 G PO PACK: 17.0000 g | PACK | Freq: Every day | ORAL | Status: DC | PRN

## 2018-01-17 MED ORDER — DOXYCYCLINE HYCLATE 100 MG PO TABS
100.0000 mg | ORAL_TABLET | Freq: Two times a day (BID) | ORAL | Status: DC
  Administered 2018-01-17 – 2018-01-19 (×5): 100 mg via ORAL
  Filled 2018-01-17 (×5): qty 1

## 2018-01-17 MED ORDER — METHYLPREDNISOLONE SODIUM SUCC 125 MG IJ SOLR
125.0000 mg | Freq: Once | INTRAMUSCULAR | Status: AC
  Administered 2018-01-17: 125 mg via INTRAVENOUS
  Filled 2018-01-17: qty 2

## 2018-01-17 MED ORDER — TAMSULOSIN HCL 0.4 MG PO CAPS
0.4000 mg | ORAL_CAPSULE | Freq: Every day | ORAL | Status: DC
  Administered 2018-01-18 – 2018-02-01 (×15): 0.4 mg via ORAL
  Filled 2018-01-17 (×16): qty 1

## 2018-01-17 MED ORDER — IPRATROPIUM-ALBUTEROL 0.5-2.5 (3) MG/3ML IN SOLN
RESPIRATORY_TRACT | Status: AC
  Filled 2018-01-17: qty 3

## 2018-01-17 NOTE — ED Triage Notes (Signed)
Pt had bad coughing fit on Saturday and had bad pains on left side. Pt been using lidocaine patches for pain relief. Wife states she looked at it today and noticed bruising. Pt takes Plavix. Pt take Tussin for cough but per wife didn't take any yesterday.  Pt reports pain is worse with coughing or moving. Finished antibiotics and prednisone that was given at previous visit here.

## 2018-01-17 NOTE — H&P (Addendum)
TRH H&P   Patient Demographics:    Douglas Monroe, is a 84 y.o. male  MRN: [id number]   DOB - 1932-04-17  Admit Date - 01/14/2018  Outpatient Primary MD for the patient is Glendale Chard, MD  Referring MD: Dr. Ellender Hose  Outpatient Specialists:  Dr. Gwenlyn Found  Patient coming from: Home  Chief Complaint  Patient presents with  . Cough  . bruise on stomach      HPI:    Douglas Monroe  is a 84 y.o. male, with history of COPD, not on home O2 and not on chronic prednisone, history of peripheral vascular disease with extremity claudication status post iliac stenting (follows with Dr. Gwenlyn Found), hypertension, hyperlipidemia, migraine headaches who saw his PCP about 7 days back with nonproductive cough and wheezing.  He reports both he and his wife had URI symptoms 3 days prior to that.  His PCP gave him amoxicillin, prednisone taper and antitussives with suspicion for bronchitis.  He then presented to the ED 2 days later for persistent cough with wheezing.  He was given nebulizer and ambulated with O2 sat of 93% on room air.  He was instructed to continue his prednisone taper and antibiotic and discharged home.  However his wheezing and cough did not improve and has been coughing vigorously at home.  He has been taking his inhalers and albuterol nebulizer without much relief.  Denies any fevers, headache, chills, nausea, vomiting, dizziness, abdominal pain, dysuria or diarrhea.  Denies any tingling or numbness of the extremities.  He reports 2 days back when he was coughing vigorously he noticed a tearing sensation in his belly since then he has noticed some swelling in the abdomen and progressive bruising over the anterior abdomen and the left flank.  Denies any syncopal episode, fall or trauma to the abdomen.  Course in the ED  Vitals were stable except for O2 sat dropping down to 87-88% on room air.   Blood work showed WC of 9.5, hemoglobin of 11.1 (dropped from baseline of 13.5), normal platelets, potassium 3.2, normal renal function and LFTs. Chest x-ray negative for infiltrate.  CT of the abdomen done with findings of extensive bruising on exam showed moderate left rectus sheath hematoma without active arterial extravasation. Patient given IV Solu-Medrol, DuoNeb and hospitalist consulted for admission to telemetry.    Review of systems:    In addition to the HPI above,  No Fever-chills, No Headache, No changes with Vision or hearing, No problems swallowing food or Liquids, No Chest pain, cough + +, shortness of breath + + No abdominal pain, mild distention, bruising ++,  no Nausea or vomiting, Bowel movements are regular, No Blood in stool or Urine, No dysuria, urinary hesitancy + No new skin rashes or bruises, No new joints pains-aches,  No new weakness, tingling, numbness in any extremity, No recent weight gain or loss, No polyuria,  polydypsia or polyphagia, No significant Mental Stressors.     With Past History of the following :    Past Medical History:  Diagnosis Date  . Abdominal aortic aneurysm ([place])    infrarenal -- monitored by dr berry (note states stable 4.0 to 4.1cm)  . Allergy to alpha-gal    per aptient and wife; reports he was diagnosed in spring 2019 after a bite form possibly a chigger or tick ;    . Anxiety    situational  . Arthritis    "hands" (03/15/2016)  . Cancer of skin of temple 2017   right  . COPD with emphysema ([place])   . Dyspnea    with exertion  . Exertional dyspnea   . First degree heart block   . GERD (gastroesophageal reflux disease)   . Headache(784.0)    uses goody powder; "maybe weekly, maybe not that much" (03/15/2016)  . History of kidney stones   . HOH (hard of hearing)    left ear; no hearing aid   . HTN (hypertension) 08/20/2011  . Hyperlipidemia   . Hypertension   . Ischemic colitis ([place])   . Left carotid artery  stenosis    moderate  . Migraine    "long time ago" (03/15/2016)  . 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 PVD (peripheral vascular disease) with claudication [facility]) cardiologist-  dr berry   s/p bilateral iliac stents and right sfa stenting/  doppper study 09-05-2012  revealed ABIs close to one bilaterally with patent stents/  stenting left renal artery stenosis   . Right ureteral stone   . S/P arterial stent    left renal angioplasty and stent 11-02-2012  . Sleep apnea    at risk for OSA. stop/bang score= 6; sent to PCP 10/04/13; denies use of mask on 03/15/2016  . Tubular adenoma of colon   . Urticaria       Past Surgical History:  Procedure Laterality Date  . ABDOMINAL ANGIOGRAM  08/19/2011   Left common iliac artery at the ostium, 6x1mm ICAST covered stent, common femoral artery, 8x162mm Abbott absolute pro nitinol self-expanding stent and resulting in reduction of 80 and 90% stenosis to 0% residual  . ABDOMINAL ANGIOGRAM  10/08/2010   Mid-right SFA, 6x120 EV3 Protege nitinol self-expanding stent, resulting in reduction of CTO to 0% residual  . ABDOMINAL ANGIOGRAM  06/22/2010   Rt Common Femoral Artery-8x3 Absolute Pro Nitinol self-expanding stent-90% stenosis to 0% residual; Rt External Iliac Artery-9x3 Absolute Pro Nitinol self-expanding stent-70% stenosis to 0% residual; Left External Iliac Artery-8x4 Absolute Pro Nitinol self-expanding stent-90% to 0% residual  . ATHERECTOMY N/A 08/19/2011   Procedure: ATHERECTOMY;  Surgeon: Lorretta Harp, MD;  Location: [facility] CATH LAB;  Service: Cardiovascular;  Laterality: N/A;  . BACK SURGERY    . CARDIAC CATHETERIZATION  10/26/2002  dr Tami Ribas   normal coronary arteries/ normal  LVF  . CARDIOVASCULAR STRESS TEST  06-12-2010   dr berry   normal perfusion study/ no ischemia/  ef 69%  . COLONOSCOPY WITH PROPOFOL N/A 03/19/2016   Procedure: COLONOSCOPY WITH PROPOFOL;  Surgeon: Mauri Pole, MD;  Location: [place] ENDOSCOPY;   Service: Endoscopy;  Laterality: N/A;  was inpatient, but got d/c'd home and is coming back as an OP  . CYSTOSCOPY W/ URETERAL STENT PLACEMENT Left 06/07/2016   Procedure: CYSTOSCOPY WITH RETROGRADE PYELOGRAM/URETERAL STENT REPLACEMENT;  Surgeon: Cleon Gustin, MD;  Location: [facility];  Service: Urology;  Laterality: Left;  .  CYSTOSCOPY W/ URETERAL STENT PLACEMENT Left 09/20/2016   Procedure: CYSTOSCOPY WITH RETROGRADE PYELOGRAM/URETERAL STENT PLACEMENT;  Surgeon: Cleon Gustin, MD;  Location: [facility];  Service: Urology;  Laterality: Left;  . CYSTOSCOPY WITH RETROGRADE PYELOGRAM, URETEROSCOPY AND STENT PLACEMENT Right 10/10/2013   Procedure: uretheral dilation, CYSTOSCOPY, URETEROSCOPY, stone extraction, STENT PLACEMENT;  Surgeon: [name], MD;  Location: [facility];  Service: Urology;  Laterality: Right;  . CYSTOSCOPY WITH RETROGRADE PYELOGRAM, URETEROSCOPY AND STENT PLACEMENT Left 02/08/2017   Procedure: CYSTOSCOPY WITH RETROGRADE PYELOGRAM, URETEROSCOPY;  Surgeon: Cleon Gustin, MD;  Location: [facility];  Service: Urology;  Laterality: Left;  . CYSTOSCOPY WITH RETROGRADE PYELOGRAM, URETEROSCOPY AND STENT PLACEMENT Left 05/16/2017   Procedure: CYSTOSCOPY WITH RETROGRADE PYELOGRAM, URETEROSCOPY WITH ABLATION OF URETERAL TUMOR, BLADDER BIOPSY AND FULGURATION, AND STENT PLACEMENT;  Surgeon: Cleon Gustin, MD;  Location: [facility];  Service: Urology;  Laterality: Left;  . CYSTOSCOPY WITH RETROGRADE PYELOGRAM, URETEROSCOPY AND STENT PLACEMENT Left 06/13/2017   Procedure: CYSTOSCOPY WITH RETROGRADE PYELOGRAM, URETEROSCOPY STENT EXCHANGE;  Surgeon: Cleon Gustin, MD;  Location: [facility];  Service: Urology;  Laterality: Left;  . CYSTOSCOPY WITH RETROGRADE PYELOGRAM, URETEROSCOPY AND STENT PLACEMENT Left 10/31/2017   Procedure: CYSTOSCOPY WITH RETROGRADE PYELOGRAM, URETEROSCOPY ,FULGURATION OF URETERAL TUMOR AND STENT PLACEMENT;  Surgeon: Cleon Gustin, MD;  Location: [facility];  Service: Urology;  Laterality: Left;  1 HR POSSIBLE STENT PLACEMENT, ALSO POSSIBLE TUMOR ABLATION  . CYSTOSCOPY WITH URETEROSCOPY Left 05/09/2016   Procedure: CYSTOSCOPY, RETROGRADE, LEFT URETEROSCOPY, LEFT URETERAL BIOPSIES, HOLMIUM LASER FULGERATION, LEFT URETERAL STENT PLACEMENT;  Surgeon: Irine Seal, MD;  Location: [facility];  Service: Urology;  Laterality: Left;  . CYSTOSCOPY/URETEROSCOPY/HOLMIUM LASER Left 09/20/2016   Procedure: CYSTOSCOPY/URETEROSCOPY/HOLMIUM LASER;  Surgeon: Cleon Gustin, MD;  Location: [facility];  Service: Urology;  Laterality: Left;  . HOLMIUM LASER APPLICATION Right 03/12/9369   Procedure: HOLMIUM LASER APPLICATION;  Surgeon: [name], MD;  Location: [facility];  Service: Urology;  Laterality: Right;  . HOLMIUM LASER APPLICATION Left 07/11/6787   Procedure: TUMOR ABLATION WITH HOLMIUM LASER;  Surgeon: Cleon Gustin, MD;  Location: [facility];  Service: Urology;  Laterality: Left;  . LUMBAR DISC SURGERY  1970's  . RENAL ANGIOGRAM N/A 11/02/2012   Procedure: RENAL ANGIOGRAM;  Surgeon: Lorretta Harp, MD;  Location: [facility] CATH LAB;  Service: Cardiovascular;  Laterality: N/A;  . RENAL ARTERY STENT Left 11/02/2012  . SKIN CANCER EXCISION Right 2017   temple  . SOFT TISSUE CYST EXCISION  1960's   "outside part of lung LLL"  . TRANSURETHRAL RESECTION OF BLADDER TUMOR N/A 06/13/2017   Procedure: TRANSURETHRAL RESECTION OF BLADDER TUMOR (TURBT);  Surgeon: Cleon Gustin, MD;  Location: [facility];  Service: Urology;  Laterality: N/A;      Social History:     Social History   Tobacco Use  . Smoking status: Former Smoker    Packs/day: 1.00    Years: 60.00    Pack years: 60.00    Types: Cigarettes    Last attempt to quit: 06/02/2010    Years since quitting: 7.6  . Smokeless tobacco: Never Used  Substance Use Topics  . Alcohol use: No     Lives -home with wife  Mobility -independent     Family History :     Family History  Problem  Relation Age of Onset  . Hypertension Mother   . Cancer Father   . Diabetes Brother   .  Hypertension Son       Home Medications:   Prior to Admission medications   Medication Sig Start Date End Date Taking? Authorizing Provider  albuterol (PROVENTIL HFA;VENTOLIN HFA) 108 (90 Base) MCG/ACT inhaler Inhale 2 puffs into the lungs every 6 (six) hours as needed for wheezing or shortness of breath.   Yes [provider]  albuterol (PROVENTIL) (2.5 MG/3ML) 0.083% nebulizer solution Take 2.5 mg by nebulization every 6 (six) hours as needed for wheezing or shortness of breath.   Yes [provider]  amLODipine (NORVASC) 5 MG tablet TAKE 1 TABLET BY MOUTH  DAILY 01/16/18  Yes Glendale Chard, MD  Aspirin-Acetaminophen-Caffeine (GOODY HEADACHE PO) Take 1 packet by mouth daily as needed (for headache).   Yes [provider]  atorvastatin (LIPITOR) 20 MG tablet TAKE 1 TABLET BY MOUTH  DAILY AT 6 PM. Patient taking differently: Take 20 mg by mouth every morning. Patient takes in the morning 07/13/17  Yes Lorretta Harp, MD  BYSTOLIC 10 MG tablet Take 10 mg by mouth daily. 02/23/16  Yes [provider]  chlorpheniramine-HYDROcodone (TUSSIONEX PENNKINETIC ER) 10-8 MG/5ML SUER Take 5 mLs by mouth every 12 (twelve) hours as needed for cough. 01/11/18  Yes Asencion Noble, MD  clopidogrel (PLAVIX) 75 MG tablet TAKE 1 TABLET BY MOUTH  EVERY DAY 07/13/17  Yes Lorretta Harp, MD  CVS D3 2000 units CAPS Take 2,000 Units by mouth daily.  01/31/16  Yes [provider]  [employer]S MAGNESIUM OXIDE 500 MG TABS TAKE 1 TABLET BY MOUTH EVERY DAY 11/21/17  Yes Glendale Chard, MD  finasteride (PROSCAR) 5 MG tablet Take 5 mg by mouth daily.    Yes [provider]  Fluticasone-Umeclidin-Vilant (TRELEGY ELLIPTA) 100-62.5-25 MCG/INH AEPB Inhale 1 puff into the lungs daily. 02/22/17  Yes Mannam, Praveen, MD  loratadine (CLARITIN) 10 MG tablet Take 10 mg by mouth daily.   Yes  [provider]  pantoprazole (PROTONIX) 40 MG tablet TAKE 1 TABLET BY MOUTH EVERY DAY 11/04/17  Yes Glendale Chard, MD  ranitidine (ZANTAC) 150 MG tablet TAKE 1 TABLET BY MOUTH TWICE A DAY 11/21/17  Yes Glendale Chard, MD  tamsulosin (FLOMAX) 0.4 MG CAPS capsule Take 0.4 mg by mouth daily.  07/13/13  Yes [provider]  tiotropium (SPIRIVA) 18 MCG inhalation capsule Place 18 mcg into inhaler and inhale daily.   Yes [provider]  amoxicillin (AMOXIL) 875 MG tablet Take 1 tablet (875 mg total) by mouth 2 (two) times daily. Patient not taking: Reported on 01/29/2018 01/09/18   Minette Brine, FNP  EPINEPHrine 0.3 mg/0.3 mL IJ SOAJ injection Inject 0.3 mg into the muscle daily as needed (allergic reaction).  05/17/17   [provider]  predniSONE (STERAPRED UNI-PAK 21 TAB) 10 MG (21) TBPK tablet Take as directed Patient not taking: Reported on 01/29/2018 01/09/18   Minette Brine, FNP     Allergies:     Allergies  Allergen Reactions  . Meat Extract Other (See Comments)    Alpha gal, red meat allergy   . Pneumococcal Vaccines Swelling and Other (See Comments)    Lip swelling     Physical Exam:   Vitals  Blood pressure 117/66, pulse 92, temperature 98.1 F (36.7 C), temperature source Oral, resp. rate 18, SpO2 95 %.   General: Elderly male lying in bed, not in acute distress, fatigue HEENT: Pupils reactive bilateral, EOMI, no pallor, no icterus, moist mucosa, supple neck, no cervical adenopathy Chest: Scattered rhonchi with  right basilar crackles CVs: Normal S1-S2, no murmurs rub or gallop GI: Soft, mild distention, significant area of ecchymosis over the mid to lower abdomen, nontender, bowel sounds present Musculoskeletal: Warm, no edema CNS: Alert and oriented, nonfocal   Data Review:    CBC Recent Labs  Lab 01/11/18 1230 01/31/2018 1234  WBC 12.5* 9.5  HGB 13.5 11.1*  HCT 42.9 35.0*  PLT 169 187  MCV 93.1 90.4  MCH 29.3 28.7  MCHC 31.5  31.7  RDW 15.3 15.6*  LYMPHSABS  --  1.2  MONOABS  --  0.9  EOSABS  --  0.1  BASOSABS  --  0.0   ------------------------------------------------------------------------------------------------------------------  Chemistries  Recent Labs  Lab 01/11/18 1230 01/25/2018 1234  NA 142 139  K 3.8 3.2*  CL 107 104  CO2 26 25  GLUCOSE 111* 113*  BUN 21 23  CREATININE 1.16 1.14  CALCIUM 9.2 8.6*  AST  --  22  ALT  --  16  ALKPHOS  --  89  BILITOT  --  1.4*   ------------------------------------------------------------------------------------------------------------------ estimated creatinine clearance is 41.1 mL/min (by C-G formula based on SCr of 1.14 mg/dL). ------------------------------------------------------------------------------------------------------------------ No results for input(s): TSH, T4TOTAL, T3FREE, THYROIDAB in the last 72 hours.  Invalid input(s): FREET3  Coagulation profile Recent Labs  Lab 01/02/2018 1234  INR 0.89   ------------------------------------------------------------------------------------------------------------------- No results for input(s): DDIMER in the last 72 hours. -------------------------------------------------------------------------------------------------------------------  Cardiac Enzymes Recent Labs  Lab 01/24/2018 1234  TROPONINI <0.03   ------------------------------------------------------------------------------------------------------------------    Component Value Date/Time   BNP 82.8 01/16/2018 1234     ---------------------------------------------------------------------------------------------------------------  Urinalysis    Component Value Date/Time   COLORURINE AMBER (A) 05/08/2016 1432   APPEARANCEUR CLOUDY (A) 05/08/2016 1432   LABSPEC 1.016 05/08/2016 1432   PHURINE 5.0 05/08/2016 1432   GLUCOSEU NEGATIVE 05/08/2016 1432   HGBUR LARGE (A) 05/08/2016 1432   BILIRUBINUR NEGATIVE 05/08/2016 1432    KETONESUR NEGATIVE 05/08/2016 1432   PROTEINUR 100 (A) 05/08/2016 1432   UROBILINOGEN 0.2 04/24/2013 0238   NITRITE NEGATIVE 05/08/2016 1432   LEUKOCYTESUR NEGATIVE 05/08/2016 1432    ----------------------------------------------------------------------------------------------------------------   Imaging Results:    Dg Chest 2 View  Result Date: 01/26/2018 CLINICAL DATA:  Left lower chest and abdominal wall discomfort with bruising following a coughing paroxysm 3 days ago. History of COPD, former smoker. EXAM: CHEST - 2 VIEW COMPARISON:  PA and lateral chest x-ray of January 11, 2018 FINDINGS: The lungs are mildly hyperinflated. There is patchy density at the left lung base which is not new. The heart and pulmonary vascularity are normal. There is calcification in the wall of the aortic arch. There is stable biapical pleural thickening. The bony thorax exhibits no acute abnormality. IMPRESSION: Chronic bronchitic changes, stable. No alveolar pneumonia. The visualized portions of the ribs are normal. Thoracic aortic atherosclerosis. Electronically Signed   By: David  Martinique M.D.   On: 01/29/2018 13:04   Ct Abdomen Pelvis W Contrast  Result Date: 01/10/2018 CLINICAL DATA:  coughing fit on Saturday and had bad pains on left side. Pt been using lidocaine patches for pain relief. Wife states she looked at it today and noticed bruising. Pt takes Plavix. Pt take Tussin for cough but per wife didn't take any yesterday. Pt reports pain is worse with coughing or moving. Finished antibiotics and prednisone that was given at previous visit here. EXAM: CT ABDOMEN AND PELVIS WITH CONTRAST TECHNIQUE: Multidetector CT imaging of the abdomen and pelvis was performed using the standard  protocol following bolus administration of intravenous contrast. CONTRAST:  113mL ISOVUE-300 IOPAMIDOL (ISOVUE-300) INJECTION 61% COMPARISON:  05/08/2016 FINDINGS: Lower chest: Bronchiectasis and scarring in the visualized left  lower lobe as before. No pleural or pericardial effusion. Hepatobiliary: No focal liver abnormality is seen. No gallstones, gallbladder wall thickening, or biliary dilatation. Pancreas: Unremarkable. No pancreatic ductal dilatation or surrounding inflammatory changes. Spleen: Normal in size without focal abnormality. Adrenals/Urinary Tract: Normal adrenals. Benign appearing left renal cysts as before. No enhancing renal mass. No hydronephrosis. Urinary bladder physiologically distended, mildly thick-walled. Stomach/Bowel: Stomach is decompressed. Small bowel is nondilated. Normal appendix. The colon is nondilated, unremarkable. Vascular/Lymphatic: Calcified atheromatous plaque with extensive nonocclusive mural thrombus in the visualized descending thoracic, suprarenal and juxtarenal segments. Fusiform infrarenal aneurysm up to 3.5 cm transverse diameter (previously 3.4 cm 03/16/2016) with eccentric mildly occlusive mural thrombus, tapering to normal caliber above the bifurcation. Left renal artery ostial stent, patency indeterminate. Calcified ostial plaque involving the right renal artery resulting in high-grade stenosis or segmental occlusion. Heavily calcified ostial plaque and stenting through the length of the left common and external iliac arteries, patency indeterminate. Partially calcified plaque at the origin of the right common iliac artery resulting in stenosis of possible hemodynamic significance. Vascular stents through the right external iliac artery of indeterminate patency. Extensive partially calcified plaque through the common femoral arteries which are patent. No definite abdominal or pelvic adenopathy Reproductive: Moderate prostatic enlargement with coarse calcifications. Other: No ascites. No free air. Musculoskeletal: Moderate left rectus sheath hematoma without evidence of active arterial extravasation. Stable benign bone island in the left iliac bone near the SI joint. No fracture or  worrisome bone lesion. IMPRESSION: 1. Moderate left rectus sheath hematoma without active arterial extravasation. 2. 3.5 cm infrarenal abdominal aortic aneurysm, increased from 3.4 cm 03/16/2016. 3. Prostatic enlargement with thick-walled urinary bladder suggesting a degree of bladder outlet obstruction. 4. Extensive bilateral iliac arterial stents, of indeterminate patency. Correlate with clinical symptomatology and ABIs. Electronically Signed   By: Lucrezia Europe M.D.   On: 01/01/2018 15:11    My personal review of EKG: Pending   Assessment & Plan:    Principal Problem:   COPD with acute exacerbation ([place]) Acute respiratory failure with hypoxia ([facility]) Persistent symptoms despite being on a course of prednisone, antibiotic, no improvement with home inhaler and antitussives. Admit to telemetry.  Placed on IV Solu-Medrol 60 mg every 6 hours, scheduled DuoNeb and PRN albuterol neb.  Empiric doxycycline.  2 L O2 via nasal cannula, wean as tolerated and evaluate for home O2 need prior to discharge.  Supportive care with Tussionex and Robitussin as needed.  Active Problems: Rectus sheath hematoma with acute blood loss anemia Suspect secondary to vigorous coughing for the past 10 days.  No trauma.  Patient is on Plavix which will be held.  Also reports taking Goody powder frequently for headache which will be held.  Noted for almost 2 g drop in H&H.  Moderate left rectus sheath hematoma without active arterial extravasation. Monitor H&H every 6 hours .  If suspicion for infection or worsened hematoma with significant drop in H&H will need surgical/trauma consult. Pain control with PRN tramadol.  Serial abdominal exam.     PVD (peripheral vascular disease) with claudication, previous stents to bilateral iliacs. Rt. SFA also.  Follows with Dr. Alvester Chou.  Hold Plavix.  Continue statin.   Essential hypertension Stable.  Continue beta-blocker and amlodipine.    Abdominal aortic aneurysm ([place]) Noted for  slight increase in  size to 3.5 cm.  Follow as outpatient.  BPH symptoms with?  Bladder outlet obstruction on CT. Check bladder scan in the ED.  Resume Flomax and finasteride.  Hypokalemia Replenished  DVT Prophylaxis: SCD  AM Labs Ordered, also please review Full Orders  Family Communication: Admission, patients condition and plan of care including tests being ordered have been discussed with the patient and his wife at bedside  Code Status full code  Likely DC to home possibly in the next 72 hours if clinically improved  Condition: [place] called: None  Admission status: Inpatient  Patient presenting with persistent exacerbation of COPD with failed outpatient therapy, acute respiratory failure with hypoxia and rectus sheath hematoma with acute blood loss anemia. Patient needs to be monitored for >2 midnight for treatment of his acute respiratory failure with persistent COPD exacerbation and symptomatic anemia with rectus sheath hematoma.  Time spent in minutes : 60   Marsi Turvey M.D on 01/02/2018 at 4:28 PM  Between 7am to 7pm - Pager - [phone]. After 7pm go to [email or web address] - password [facility]  [facility] - Office  [phone]

## 2018-01-17 NOTE — ED Notes (Signed)
ED TO INPATIENT HANDOFF REPORT  Name/Age/Gender Douglas Monroe 84 y.o. male  Code Status Code Status History    Date Active Date Inactive Code Status Order ID Comments User Context   05/08/2016 1942 05/10/2016 1801 Full Code [id number]  Ivor Costa, MD ED   03/15/2016 0759 03/17/2016 1528 Full Code [id number]  Samella Parr, NP ED      Home/SNF/Other Home  Chief Complaint short of breath   Level of Care/Admitting Diagnosis ED Disposition    ED Disposition Condition [facility] Area: [facility] [100102]  Level of Care: Telemetry [5]  Admit to tele based on following criteria: Monitor for Ischemic changes  Diagnosis: Rectus sheath hematoma, initial encounter [324401]  Admitting Physician: Louellen Molder [phone]  Attending Physician: Louellen Molder [phone]  Estimated length of stay: past midnight tomorrow  Certification:: I certify this patient will need inpatient services for at least 2 midnights  PT Class (Do Not Modify): Inpatient [101]  PT Acc Code (Do Not Modify): Private [1]       Medical History Past Medical History:  Diagnosis Date  . Abdominal aortic aneurysm ([place])    infrarenal -- monitored by dr berry (note states stable 4.0 to 4.1cm)  . Allergy to alpha-gal    per aptient and wife; reports he was diagnosed in spring 2019 after a bite form possibly a chigger or tick ;    . Anxiety    situational  . Arthritis    "hands" (03/15/2016)  . Cancer of skin of temple 2017   right  . COPD with emphysema ([place])   . Dyspnea    with exertion  . Exertional dyspnea   . First degree heart block   . GERD (gastroesophageal reflux disease)   . Headache(784.0)    uses goody powder; "maybe weekly, maybe not that much" (03/15/2016)  . History of kidney stones   . HOH (hard of hearing)    left ear; no hearing aid   . HTN (hypertension) 08/20/2011  . Hyperlipidemia   . Hypertension   . Ischemic colitis ([place])   . Left carotid artery stenosis     moderate  . Migraine    "long time ago" (03/15/2016)  . 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 PVD (peripheral vascular disease) with claudication [facility]) cardiologist-  dr berry   s/p bilateral iliac stents and right sfa stenting/  doppper study 09-05-2012  revealed ABIs close to one bilaterally with patent stents/  stenting left renal artery stenosis   . Right ureteral stone   . S/P arterial stent    left renal angioplasty and stent 11-02-2012  . Sleep apnea    at risk for OSA. stop/bang score= 6; sent to PCP 10/04/13; denies use of mask on 03/15/2016  . Tubular adenoma of colon   . Urticaria     Allergies Allergies  Allergen Reactions  . Meat Extract Other (See Comments)    Alpha gal, red meat allergy   . Pneumococcal Vaccines Swelling and Other (See Comments)    Lip swelling    IV Location/Drains/Wounds Patient Lines/Drains/Airways Status   Active Line/Drains/Airways    Name:   Placement date:   Placement time:   Site:   Days:   Peripheral IV 01/03/2018 Left Antecubital   01/12/2018    1357    Antecubital   less than 1   Urethral Catheter dr. Alvino Chapel  Coude 18 Fr.   08/19/16  2322    Coude   516   Ureteral Drain/Stent Left ureter 6 Fr.   09/20/16    1353    Left ureter   484   Ureteral Drain/Stent Left ureter 6 Fr.   05/16/17    1215    (S) Left ureter   246   Ureteral Drain/Stent Left ureter 6 Fr.   06/13/17    1213    Left ureter   218   Ureteral Drain/Stent Left ureter 6 Fr.   10/31/17    1148    Left ureter   78   Incision (Closed) 10/10/13 Perineum Other (Comment)   10/10/13    1008     1560   Incision (Closed) 05/09/16 Perineum Other (Comment)   05/09/16    1500     618   Incision (Closed) 09/20/16 Perineum Other (Comment)   09/20/16    1405     484          Labs/Imaging Results for orders placed or performed during the hospital encounter of 01/28/2018 (from the past 48 hour(s))  CBC with Differential     Status: Abnormal   Collection  Time: 01/28/2018 12:34 PM  Result Value Ref Range   WBC 9.5 4.0 - 10.5 K/uL   RBC 3.87 (L) 4.22 - 5.81 MIL/uL   Hemoglobin 11.1 (L) 13.0 - 17.0 g/dL   HCT 35.0 (L) 39.0 - 52.0 %   MCV 90.4 80.0 - 100.0 fL   MCH 28.7 26.0 - 34.0 pg   MCHC 31.7 30.0 - 36.0 g/dL   RDW 15.6 (H) 11.5 - 15.5 %   Platelets 187 150 - 400 K/uL    Comment: PLATELET COUNT CONFIRMED BY SMEAR   nRBC 0.0 0.0 - 0.2 %   Neutrophils Relative % 77 %   Neutro Abs 7.1 1.7 - 7.7 K/uL   Lymphocytes Relative 12 %   Lymphs Abs 1.2 0.7 - 4.0 K/uL   Monocytes Relative 9 %   Monocytes Absolute 0.9 0.1 - 1.0 K/uL   Eosinophils Relative 1 %   Eosinophils Absolute 0.1 0.0 - 0.5 K/uL   Basophils Relative 0 %   Basophils Absolute 0.0 0.0 - 0.1 K/uL   Smear Review PLATLET CLUMPS NOTED ON SMEAR    Immature Granulocytes 1 %   Abs Immature Granulocytes 0.12 (H) 0.00 - 0.07 K/uL    Comment: Performed at [facility], [place] [address] [place], [place] [address]  Comprehensive metabolic panel     Status: Abnormal   Collection Time: 01/21/2018 12:34 PM  Result Value Ref Range   Sodium 139 135 - 145 mmol/L   Potassium 3.2 (L) 3.5 - 5.1 mmol/L   Chloride 104 98 - 111 mmol/L   CO2 25 22 - 32 mmol/L   Glucose, Bld 113 (H) 70 - 99 mg/dL   BUN 23 8 - 23 mg/dL   Creatinine, Ser 1.14 0.61 - 1.24 mg/dL   Calcium 8.6 (L) 8.9 - 10.3 mg/dL   Total Protein 6.6 6.5 - 8.1 g/dL   Albumin 3.4 (L) 3.5 - 5.0 g/dL   AST 22 15 - 41 U/L   ALT 16 0 - 44 U/L   Alkaline Phosphatase 89 38 - 126 U/L   Total Bilirubin 1.4 (H) 0.3 - 1.2 mg/dL   GFR calc non Af Amer 59 (L) >60 mL/min   GFR calc Af Amer >60 >60 mL/min   Anion gap 10 5 - 15    Comment:  Performed at [facility], [place] [address], [place], [place] [address]  Protime-INR     Status: None   Collection Time: 01/20/2018 12:34 PM  Result Value Ref Range   Prothrombin Time 12.0 11.4 - 15.2 seconds   INR 0.89     Comment: Performed at [facility], [place] [address] [place], [place] [address]  Brain natriuretic peptide     Status: None   Collection Time: 01/02/2018 12:34 PM  Result Value Ref Range   B Natriuretic Peptide 82.8 0.0 - 100.0 pg/mL    Comment: Performed at [facility], [place] [address] [place], [place] [address]  Troponin I - ONCE - STAT     Status: None   Collection Time: 01/07/2018 12:34 PM  Result Value Ref Range   Troponin I <0.03 <0.03 ng/mL    Comment: Performed at [facility], [place] [address] [place], [place] [address]   Dg Chest 2 View  Result Date: 01/24/2018 CLINICAL DATA:  Left lower chest and abdominal wall discomfort with bruising following a coughing paroxysm 3 days ago. History of COPD, former smoker. EXAM: CHEST - 2 VIEW COMPARISON:  PA and lateral chest x-ray of January 11, 2018 FINDINGS: The lungs are mildly hyperinflated. There is patchy density at the left lung base which is not new. The heart and pulmonary vascularity are normal. There is calcification in the wall of the aortic arch. There is stable biapical pleural thickening. The bony thorax exhibits no acute abnormality. IMPRESSION: Chronic bronchitic changes, stable. No alveolar pneumonia. The visualized portions of the ribs are normal. Thoracic aortic atherosclerosis. Electronically Signed   By: David  Martinique M.D.   On: 01/06/2018 13:04   Ct Abdomen Pelvis W Contrast  Result Date: 01/18/2018 CLINICAL DATA:  coughing fit on Saturday and had bad pains on left side. Pt been using lidocaine patches for pain relief. Wife states she looked at it today and noticed bruising. Pt takes Plavix. Pt take Tussin for cough but per wife didn't take any yesterday. Pt reports pain is worse with coughing or moving. Finished antibiotics and prednisone that was given at previous visit here. EXAM: CT ABDOMEN AND PELVIS WITH CONTRAST TECHNIQUE: Multidetector CT imaging of the abdomen and pelvis was performed using the standard  protocol following bolus administration of intravenous contrast. CONTRAST:  168mL ISOVUE-300 IOPAMIDOL (ISOVUE-300) INJECTION 61% COMPARISON:  05/08/2016 FINDINGS: Lower chest: Bronchiectasis and scarring in the visualized left lower lobe as before. No pleural or pericardial effusion. Hepatobiliary: No focal liver abnormality is seen. No gallstones, gallbladder wall thickening, or biliary dilatation. Pancreas: Unremarkable. No pancreatic ductal dilatation or surrounding inflammatory changes. Spleen: Normal in size without focal abnormality. Adrenals/Urinary Tract: Normal adrenals. Benign appearing left renal cysts as before. No enhancing renal mass. No hydronephrosis. Urinary bladder physiologically distended, mildly thick-walled. Stomach/Bowel: Stomach is decompressed. Small bowel is nondilated. Normal appendix. The colon is nondilated, unremarkable. Vascular/Lymphatic: Calcified atheromatous plaque with extensive nonocclusive mural thrombus in the visualized descending thoracic, suprarenal and juxtarenal segments. Fusiform infrarenal aneurysm up to 3.5 cm transverse diameter (previously 3.4 cm 03/16/2016) with eccentric mildly occlusive mural thrombus, tapering to normal caliber above the bifurcation. Left renal artery ostial stent, patency indeterminate. Calcified ostial plaque involving the right renal artery resulting in high-grade stenosis or segmental occlusion. Heavily calcified ostial plaque and stenting through the length of the left common and external iliac arteries, patency indeterminate. Partially calcified plaque at the origin of the right common iliac artery resulting  in stenosis of possible hemodynamic significance. Vascular stents through the right external iliac artery of indeterminate patency. Extensive partially calcified plaque through the common femoral arteries which are patent. No definite abdominal or pelvic adenopathy Reproductive: Moderate prostatic enlargement with coarse calcifications.  Other: No ascites. No free air. Musculoskeletal: Moderate left rectus sheath hematoma without evidence of active arterial extravasation. Stable benign bone island in the left iliac bone near the SI joint. No fracture or worrisome bone lesion. IMPRESSION: 1. Moderate left rectus sheath hematoma without active arterial extravasation. 2. 3.5 cm infrarenal abdominal aortic aneurysm, increased from 3.4 cm 03/16/2016. 3. Prostatic enlargement with thick-walled urinary bladder suggesting a degree of bladder outlet obstruction. 4. Extensive bilateral iliac arterial stents, of indeterminate patency. Correlate with clinical symptomatology and ABIs. Electronically Signed   By: Lucrezia Europe M.D.   On: 01/23/2018 15:11    Pending Labs Unresulted Labs (From admission, onward)    [place]     Ordered   01/14/2018 1628  Type and screen Hold 2 units now and stay ahead 2 units at all times  Once,   STAT    Comments:  Hold 2 units now and stay ahead 2 units at all times    01/01/2018 1627   Signed and Held  Basic metabolic panel  Tomorrow morning,   R     Signed and Held   Signed and Held  Hemoglobin and hematocrit, blood  Now then every 6 hours,   STAT     Signed and Held          Vitals/Pain Today's Vitals   01/19/2018 1728 01/19/2018 1730 01/11/2018 1800 01/29/2018 1830  BP:  113/63 (!) 119/95 124/81  Pulse:  83 78 81  Resp:  12 (!) 28 18  Temp:      TempSrc:      SpO2:  91% 93% 92%  PainSc: 0-No pain       Isolation Precautions No active isolations  Medications Medications  iopamidol (ISOVUE-300) 61 % injection (has no administration in time range)  sodium chloride (PF) 0.9 % injection (has no administration in time range)  albuterol (PROVENTIL) (2.5 MG/3ML) 0.083% nebulizer solution 2.5 mg (2.5 mg Nebulization Given 01/19/2018 1355)  ipratropium-albuterol (DUONEB) 0.5-2.5 (3) MG/3ML nebulizer solution 3 mL (3 mLs Nebulization Given 01/19/2018 1355)  morphine 4 MG/ML injection 4 mg (4 mg Intravenous Given  01/04/2018 1516)  ipratropium-albuterol (DUONEB) 0.5-2.5 (3) MG/3ML nebulizer solution 3 mL (3 mLs Nebulization Given 01/08/2018 1517)  iopamidol (ISOVUE-300) 61 % injection 100 mL (100 mLs Intravenous Contrast Given 01/15/2018 1438)  methylPREDNISolone sodium succinate (SOLU-MEDROL) 125 mg/2 mL injection 125 mg (125 mg Intravenous Given 01/03/2018 1732)  ipratropium-albuterol (DUONEB) 0.5-2.5 (3) MG/3ML nebulizer solution 3 mL (3 mLs Nebulization Given 01/30/2018 1554)  potassium chloride SA (K-DUR,KLOR-CON) CR tablet 40 mEq (40 mEq Oral Given 01/01/2018 1732)    Mobility non-ambulatory

## 2018-01-17 NOTE — ED Provider Notes (Signed)
[facility] DEPT Provider Note   CSN: [id number] Arrival date & time: 01/24/2018  1019     History   Chief Complaint Chief Complaint  Patient presents with  . Cough  . bruise on stomach    HPI Douglas Monroe is a 84 y.o. male.  HPI 84 year old male with extensive past medical history as below here with left flank pain.  The patient states that he had an aggressive coughing spell on Saturday.  He has been treating COPD with steroids and antibiotics, with mild improvement.  He states that while he was coughing, he felt a tearing sensation along his left anterior abdominal wall.  Since then, has had mild swelling that has now progressed to bruising throughout the left flank.  He has not had any syncope.  He does take Plavix as well as occasionally takes Goody's powder.  Patient also states that his shortness of breath and cough have worsened over the last 24 hours.  He has had difficulty getting around the house due to this.  No fevers.  No urinary symptoms.  No back pain.  Past Medical History:  Diagnosis Date  . Abdominal aortic aneurysm ([place])    infrarenal -- monitored by dr berry (note states stable 4.0 to 4.1cm)  . Allergy to alpha-gal    per aptient and wife; reports he was diagnosed in spring 2019 after a bite form possibly a chigger or tick ;    . Anxiety    situational  . Arthritis    "hands" (03/15/2016)  . Cancer of skin of temple 2017   right  . COPD with emphysema ([place])   . Dyspnea    with exertion  . Exertional dyspnea   . First degree heart block   . GERD (gastroesophageal reflux disease)   . Headache(784.0)    uses goody powder; "maybe weekly, maybe not that much" (03/15/2016)  . History of kidney stones   . HOH (hard of hearing)    left ear; no hearing aid   . HTN (hypertension) 08/20/2011  . Hyperlipidemia   . Hypertension   . Ischemic colitis ([place])   . Left carotid artery stenosis    moderate  . Migraine    "long time ago"  (03/15/2016)  . 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 PVD (peripheral vascular disease) with claudication [facility]) cardiologist-  dr berry   s/p bilateral iliac stents and right sfa stenting/  doppper study 09-05-2012  revealed ABIs close to one bilaterally with patent stents/  stenting left renal artery stenosis   . Right ureteral stone   . S/P arterial stent    left renal angioplasty and stent 11-02-2012  . Sleep apnea    at risk for OSA. stop/bang score= 6; sent to PCP 10/04/13; denies use of mask on 03/15/2016  . Tubular adenoma of colon   . Urticaria     Patient Active Problem List   Diagnosis Date Noted  . Rectus sheath hematoma, initial encounter 01/03/2018  . COPD with acute exacerbation ([place]) 01/04/2018  . Acute respiratory failure with hypoxia ([place]) 01/26/2018  . Hypertensive heart and renal disease 11/08/2017  . Hydronephrosis 05/08/2016  . Acute renal failure superimposed on stage 2 chronic kidney disease ([place]) 05/08/2016  . Polyp of cecum   . Polyp of transverse colon   . BRBPR (bright red blood per rectum)   . Hematochezia   . Ischemic colitis ([place])   . Ulceration, colon   .  Left sided abdominal pain   . Rectal bleeding 03/15/2016  . Acute hyperglycemia 03/15/2016  . COPD exacerbation ([place])   . Renal insufficiency   . PVD (peripheral vascular disease) ([place])   . Ureteral calculus 10/10/2013  . Hyperlipidemia 07/24/2013  . S/P arterial stent, to Lt renal art. 11/02/12 11/03/2012  . Carotid artery disease ([place]) 10/06/2012  . Renal artery stenosis, progression of disease 10/06/2012  . Abdominal aortic aneurysm ([place]) 10/06/2012  . Normal coronary arteries-  07/21/2012  . PVD (peripheral vascular disease) with claudication, previous stents to bilateral iliacs. Rt. SFA also.  08/20/2011  . Claudication of both lower extremities ([place]) 08/20/2011  . PVD S/P multiple PCIs 08/20/2011  . COPD (chronic obstructive pulmonary disease) ([place]) 08/20/2011    . HTN (hypertension) 08/20/2011    Past Surgical History:  Procedure Laterality Date  . ABDOMINAL ANGIOGRAM  08/19/2011   Left common iliac artery at the ostium, 6x44mm ICAST covered stent, common femoral artery, 8x161mm Abbott absolute pro nitinol self-expanding stent and resulting in reduction of 80 and 90% stenosis to 0% residual  . ABDOMINAL ANGIOGRAM  10/08/2010   Mid-right SFA, 6x120 EV3 Protege nitinol self-expanding stent, resulting in reduction of CTO to 0% residual  . ABDOMINAL ANGIOGRAM  06/22/2010   Rt Common Femoral Artery-8x3 Absolute Pro Nitinol self-expanding stent-90% stenosis to 0% residual; Rt External Iliac Artery-9x3 Absolute Pro Nitinol self-expanding stent-70% stenosis to 0% residual; Left External Iliac Artery-8x4 Absolute Pro Nitinol self-expanding stent-90% to 0% residual  . ATHERECTOMY N/A 08/19/2011   Procedure: ATHERECTOMY;  Surgeon: Lorretta Harp, MD;  Location[facility] C[facility] LAB;  Service: Cardiovascular;  Laterality: N/A;  . BACK SURGERY    . CARDIAC CATHETERIZATION  10/26/2002  dr Tami Ribas   normal coronary arteries/ normal  LVF  . CARDIOVASCULAR STRESS TEST  06-12-2010   dr berry   normal perfusion study/ no ischemia/  ef 69%  . COLONOSCOPY WITH PROPOFOL N/A 03/19/2016   Procedure: COLONOSCOPY WITH PROPOFOL;  Surgeon: Mauri Pole, MD;  Location: [facility] END[facility]COPY;  Service: Endoscopy;  Laterality: N/A;  was inpatient, but got d/c'd home and is coming back as an OP  . CYSTOSCOPY W/ URETERAL STENT PLACEMENT Left 06/07/2016   Procedure: CYSTOSCOPY WITH RETROGRADE PYELOGRAM/URETERAL STENT REPLACEMENT;  Surgeon: Cleon Gustin, MD;  Location: [facility];  Service: Urology;  Laterality: Left;  . CYSTOSCOPY W/ URETERAL STENT PLACEMENT Left 09/20/2016   Procedure: CYSTOSCOPY WITH RETROGRADE PYELOGRAM/URETERAL STENT PLACEMENT;  Surgeon: Cleon Gustin, MD;  Location: [facility];  Service: Urology;  Laterality: Left;  . CYSTOSCOPY WITH RETROGRADE PYELOGRAM, URETEROSCOPY AND  STENT PLACEMENT Right 10/10/2013   Procedure: uretheral dilation, CYSTOSCOPY, URETEROSCOPY, stone extraction, STENT PLACEMENT;  Surgeon: [name], MD;  Location: [facility];  Service: Urology;  Laterality: Right;  . CYSTOSCOPY WITH RETROGRADE PYELOGRAM, URETEROSCOPY AND STENT PLACEMENT Left 02/08/2017   Procedure: CYSTOSCOPY WITH RETROGRADE PYELOGRAM, URETEROSCOPY;  Surgeon: Cleon Gustin, MD;  Location: [facility];  Service: Urology;  Laterality: Left;  . CYSTOSCOPY WITH RETROGRADE PYELOGRAM, URETEROSCOPY AND STENT PLACEMENT Left 05/16/2017   Procedure: CYSTOSCOPY WITH RETROGRADE PYELOGRAM, URETEROSCOPY WITH ABLATION OF URETERAL TUMOR, BLADDER BIOPSY AND FULGURATION, AND STENT PLACEMENT;  Surgeon: Cleon Gustin, MD;  Location: [facility];  Service: Urology;  Laterality: Left;  . CYSTOSCOPY WITH RETROGRADE PYELOGRAM, URETEROSCOPY AND STENT PLACEMENT Left 06/13/2017   Procedure: CYSTOSCOPY WITH RETROGRADE PYELOGRAM, URETEROSCOPY STENT EXCHANGE;  Surgeon: Cleon Gustin, MD;  Location: [facility];  Service: Urology;  Laterality: Left;  .  CYSTOSCOPY WITH RETROGRADE PYELOGRAM, URETEROSCOPY AND STENT PLACEMENT Left 10/31/2017   Procedure: CYSTOSCOPY WITH RETROGRADE PYELOGRAM, URETEROSCOPY ,FULGURATION OF URETERAL TUMOR AND STENT PLACEMENT;  Surgeon: Cleon Gustin, MD;  Location: [facility];  Service: Urology;  Laterality: Left;  1 HR POSSIBLE STENT PLACEMENT, ALSO POSSIBLE TUMOR ABLATION  . CYSTOSCOPY WITH URETEROSCOPY Left 05/09/2016   Procedure: CYSTOSCOPY, RETROGRADE, LEFT URETEROSCOPY, LEFT URETERAL BIOPSIES, HOLMIUM LASER FULGERATION, LEFT URETERAL STENT PLACEMENT;  Surgeon: Irine Seal, MD;  Location: [facility];  Service: Urology;  Laterality: Left;  . CYSTOSCOPY/URETEROSCOPY/HOLMIUM LASER Left 09/20/2016   Procedure: CYSTOSCOPY/URETEROSCOPY/HOLMIUM LASER;  Surgeon: Cleon Gustin, MD;  Location: [facility];  Service: Urology;  Laterality: Left;  . HOLMIUM LASER APPLICATION Right  0/0/8676   Procedure: HOLMIUM LASER APPLICATION;  Surgeon: [name], MD;  Location: [facility];  Service: Urology;  Laterality: Right;  . HOLMIUM LASER APPLICATION Left 02/10/5091   Procedure: TUMOR ABLATION WITH HOLMIUM LASER;  Surgeon: Cleon Gustin, MD;  Location: [facility];  Service: Urology;  Laterality: Left;  . LUMBAR DISC SURGERY  1970's  . RENAL ANGIOGRAM N/A 11/02/2012   Procedure: RENAL ANGIOGRAM;  Surgeon: Lorretta Harp, MD;  Location[facility] CATH LAB;  Service: Cardiovascular;  Laterality: N/A;  . RENAL ARTERY STENT Left 11/02/2012  . SKIN CANCER EXCISION Right 2017   temple  . SOFT TISSUE CYST EXCISION  1960's   "outside part of lung LLL"  . TRANSURETHRAL RESECTION OF BLADDER TUMOR N/A 06/13/2017   Procedure: TRANSURETHRAL RESECTION OF BLADDER TUMOR (TURBT);  Surgeon: Cleon Gustin, MD;  Location: [facility];  Service: Urology;  Laterality: N/A;        Home Medications    Prior to Admission medications   Medication Sig Start Date End Date Taking? Authorizing Provider  albuterol (PROVENTIL HFA;VENTOLIN HFA) 108 (90 Base) MCG/ACT inhaler Inhale 2 puffs into the lungs every 6 (six) hours as needed for wheezing or shortness of breath.   Yes [provider]  albuterol (PROVENTIL) (2.5 MG/3ML) 0.083% nebulizer solution Take 2.5 mg by nebulization every 6 (six) hours as needed for wheezing or shortness of breath.   Yes [provider]  amLODipine (NORVASC) 5 MG tablet TAKE 1 TABLET BY MOUTH  DAILY 01/16/18  Yes Glendale Chard, MD  Aspirin-Acetaminophen-Caffeine (GOODY HEADACHE PO) Take 1 packet by mouth daily as needed (for headache).   Yes [provider]  atorvastatin (LIPITOR) 20 MG tablet TAKE 1 TABLET BY MOUTH  DAILY AT 6 PM. Patient taking differently: Take 20 mg by mouth every morning. Patient takes in the morning 07/13/17  Yes Lorretta Harp, MD  BYSTOLIC 10 MG tablet Take 10 mg by mouth daily. 02/23/16  Yes [provider]  chlorpheniramine-HYDROcodone (TUSSIONEX PENNKINETIC ER) 10-8 MG/5ML SUER Take 5 mLs by mouth every 12 (twelve) hours as needed for cough. 01/11/18  Yes Asencion Noble, MD  clopidogrel (PLAVIX) 75 MG tablet TAKE 1 TABLET BY MOUTH  EVERY DAY 07/13/17  Yes Lorretta Harp, MD  [employer] D3 2000 units CAPS Take 2,000 Units by mouth daily.  01/31/16  Yes [provider]  [employer]S MAGNESIUM OXIDE 500 MG TABS TAKE 1 TABLET BY MOUTH EVERY DAY 11/21/17  Yes Glendale Chard, MD  finasteride (PROSCAR) 5 MG tablet Take 5 mg by mouth daily.    Yes [provider]  Fluticasone-Umeclidin-Vilant (TRELEGY ELLIPTA) 100-62.5-25 MCG/INH AEPB Inhale 1 puff into the lungs daily. 02/22/17  Yes Mannam, Hart Robinsons, MD  loratadine (CLARITIN) 10  MG tablet Take 10 mg by mouth daily.   Yes [provider]  pantoprazole (PROTONIX) 40 MG tablet TAKE 1 TABLET BY MOUTH EVERY DAY 11/04/17  Yes Glendale Chard, MD  ranitidine (ZANTAC) 150 MG tablet TAKE 1 TABLET BY MOUTH TWICE A DAY 11/21/17  Yes Glendale Chard, MD  tamsulosin (FLOMAX) 0.4 MG CAPS capsule Take 0.4 mg by mouth daily.  07/13/13  Yes [provider]  tiotropium (SPIRIVA) 18 MCG inhalation capsule Place 18 mcg into inhaler and inhale daily.   Yes [provider]  amoxicillin (AMOXIL) 875 MG tablet Take 1 tablet (875 mg total) by mouth 2 (two) times daily. Patient not taking: Reported on 01/27/2018 01/09/18   Minette Brine, FNP  EPINEPHrine 0.3 mg/0.3 mL IJ SOAJ injection Inject 0.3 mg into the muscle daily as needed (allergic reaction).  05/17/17   [provider]  predniSONE (STERAPRED UNI-PAK 21 TAB) 10 MG (21) TBPK tablet Take as directed Patient not taking: Reported on 01/30/2018 01/09/18   Minette Brine, FNP    Family History Family History  Problem Relation Age of Onset  . Hypertension Mother   . Cancer Father   . Diabetes Brother   . Hypertension Son     Social History Social History   Tobacco Use    . Smoking status: Former Smoker    Packs/day: 1.00    Years: 60.00    Pack years: 60.00    Types: Cigarettes    Last attempt to quit: 06/02/2010    Years since quitting: 7.6  . Smokeless tobacco: Never Used  Substance Use Topics  . Alcohol use: No  . Drug use: No     Allergies   Meat extract and Pneumococcal vaccines   Review of Systems Review of Systems  Constitutional: Positive for fatigue. Negative for chills and fever.  HENT: Negative for congestion and rhinorrhea.   Eyes: Negative for visual disturbance.  Respiratory: Positive for cough, shortness of breath and wheezing.   Cardiovascular: Negative for chest pain and leg swelling.  Gastrointestinal: Positive for abdominal pain. Negative for diarrhea, nausea and vomiting.  Genitourinary: Negative for dysuria and flank pain.  Musculoskeletal: Negative for neck pain and neck stiffness.  Skin: Positive for color change. Negative for rash and wound.  Allergic/Immunologic: Negative for immunocompromised state.  Neurological: Negative for syncope, weakness and headaches.  All other systems reviewed and are negative.    Physical Exam Updated Vital Signs BP 124/81   Pulse 81   Temp 98.1 F (36.7 C) (Oral)   Resp 18   SpO2 92%   Physical Exam Vitals signs and nursing note reviewed.  Constitutional:      General: He is not in acute distress.    Appearance: He is well-developed.  HENT:     Head: Normocephalic and atraumatic.  Eyes:     Conjunctiva/sclera: Conjunctivae normal.  Neck:     Musculoskeletal: Neck supple.  Cardiovascular:     Rate and Rhythm: Normal rate and regular rhythm.     Heart sounds: Normal heart sounds. No murmur. No friction rub.  Pulmonary:     Effort: Pulmonary effort is normal. Tachypnea present. No respiratory distress.     Breath sounds: Decreased air movement present. Examination of the right-upper field reveals wheezing. Examination of the left-upper field reveals wheezing. Examination  of the right-middle field reveals wheezing. Examination of the left-middle field reveals wheezing. Examination of the right-lower field reveals wheezing and rhonchi. Examination of the left-lower field reveals wheezing. Wheezing and  rhonchi present. No rales.  Abdominal:     General: There is no distension.     Comments: Significant ecchymosis along the left anterior abdominal wall and flank.  No open wounds.  Significant associated tenderness to palpation.  Skin:    General: Skin is warm.     Capillary Refill: Capillary refill takes less than 2 seconds.  Neurological:     Mental Status: He is alert and oriented to person, place, and time.     Motor: No abnormal muscle tone.      ED Treatments / Results  Labs (all labs ordered are listed, but only abnormal results are displayed) Labs Reviewed  CBC WITH DIFFERENTIAL/PLATELET - Abnormal; Notable for the following components:      Result Value   RBC 3.87 (*)    Hemoglobin 11.1 (*)    HCT 35.0 (*)    RDW 15.6 (*)    Abs Immature Granulocytes 0.12 (*)    All other components within normal limits  COMPREHENSIVE METABOLIC PANEL - Abnormal; Notable for the following components:   Potassium 3.2 (*)    Glucose, Bld 113 (*)    Calcium 8.6 (*)    Albumin 3.4 (*)    Total Bilirubin 1.4 (*)    GFR calc non Af Amer 59 (*)    All other components within normal limits  PROTIME-INR  BRAIN NATRIURETIC PEPTIDE  TROPONIN I  TYPE AND SCREEN    EKG None  Radiology Dg Chest 2 View  Result Date: 01/04/2018 CLINICAL DATA:  Left lower chest and abdominal wall discomfort with bruising following a coughing paroxysm 3 days ago. History of COPD, former smoker. EXAM: CHEST - 2 VIEW COMPARISON:  PA and lateral chest x-ray of January 11, 2018 FINDINGS: The lungs are mildly hyperinflated. There is patchy density at the left lung base which is not new. The heart and pulmonary vascularity are normal. There is calcification in the wall of the aortic arch.  There is stable biapical pleural thickening. The bony thorax exhibits no acute abnormality. IMPRESSION: Chronic bronchitic changes, stable. No alveolar pneumonia. The visualized portions of the ribs are normal. Thoracic aortic atherosclerosis. Electronically Signed   By: David  Martinique M.D.   On: 01/25/2018 13:04   Ct Abdomen Pelvis W Contrast  Result Date: 01/06/2018 CLINICAL DATA:  coughing fit on Saturday and had bad pains on left side. Pt been using lidocaine patches for pain relief. Wife states she looked at it today and noticed bruising. Pt takes Plavix. Pt take Tussin for cough but per wife didn't take any yesterday. Pt reports pain is worse with coughing or moving. Finished antibiotics and prednisone that was given at previous visit here. EXAM: CT ABDOMEN AND PELVIS WITH CONTRAST TECHNIQUE: Multidetector CT imaging of the abdomen and pelvis was performed using the standard protocol following bolus administration of intravenous contrast. CONTRAST:  157mL ISOVUE-300 IOPAMIDOL (ISOVUE-300) INJECTION 61% COMPARISON:  05/08/2016 FINDINGS: Lower chest: Bronchiectasis and scarring in the visualized left lower lobe as before. No pleural or pericardial effusion. Hepatobiliary: No focal liver abnormality is seen. No gallstones, gallbladder wall thickening, or biliary dilatation. Pancreas: Unremarkable. No pancreatic ductal dilatation or surrounding inflammatory changes. Spleen: Normal in size without focal abnormality. Adrenals/Urinary Tract: Normal adrenals. Benign appearing left renal cysts as before. No enhancing renal mass. No hydronephrosis. Urinary bladder physiologically distended, mildly thick-walled. Stomach/Bowel: Stomach is decompressed. Small bowel is nondilated. Normal appendix. The colon is nondilated, unremarkable. Vascular/Lymphatic: Calcified atheromatous plaque with extensive nonocclusive mural thrombus in the  visualized descending thoracic, suprarenal and juxtarenal segments. Fusiform infrarenal  aneurysm up to 3.5 cm transverse diameter (previously 3.4 cm 03/16/2016) with eccentric mildly occlusive mural thrombus, tapering to normal caliber above the bifurcation. Left renal artery ostial stent, patency indeterminate. Calcified ostial plaque involving the right renal artery resulting in high-grade stenosis or segmental occlusion. Heavily calcified ostial plaque and stenting through the length of the left common and external iliac arteries, patency indeterminate. Partially calcified plaque at the origin of the right common iliac artery resulting in stenosis of possible hemodynamic significance. Vascular stents through the right external iliac artery of indeterminate patency. Extensive partially calcified plaque through the common femoral arteries which are patent. No definite abdominal or pelvic adenopathy Reproductive: Moderate prostatic enlargement with coarse calcifications. Other: No ascites. No free air. Musculoskeletal: Moderate left rectus sheath hematoma without evidence of active arterial extravasation. Stable benign bone island in the left iliac bone near the SI joint. No fracture or worrisome bone lesion. IMPRESSION: 1. Moderate left rectus sheath hematoma without active arterial extravasation. 2. 3.5 cm infrarenal abdominal aortic aneurysm, increased from 3.4 cm 03/16/2016. 3. Prostatic enlargement with thick-walled urinary bladder suggesting a degree of bladder outlet obstruction. 4. Extensive bilateral iliac arterial stents, of indeterminate patency. Correlate with clinical symptomatology and ABIs. Electronically Signed   By: Lucrezia Europe M.D.   On: 01/03/2018 15:11    Procedures Procedures (including critical care time)  Medications Ordered in ED Medications  iopamidol (ISOVUE-300) 61 % injection (has no administration in time range)  sodium chloride (PF) 0.9 % injection (has no administration in time range)  albuterol (PROVENTIL) (2.5 MG/3ML) 0.083% nebulizer solution 2.5 mg (2.5 mg  Nebulization Given 01/22/2018 1355)  ipratropium-albuterol (DUONEB) 0.5-2.5 (3) MG/3ML nebulizer solution 3 mL (3 mLs Nebulization Given 01/11/2018 1355)  morphine 4 MG/ML injection 4 mg (4 mg Intravenous Given 01/02/2018 1516)  ipratropium-albuterol (DUONEB) 0.5-2.5 (3) MG/3ML nebulizer solution 3 mL (3 mLs Nebulization Given 01/25/2018 1517)  iopamidol (ISOVUE-300) 61 % injection 100 mL (100 mLs Intravenous Contrast Given 01/06/2018 1438)  methylPREDNISolone sodium succinate (SOLU-MEDROL) 125 mg/2 mL injection 125 mg (125 mg Intravenous Given 01/22/2018 1732)  ipratropium-albuterol (DUONEB) 0.5-2.5 (3) MG/3ML nebulizer solution 3 mL (3 mLs Nebulization Given 01/14/2018 1554)  potassium chloride SA (K-DUR,KLOR-CON) CR tablet 40 mEq (40 mEq Oral Given 01/31/2018 1732)     Initial Impression / Assessment and Plan / ED Course  I have reviewed the triage vital signs and the nursing notes.  Pertinent labs & imaging results that were available during my care of the patient were reviewed by me and considered in my medical decision making (see chart for details).  Clinical Course as of Jan 18 2027  Tue Jan 17, 2018  1247 84 yo M here with ongoing COPD exacerbation and now bruising to L abdomen. Regarding his COPD, he does have wheezing and increased WOB here - will give treatments, likely needs longer taper of steroids. Regarding his abd wall bruising, I suspect he may have torn an abd wall muscle with subsequent hematoma, now breaking down with superficial ecchymoses. Will check CT, hemoglobin, labs.    [CI]  1527 Labs show significant blood loss from his hematoma but no transfusion needed currently. Imaging shows rectus hematoma, no extrav. Pt remains borderline hypoxic and tachypneic, with increased WOB. I suspect his COPD is now complicated by pani from rectus abdominal hematoma. Will start analgesia, steroids, admit.   [CI]    Clinical Course User Index [CI] Duffy Bruce, MD  Final Clinical  Impressions(s) / ED Diagnoses   Final diagnoses:  Abdominal wall hematoma, initial encounter  COPD exacerbation [facility])    ED Discharge Orders    None       Duffy Bruce, MD 01/13/2018 2028

## 2018-01-18 ENCOUNTER — Other Ambulatory Visit: Payer: Self-pay

## 2018-01-18 LAB — BASIC METABOLIC PANEL
Anion gap: 9 (ref 5–15)
BUN: 28 mg/dL — ABNORMAL HIGH (ref 8–23)
CALCIUM: 8.5 mg/dL — AB (ref 8.9–10.3)
CO2: 24 mmol/L (ref 22–32)
Chloride: 105 mmol/L (ref 98–111)
Creatinine, Ser: 1.3 mg/dL — ABNORMAL HIGH (ref 0.61–1.24)
GFR calc Af Amer: 58 mL/min — ABNORMAL LOW (ref >60)
GFR calc non Af Amer: 50 mL/min — ABNORMAL LOW (ref >60)
Glucose, Bld: 208 mg/dL — ABNORMAL HIGH (ref 70–99)
Potassium: 4.2 mmol/L (ref 3.5–5.1)
SODIUM: 138 mmol/L (ref 135–145)

## 2018-01-18 LAB — HEMOGLOBIN AND HEMATOCRIT, BLOOD
HCT: 33.5 % — ABNORMAL LOW (ref 39.0–52.0)
HCT: 31.9 % — ABNORMAL LOW (ref 39.0–52.0)
HCT: 32.2 % — ABNORMAL LOW (ref 39.0–52.0)
HEMOGLOBIN: 10.5 g/dL — AB (ref 13.0–17.0)
Hemoglobin: 10.1 g/dL — ABNORMAL LOW (ref 13.0–17.0)
Hemoglobin: 10.1 g/dL — ABNORMAL LOW (ref 13.0–17.0)

## 2018-01-18 LAB — GLUCOSE, CAPILLARY
GLUCOSE-CAPILLARY: 193 mg/dL — AB (ref 70–99)
Glucose-Capillary: 177 mg/dL — ABNORMAL HIGH (ref 70–99)
Glucose-Capillary: 225 mg/dL — ABNORMAL HIGH (ref 70–99)
Glucose-Capillary: 203 mg/dL — ABNORMAL HIGH (ref 70–99)

## 2018-01-18 MED ORDER — INSULIN ASPART 100 UNIT/ML ~~LOC~~ SOLN
0.0000 [IU] | Freq: Three times a day (TID) | SUBCUTANEOUS | Status: DC
  Administered 2018-01-18: 3 [IU] via SUBCUTANEOUS
  Administered 2018-01-18 – 2018-01-19 (×2): 2 [IU] via SUBCUTANEOUS
  Administered 2018-01-19: 3 [IU] via SUBCUTANEOUS
  Administered 2018-01-20 (×2): 2 [IU] via SUBCUTANEOUS
  Administered 2018-01-20: 1 [IU] via SUBCUTANEOUS
  Administered 2018-01-21: 2 [IU] via SUBCUTANEOUS
  Administered 2018-01-21 – 2018-01-22 (×2): 1 [IU] via SUBCUTANEOUS
  Administered 2018-01-22: 2 [IU] via SUBCUTANEOUS
  Administered 2018-01-22: 1 [IU] via SUBCUTANEOUS
  Administered 2018-01-23 (×2): 2 [IU] via SUBCUTANEOUS
  Administered 2018-01-23: 1 [IU] via SUBCUTANEOUS
  Administered 2018-01-24 (×2): 2 [IU] via SUBCUTANEOUS
  Administered 2018-01-24: 1 [IU] via SUBCUTANEOUS
  Administered 2018-01-25: 2 [IU] via SUBCUTANEOUS
  Administered 2018-01-25: 1 [IU] via SUBCUTANEOUS
  Administered 2018-01-25: 2 [IU] via SUBCUTANEOUS
  Administered 2018-01-26: 3 [IU] via SUBCUTANEOUS
  Administered 2018-01-26 – 2018-01-27 (×2): 1 [IU] via SUBCUTANEOUS
  Administered 2018-01-27 – 2018-01-29 (×5): 2 [IU] via SUBCUTANEOUS
  Administered 2018-01-30: 1 [IU] via SUBCUTANEOUS
  Administered 2018-01-30 (×2): 3 [IU] via SUBCUTANEOUS
  Administered 2018-01-31 – 2018-02-01 (×4): 2 [IU] via SUBCUTANEOUS
  Administered 2018-02-01: 1 [IU] via SUBCUTANEOUS

## 2018-01-18 NOTE — Progress Notes (Addendum)
SATURATION QUALIFICATIONS: (This note is used to comply with regulatory documentation for home oxygen)  Patient Saturations on Room Air at Rest = 92  Patient Saturations on Room Air while Ambulating = 87 \ Patient Saturations on 2 Liters of oxygen while Ambulating = 92  Please briefly explain why patient needs home oxygen: Pt ambulated with PT, SOB noted during and after ambulation. Pt return to bed and recovering, SOB noted. Sats on 2 l 92-93%.

## 2018-01-18 NOTE — Progress Notes (Signed)
  SATURATION QUALIFICATIONS: (This note is used to comply with regulatory documentation for home oxygen)  Patient Saturations on Room Air at Rest = 92%  Patient Saturations on Room Air while Ambulating = 87%  Patient Saturations on 1 Liters of oxygen while Ambulating = 89%  Patient Saturations on 2 Liters of oxygen while Ambulating = 94%  Please briefly explain why patient needs home oxygen: To maintain O2sat >92% upon d/c home.   Julien Girt, PT Acute Rehabilitation Services Pager [phone]  Office [phone]

## 2018-01-18 NOTE — Evaluation (Signed)
Physical Therapy Evaluation Patient Details Name: Douglas Monroe MRN: [id number] DOB: 1932/05/01 Today's Date: 01/18/2018   History of Present Illness  Pt is a 84 yo male admitted 12/17 with large rectal sheath hematoma due to coughing and COPD exacerbation with acute respiratory failure with hypoxia. Pt with previous admission on 12/11, d/ced on the 15th for similar issues. PMH includes HTN, GERD, PVD, AAA s/p stenting 2014, anxiety,  first degree heart block, HOH, HLD, ischemic colitis, L carotid artery stenosis, multiple urological surgeries, lumbar surgery.    Clinical Impression  Pt presents with decreased tolerance for mobility due to dyspnea, LE weakness, and desaturation with ambulation. Pt to benefit from acute PT to address deficits. Pt ambulated 350 ft with no AD with supervision, pt with no LOB or instability noted. Pt with desaturation to 87% on room air with ambulation. Full documentation of sats in separate note. PT to progress mobility as tolerated, and will continue to follow acutely.      Follow Up Recommendations Supervision for mobility/OOB;No PT follow up    Equipment Recommendations  None recommended by PT    Recommendations for Other Services       Precautions / Restrictions Precautions Precautions: Fall Restrictions Weight Bearing Restrictions: No      Mobility  Bed Mobility Overal bed mobility: Modified Independent             General bed mobility comments: Increased time/effort.  Transfers Overall transfer level: Modified independent Equipment used: None             General transfer comment: increased effort to perform. no assist needed, and no imbalance noted.   Ambulation/Gait Ambulation/Gait assistance: Supervision Gait Distance (Feet): 350 Feet Assistive device: None Gait Pattern/deviations: Step-through pattern;Decreased stride length Gait velocity: slightly decr   General Gait Details: Supervision for safety. No verbal cuing  required, no unsteadiness or LOB noted. Attempted ambulation with supplemental O2 but unable to do so due to sats dropping to 87%, see assessment section. Pt with dyspnea 3/4 with ambulation, 2 standing rest breaks required.   Stairs            Wheelchair Mobility    Modified Rankin (Stroke Patients Only)       Balance Overall balance assessment: Mild deficits observed, not formally tested                                           Pertinent Vitals/Pain Pain Assessment: 0-10 Pain Score: 2  Pain Location: abdomen, with coughing  Pain Descriptors / Indicators: Sore Pain Intervention(s): Limited activity within patient's tolerance;Monitored during session    Home Living Family/patient expects to be discharged to:: Private residence Living Arrangements: Spouse/significant other Available Help at Discharge: Family;Available 24 hours/day(Pt's wife uses wheelchair for long distances, but husband states she can assist with ADLs) Type of Home: House Home Access: Ramped entrance     Home Layout: Multi-level;Able to live on main level with bedroom/bathroom Home Equipment: Walker - 2 wheels Additional Comments: wife has a walker, but she doesn't use it very often.     Prior Function Level of Independence: Independent         Comments: Pt reports being independent prior to admission, does laundry frequently which is located in the basement. Pt reports recent shortness of breath has slowed him down with ADLs and hobbies (mowing the grass).  Hand Dominance   Dominant Hand: Right    Extremity/Trunk Assessment   Upper Extremity Assessment Upper Extremity Assessment: Overall WFL for tasks assessed    Lower Extremity Assessment Lower Extremity Assessment: RLE deficits/detail;LLE deficits/detail RLE Deficits / Details: MMT bilaterally: hip flexion 4/5, knee extension 4+/5, hip abduction/adduction 3+/5 RLE Sensation: WNL LLE Deficits / Details: MMT  bilaterally: hip flexion 4/5, knee extension 4+/5, hip abduction/adduction 3+/5 LLE Sensation: WNL    Cervical / Trunk Assessment Cervical / Trunk Assessment: Other exceptions;Lordotic Cervical / Trunk Exceptions: Pt with elevated shoulder position bilaterally, suspected due to use of accessory muscles for breathing. Pt with large abdomen, producing lumbar lordosis.  Communication   Communication: No difficulties  Cognition Arousal/Alertness: Awake/alert Behavior During Therapy: WFL for tasks assessed/performed Overall Cognitive Status: Within Functional Limits for tasks assessed                                        General Comments      Exercises     Assessment/Plan    PT Assessment Patient needs continued PT services  PT Problem List Cardiopulmonary status limiting activity;Decreased strength;Pain;Decreased activity tolerance;Decreased mobility       PT Treatment Interventions Therapeutic activities;Gait training;Therapeutic exercise;Patient/family education;Balance training;Functional mobility training    PT Goals (Current goals can be found in the Care Plan section)  Acute Rehab PT Goals Patient Stated Goal: go home, breathe better  PT Goal Formulation: With patient Time For Goal Achievement: 02/01/18 Potential to Achieve Goals: Good    Frequency Min 3X/week   Barriers to discharge        Co-evaluation               AM-PAC PT "6 Clicks" Mobility  Outcome Measure Help needed turning from your back to your side while in a flat bed without using bedrails?: None Help needed moving from lying on your back to sitting on the side of a flat bed without using bedrails?: None Help needed moving to and from a bed to a chair (including a wheelchair)?: None Help needed standing up from a chair using your arms (e.g., wheelchair or bedside chair)?: None Help needed to walk in hospital room?: None Help needed climbing 3-5 steps with a railing? : A  Little 6 Click Score: 23    End of Session Equipment Utilized During Treatment: Gait belt Activity Tolerance: Patient tolerated treatment well Patient left: in bed;with bed alarm set;with call bell/phone within reach;with SCD's reapplied Nurse Communication: Mobility status;Other (comment)(O2 sats during session ) PT Visit Diagnosis: Other abnormalities of gait and mobility (R26.89)    Time: 7035-0093 PT Time Calculation (min) (ACUTE ONLY): 29 min   Charges:   PT Evaluation $PT Eval Low Complexity: 1 Low PT Treatments $Gait Training: 8-22 mins        Julien Girt, PT Acute Rehabilitation Services Pager [phone]  Office [phone]   Zaiah Credeur D Azekiel Cremer 01/18/2018, 3:06 PM

## 2018-01-18 NOTE — Progress Notes (Signed)
PROGRESS NOTE    Douglas Monroe  IOX:[id number] DOB: [birth year] DOA: 01/08/2018 PCP:    Brief Narrative: 84 year old with past medical history significant for COPD, not on home oxygen, peripheral vascular disease status post iliac stenting followed by Dr. Alvester Chou, hypertension, hyperlipidemia who was evaluated by his PCP 7 days a day ago for nonproductive cough and wheezing.  Patient was treated by his PCP with antibiotics and prednisone, he presented 2 days later to the emergency department with persistent cough wheezing worsening shortness of breath.  He was instructed to continue his prednisone and to finish antibiotics.  He presents to the ED with worsening shortness of breath.  He was found to have rectus sheath hematoma, with 2 g drop of hemoglobin since last seen in the ED  Assessment & Plan:   Principal Problem:   COPD with acute exacerbation ([place]) Active Problems:   PVD (peripheral vascular disease) with claudication, previous stents to bilateral iliacs. Rt. SFA also.    PVD S/P multiple PCIs   HTN (hypertension)   Abdominal aortic aneurysm ([facility])   S/P arterial stent, to Lt renal art. 11/02/12   Rectus sheath hematoma, initial encounter   Acute respiratory failure with hypoxia ([facility])   1-Acute COPD exacerbation, acute hypoxic respiratory failure; Patient continued to be short of breath, tachypneic, bilateral wheezing. Continue with IV Solu-Medrol every 6 hours, oral doxycycline, nebulizer treatment. Patient requiring 2 L of oxygen currently.  2-Rectus sheath hematoma with acute blood loss anemia; Presumed to be after a vigorous coughing spell Hemoglobin today has remained stable around 10. Repeat hemoglobin this afternoon and tomorrow morning. Continue to hold Plavix  3-Peripheral vascular disease, with claudication previous stents to bilateral iliacs. Rt. SFA also.  Stent placement 2014. Follow with Dr. Alvester Chou. Hold Plavix in the setting of acute bleed.  4-Abdominal  aortic aneurysm ([place]) Noted for slight increase in size to 3.5 cm.  Follow as outpatient.  5-BPH, bladder outlet obstruction on CT. Resume Flomax and finasteride [name]. Bladder scan.  Hypokalemia replete.  Hyperglycemia: In the setting of IV steroids. Start sliding scale insulin. RN Pressure Injury Documentation:    Malnutrition Type:      Malnutrition Characteristics:      Nutrition Interventions:     Estimated body mass index is 26.87 kg/m as calculated from the following:   Height as of 01/11/18: 5\' 4"  (1.626 m).   Weight as of 01/11/18: 71 kg.   DVT prophylaxis: SCDs Code Status: Full code Family Communication: Wife who was at bedside. Disposition Plan: Remain in the hospital for treatment of COPD exacerbation, patient not at baseline for respiratory status. Consultants:  None  Procedures:   None   Antimicrobials: Doxycycline  Subjective: He had a bad coughing spell episode last week and after that he had an intense pain in his abdomen, he noticed his large bruise in his abdomen on Saturday. He is complaining of shortness of breath, wheezing, persistent cough.  Objective: Vitals:   01/01/2018 2114 01/18/18 0518 01/18/18 0920 01/18/18 1227  BP:  134/69    Pulse:  71    Resp:  20    Temp:  (!) 97.4 F (36.3 C)    TempSrc:  Oral    SpO2: 96% 98% 96% 93%    Intake/Output Summary (Last 24 hours) at 01/18/2018 1350 Last data filed at 01/18/2018 0836 Gross per 24 hour  Intake 240 ml  Output -  Net 240 ml   There were no vitals filed for this visit.  Examination:  General exam:  tachypnea Respiratory system: Bilateral wheezing worse on the right Cardiovascular system: S1 & S2 heard, RRR. No JVD, murmurs, rubs, gallops or clicks. No pedal edema. Gastrointestinal system: Abdomen is nondistended, soft and nontender. No organomegaly or masses felt. Normal bowel sounds heard. Central nervous system: Alert and oriented. No focal neurological  deficits. Extremities: Symmetric 5 x 5 power. Skin: No rashes, lesions or ulcers Psychiatry: Judgement and insight appear normal. Mood & affect appropriate.     Data Reviewed: I have personally reviewed following labs and imaging studies  CBC: Recent Labs  Lab 01/19/2018 1234 01/04/2018 2052 01/18/18 0239 01/18/18 0855  WBC 9.5  --   --   --   NEUTROABS 7.1  --   --   --   HGB 11.1* 11.2* 10.5* 10.1*  HCT 35.0* 35.7* 33.5* 31.9*  MCV 90.4  --   --   --   PLT 187  --   --   --    Basic Metabolic Panel: Recent Labs  Lab 01/18/2018 1234 01/18/18 0239  NA 139 138  K 3.2* 4.2  CL 104 105  CO2 25 24  GLUCOSE 113* 208*  BUN 23 28*  CREATININE 1.14 1.30*  CALCIUM 8.6* 8.5*   GFR: Estimated Creatinine Clearance: 36.1 mL/min (A) (by C-G formula based on SCr of 1.3 mg/dL (H)). Liver Function Tests: Recent Labs  Lab 01/27/2018 1234  AST 22  ALT 16  ALKPHOS 89  BILITOT 1.4*  PROT 6.6  ALBUMIN 3.4*   No results for input(s): LIPASE, AMYLASE in the last 168 hours. No results for input(s): AMMONIA in the last 168 hours. Coagulation Profile: Recent Labs  Lab 01/13/2018 1234  INR 0.89   Cardiac Enzymes: Recent Labs  Lab 01/14/2018 1234  TROPONINI <0.03   BNP (last 3 results) No results for input(s): PROBNP in the last 8760 hours. HbA1C: No results for input(s): HGBA1C in the last 72 hours. CBG: Recent Labs  Lab 01/18/18 0733 01/18/18 1125  GLUCAP 177* 225*   Lipid Profile: No results for input(s): CHOL, HDL, LDLCALC, TRIG, CHOLHDL, LDLDIRECT in the last 72 hours. Thyroid Function Tests: No results for input(s): TSH, T4TOTAL, FREET4, T3FREE, THYROIDAB in the last 72 hours. Anemia Panel: No results for input(s): VITAMINB12, FOLATE, FERRITIN, TIBC, IRON, RETICCTPCT in the last 72 hours. Sepsis Labs: No results for input(s): PROCALCITON, LATICACIDVEN in the last 168 hours.  No results found for this or any previous visit (from the past 240 hour(s)).        Radiology Studies: Dg Chest 2 View  Result Date: 01/31/2018 CLINICAL DATA:  Left lower chest and abdominal wall discomfort with bruising following a coughing paroxysm 3 days ago. History of COPD, former smoker. EXAM: CHEST - 2 VIEW COMPARISON:  PA and lateral chest x-ray of January 11, 2018 FINDINGS: The lungs are mildly hyperinflated. There is patchy density at the left lung base which is not new. The heart and pulmonary vascularity are normal. There is calcification in the wall of the aortic arch. There is stable biapical pleural thickening. The bony thorax exhibits no acute abnormality. IMPRESSION: Chronic bronchitic changes, stable. No alveolar pneumonia. The visualized portions of the ribs are normal. Thoracic aortic atherosclerosis. Electronically Signed   By: David  Martinique M.D.   On: 01/19/2018 13:04   Ct Abdomen Pelvis W Contrast  Result Date: 01/25/2018 CLINICAL DATA:  coughing fit on Saturday and had bad pains on left side. Pt been using lidocaine patches for pain relief. Wife  states she looked at it today and noticed bruising. Pt takes Plavix. Pt take Tussin for cough but per wife didn't take any yesterday. Pt reports pain is worse with coughing or moving. Finished antibiotics and prednisone that was given at previous visit here. EXAM: CT ABDOMEN AND PELVIS WITH CONTRAST TECHNIQUE: Multidetector CT imaging of the abdomen and pelvis was performed using the standard protocol following bolus administration of intravenous contrast. CONTRAST:  114mL ISOVUE-300 IOPAMIDOL (ISOVUE-300) INJECTION 61% COMPARISON:  05/08/2016 FINDINGS: Lower chest: Bronchiectasis and scarring in the visualized left lower lobe as before. No pleural or pericardial effusion. Hepatobiliary: No focal liver abnormality is seen. No gallstones, gallbladder wall thickening, or biliary dilatation. Pancreas: Unremarkable. No pancreatic ductal dilatation or surrounding inflammatory changes. Spleen: Normal in size without  focal abnormality. Adrenals/Urinary Tract: Normal adrenals. Benign appearing left renal cysts as before. No enhancing renal mass. No hydronephrosis. Urinary bladder physiologically distended, mildly thick-walled. Stomach/Bowel: Stomach is decompressed. Small bowel is nondilated. Normal appendix. The colon is nondilated, unremarkable. Vascular/Lymphatic: Calcified atheromatous plaque with extensive nonocclusive mural thrombus in the visualized descending thoracic, suprarenal and juxtarenal segments. Fusiform infrarenal aneurysm up to 3.5 cm transverse diameter (previously 3.4 cm 03/16/2016) with eccentric mildly occlusive mural thrombus, tapering to normal caliber above the bifurcation. Left renal artery ostial stent, patency indeterminate. Calcified ostial plaque involving the right renal artery resulting in high-grade stenosis or segmental occlusion. Heavily calcified ostial plaque and stenting through the length of the left common and external iliac arteries, patency indeterminate. Partially calcified plaque at the origin of the right common iliac artery resulting in stenosis of possible hemodynamic significance. Vascular stents through the right external iliac artery of indeterminate patency. Extensive partially calcified plaque through the common femoral arteries which are patent. No definite abdominal or pelvic adenopathy Reproductive: Moderate prostatic enlargement with coarse calcifications. Other: No ascites. No free air. Musculoskeletal: Moderate left rectus sheath hematoma without evidence of active arterial extravasation. Stable benign bone island in the left iliac bone near the SI joint. No fracture or worrisome bone lesion. IMPRESSION: 1. Moderate left rectus sheath hematoma without active arterial extravasation. 2. 3.5 cm infrarenal abdominal aortic aneurysm, increased from 3.4 cm 03/16/2016. 3. Prostatic enlargement with thick-walled urinary bladder suggesting a degree of bladder outlet obstruction.  4. Extensive bilateral iliac arterial stents, of indeterminate patency. Correlate with clinical symptomatology and ABIs. Electronically Signed   By: Lucrezia Europe M.D.   On: 01/28/2018 15:11        Scheduled Meds: . amLODipine  5 mg Oral Daily  . atorvastatin  20 mg Oral Daily  . budesonide (PULMICORT) nebulizer solution  0.25 mg Nebulization BID  . chlorpheniramine-HYDROcodone  5 mL Oral Q12H  . cholecalciferol  2,000 Units Oral Daily  . doxycycline  100 mg Oral Q12H  . famotidine  20 mg Oral Daily  . finasteride  5 mg Oral Daily  . insulin aspart  0-9 Units Subcutaneous TID WC  . ipratropium-albuterol  3 mL Nebulization QID  . loratadine  10 mg Oral Daily  . magnesium oxide  400 mg Oral Daily  . methylPREDNISolone (SOLU-MEDROL) injection  60 mg Intravenous Q8H  . nebivolol  10 mg Oral Daily  . pantoprazole  40 mg Oral Daily  . tamsulosin  0.4 mg Oral Daily  . umeclidinium-vilanterol  1 puff Inhalation Daily   Continuous Infusions:   LOS: 1 day    Time spent: 35 minutes     Elmarie Shiley, MD [facility] Pager [phone]  If 7PM-7AM,  please contact night-coverage [email or web address] Password [email or web address] 01/18/2018, 1:50 PM

## 2018-01-18 NOTE — Plan of Care (Signed)
Pt was educated on [place] and he and wife expressed understanding. Pt states he feels better and less SOB.

## 2018-01-19 ENCOUNTER — Inpatient Hospital Stay (HOSPITAL_COMMUNITY): Payer: Medicare Other

## 2018-01-19 DIAGNOSIS — J441 Chronic obstructive pulmonary disease with (acute) exacerbation: Secondary | ICD-10-CM

## 2018-01-19 LAB — BASIC METABOLIC PANEL
Anion gap: 11 (ref 5–15)
BUN: 33 mg/dL — ABNORMAL HIGH (ref 8–23)
CALCIUM: 8.7 mg/dL — AB (ref 8.9–10.3)
CO2: 23 mmol/L (ref 22–32)
CREATININE: 1.29 mg/dL — AB (ref 0.61–1.24)
Chloride: 105 mmol/L (ref 98–111)
GFR calc Af Amer: 59 mL/min — ABNORMAL LOW (ref >60)
GFR calc non Af Amer: 51 mL/min — ABNORMAL LOW (ref >60)
Glucose, Bld: 219 mg/dL — ABNORMAL HIGH (ref 70–99)
Potassium: 4.4 mmol/L (ref 3.5–5.1)
Sodium: 139 mmol/L (ref 135–145)

## 2018-01-19 LAB — CBC
HCT: 31 % — ABNORMAL LOW (ref 39.0–52.0)
Hemoglobin: 9.6 g/dL — ABNORMAL LOW (ref 13.0–17.0)
MCH: 29.1 pg (ref 26.0–34.0)
MCHC: 31 g/dL (ref 30.0–36.0)
MCV: 93.9 fL (ref 80.0–100.0)
Platelets: 196 10*3/uL (ref 150–400)
RBC: 3.3 MIL/uL — ABNORMAL LOW (ref 4.22–5.81)
RDW: 15.4 % (ref 11.5–15.5)
WBC: 15.1 10*3/uL — AB (ref 4.0–10.5)
nRBC: 0 % (ref 0.0–0.2)

## 2018-01-19 LAB — HEMOGLOBIN AND HEMATOCRIT, BLOOD
HCT: 32.2 % — ABNORMAL LOW (ref 39.0–52.0)
HCT: 33.5 % — ABNORMAL LOW (ref 39.0–52.0)
HEMOGLOBIN: 10.4 g/dL — AB (ref 13.0–17.0)
Hemoglobin: 9.9 g/dL — ABNORMAL LOW (ref 13.0–17.0)

## 2018-01-19 LAB — RESPIRATORY PANEL BY PCR
Adenovirus: NOT DETECTED
BORDETELLA PERTUSSIS-RVPCR: NOT DETECTED
Chlamydophila pneumoniae: NOT DETECTED
Coronavirus 229E: NOT DETECTED
Coronavirus HKU1: NOT DETECTED
Coronavirus NL63: NOT DETECTED
Coronavirus OC43: NOT DETECTED
Influenza A: NOT DETECTED
Influenza B: NOT DETECTED
METAPNEUMOVIRUS-RVPPCR: NOT DETECTED
Mycoplasma pneumoniae: NOT DETECTED
PARAINFLUENZA VIRUS 2-RVPPCR: NOT DETECTED
Parainfluenza Virus 1: NOT DETECTED
Parainfluenza Virus 3: NOT DETECTED
Parainfluenza Virus 4: NOT DETECTED
Respiratory Syncytial Virus: NOT DETECTED
Rhinovirus / Enterovirus: NOT DETECTED

## 2018-01-19 LAB — GLUCOSE, CAPILLARY
Glucose-Capillary: 162 mg/dL — ABNORMAL HIGH (ref 70–99)
Glucose-Capillary: 227 mg/dL — ABNORMAL HIGH (ref 70–99)
Glucose-Capillary: 197 mg/dL — ABNORMAL HIGH (ref 70–99)
Glucose-Capillary: 160 mg/dL — ABNORMAL HIGH (ref 70–99)

## 2018-01-19 MED ORDER — SIMETHICONE 80 MG PO CHEW
80.0000 mg | CHEWABLE_TABLET | Freq: Four times a day (QID) | ORAL | Status: AC
  Administered 2018-01-19 – 2018-01-21 (×7): 80 mg via ORAL
  Filled 2018-01-19 (×7): qty 1

## 2018-01-19 MED ORDER — GUAIFENESIN ER 600 MG PO TB12
600.0000 mg | ORAL_TABLET | Freq: Two times a day (BID) | ORAL | Status: DC
  Administered 2018-01-19 – 2018-01-20 (×3): 600 mg via ORAL
  Filled 2018-01-19 (×3): qty 1

## 2018-01-19 MED ORDER — BISACODYL 10 MG RE SUPP
10.0000 mg | Freq: Once | RECTAL | Status: AC
  Administered 2018-01-20: 10 mg via RECTAL
  Filled 2018-01-19: qty 1

## 2018-01-19 MED ORDER — BISACODYL 10 MG RE SUPP
10.0000 mg | Freq: Once | RECTAL | Status: DC
  Filled 2018-01-19: qty 1

## 2018-01-19 MED ORDER — POLYETHYLENE GLYCOL 3350 17 G PO PACK
17.0000 g | PACK | Freq: Two times a day (BID) | ORAL | Status: DC
  Administered 2018-01-19 – 2018-01-25 (×12): 17 g via ORAL
  Filled 2018-01-19 (×13): qty 1

## 2018-01-19 MED ORDER — PANTOPRAZOLE SODIUM 40 MG IV SOLR
40.0000 mg | Freq: Two times a day (BID) | INTRAVENOUS | Status: DC
  Administered 2018-01-19: 40 mg via INTRAVENOUS
  Filled 2018-01-19 (×2): qty 40

## 2018-01-19 MED ORDER — DOCUSATE SODIUM 100 MG PO CAPS
100.0000 mg | ORAL_CAPSULE | Freq: Two times a day (BID) | ORAL | Status: DC
  Administered 2018-01-19 – 2018-01-25 (×12): 100 mg via ORAL
  Filled 2018-01-19 (×12): qty 1

## 2018-01-19 MED ORDER — GUAIFENESIN-CODEINE 100-10 MG/5ML PO SOLN
5.0000 mL | Freq: Four times a day (QID) | ORAL | Status: DC | PRN
  Administered 2018-01-20: 5 mL via ORAL
  Filled 2018-01-19: qty 5

## 2018-01-19 MED ORDER — SENNA 8.6 MG PO TABS
1.0000 | ORAL_TABLET | Freq: Every day | ORAL | Status: DC
  Administered 2018-01-19 – 2018-01-25 (×6): 8.6 mg via ORAL
  Filled 2018-01-19 (×7): qty 1

## 2018-01-19 MED ORDER — BENZONATATE 100 MG PO CAPS
200.0000 mg | ORAL_CAPSULE | Freq: Three times a day (TID) | ORAL | Status: DC
  Administered 2018-01-19 – 2018-02-01 (×39): 200 mg via ORAL
  Filled 2018-01-19 (×40): qty 2

## 2018-01-19 NOTE — Consult Note (Signed)
NAME:  Douglas Monroe, MRN:  [id number], DOB:  1932-04-16, LOS: 2 ADMISSION DATE:  01/06/2018, CONSULTATION DATE:  01/19/2018 REFERRING MD:  Tyrell Antonio, CHIEF COMPLAINT:  COPD exacerbation   Brief History   Patient was admitted to the hospital with complaints of protracted coughing and bruising on his left lower abdomen Patient of [name]- followed for chronic respiratory failure and severe chronic obstructive pulmonary disease   History of present illness   Was recently treated for upper respiratory infection he did follow-up with his primary doctor and then ended up in the ED a couple of days later He had persistent cough and wheezing which improved on amoxicillin, prednisone, antitussives The coughing and wheezing did recur honey was coughing vigorously-subsequently noted the abdominal wall hematoma secondary to having severe pain and discomfort Came into the hospital for further evaluation  Past Medical History  Advanced chronic obstructive pulmonary disease Chronic exertional dyspnea Peripheral vascular disease Hypertension Hyperlipidemia History of kidney stones First-degree heart block History of anxiety History of tubular adenoma of the colon  Significant Hospital Events   Has been relatively stable in the hospital with slight improvement in symptoms  Consults:  PCCM on 01/19/2018 Surgical input regarding his abdominal wall hematoma  Procedures:    Significant Diagnostic Tests:  Chest x-ray shows no acute infiltrate  Micro Data:    Antimicrobials:   Currently on doxycycline  Interim history/subjective:  Slight improvement in his symptoms Still wheezing with shortness of breath with activity Abdominal wall pain is better  Objective   Blood pressure (!) 144/71, pulse 79, temperature 97.8 F (36.6 C), temperature source Oral, resp. rate 18, height 5\' 4"  (1.626 m), weight 71 kg, SpO2 94 %.        Intake/Output Summary (Last 24 hours) at 01/19/2018  1220 Last data filed at 01/19/2018 0600 Gross per 24 hour  Intake 480 ml  Output 1200 ml  Net -720 ml   Filed Weights   01/18/18 1849  Weight: 71 kg    Examination: General: Elderly gentleman,not in obvious distress HENT: Moist oral mucosa Lungs: Wheezing bibasilarly Cardiovascular: S1-S2 appreciated Abdomen: Abdomen is soft bowel sounds normoactive no organomegaly Extensive bruising noted left lower abdominal wall into his back Extremities: No edema, no clubbing Neuro: Alert oriented x3, moving all extremities   Resolved Hospital Problem list    Assessment & Plan:   Severe COPD -Most recent PFT did reveal severe obstruction with severe reduction in diffusing capacity - COPD exacerbation -Exacerbation of his baseline COPD probably viral in nature -Continues on bronchodilators -Continues on steroids- on solumedrol -Complete course of antibiotics  Abdominal wall hematoma secondary to protracted coughing -Optimize antitussives -Added Tessalon Perles 200 p.o. 3 times daily -Optimize pain management  Hypertension -Continue current medications  Best practice:  Diet: Cardiac diet Pain/Anxiety/Delirium protocol (if indicated): Pain management per primary DVT prophylaxis: SCDs Mobility: As tolerated Code Status: Full code Family Communication: Updated family at bedside Disposition:   Labs   CBC: Recent Labs  Lab 01/22/2018 1234  01/18/18 0239 01/18/18 0855 01/18/18 1513 01/19/18 0402 01/19/18 0933  WBC 9.5  --   --   --   --  15.1*  --   NEUTROABS 7.1  --   --   --   --   --   --   HGB 11.1*   < > 10.5* 10.1* 10.1* 9.6* 9.9*  HCT 35.0*   < > 33.5* 31.9* 32.2* 31.0* 32.2*  MCV 90.4  --   --   --   --  93.9  --   PLT 187  --   --   --   --  196  --    < > = values in this interval not displayed.    Basic Metabolic Panel: Recent Labs  Lab 01/11/2018 1234 01/18/18 0239 01/19/18 0402  NA 139 138 139  K 3.2* 4.2 4.4  CL 104 105 105  CO2 25 24 23   GLUCOSE  113* 208* 219*  BUN 23 28* 33*  CREATININE 1.14 1.30* 1.29*  CALCIUM 8.6* 8.5* 8.7*   GFR: Estimated Creatinine Clearance: 36.3 mL/min (A) (by C-G formula based on SCr of 1.29 mg/dL (H)). Recent Labs  Lab 01/16/2018 1234 01/19/18 0402  WBC 9.5 15.1*    Liver Function Tests: Recent Labs  Lab 01/20/2018 1234  AST 22  ALT 16  ALKPHOS 89  BILITOT 1.4*  PROT 6.6  ALBUMIN 3.4*   No results for input(s): LIPASE, AMYLASE in the last 168 hours. No results for input(s): AMMONIA in the last 168 hours.  ABG    Component Value Date/Time   HCO3 28.1 (H) 10/30/2006 2355   TCO2 25 08/20/2016 0351     Coagulation Profile: Recent Labs  Lab 01/02/2018 1234  INR 0.89    Cardiac Enzymes: Recent Labs  Lab 01/30/2018 1234  TROPONINI <0.03    HbA1C: Hgb A1c MFr Bld  Date/Time Value Ref Range Status  11/08/2017 12:04 PM 6.7 (H) 4.8 - 5.6 % Final    Comment:             Prediabetes: 5.7 - 6.4          Diabetes: >6.4          Glycemic control for adults with diabetes: <7.0   05/02/2017 6.4 (A) 4.0 - 6.0 Final    CBG: Recent Labs  Lab 01/18/18 1125 01/18/18 1633 01/18/18 2058 01/19/18 0753 01/19/18 1159  GLUCAP 225* 193* 203* 162* 227*    Review of Systems:   Review of Systems  Constitutional: Negative for malaise/fatigue and weight loss.  HENT: Negative.   Eyes: Negative.   Respiratory: Positive for cough, shortness of breath and wheezing.   Cardiovascular: Negative.  Negative for chest pain.  Gastrointestinal: Positive for abdominal pain.  Genitourinary: Negative.   Musculoskeletal: Negative.   Skin: Negative.   All other systems reviewed and are negative.    Past Medical History  He,  has a past medical history of Abdominal aortic aneurysm ([place]), Allergy to alpha-gal, Anxiety, Arthritis, Cancer of skin of temple (2017), COPD with emphysema ([place]), Dyspnea, Exertional dyspnea, First degree heart block, GERD (gastroesophageal reflux disease), Headache(784.0),  History of kidney stones, HOH (hard of hearing), HTN (hypertension) (08/20/2011), Hyperlipidemia, Hypertension, Ischemic colitis ([place]), Left carotid artery stenosis, Migraine, Occlusion of right vertebral artery without cerebral infarction, PVD (peripheral vascular disease) with claudication ([place]) (cardiologist-  dr berry), Right ureteral stone, S/P arterial stent, Sleep apnea, Tubular adenoma of colon, and Urticaria.   Surgical History    Past Surgical History:  Procedure Laterality Date  . ABDOMINAL ANGIOGRAM  08/19/2011   Left common iliac artery at the ostium, 6x78mm ICAST covered stent, common femoral artery, 8x135mm Abbott absolute pro nitinol self-expanding stent and resulting in reduction of 80 and 90% stenosis to 0% residual  . ABDOMINAL ANGIOGRAM  10/08/2010   Mid-right SFA, 6x120 EV3 Protege nitinol self-expanding stent, resulting in reduction of CTO to 0% residual  . ABDOMINAL ANGIOGRAM  06/22/2010   Rt Common Femoral Artery-8x3 Absolute Pro Nitinol self-expanding stent-90%  stenosis to 0% residual; Rt External Iliac Artery-9x3 Absolute Pro Nitinol self-expanding stent-70% stenosis to 0% residual; Left External Iliac Artery-8x4 Absolute Pro Nitinol self-expanding stent-90% to 0% residual  . ATHERECTOMY N/A 08/19/2011   Procedure: ATHERECTOMY;  Surgeon: Lorretta Harp, MD;  Location: [facility] CATH LAB;  Service: Cardiovascular;  Laterality: N/A;  . BACK SURGERY    . CARDIAC CATHETERIZATION  10/26/2002  dr Tami Ribas   normal coronary arteries/ normal  LVF  . CARDIOVASCULAR STRESS TEST  06-12-2010   dr berry   normal perfusion study/ no ischemia/  ef 69%  . COLONOSCOPY WITH PROPOFOL N/A 03/19/2016   Procedure: COLONOSCOPY WITH PROPOFOL;  Surgeon: Mauri Pole, MD;  Location: [facility];  Service: Endoscopy;  Laterality: N/A;  was inpatient, but got d/c'd home and is coming back as an OP  . CYSTOSCOPY W/ URETERAL STENT PLACEMENT Left 06/07/2016   Procedure: CYSTOSCOPY WITH RETROGRADE  PYELOGRAM/URETERAL STENT REPLACEMENT;  Surgeon: Cleon Gustin, MD;  Location: [facility];  Service: Urology;  Laterality: Left;  . CYSTOSCOPY W/ URETERAL STENT PLACEMENT Left 09/20/2016   Procedure: CYSTOSCOPY WITH RETROGRADE PYELOGRAM/URETERAL STENT PLACEMENT;  Surgeon: Cleon Gustin, MD;  Location: [facility];  Service: Urology;  Laterality: Left;  . CYSTOSCOPY WITH RETROGRADE PYELOGRAM, URETEROSCOPY AND STENT PLACEMENT Right 10/10/2013   Procedure: uretheral dilation, CYSTOSCOPY, URETEROSCOPY, stone extraction, STENT PLACEMENT;  Surgeon: [name], MD;  [facility];  Service: Urology;  Laterality: Right;  . CYSTOSCOPY WITH RETROGRADE PYELOGRAM, URETEROSCOPY AND STENT PLACEMENT Left 02/08/2017   Procedure: CYSTOSCOPY WITH RETROGRADE PYELOGRAM, URETEROSCOPY;  Surgeon: Cleon Gustin, MD;  Location: [facility];  Service: Urology;  Laterality: Left;  . CYSTOSCOPY WITH RETROGRADE PYELOGRAM, URETEROSCOPY AND STENT PLACEMENT Left 05/16/2017   Procedure: CYSTOSCOPY WITH RETROGRADE PYELOGRAM, URETEROSCOPY WITH ABLATION OF URETERAL TUMOR, BLADDER BIOPSY AND FULGURATION, AND STENT PLACEMENT;  Surgeon: Cleon Gustin, MD;  Location: [facility];  Service: Urology;  Laterality: Left;  . CYSTOSCOPY WITH RETROGRADE PYELOGRAM, URETEROSCOPY AND STENT PLACEMENT Left 06/13/2017   Procedure: CYSTOSCOPY WITH RETROGRADE PYELOGRAM, URETEROSCOPY STENT EXCHANGE;  Surgeon: Cleon Gustin, MD;  Location: [facility];  Service: Urology;  Laterality: Left;  . CYSTOSCOPY WITH RETROGRADE PYELOGRAM, URETEROSCOPY AND STENT PLACEMENT Left 10/31/2017   Procedure: CYSTOSCOPY WITH RETROGRADE PYELOGRAM, URETEROSCOPY ,FULGURATION OF URETERAL TUMOR AND STENT PLACEMENT;  Surgeon: Cleon Gustin, MD;  Location: [facility];  Service: Urology;  Laterality: Left;  1 HR POSSIBLE STENT PLACEMENT, ALSO POSSIBLE TUMOR ABLATION  . CYSTOSCOPY WITH URETEROSCOPY Left 05/09/2016   Procedure: CYSTOSCOPY, RETROGRADE, LEFT  URETEROSCOPY, LEFT URETERAL BIOPSIES, HOLMIUM LASER FULGERATION, LEFT URETERAL STENT PLACEMENT;  Surgeon: Irine Seal, MD;  Location: [facility];  Service: Urology;  Laterality: Left;  . CYSTOSCOPY/URETEROSCOPY/HOLMIUM LASER Left 09/20/2016   Procedure: CYSTOSCOPY/URETEROSCOPY/HOLMIUM LASER;  Surgeon: Cleon Gustin, MD;  Location: [facility];  Service: Urology;  Laterality: Left;  . HOLMIUM LASER APPLICATION Right 07/04/9526   Procedure: HOLMIUM LASER APPLICATION;  Surgeon: [name], MD;  Location: [facility];  Service: Urology;  Laterality: Right;  . HOLMIUM LASER APPLICATION Left 05/03/3242   Procedure: TUMOR ABLATION WITH HOLMIUM LASER;  Surgeon: Cleon Gustin, MD;  Location: [facility];  Service: Urology;  Laterality: Left;  . LUMBAR DISC SURGERY  1970's  . RENAL ANGIOGRAM N/A 11/02/2012   Procedure: RENAL ANGIOGRAM;  Surgeon: Lorretta Harp, MD;  Location: [facility] CATH LAB;  Service: Cardiovascular;  Laterality: N/A;  . RENAL ARTERY STENT Left 11/02/2012  .  SKIN CANCER EXCISION Right 2017   temple  . SOFT TISSUE CYST EXCISION  1960's   "outside part of lung LLL"  . TRANSURETHRAL RESECTION OF BLADDER TUMOR N/A 06/13/2017   Procedure: TRANSURETHRAL RESECTION OF BLADDER TUMOR (TURBT);  Surgeon: Cleon Gustin, MD;  Location: [facility];  Service: Urology;  Laterality: N/A;     Social History   reports that he quit smoking about 7 years ago. His smoking use included cigarettes. He has a 60.00 pack-year smoking history. He has never used smokeless tobacco. He reports that he does not drink alcohol or use drugs.   Family History   His family history includes Cancer in his father; Diabetes in his brother; Hypertension in his mother and son.   Allergies Allergies  Allergen Reactions  . Meat Extract Other (See Comments)    Alpha gal, red meat allergy   . Pneumococcal Vaccines Swelling and Other (See Comments)    Lip swelling     Home Medications  Prior to Admission  medications   Medication Sig Start Date End Date Taking? Authorizing Provider  albuterol (PROVENTIL HFA;VENTOLIN HFA) 108 (90 Base) MCG/ACT inhaler Inhale 2 puffs into the lungs every 6 (six) hours as needed for wheezing or shortness of breath.   Yes [provider]  albuterol (PROVENTIL) (2.5 MG/3ML) 0.083% nebulizer solution Take 2.5 mg by nebulization every 6 (six) hours as needed for wheezing or shortness of breath.   Yes [provider]  amLODipine (NORVASC) 5 MG tablet TAKE 1 TABLET BY MOUTH  DAILY 01/16/18  Yes Glendale Chard, MD  Aspirin-Acetaminophen-Caffeine (GOODY HEADACHE PO) Take 1 packet by mouth daily as needed (for headache).   Yes [provider]  atorvastatin (LIPITOR) 20 MG tablet TAKE 1 TABLET BY MOUTH  DAILY AT 6 PM. Patient taking differently: Take 20 mg by mouth every morning. Patient takes in the morning 07/13/17  Yes Lorretta Harp, MD  BYSTOLIC 10 MG tablet Take 10 mg by mouth daily. 02/23/16  Yes [provider]  chlorpheniramine-HYDROcodone (TUSSIONEX PENNKINETIC ER) 10-8 MG/5ML SUER Take 5 mLs by mouth every 12 (twelve) hours as needed for cough. 01/11/18  Yes Asencion Noble, MD  clopidogrel (PLAVIX) 75 MG tablet TAKE 1 TABLET BY MOUTH  EVERY DAY 07/13/17  Yes Lorretta Harp, MD  [employer] D3 2000 units CAPS Take 2,000 Units by mouth daily.  01/31/16  Yes [provider]  [employer] MAGNESIUM OXIDE 500 MG TABS TAKE 1 TABLET BY MOUTH EVERY DAY 11/21/17  Yes Glendale Chard, MD  finasteride (PROSCAR) 5 MG tablet Take 5 mg by mouth daily.    Yes [provider]  Fluticasone-Umeclidin-Vilant (TRELEGY ELLIPTA) 100-62.5-25 MCG/INH AEPB Inhale 1 puff into the lungs daily. 02/22/17  Yes Mannam, Praveen, MD  loratadine (CLARITIN) 10 MG tablet Take 10 mg by mouth daily.   Yes [provider]  pantoprazole (PROTONIX) 40 MG tablet TAKE 1 TABLET BY MOUTH EVERY DAY 11/04/17  Yes Glendale Chard, MD  ranitidine (ZANTAC) 150 MG tablet  TAKE 1 TABLET BY MOUTH TWICE A DAY 11/21/17  Yes Glendale Chard, MD  tamsulosin (FLOMAX) 0.4 MG CAPS capsule Take 0.4 mg by mouth daily.  07/13/13  Yes [provider]  tiotropium (SPIRIVA) 18 MCG inhalation capsule Place 18 mcg into inhaler and inhale daily.   Yes [provider]  amoxicillin (AMOXIL) 875 MG tablet Take 1 tablet (875 mg total) by mouth 2 (two) times daily. Patient not taking: Reported on 01/05/2018 01/09/18  Minette Brine, FNP  EPINEPHrine 0.3 mg/0.3 mL IJ SOAJ injection Inject 0.3 mg into the muscle daily as needed (allergic reaction).  05/17/17   [provider]  predniSONE (STERAPRED UNI-PAK 21 TAB) 10 MG (21) TBPK tablet Take as directed Patient not taking: Reported on 01/27/2018 01/09/18   Minette Brine, [place]

## 2018-01-19 NOTE — Progress Notes (Signed)
Stopped by to see patient  Intermittent shortness of breath especially following a coughing fit  Denies having any pain or discomfort Able to press on his belly without having any significant pain  Reports being constipated Reports abdomen looking a little bit more distended KUB obtained- result noted  I do believe he is on optimal treatment at the present time He has severe obstructive lung disease at baseline and his abdominal distention may be contributing to a restrictive physiology exacerbating his shortness of breath  Add laxative-did have MiraLAX this morning Anti-gas medicine- simethicone added  Continue nebulization treatments and steroids  He may still be having intermittent bleeding into his abdominal wall with coughing fits

## 2018-01-19 NOTE — Progress Notes (Signed)
Pt abd tight and distended, c/o of abd pressures and bloating, SOB while talking and at rest.  VSs

## 2018-01-19 NOTE — Consult Note (Signed)
[facility] Consult Note  Douglas Monroe 1932/10/14  [id number].    Requesting MD: Regalado Chief Complaint/Reason for Consult: rectus hematoma  HPI:  Patient is an 84 year old male who is in the hospital for an acute COPD exacerbation. After a coughing spell noted abdomen to be bruised and sore. CT scan on 12/17 shows a moderate rectus hematoma with no active extravasation. Patient reports mild pain in left flank. Abdomen feels tight but denies nausea or vomiting, passing flatus. Denies feeling lightheaded or like he is having palpitations.   ROS: Review of Systems  Constitutional: Negative for chills and fever.  Respiratory: Positive for cough and shortness of breath.   Gastrointestinal: Positive for abdominal pain. Negative for constipation, diarrhea, nausea and vomiting.  Neurological: Negative for dizziness and weakness.  All other systems reviewed and are negative.   Family History  Problem Relation Age of Onset  . Hypertension Mother   . Cancer Father   . Diabetes Brother   . Hypertension Son     Past Medical History:  Diagnosis Date  . Abdominal aortic aneurysm ([place])    infrarenal -- monitored by dr berry (note states stable 4.0 to 4.1cm)  . Allergy to alpha-gal    per aptient and wife; reports he was diagnosed in spring 2019 after a bite form possibly a chigger or tick ;    . Anxiety    situational  . Arthritis    "hands" (03/15/2016)  . Cancer of skin of temple 2017   right  . COPD with emphysema ([place])   . Dyspnea    with exertion  . Exertional dyspnea   . First degree heart block   . GERD (gastroesophageal reflux disease)   . Headache(784.0)    uses goody powder; "maybe weekly, maybe not that much" (03/15/2016)  . History of kidney stones   . HOH (hard of hearing)    left ear; no hearing aid   . HTN (hypertension) 08/20/2011  . Hyperlipidemia   . Hypertension   . Ischemic colitis ([place])   . Left carotid artery stenosis    moderate  .  Migraine    "long time ago" (03/15/2016)  . 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 PVD (peripheral vascular disease) with claudication [facility]) cardiologist-  dr berry   s/p bilateral iliac stents and right sfa stenting/  doppper study 09-05-2012  revealed ABIs close to one bilaterally with patent stents/  stenting left renal artery stenosis   . Right ureteral stone   . S/P arterial stent    left renal angioplasty and stent 11-02-2012  . Sleep apnea    at risk for OSA. stop/bang score= 6; sent to PCP 10/04/13; denies use of mask on 03/15/2016  . Tubular adenoma of colon   . Urticaria     Past Surgical History:  Procedure Laterality Date  . ABDOMINAL ANGIOGRAM  08/19/2011   Left common iliac artery at the ostium, 6x60mm ICAST covered stent, common femoral artery, 8x151mm Abbott absolute pro nitinol self-expanding stent and resulting in reduction of 80 and 90% stenosis to 0% residual  . ABDOMINAL ANGIOGRAM  10/08/2010   Mid-right SFA, 6x120 EV3 Protege nitinol self-expanding stent, resulting in reduction of CTO to 0% residual  . ABDOMINAL ANGIOGRAM  06/22/2010   Rt Common Femoral Artery-8x3 Absolute Pro Nitinol self-expanding stent-90% stenosis to 0% residual; Rt External Iliac Artery-9x3 Absolute Pro Nitinol self-expanding stent-70% stenosis to 0% residual; Left External Iliac Artery-8x4 Absolute Pro Nitinol  self-expanding stent-90% to 0% residual  . ATHERECTOMY N/A 08/19/2011   Procedure: ATHERECTOMY;  Surgeon: Lorretta Harp, MD;  Location: [facility] CATH LAB;  Service: Cardiovascular;  Laterality: N/A;  . BACK SURGERY    . CARDIAC CATHETERIZATION  10/26/2002  dr Tami Ribas   normal coronary arteries/ normal  LVF  . CARDIOVASCULAR STRESS TEST  06-12-2010   dr berry   normal perfusion study/ no ischemia/  ef 69%  . COLONOSCOPY WITH PROPOFOL N/A 03/19/2016   Procedure: COLONOSCOPY WITH PROPOFOL;  Surgeon: Mauri Pole, MD;  Location: [facility];  Service: Endoscopy;   Laterality: N/A;  was inpatient, but got d/c'd home and is coming back as an OP  . CYSTOSCOPY W/ URETERAL STENT PLACEMENT Left 06/07/2016   Procedure: CYSTOSCOPY WITH RETROGRADE PYELOGRAM/URETERAL STENT REPLACEMENT;  Surgeon: Cleon Gustin, MD;  Location: [facility];  Service: Urology;  Laterality: Left;  . CYSTOSCOPY W/ URETERAL STENT PLACEMENT Left 09/20/2016   Procedure: CYSTOSCOPY WITH RETROGRADE PYELOGRAM/URETERAL STENT PLACEMENT;  Surgeon: Cleon Gustin, MD;  Location: [facility];  Service: Urology;  Laterality: Left;  . CYSTOSCOPY WITH RETROGRADE PYELOGRAM, URETEROSCOPY AND STENT PLACEMENT Right 10/10/2013   Procedure: uretheral dilation, CYSTOSCOPY, URETEROSCOPY, stone extraction, STENT PLACEMENT;  Surgeon: [name], MD;  Location: [facility];  Service: Urology;  Laterality: Right;  . CYSTOSCOPY WITH RETROGRADE PYELOGRAM, URETEROSCOPY AND STENT PLACEMENT Left 02/08/2017   Procedure: CYSTOSCOPY WITH RETROGRADE PYELOGRAM, URETEROSCOPY;  Surgeon: Cleon Gustin, MD;  Location[facility];  Service: Urology;  Laterality: Left;  . CYSTOSCOPY WITH RETROGRADE PYELOGRAM, URETEROSCOPY AND STENT PLACEMENT Left 05/16/2017   Procedure: CYSTOSCOPY WITH RETROGRADE PYELOGRAM, URETEROSCOPY WITH ABLATION OF URETERAL TUMOR, BLADDER BIOPSY AND FULGURATION, AND STENT PLACEMENT;  Surgeon: Cleon Gustin, MD;  Location: [facility];  Service: Urology;  Laterality: Left;  . CYSTOSCOPY WITH RETROGRADE PYELOGRAM, URETEROSCOPY AND STENT PLACEMENT Left 06/13/2017   Procedure: CYSTOSCOPY WITH RETROGRADE PYELOGRAM, URETEROSCOPY STENT EXCHANGE;  Surgeon: Cleon Gustin, MD;  Location[facility];  Service: Urology;  Laterality: Left;  . CYSTOSCOPY WITH RETROGRADE PYELOGRAM, URETEROSCOPY AND STENT PLACEMENT Left 10/31/2017   Procedure: CYSTOSCOPY WITH RETROGRADE PYELOGRAM, URETEROSCOPY ,FULGURATION OF URETERAL TUMOR AND STENT PLACEMENT;  Surgeon: Cleon Gustin, MD;  Location: [facility];  Service:  Urology;  Laterality: Left;  1 HR POSSIBLE STENT PLACEMENT, ALSO POSSIBLE TUMOR ABLATION  . CYSTOSCOPY WITH URETEROSCOPY Left 05/09/2016   Procedure: CYSTOSCOPY, RETROGRADE, LEFT URETEROSCOPY, LEFT URETERAL BIOPSIES, HOLMIUM LASER FULGERATION, LEFT URETERAL STENT PLACEMENT;  Surgeon: Irine Seal, MD;  Location: [facility];  Service: Urology;  Laterality: Left;  . CYSTOSCOPY/URETEROSCOPY/HOLMIUM LASER Left 09/20/2016   Procedure: CYSTOSCOPY/URETEROSCOPY/HOLMIUM LASER;  Surgeon: Cleon Gustin, MD;  [facility];  Service: Urology;  Laterality: Left;  . HOLMIUM LASER APPLICATION Right 02/06/1094   Procedure: HOLMIUM LASER APPLICATION;  Surgeon: [name], MD;  [facility];  Service: Urology;  Laterality: Right;  . HOLMIUM LASER APPLICATION Left 0/05/5407   Procedure: TUMOR ABLATION WITH HOLMIUM LASER;  Surgeon: Cleon Gustin, MD;  [facility];  Service: Urology;  Laterality: Left;  . LUMBAR DISC SURGERY  1970's  . RENAL ANGIOGRAM N/A 11/02/2012   Procedure: RENAL ANGIOGRAM;  Surgeon: Lorretta Harp, MD;  Location: [facility] CATH LAB;  Service: Cardiovascular;  Laterality: N/A;  . RENAL ARTERY STENT Left 11/02/2012  . SKIN CANCER EXCISION Right 2017   temple  . SOFT TISSUE CYST EXCISION  1960's   "outside part of lung LLL"  .  TRANSURETHRAL RESECTION OF BLADDER TUMOR N/A 06/13/2017   Procedure: TRANSURETHRAL RESECTION OF BLADDER TUMOR (TURBT);  Surgeon: Cleon Gustin, MD;  Location[facility];  Service: Urology;  Laterality: N/A;    Social History:  reports that he quit smoking about 7 years ago. His smoking use included cigarettes. He has a 60.00 pack-year smoking history. He has never used smokeless tobacco. He reports that he does not drink alcohol or use drugs.  Allergies:  Allergies  Allergen Reactions  . Meat Extract Other (See Comments)    Alpha gal, red meat allergy   . Pneumococcal Vaccines Swelling and Other (See Comments)    Lip swelling     Medications Prior to Admission  Medication Sig Dispense Refill  . albuterol (PROVENTIL HFA;VENTOLIN HFA) 108 (90 Base) MCG/ACT inhaler Inhale 2 puffs into the lungs every 6 (six) hours as needed for wheezing or shortness of breath.    [name] albuterol (PROVENTIL) (2.5 MG/3ML) 0.083% nebulizer solution Take 2.5 mg by nebulization every 6 (six) hours as needed for wheezing or shortness of breath.    [name] amLODipine (NORVASC) 5 MG tablet TAKE 1 TABLET BY MOUTH  DAILY 90 tablet 1  . Aspirin-Acetaminophen-Caffeine (GOODY HEADACHE PO) Take 1 packet by mouth daily as needed (for headache).    [name] atorvastatin (LIPITOR) 20 MG tablet TAKE 1 TABLET BY MOUTH  DAILY AT 6 PM. (Patient taking differently: Take 20 mg by mouth every morning. Patient takes in the morning) 90 tablet 3  . BYSTOLIC 10 MG tablet Take 10 mg by mouth daily.    . chlorpheniramine-HYDROcodone (TUSSIONEX PENNKINETIC ER) 10-8 MG/5ML SUER Take 5 mLs by mouth every 12 (twelve) hours as needed for cough. 70 mL 0  . clopidogrel (PLAVIX) 75 MG tablet TAKE 1 TABLET BY MOUTH  EVERY DAY 90 tablet 3  . [employer]S D3 2000 units CAPS Take 2,000 Units by mouth daily.   5  . CVS MAGNESIUM OXIDE 500 MG TABS TAKE 1 TABLET BY MOUTH EVERY DAY 30 tablet 4  . finasteride (PROSCAR) 5 MG tablet Take 5 mg by mouth daily.     . Fluticasone-Umeclidin-Vilant (TRELEGY ELLIPTA) 100-62.5-25 MCG/INH AEPB Inhale 1 puff into the lungs daily. 60 each 6  . loratadine (CLARITIN) 10 MG tablet Take 10 mg by mouth daily.    . pantoprazole (PROTONIX) 40 MG tablet TAKE 1 TABLET BY MOUTH EVERY DAY 90 tablet 1  . ranitidine (ZANTAC) 150 MG tablet TAKE 1 TABLET BY MOUTH TWICE A DAY 60 tablet 3  . tamsulosin (FLOMAX) 0.4 MG CAPS capsule Take 0.4 mg by mouth daily.     [name] tiotropium (SPIRIVA) 18 MCG inhalation capsule Place 18 mcg into inhaler and inhale daily.    [name] amoxicillin (AMOXIL) 875 MG tablet Take 1 tablet (875 mg total) by mouth 2 (two) times daily. (Patient not taking: Reported on  01/10/2018) 14 tablet 0  . EPINEPHrine 0.3 mg/0.3 mL IJ SOAJ injection Inject 0.3 mg into the muscle daily as needed (allergic reaction).   1  . predniSONE (STERAPRED UNI-PAK 21 TAB) 10 MG (21) TBPK tablet Take as directed (Patient not taking: Reported on 01/29/2018) 21 tablet 0    Blood pressure (!) 144/71, pulse 79, temperature 97.8 F (36.6 C), temperature source Oral, resp. rate 18, height 5\' 4"  (1.626 m), weight 71 kg, SpO2 94 %. Physical Exam: Physical Exam Constitutional:      General: He is not in acute distress.    Appearance: He is well-developed and normal weight.  HENT:     Head: Normocephalic and atraumatic.     Right Ear: External ear normal.     Left Ear: External ear normal.     Nose: Nose normal.     Mouth/Throat:     Lips: Pink.     Mouth: Mucous membranes are moist.  Eyes:     General: Lids are normal.     Extraocular Movements: Extraocular movements intact.     Conjunctiva/sclera: Conjunctivae normal.  Neck:     Musculoskeletal: Normal range of motion and neck supple.  Cardiovascular:     Rate and Rhythm: Normal rate and regular rhythm.  Pulmonary:     Effort: No respiratory distress.     Breath sounds: Wheezing (bilaterally ) present.  Abdominal:     General: Abdomen is protuberant. Bowel sounds are normal. There is no distension.     Tenderness: There is abdominal tenderness (mild) in the left upper quadrant and left lower quadrant. There is no guarding or rebound.  Musculoskeletal:     Comments: ROM grossly intact in bilateral upper and lower extremities  Skin:    Findings: Ecchymosis (left abdomen and flank) present.  Neurological:     Mental Status: He is alert and oriented to person, place, and time.  Psychiatric:        Mood and Affect: Mood and affect normal.        Speech: Speech normal.        Behavior: Behavior is cooperative.     Results for orders placed or performed during the hospital encounter of 01/19/2018 (from the past 48 hour(s))   CBC with Differential     Status: Abnormal   Collection Time: 01/19/2018 12:34 PM  Result Value Ref Range   WBC 9.5 4.0 - 10.5 K/uL   RBC 3.87 (L) 4.22 - 5.81 MIL/uL   Hemoglobin 11.1 (L) 13.0 - 17.0 g/dL   HCT 35.0 (L) 39.0 - 52.0 %   MCV 90.4 80.0 - 100.0 fL   MCH 28.7 26.0 - 34.0 pg   MCHC 31.7 30.0 - 36.0 g/dL   RDW 15.6 (H) 11.5 - 15.5 %   Platelets 187 150 - 400 K/uL    Comment: PLATELET COUNT CONFIRMED BY SMEAR   nRBC 0.0 0.0 - 0.2 %   Neutrophils Relative % 77 %   Neutro Abs 7.1 1.7 - 7.7 K/uL   Lymphocytes Relative 12 %   Lymphs Abs 1.2 0.7 - 4.0 K/uL   Monocytes Relative 9 %   Monocytes Absolute 0.9 0.1 - 1.0 K/uL   Eosinophils Relative 1 %   Eosinophils Absolute 0.1 0.0 - 0.5 K/uL   Basophils Relative 0 %   Basophils Absolute 0.0 0.0 - 0.1 K/uL   Smear Review PLATLET CLUMPS NOTED ON SMEAR    Immature Granulocytes 1 %   Abs Immature Granulocytes 0.12 (H) 0.00 - 0.07 K/uL    Comment: Performed at [facility], [place] [address], [place], [place] [address]  Comprehensive metabolic panel     Status: Abnormal   Collection Time: 01/10/2018 12:34 PM  Result Value Ref Range   Sodium 139 135 - 145 mmol/L   Potassium 3.2 (L) 3.5 - 5.1 mmol/L   Chloride 104 98 - 111 mmol/L   CO2 25 22 - 32 mmol/L   Glucose, Bld 113 (H) 70 - 99 mg/dL   BUN 23 8 - 23 mg/dL   Creatinine, Ser 1.14 0.61 - 1.24 mg/dL   Calcium 8.6 (L) 8.9 -  10.3 mg/dL   Total Protein 6.6 6.5 - 8.1 g/dL   Albumin 3.4 (L) 3.5 - 5.0 g/dL   AST 22 15 - 41 U/L   ALT 16 0 - 44 U/L   Alkaline Phosphatase 89 38 - 126 U/L   Total Bilirubin 1.4 (H) 0.3 - 1.2 mg/dL   GFR calc non Af Amer 59 (L) >60 mL/min   GFR calc Af Amer >60 >60 mL/min   Anion gap 10 5 - 15    Comment: Performed at [facility], [place] [address], [place], [place] [address]  Protime-INR     Status: None   Collection Time: 01/21/2018 12:34 PM  Result Value Ref Range   Prothrombin Time 12.0 11.4 - 15.2 seconds   INR  0.89     Comment: Performed at [facility], [place] [address] [place], [place] [address]  Brain natriuretic peptide     Status: None   Collection Time: 01/25/2018 12:34 PM  Result Value Ref Range   B Natriuretic Peptide 82.8 0.0 - 100.0 pg/mL    Comment: Performed at [facility] [place] [address] [place], [place] [address]  Troponin I - ONCE - STAT     Status: None   Collection Time: 01/16/2018 12:34 PM  Result Value Ref Range   Troponin I <0.03 <0.03 ng/mL    Comment: Performed at [facility], [place] [address], [place], [place] [address]  Type and screen Hold 2 units now and stay ahead 2 units at all times     Status: None   Collection Time: 01/29/2018  4:39 PM  Result Value Ref Range   ABO/RH(D) O POS    Antibody Screen NEG    Sample Expiration      01/20/2018 Performed at [facility], [place] [address] [place], [place] [address]   Hemoglobin and hematocrit, blood     Status: Abnormal   Collection Time: 01/13/2018  8:52 PM  Result Value Ref Range   Hemoglobin 11.2 (L) 13.0 - 17.0 g/dL   HCT 35.7 (L) 39.0 - 52.0 %    Comment: Performed at [facility], [place] [address], [place], [place] [address]  Basic metabolic panel     Status: Abnormal   Collection Time: 01/18/18  2:39 AM  Result Value Ref Range   Sodium 138 135 - 145 mmol/L   Potassium 4.2 3.5 - 5.1 mmol/L    Comment: DELTA CHECK NOTED NO VISIBLE HEMOLYSIS    Chloride 105 98 - 111 mmol/L   CO2 24 22 - 32 mmol/L   Glucose, Bld 208 (H) 70 - 99 mg/dL   BUN 28 (H) 8 - 23 mg/dL   Creatinine, Ser 1.30 (H) 0.61 - 1.24 mg/dL   Calcium 8.5 (L) 8.9 - 10.3 mg/dL   GFR calc non Af Amer 50 (L) >60 mL/min   GFR calc Af Amer 58 (L) >60 mL/min   Anion gap 9 5 - 15    Comment: Performed at [facility], [place] [address], [place],  [address]  Hemoglobin and hematocrit, blood     Status: Abnormal   Collection Time: 01/18/18  2:39  AM  Result Value Ref Range   Hemoglobin 10.5 (L) 13.0 - 17.0 g/dL   HCT 33.5 (L) 39.0 - 52.0 %    Comment: Performed at [facility], [place] [address], [place], [place] [address]  Glucose, capillary     Status: Abnormal  Collection Time: 01/18/18  7:33 AM  Result Value Ref Range   Glucose-Capillary 177 (H) 70 - 99 mg/dL  Hemoglobin and hematocrit, blood     Status: Abnormal   Collection Time: 01/18/18  8:55 AM  Result Value Ref Range   Hemoglobin 10.1 (L) 13.0 - 17.0 g/dL   HCT 31.9 (L) 39.0 - 52.0 %    Comment: Performed at [facility], [place] [address], [place], [place] [address]  Glucose, capillary     Status: Abnormal   Collection Time: 01/18/18 11:25 AM  Result Value Ref Range   Glucose-Capillary 225 (H) 70 - 99 mg/dL  Hemoglobin and hematocrit, blood     Status: Abnormal   Collection Time: 01/18/18  3:13 PM  Result Value Ref Range   Hemoglobin 10.1 (L) 13.0 - 17.0 g/dL   HCT 32.2 (L) 39.0 - 52.0 %    Comment: Performed at [facility], [place] [address] [place], [place] [address]  Glucose, capillary     Status: Abnormal   Collection Time: 01/18/18  4:33 PM  Result Value Ref Range   Glucose-Capillary 193 (H) 70 - 99 mg/dL  Glucose, capillary     Status: Abnormal   Collection Time: 01/18/18  8:58 PM  Result Value Ref Range   Glucose-Capillary 203 (H) 70 - 99 mg/dL  CBC     Status: Abnormal   Collection Time: 01/19/18  4:02 AM  Result Value Ref Range   WBC 15.1 (H) 4.0 - 10.5 K/uL   RBC 3.30 (L) 4.22 - 5.81 MIL/uL   Hemoglobin 9.6 (L) 13.0 - 17.0 g/dL   HCT 31.0 (L) 39.0 - 52.0 %   MCV 93.9 80.0 - 100.0 fL   MCH 29.1 26.0 - 34.0 pg   MCHC 31.0 30.0 - 36.0 g/dL   RDW 15.4 11.5 - 15.5 %   Platelets 196 150 - 400 K/uL   nRBC 0.0 0.0 - 0.2 %    Comment: Performed at [facility], [place] [address], [place], [place] [address]  Basic metabolic panel     Status: Abnormal   Collection Time: 01/19/18  4:02  AM  Result Value Ref Range   Sodium 139 135 - 145 mmol/L   Potassium 4.4 3.5 - 5.1 mmol/L   Chloride 105 98 - 111 mmol/L   CO2 23 22 - 32 mmol/L   Glucose, Bld 219 (H) 70 - 99 mg/dL   BUN 33 (H) 8 - 23 mg/dL   Creatinine, Ser 1.29 (H) 0.61 - 1.24 mg/dL   Calcium 8.7 (L) 8.9 - 10.3 mg/dL   GFR calc non Af Amer 51 (L) >60 mL/min   GFR calc Af Amer 59 (L) >60 mL/min   Anion gap 11 5 - 15    Comment: Performed at [facility], [place] [address], [place], [place] [address]  Glucose, capillary     Status: Abnormal   Collection Time: 01/19/18  7:53 AM  Result Value Ref Range   Glucose-Capillary 162 (H) 70 - 99 mg/dL   Dg Chest 2 View  Result Date: 01/03/2018 CLINICAL DATA:  Left lower chest and abdominal wall discomfort with bruising following a coughing paroxysm 3 days ago. History of COPD, former smoker. EXAM: CHEST - 2 VIEW COMPARISON:  PA and lateral chest x-ray of January 11, 2018 FINDINGS: The lungs are mildly hyperinflated. There is patchy density at the left lung base which is not new. The heart and pulmonary vascularity are normal. There  is calcification in the wall of the aortic arch. There is stable biapical pleural thickening. The bony thorax exhibits no acute abnormality. IMPRESSION: Chronic bronchitic changes, stable. No alveolar pneumonia. The visualized portions of the ribs are normal. Thoracic aortic atherosclerosis. Electronically Signed   By: David  Martinique M.D.   On: 01/25/2018 13:04   Ct Abdomen Pelvis W Contrast  Result Date: 01/10/2018 CLINICAL DATA:  coughing fit on Saturday and had bad pains on left side. Pt been using lidocaine patches for pain relief. Wife states she looked at it today and noticed bruising. Pt takes Plavix. Pt take Tussin for cough but per wife didn't take any yesterday. Pt reports pain is worse with coughing or moving. Finished antibiotics and prednisone that was given at previous visit here. EXAM: CT ABDOMEN AND PELVIS WITH CONTRAST  TECHNIQUE: Multidetector CT imaging of the abdomen and pelvis was performed using the standard protocol following bolus administration of intravenous contrast. CONTRAST:  130mL ISOVUE-300 IOPAMIDOL (ISOVUE-300) INJECTION 61% COMPARISON:  05/08/2016 FINDINGS: Lower chest: Bronchiectasis and scarring in the visualized left lower lobe as before. No pleural or pericardial effusion. Hepatobiliary: No focal liver abnormality is seen. No gallstones, gallbladder wall thickening, or biliary dilatation. Pancreas: Unremarkable. No pancreatic ductal dilatation or surrounding inflammatory changes. Spleen: Normal in size without focal abnormality. Adrenals/Urinary Tract: Normal adrenals. Benign appearing left renal cysts as before. No enhancing renal mass. No hydronephrosis. Urinary bladder physiologically distended, mildly thick-walled. Stomach/Bowel: Stomach is decompressed. Small bowel is nondilated. Normal appendix. The colon is nondilated, unremarkable. Vascular/Lymphatic: Calcified atheromatous plaque with extensive nonocclusive mural thrombus in the visualized descending thoracic, suprarenal and juxtarenal segments. Fusiform infrarenal aneurysm up to 3.5 cm transverse diameter (previously 3.4 cm 03/16/2016) with eccentric mildly occlusive mural thrombus, tapering to normal caliber above the bifurcation. Left renal artery ostial stent, patency indeterminate. Calcified ostial plaque involving the right renal artery resulting in high-grade stenosis or segmental occlusion. Heavily calcified ostial plaque and stenting through the length of the left common and external iliac arteries, patency indeterminate. Partially calcified plaque at the origin of the right common iliac artery resulting in stenosis of possible hemodynamic significance. Vascular stents through the right external iliac artery of indeterminate patency. Extensive partially calcified plaque through the common femoral arteries which are patent. No definite  abdominal or pelvic adenopathy Reproductive: Moderate prostatic enlargement with coarse calcifications. Other: No ascites. No free air. Musculoskeletal: Moderate left rectus sheath hematoma without evidence of active arterial extravasation. Stable benign bone island in the left iliac bone near the SI joint. No fracture or worrisome bone lesion. IMPRESSION: 1. Moderate left rectus sheath hematoma without active arterial extravasation. 2. 3.5 cm infrarenal abdominal aortic aneurysm, increased from 3.4 cm 03/16/2016. 3. Prostatic enlargement with thick-walled urinary bladder suggesting a degree of bladder outlet obstruction. 4. Extensive bilateral iliac arterial stents, of indeterminate patency. Correlate with clinical symptomatology and ABIs. Electronically Signed   By: Lucrezia Europe M.D.   On: 01/13/2018 15:11      Assessment/Plan Acute COPD exacerbation  Peripheral vascular disease AAA BPH  Rectus hematoma - CT 12/17: rectus hematoma without active extravasation - Hgb from 11 on 12/17 to 9.6 today; VSS - recommend ice, abdominal binder  - can resume plavix when hgb stable - I think drop in hgb today was likely just patient equilibrating. Would not transfuse unless Hgb gets <7 or patient becomes acutely symptomatic. No indications for surgical intervention. This will likely take several weeks for bruising and hematoma to completely resolve.  We will  sign off. Thank you for the consult.   Brigid Re, [facility] 01/19/2018, 9:26 AM Pager: [phone] Consults: [phone] Mon-Fri 7:00 am-4:30 pm Sat-Sun 7:00 am-11:30 am

## 2018-01-19 NOTE — Progress Notes (Signed)
PROGRESS NOTE    Douglas Monroe  KLK:[id number] DOB: 09-06-1932 DOA: 01/01/2018 PCP:    Brief Narrative: 84 year old with past medical history significant for COPD, not on home oxygen, peripheral vascular disease status post iliac stenting followed by Dr. Alvester Chou, hypertension, hyperlipidemia who was evaluated by his PCP 7 days a day ago for nonproductive cough and wheezing.  Patient was treated by his PCP with antibiotics and prednisone, he presented 2 days later to the emergency department with persistent cough wheezing worsening shortness of breath.  He was instructed to continue his prednisone and to finish antibiotics.  He presents to the ED with worsening shortness of breath.  He was found to have rectus sheath hematoma, with 2 g drop of hemoglobin since last seen in the ED  Assessment & Plan:   Principal Problem:   COPD with acute exacerbation ([place]) Active Problems:   PVD (peripheral vascular disease) with claudication, previous stents to bilateral iliacs. Rt. SFA also.    PVD S/P multiple PCIs   HTN (hypertension)   Abdominal aortic aneurysm (HCC)   S/P arterial stent, to Lt renal art. 11/02/12   Rectus sheath hematoma, initial encounter   Acute respiratory failure with hypoxia (HCC)   1-Acute COPD exacerbation, acute hypoxic respiratory failure; Patient continued to be short of breath, tachypneic, bilateral wheezing. Continue with IV Solu-Medrol every 6 hours, oral doxycycline, nebulizer treatment. Patient requiring 2 L of oxygen currently. Still with Shortness of breath/ not better. Still coughing.  Pulmonary consulted.   2-Rectus sheath hematoma with acute blood loss anemia; Presumed to be after a vigorous coughing spell Hemoglobin today has remained stable around 10. Hb drifting down. Surgery consulted.  Continue to hold Plavix  3-Peripheral vascular disease, with claudication previous stents to bilateral iliacs. Rt. SFA also.  Stent placement 2014. Follow with Dr.  Alvester Chou. Hold Plavix in the setting of acute bleed.  4-Abdominal aortic aneurysm ([place]) Noted for slight increase in size to 3.5 cm.  Follow as outpatient.  5-BPH, bladder outlet obstruction on CT. Resume Flomax and finasteride [name]. Bladder scan.  Hypokalemia replete.  Hyperglycemia: In the setting of IV steroids. Start sliding scale insulin.  Abdominal distension;  check [place] to evaluate for ascites  RN Pressure Injury Documentation:    Malnutrition Type:      Malnutrition Characteristics:      Nutrition Interventions:     Estimated body mass index is 26.87 kg/m as calculated from the following:   Height as of this encounter: 5\' 4"  (1.626 m).   Weight as of this encounter: 71 kg.   DVT prophylaxis: SCDs Code Status: Full code Family Communication: Wife who was at bedside. Disposition Plan: Remain in the hospital for treatment of COPD exacerbation, patient not at baseline for respiratory status. Consultants:  None  Procedures:   None   Antimicrobials: Doxycycline  Subjective: He is is complaining of worsening SOB, worse after nebulizer.  He has two kind of cough, one that is very bad and hurts in his stomach.  His last BM was 2 days ago.  His abdomen is always distended,    Objective: Vitals:   01/18/18 2028 01/18/18 2101 01/19/18 0434 01/19/18 0823  BP:  (!) 136/92 (!) 144/71   Pulse:  92 79   Resp:  18 18   Temp:  98 F (36.7 C) 97.8 F (36.6 C)   TempSrc:  Oral Oral   SpO2: 94% 95% 93% 94%  Weight:      Height:  Intake/Output Summary (Last 24 hours) at 01/19/2018 1042 Last data filed at 01/19/2018 0600 Gross per 24 hour  Intake 480 ml  Output 1200 ml  Net -720 ml   Filed Weights   01/18/18 1849  Weight: 71 kg    Examination:  General exam:  Tachypnea.  Respiratory system: Bilateral wheezing.  Cardiovascular system: S 1, S 2 Gastrointestinal system: BS present, distended, mild tender. Large bruise abdominal wall.    Central nervous system: alert.  Extremities: trace edema Skin: large bruise abdominal wall.    Data Reviewed: I have personally reviewed following labs and imaging studies  CBC: Recent Labs  Lab 01/27/2018 1234  01/18/18 0239 01/18/18 0855 01/18/18 1513 01/19/18 0402 01/19/18 0933  WBC 9.5  --   --   --   --  15.1*  --   NEUTROABS 7.1  --   --   --   --   --   --   HGB 11.1*   < > 10.5* 10.1* 10.1* 9.6* 9.9*  HCT 35.0*   < > 33.5* 31.9* 32.2* 31.0* 32.2*  MCV 90.4  --   --   --   --  93.9  --   PLT 187  --   --   --   --  196  --    < > = values in this interval not displayed.   Basic Metabolic Panel: Recent Labs  Lab 01/20/2018 1234 01/18/18 0239 01/19/18 0402  NA 139 138 139  K 3.2* 4.2 4.4  CL 104 105 105  CO2 25 24 23   GLUCOSE 113* 208* 219*  BUN 23 28* 33*  CREATININE 1.14 1.30* 1.29*  CALCIUM 8.6* 8.5* 8.7*   GFR: Estimated Creatinine Clearance: 36.3 mL/min (A) (by C-G formula based on SCr of 1.29 mg/dL (H)). Liver Function Tests: Recent Labs  Lab 01/25/2018 1234  AST 22  ALT 16  ALKPHOS 89  BILITOT 1.4*  PROT 6.6  ALBUMIN 3.4*   No results for input(s): LIPASE, AMYLASE in the last 168 hours. No results for input(s): AMMONIA in the last 168 hours. Coagulation Profile: Recent Labs  Lab 01/02/2018 1234  INR 0.89   Cardiac Enzymes: Recent Labs  Lab 01/18/2018 1234  TROPONINI <0.03   BNP (last 3 results) No results for input(s): PROBNP in the last 8760 hours. HbA1C: No results for input(s): HGBA1C in the last 72 hours. CBG: Recent Labs  Lab 01/18/18 0733 01/18/18 1125 01/18/18 1633 01/18/18 2058 01/19/18 0753  GLUCAP 177* 225* 193* 203* 162*   Lipid Profile: No results for input(s): CHOL, HDL, LDLCALC, TRIG, CHOLHDL, LDLDIRECT in the last 72 hours. Thyroid Function Tests: No results for input(s): TSH, T4TOTAL, FREET4, T3FREE, THYROIDAB in the last 72 hours. Anemia Panel: No results for input(s): VITAMINB12, FOLATE, FERRITIN, TIBC, IRON,  RETICCTPCT in the last 72 hours. Sepsis Labs: No results for input(s): PROCALCITON, LATICACIDVEN in the last 168 hours.  No results found for this or any previous visit (from the past 240 hour(s)).       Radiology Studies: Dg Chest 2 View  Result Date: 01/18/2018 CLINICAL DATA:  Left lower chest and abdominal wall discomfort with bruising following a coughing paroxysm 3 days ago. History of COPD, former smoker. EXAM: CHEST - 2 VIEW COMPARISON:  PA and lateral chest x-ray of January 11, 2018 FINDINGS: The lungs are mildly hyperinflated. There is patchy density at the left lung base which is not new. The heart and pulmonary vascularity are normal. There is calcification  in the wall of the aortic arch. There is stable biapical pleural thickening. The bony thorax exhibits no acute abnormality. IMPRESSION: Chronic bronchitic changes, stable. No alveolar pneumonia. The visualized portions of the ribs are normal. Thoracic aortic atherosclerosis. Electronically Signed   By: David  Martinique M.D.   On: 01/20/2018 13:04   Ct Abdomen Pelvis W Contrast  Result Date: 01/23/2018 CLINICAL DATA:  coughing fit on Saturday and had bad pains on left side. Pt been using lidocaine patches for pain relief. Wife states she looked at it today and noticed bruising. Pt takes Plavix. Pt take Tussin for cough but per wife didn't take any yesterday. Pt reports pain is worse with coughing or moving. Finished antibiotics and prednisone that was given at previous visit here. EXAM: CT ABDOMEN AND PELVIS WITH CONTRAST TECHNIQUE: Multidetector CT imaging of the abdomen and pelvis was performed using the standard protocol following bolus administration of intravenous contrast. CONTRAST:  171mL ISOVUE-300 IOPAMIDOL (ISOVUE-300) INJECTION 61% COMPARISON:  05/08/2016 FINDINGS: Lower chest: Bronchiectasis and scarring in the visualized left lower lobe as before. No pleural or pericardial effusion. Hepatobiliary: No focal liver abnormality  is seen. No gallstones, gallbladder wall thickening, or biliary dilatation. Pancreas: Unremarkable. No pancreatic ductal dilatation or surrounding inflammatory changes. Spleen: Normal in size without focal abnormality. Adrenals/Urinary Tract: Normal adrenals. Benign appearing left renal cysts as before. No enhancing renal mass. No hydronephrosis. Urinary bladder physiologically distended, mildly thick-walled. Stomach/Bowel: Stomach is decompressed. Small bowel is nondilated. Normal appendix. The colon is nondilated, unremarkable. Vascular/Lymphatic: Calcified atheromatous plaque with extensive nonocclusive mural thrombus in the visualized descending thoracic, suprarenal and juxtarenal segments. Fusiform infrarenal aneurysm up to 3.5 cm transverse diameter (previously 3.4 cm 03/16/2016) with eccentric mildly occlusive mural thrombus, tapering to normal caliber above the bifurcation. Left renal artery ostial stent, patency indeterminate. Calcified ostial plaque involving the right renal artery resulting in high-grade stenosis or segmental occlusion. Heavily calcified ostial plaque and stenting through the length of the left common and external iliac arteries, patency indeterminate. Partially calcified plaque at the origin of the right common iliac artery resulting in stenosis of possible hemodynamic significance. Vascular stents through the right external iliac artery of indeterminate patency. Extensive partially calcified plaque through the common femoral arteries which are patent. No definite abdominal or pelvic adenopathy Reproductive: Moderate prostatic enlargement with coarse calcifications. Other: No ascites. No free air. Musculoskeletal: Moderate left rectus sheath hematoma without evidence of active arterial extravasation. Stable benign bone island in the left iliac bone near the SI joint. No fracture or worrisome bone lesion. IMPRESSION: 1. Moderate left rectus sheath hematoma without active arterial  extravasation. 2. 3.5 cm infrarenal abdominal aortic aneurysm, increased from 3.4 cm 03/16/2016. 3. Prostatic enlargement with thick-walled urinary bladder suggesting a degree of bladder outlet obstruction. 4. Extensive bilateral iliac arterial stents, of indeterminate patency. Correlate with clinical symptomatology and ABIs. Electronically Signed   By: Lucrezia Europe M.D.   On: 01/20/2018 15:11   Dg Chest Port 1 View  Result Date: 01/19/2018 CLINICAL DATA:  Increased short of breath EXAM: PORTABLE CHEST 1 VIEW COMPARISON:  01/27/2018 FINDINGS: The heart is upper normal in size. Normal vascularity. Aortic atherosclerotic calcification. Subsegmental atelectasis at the lung bases. Otherwise clear lungs. IMPRESSION: Bibasilar atelectasis. Electronically Signed   By: Marybelle Killings M.D.   On: 01/19/2018 10:01        Scheduled Meds: . amLODipine  5 mg Oral Daily  . atorvastatin  20 mg Oral Daily  . budesonide (PULMICORT) nebulizer solution  0.25 mg Nebulization BID  . chlorpheniramine-HYDROcodone  5 mL Oral Q12H  . cholecalciferol  2,000 Units Oral Daily  . doxycycline  100 mg Oral Q12H  . famotidine  20 mg Oral Daily  . finasteride  5 mg Oral Daily  . guaiFENesin  600 mg Oral BID  . insulin aspart  0-9 Units Subcutaneous TID WC  . ipratropium-albuterol  3 mL Nebulization QID  . loratadine  10 mg Oral Daily  . magnesium oxide  400 mg Oral Daily  . methylPREDNISolone (SOLU-MEDROL) injection  60 mg Intravenous Q8H  . nebivolol  10 mg Oral Daily  . pantoprazole  40 mg Oral Daily  . polyethylene glycol  17 g Oral BID  . tamsulosin  0.4 mg Oral Daily  . umeclidinium-vilanterol  1 puff Inhalation Daily   Continuous Infusions:   LOS: 2 days    Time spent: 35 minutes     Elmarie Shiley, MD [facility] Pager [phone]  If 7PM-7AM, please contact night-coverage [email or web address] Password [email or web address] 01/19/2018, 10:42 AM

## 2018-01-19 NOTE — Progress Notes (Signed)
Call by nurse patient with SOB, abdomen more distended.  Came to see patient. Patient report SOB, worse after nebulizer. Tachypnea.  Per daughter abdomen looks more distended.  Also patient with belching, and feeling reflux like symptoms.   Plan;  1-CCM informed of worsening dyspnea. DR Ander Slade will see patient again.  -check KUB. Start IV protonix.  -surgery informed of patient condition. If patient vomited will need NG tube.  -Vitals stable.  Douglas Monroe.

## 2018-01-20 LAB — HEMOGLOBIN AND HEMATOCRIT, BLOOD
HCT: 36.3 % — ABNORMAL LOW (ref 39.0–52.0)
HCT: 34.3 % — ABNORMAL LOW (ref 39.0–52.0)
HEMATOCRIT: 34.6 % — AB (ref 39.0–52.0)
Hemoglobin: 11.3 g/dL — ABNORMAL LOW (ref 13.0–17.0)
Hemoglobin: 10.6 g/dL — ABNORMAL LOW (ref 13.0–17.0)
Hemoglobin: 10.6 g/dL — ABNORMAL LOW (ref 13.0–17.0)

## 2018-01-20 LAB — CBC
HCT: 33 % — ABNORMAL LOW (ref 39.0–52.0)
Hemoglobin: 10.3 g/dL — ABNORMAL LOW (ref 13.0–17.0)
MCH: 29.3 pg (ref 26.0–34.0)
MCHC: 31.2 g/dL (ref 30.0–36.0)
MCV: 94 fL (ref 80.0–100.0)
Platelets: 222 10*3/uL (ref 150–400)
RBC: 3.51 MIL/uL — ABNORMAL LOW (ref 4.22–5.81)
RDW: 15.7 % — AB (ref 11.5–15.5)
WBC: 21 10*3/uL — ABNORMAL HIGH (ref 4.0–10.5)
nRBC: 0 % (ref 0.0–0.2)

## 2018-01-20 LAB — BASIC METABOLIC PANEL
Anion gap: 10 (ref 5–15)
BUN: 35 mg/dL — ABNORMAL HIGH (ref 8–23)
CALCIUM: 8.8 mg/dL — AB (ref 8.9–10.3)
CO2: 24 mmol/L (ref 22–32)
Chloride: 105 mmol/L (ref 98–111)
Creatinine, Ser: 1.07 mg/dL (ref 0.61–1.24)
Glucose, Bld: 128 mg/dL — ABNORMAL HIGH (ref 70–99)
Potassium: 4.4 mmol/L (ref 3.5–5.1)
Sodium: 139 mmol/L (ref 135–145)

## 2018-01-20 LAB — GLUCOSE, CAPILLARY
GLUCOSE-CAPILLARY: 172 mg/dL — AB (ref 70–99)
Glucose-Capillary: 150 mg/dL — ABNORMAL HIGH (ref 70–99)
Glucose-Capillary: 163 mg/dL — ABNORMAL HIGH (ref 70–99)
Glucose-Capillary: 164 mg/dL — ABNORMAL HIGH (ref 70–99)

## 2018-01-20 LAB — MRSA PCR SCREENING: MRSA by PCR: NEGATIVE

## 2018-01-20 MED ORDER — GUAIFENESIN-CODEINE 100-10 MG/5ML PO SOLN
5.0000 mL | Freq: Four times a day (QID) | ORAL | Status: DC
  Administered 2018-01-20 – 2018-01-24 (×18): 5 mL via ORAL
  Filled 2018-01-20 (×16): qty 5

## 2018-01-20 MED ORDER — SODIUM CHLORIDE 0.9 % IV SOLN
INTRAVENOUS | Status: DC
  Administered 2018-01-20 – 2018-01-21 (×2): via INTRAVENOUS

## 2018-01-20 MED ORDER — AZITHROMYCIN 250 MG PO TABS
500.0000 mg | ORAL_TABLET | Freq: Every day | ORAL | Status: DC
  Administered 2018-01-20 – 2018-01-28 (×9): 500 mg via ORAL
  Filled 2018-01-20 (×9): qty 2

## 2018-01-20 MED ORDER — CEFDINIR 300 MG PO CAPS
300.0000 mg | ORAL_CAPSULE | Freq: Two times a day (BID) | ORAL | Status: DC
  Administered 2018-01-20 – 2018-01-22 (×5): 300 mg via ORAL
  Filled 2018-01-20 (×6): qty 1

## 2018-01-20 MED ORDER — LORAZEPAM 2 MG/ML IJ SOLN
0.5000 mg | Freq: Once | INTRAMUSCULAR | Status: DC
  Filled 2018-01-20: qty 1

## 2018-01-20 MED ORDER — ALPRAZOLAM 0.25 MG PO TABS
0.2500 mg | ORAL_TABLET | Freq: Once | ORAL | Status: DC
  Filled 2018-01-20: qty 1

## 2018-01-20 MED ORDER — PANTOPRAZOLE SODIUM 40 MG PO TBEC
40.0000 mg | DELAYED_RELEASE_TABLET | Freq: Two times a day (BID) | ORAL | Status: DC
  Administered 2018-01-20 – 2018-02-01 (×26): 40 mg via ORAL
  Filled 2018-01-20 (×27): qty 1

## 2018-01-20 NOTE — Progress Notes (Signed)
NAME:  Douglas Monroe, MRN:  [id number], DOB:  [birth year], LOS: 3 ADMISSION DATE:  01/08/2018, CONSULTATION DATE: 01/19/2018 REFERRING MD: Tyrell Antonio, CHIEF COMPLAINT: COPD exacerbation, abdominal wall hematoma  Brief History   Patient was admitted to the hospital with complaints of protracted coughing and bruising on his left lower abdomen Patient of [name]- followed for chronic respiratory failure and severe chronic obstructive pulmonary disease   History of present illness   Was recently treated for upper respiratory infection he did follow-up with his primary doctor and then ended up in the ED a couple of days later He had persistent cough and wheezing which improved on amoxicillin, prednisone, antitussives The coughing and wheezing did recur honey was coughing vigorously-subsequently noted the abdominal wall hematoma secondary to having severe pain and discomfort Came into the hospital for further evaluation  Past Medical History  Advanced chronic obstructive pulmonary disease Chronic exertional dyspnea Peripheral vascular disease Hypertension Hyperlipidemia History of kidney stones First-degree heart block History of anxiety History of tubular adenoma of the colon  Significant Hospital Events   Relatively stable, slight improvement in symptoms Abdomen feels less tense Has an abdominal binder on  Consults:  PCCM on 01/19/2018 Surgical input regarding his abdominal wall hematoma  Procedures:   Significant Diagnostic Tests:  Chest x-ray shows no acute infiltrate KUB shows no significant pathology  Micro Data:    Antimicrobials:  On doxycycline  Interim history/subjective:  Shortness of breath, wheezing Intermittent symptoms Cough is about the same  Objective   Blood pressure (!) 132/56, pulse 75, temperature 97.9 F (36.6 C), temperature source Oral, resp. rate 20, height 5\' 4"  (1.626 m), weight 71 kg, SpO2 97 %.        Intake/Output Summary (Last  24 hours) at 01/20/2018 9381 Last data filed at 01/20/2018 0600 Gross per 24 hour  Intake -  Output 700 ml  Net -700 ml   Filed Weights   01/18/18 1849  Weight: 71 kg    Examination: General: Elderly gentleman does not appear to be in acute distress HENT: Moist oral mucosa Lungs: Bilateral wheezes Cardiovascular: S1-S2 appreciated Abdomen: Abdomen is soft, extensive bruising noted Extremities: No edema, no clubbing Neuro: He is alert, oriented x3, moving all extremities   Resolved Hospital Problem list     Assessment & Plan:  Severe COPD with exacerbation -Severe obstructive defect on his most recent PFT -Exacerbation likely related to a viral infection -Continue bronchodilators -Continue steroids-on IV steroids and Pulmicort nebulizations -Complete course of antibiotics  Abdominal wall hematoma secondary to protracted coughing -Optimize anti-tussives -Continue Tessalon Perles  -Optimize pain management  Hypertension -Continue antidepressant antihypertensives  Constipation -Resolved Best practice:  Diet: Cardiac diet Pain/Anxiety/Delirium protocol (if indicated): Pain management per primary DVT prophylaxis: SCDs Mobility: As tolerated Code Status: Full code Disposition:   Labs   CBC: Recent Labs  Lab 01/11/2018 1234  01/18/18 1513 01/19/18 0402 01/19/18 0933 01/19/18 1533 01/20/18 0103  WBC 9.5  --   --  15.1*  --   --  21.0*  NEUTROABS 7.1  --   --   --   --   --   --   HGB 11.1*   < > 10.1* 9.6* 9.9* 10.4* 10.3*  HCT 35.0*   < > 32.2* 31.0* 32.2* 33.5* 33.0*  MCV 90.4  --   --  93.9  --   --  94.0  PLT 187  --   --  196  --   --  222   < > =  values in this interval not displayed.    Basic Metabolic Panel: Recent Labs  Lab 01/19/2018 1234 01/18/18 0239 01/19/18 0402 01/20/18 0103  NA 139 138 139 139  K 3.2* 4.2 4.4 4.4  CL 104 105 105 105  CO2 25 24 23 24   GLUCOSE 113* 208* 219* 128*  BUN 23 28* 33* 35*  CREATININE 1.14 1.30* 1.29* 1.07    CALCIUM 8.6* 8.5* 8.7* 8.8*   GFR: Estimated Creatinine Clearance: 43.8 mL/min (by C-G formula based on SCr of 1.07 mg/dL). Recent Labs  Lab 01/16/2018 1234 01/19/18 0402 01/20/18 0103  WBC 9.5 15.1* 21.0*    Liver Function Tests: Recent Labs  Lab 01/24/2018 1234  AST 22  ALT 16  ALKPHOS 89  BILITOT 1.4*  PROT 6.6  ALBUMIN 3.4*   No results for input(s): LIPASE, AMYLASE in the last 168 hours. No results for input(s): AMMONIA in the last 168 hours.  ABG    Component Value Date/Time   HCO3 28.1 (H) 10/30/2006 2355   TCO2 25 08/20/2016 0351     Coagulation Profile: Recent Labs  Lab 01/22/2018 1234  INR 0.89    Cardiac Enzymes: Recent Labs  Lab 01/30/2018 1234  TROPONINI <0.03    HbA1C: Hgb A1c MFr Bld  Date/Time Value Ref Range Status  11/08/2017 12:04 PM 6.7 (H) 4.8 - 5.6 % Final    Comment:             Prediabetes: 5.7 - 6.4          Diabetes: >6.4          Glycemic control for adults with diabetes: <7.0   05/02/2017 6.4 (A) 4.0 - 6.0 Final    CBG: Recent Labs  Lab 01/18/18 2058 01/19/18 0753 01/19/18 1159 01/19/18 1711 01/19/18 2231  GLUCAP 203* 162* 227* 197* 160*    Review of Systems:   Review of Systems  Constitutional: Negative.   HENT: Negative.   Eyes: Negative.   Respiratory: Positive for cough, shortness of breath and wheezing.   Cardiovascular: Negative.   Skin: Negative.      Past Medical History  He,  has a past medical history of Abdominal aortic aneurysm ([place]), Allergy to alpha-gal, Anxiety, Arthritis, Cancer of skin of temple (2017), COPD with emphysema ([place]), Dyspnea, Exertional dyspnea, First degree heart block, GERD (gastroesophageal reflux disease), Headache(784.0), History of kidney stones, HOH (hard of hearing), HTN (hypertension) (08/20/2011), Hyperlipidemia, Hypertension, Ischemic colitis ([place]), Left carotid artery stenosis, Migraine, Occlusion of right vertebral artery without cerebral infarction, PVD (peripheral  vascular disease) with claudication ([place]) (cardiologist-  dr berry), Right ureteral stone, S/P arterial stent, Sleep apnea, Tubular adenoma of colon, and Urticaria.   Surgical History    Past Surgical History:  Procedure Laterality Date  . ABDOMINAL ANGIOGRAM  08/19/2011   Left common iliac artery at the ostium, 6x51mm ICAST covered stent, common femoral artery, 8x130mm Abbott absolute pro nitinol self-expanding stent and resulting in reduction of 80 and 90% stenosis to 0% residual  . ABDOMINAL ANGIOGRAM  10/08/2010   Mid-right SFA, 6x120 EV3 Protege nitinol self-expanding stent, resulting in reduction of CTO to 0% residual  . ABDOMINAL ANGIOGRAM  06/22/2010   Rt Common Femoral Artery-8x3 Absolute Pro Nitinol self-expanding stent-90% stenosis to 0% residual; Rt External Iliac Artery-9x3 Absolute Pro Nitinol self-expanding stent-70% stenosis to 0% residual; Left External Iliac Artery-8x4 Absolute Pro Nitinol self-expanding stent-90% to 0% residual  . ATHERECTOMY N/A 08/19/2011   Procedure: ATHERECTOMY;  Surgeon: Lorretta Harp, MD;  Location: [facility];  Service: Cardiovascular;  Laterality: N/A;  . BACK SURGERY    . CARDIAC CATHETERIZATION  10/26/2002  dr Tami Ribas   normal coronary arteries/ normal  LVF  . CARDIOVASCULAR STRESS TEST  06-12-2010   dr berry   normal perfusion study/ no ischemia/  ef 69%  . COLONOSCOPY WITH PROPOFOL N/A 03/19/2016   Procedure: COLONOSCOPY WITH PROPOFOL;  Surgeon: Mauri Pole, MD;  Location: [facility];  Service: Endoscopy;  Laterality: N/A;  was inpatient, but got d/c'd home and is coming back as an OP  . CYSTOSCOPY W/ URETERAL STENT PLACEMENT Left 06/07/2016   Procedure: CYSTOSCOPY WITH RETROGRADE PYELOGRAM/URETERAL STENT REPLACEMENT;  Surgeon: Cleon Gustin, MD;  Location: [facility];  Service: Urology;  Laterality: Left;  . CYSTOSCOPY W/ URETERAL STENT PLACEMENT Left 09/20/2016   Procedure: CYSTOSCOPY WITH RETROGRADE PYELOGRAM/URETERAL STENT PLACEMENT;   Surgeon: Cleon Gustin, MD;  Location: [facility];  Service: Urology;  Laterality: Left;  . CYSTOSCOPY WITH RETROGRADE PYELOGRAM, URETEROSCOPY AND STENT PLACEMENT Right 10/10/2013   Procedure: uretheral dilation, CYSTOSCOPY, URETEROSCOPY, stone extraction, STENT PLACEMENT;  Surgeon: [name], MD;  Location: [facility];  Service: Urology;  Laterality: Right;  . CYSTOSCOPY WITH RETROGRADE PYELOGRAM, URETEROSCOPY AND STENT PLACEMENT Left 02/08/2017   Procedure: CYSTOSCOPY WITH RETROGRADE PYELOGRAM, URETEROSCOPY;  Surgeon: Cleon Gustin, MD;  Location: [facility];  Service: Urology;  Laterality: Left;  . CYSTOSCOPY WITH RETROGRADE PYELOGRAM, URETEROSCOPY AND STENT PLACEMENT Left 05/16/2017   Procedure: CYSTOSCOPY WITH RETROGRADE PYELOGRAM, URETEROSCOPY WITH ABLATION OF URETERAL TUMOR, BLADDER BIOPSY AND FULGURATION, AND STENT PLACEMENT;  Surgeon: Cleon Gustin, MD;  Location: [facility];  Service: Urology;  Laterality: Left;  . CYSTOSCOPY WITH RETROGRADE PYELOGRAM, URETEROSCOPY AND STENT PLACEMENT Left 06/13/2017   Procedure: CYSTOSCOPY WITH RETROGRADE PYELOGRAM, URETEROSCOPY STENT EXCHANGE;  Surgeon: Cleon Gustin, MD;  Location: [facility];  Service: Urology;  Laterality: Left;  . CYSTOSCOPY WITH RETROGRADE PYELOGRAM, URETEROSCOPY AND STENT PLACEMENT Left 10/31/2017   Procedure: CYSTOSCOPY WITH RETROGRADE PYELOGRAM, URETEROSCOPY ,FULGURATION OF URETERAL TUMOR AND STENT PLACEMENT;  Surgeon: Cleon Gustin, MD;  Location: [facility];  Service: Urology;  Laterality: Left;  1 HR POSSIBLE STENT PLACEMENT, ALSO POSSIBLE TUMOR ABLATION  . CYSTOSCOPY WITH URETEROSCOPY Left 05/09/2016   Procedure: CYSTOSCOPY, RETROGRADE, LEFT URETEROSCOPY, LEFT URETERAL BIOPSIES, HOLMIUM LASER FULGERATION, LEFT URETERAL STENT PLACEMENT;  Surgeon: Irine Seal, MD;  Location: [facility];  Service: Urology;  Laterality: Left;  . CYSTOSCOPY/URETEROSCOPY/HOLMIUM LASER Left 09/20/2016   Procedure:  CYSTOSCOPY/URETEROSCOPY/HOLMIUM LASER;  Surgeon: Cleon Gustin, MD;  Location: [facility];  Service: Urology;  Laterality: Left;  . HOLMIUM LASER APPLICATION Right 06/03/9765   Procedure: HOLMIUM LASER APPLICATION;  Surgeon: [name], MD;  Location: [facility];  Service: Urology;  Laterality: Right;  . HOLMIUM LASER APPLICATION Left 04/05/1935   Procedure: TUMOR ABLATION WITH HOLMIUM LASER;  Surgeon: Cleon Gustin, MD;  Location: [facility];  Service: Urology;  Laterality: Left;  . LUMBAR DISC SURGERY  1970's  . RENAL ANGIOGRAM N/A 11/02/2012   Procedure: RENAL ANGIOGRAM;  Surgeon: Lorretta Harp, MD;  Location: [facility] CATH [facility];  Service: Cardiovascular;  Laterality: N/A;  . RENAL ARTERY STENT Left 11/02/2012  . SKIN CANCER EXCISION Right 2017   temple  . SOFT TISSUE CYST EXCISION  1960's   "outside part of lung LLL"  . TRANSURETHRAL RESECTION OF BLADDER TUMOR N/A 06/13/2017   Procedure: TRANSURETHRAL RESECTION OF BLADDER TUMOR (TURBT);  Surgeon: Nicolette Bang  L, MD;  Location: [facility];  Service: Urology;  Laterality: N/A;     Social History   reports that he quit smoking about 7 years ago. His smoking use included cigarettes. He has a 60.00 pack-year smoking history. He has never used smokeless tobacco. He reports that he does not drink alcohol or use drugs.   Family History   His family history includes Cancer in his father; Diabetes in his brother; Hypertension in his mother and son.   Allergies Allergies  Allergen Reactions  . Meat Extract Other (See Comments)    Alpha gal, red meat allergy   . Pneumococcal Vaccines Swelling and Other (See Comments)    Lip swelling

## 2018-01-20 NOTE — Progress Notes (Signed)
Discontinued mucinex and made guaifenesin with codeine qid around the clock

## 2018-01-20 NOTE — Plan of Care (Signed)
Pt becomes very SOB w/ activity even when wearing O2. Pt did have a BM this shift.

## 2018-01-20 NOTE — Progress Notes (Addendum)
PROGRESS NOTE    Douglas Monroe  HUD:[id number] DOB: February 08, 1932 DOA: 01/05/2018 PCP:    Brief Narrative: 84 year old with past medical history significant for COPD, not on home oxygen, peripheral vascular disease status post iliac stenting followed by Dr. Alvester Chou, hypertension, hyperlipidemia who was evaluated by his PCP 7 days a day ago for nonproductive cough and wheezing.  Patient was treated by his PCP with antibiotics and prednisone, he presented 2 days later to the emergency department with persistent cough wheezing worsening shortness of breath.  He was instructed to continue his prednisone and to finish antibiotics.  He presents to the ED with worsening shortness of breath.  He was found to have rectus sheath hematoma, with 2 g drop of hemoglobin since last seen in the ED  Assessment & Plan:   Principal Problem:   COPD with acute exacerbation ([place]) Active Problems:   PVD (peripheral vascular disease) with claudication, previous stents to bilateral iliacs. Rt. SFA also.    PVD S/P multiple PCIs   HTN (hypertension)   Abdominal aortic aneurysm (HCC)   S/P arterial stent, to Lt renal art. 11/02/12   Rectus sheath hematoma, initial encounter   Acute respiratory failure with hypoxia (HCC)   1-Acute COPD exacerbation, acute hypoxic respiratory failure; Patient continued to be short of breath, tachypneic, bilateral wheezing. Continue with IV Solu-Medrol every 6 hours, oral cefdinir, azithromycin,  nebulizer treatment. Patient requiring 2 L of oxygen currently. Respiratory failure related to COPD exacerbation and restriction component form abdominal wall hematoma.  Continue with nebulizer.  Robitussin schedule guaifenesin with codeine.   2-Rectus sheath hematoma with acute blood loss anemia; Presumed to be after a vigorous coughing spell Hemoglobin today has remained stable around 10. Hb drifting down. Surgery consulted and following. Recommending abdominal binding.  Continue to  hold Plavix Patient develops worsening abdominal pain and worsening localized distension.  Transfer to step down unit for closer monitoring.  Monitor hb, blood transfusion as needed.  Surgery follow up on patient today, recommending abdominal binding.  -monitor for hypotension.  -monitor BP, SBP 100. Hold Norvasc, IV fluids. Monitor BP closely in the step down unit.   3-Peripheral vascular disease, with claudication previous stents to bilateral iliacs. Rt. SFA also.  Stent placement 2014. Follow with Dr. Alvester Chou. Hold Plavix in the setting of acute bleed. CT reviewed by Vascular Dr Saintclair Halsted, patient to follow up with Dr Gwenlyn Found .   4-Abdominal aortic aneurysm ([place]) Noted for slight increase in size to 3.5 cm.  Follow as outpatient.  5-BPH, bladder outlet obstruction on CT. Resume Flomax and finasteride.  Bladder scan.  Hypokalemia replete.  Hyperglycemia: In the setting of IV steroids. Continue with sliding scale insulin.  Abdominal distension;  [place] negative for ascites.  KUB negative for obstruction.  Likely related to abdominal wall hematoma.  Continue with abdominal binder.   Leukocytosis;  Could be related to steroids.  Continue with antibiotics, cefdinir and azithro,/   CKD stage II; monitor renal function.   RN Pressure Injury Documentation:    Malnutrition Type:      Malnutrition Characteristics:      Nutrition Interventions:     Estimated body mass index is 26.87 kg/m as calculated from the following:   Height as of this encounter: 5\' 4"  (1.626 m).   Weight as of this encounter: 71 kg.   DVT prophylaxis: SCDs Code Status: Full code Family Communication: multiples family member at bedside.  Disposition Plan: transfer to step down unit to monitor Hb, respiratory status  and BP.   Consultants:  None  Procedures:   None   Antimicrobials: Doxycycline  Subjective: Patient report SOB with minimal exertion.  He was just urinating this am when he  got really SOB. He is still coughing. Early this am his abdomen appears less distended.   Later in the morning around 10 am, he develops worsening abdominal pain, and was notice to have worsening distension in one part of his abdomen.    Objective: Vitals:   01/19/18 1954 01/19/18 1956 01/19/18 2234 01/20/18 0443  BP:   (!) 141/73 (!) 132/56  Pulse:   69 75  Resp:   17 20  Temp:   97.6 F (36.4 C) 97.9 F (36.6 C)  TempSrc:   Oral Oral  SpO2: 96% 96% 98% 91%  Weight:      Height:        Intake/Output Summary (Last 24 hours) at 01/20/2018 1540 Last data filed at 01/20/2018 0600 Gross per 24 hour  Intake -  Output 700 ml  Net -700 ml   Filed Weights   01/18/18 1849  Weight: 71 kg    Examination:  General exam:  Mild tachypnea.  Respiratory system: Blateral decreases breath sounds, wheezing right side.  Cardiovascular system: S 1, S 2 RRR Gastrointestinal system: BS present, abdomen is distended, Softer today, large area of bruise.  Central nervous system: Alert, follows command.  Extremities: Trace edema Skin: Large abdominal wall hematoma,    Data Reviewed: I have personally reviewed following labs and imaging studies  CBC: Recent Labs  Lab 01/09/2018 1234  01/18/18 1513 01/19/18 0402 01/19/18 0933 01/19/18 1533 01/20/18 0103  WBC 9.5  --   --  15.1*  --   --  21.0*  NEUTROABS 7.1  --   --   --   --   --   --   HGB 11.1*   < > 10.1* 9.6* 9.9* 10.4* 10.3*  HCT 35.0*   < > 32.2* 31.0* 32.2* 33.5* 33.0*  MCV 90.4  --   --  93.9  --   --  94.0  PLT 187  --   --  196  --   --  222   < > = values in this interval not displayed.   Basic Metabolic Panel: Recent Labs  Lab 01/30/2018 1234 01/18/18 0239 01/19/18 0402 01/20/18 0103  NA 139 138 139 139  K 3.2* 4.2 4.4 4.4  CL 104 105 105 105  CO2 25 24 23 24   GLUCOSE 113* 208* 219* 128*  BUN 23 28* 33* 35*  CREATININE 1.14 1.30* 1.29* 1.07  CALCIUM 8.6* 8.5* 8.7* 8.8*   GFR: Estimated Creatinine Clearance:  43.8 mL/min (by C-G formula based on SCr of 1.07 mg/dL). Liver Function Tests: Recent Labs  Lab 01/03/2018 1234  AST 22  ALT 16  ALKPHOS 89  BILITOT 1.4*  PROT 6.6  ALBUMIN 3.4*   No results for input(s): LIPASE, AMYLASE in the last 168 hours. No results for input(s): AMMONIA in the last 168 hours. Coagulation Profile: Recent Labs  Lab 01/16/2018 1234  INR 0.89   Cardiac Enzymes: Recent Labs  Lab 01/12/2018 1234  TROPONINI <0.03   BNP (last 3 results) No results for input(s): PROBNP in the last 8760 hours. HbA1C: No results for input(s): HGBA1C in the last 72 hours. CBG: Recent Labs  Lab 01/18/18 2058 01/19/18 0753 01/19/18 1159 01/19/18 1711 01/19/18 2231  GLUCAP 203* 162* 227* 197* 160*   Lipid Profile: No results  for input(s): CHOL, HDL, LDLCALC, TRIG, CHOLHDL, LDLDIRECT in the last 72 hours. Thyroid Function Tests: No results for input(s): TSH, T4TOTAL, FREET4, T3FREE, THYROIDAB in the last 72 hours. Anemia Panel: No results for input(s): VITAMINB12, FOLATE, FERRITIN, TIBC, IRON, RETICCTPCT in the last 72 hours. Sepsis Labs: No results for input(s): PROCALCITON, LATICACIDVEN in the last 168 hours.  Recent Results (from the past 240 hour(s))  Respiratory Panel by PCR     Status: None   Collection Time: 01/19/18  9:21 AM  Result Value Ref Range Status   Adenovirus NOT DETECTED NOT DETECTED Final   Coronavirus 229E NOT DETECTED NOT DETECTED Final   Coronavirus HKU1 NOT DETECTED NOT DETECTED Final   Coronavirus NL63 NOT DETECTED NOT DETECTED Final   Coronavirus OC43 NOT DETECTED NOT DETECTED Final   Metapneumovirus NOT DETECTED NOT DETECTED Final   Rhinovirus / Enterovirus NOT DETECTED NOT DETECTED Final   Influenza A NOT DETECTED NOT DETECTED Final   Influenza B NOT DETECTED NOT DETECTED Final   Parainfluenza Virus 1 NOT DETECTED NOT DETECTED Final   Parainfluenza Virus 2 NOT DETECTED NOT DETECTED Final   Parainfluenza Virus 3 NOT DETECTED NOT DETECTED Final    Parainfluenza Virus 4 NOT DETECTED NOT DETECTED Final   Respiratory Syncytial Virus NOT DETECTED NOT DETECTED Final   Bordetella pertussis NOT DETECTED NOT DETECTED Final   Chlamydophila pneumoniae NOT DETECTED NOT DETECTED Final   Mycoplasma pneumoniae NOT DETECTED NOT DETECTED Final    Comment: Performed at [facility], [place]. [address] [place],  [address]         Radiology Studies: Dg Abd 1 View  Result Date: 01/19/2018 CLINICAL DATA:  84 y/o  M; abdominal distention. EXAM: ABDOMEN - 1 VIEW COMPARISON:  None. FINDINGS: Normal bowel gas pattern. Bilateral iliac stents noted. Bones are unremarkable. No radiopaque urinary stone disease is evident. IMPRESSION: Normal bowel gas pattern. Electronically Signed   By: Kristine Garbe M.D.   On: 01/19/2018 15:22   US Abdomen Limited  Result Date: 01/19/2018 CLINICAL DATA:  Abdominal distension. EXAM: LIMITED ABDOMEN ULTRASOUND FOR ASCITES TECHNIQUE: Limited ultrasound survey for ascites was performed in all four abdominal quadrants. COMPARISON:  None. FINDINGS: No ascites is noted. IMPRESSION: No ascites is noted. Electronically Signed   By: Marijo Conception, M.D.   On: 01/19/2018 11:17   Dg Chest Port 1 View  Result Date: 01/19/2018 CLINICAL DATA:  Increased short of breath EXAM: PORTABLE CHEST 1 VIEW COMPARISON:  01/29/2018 FINDINGS: The heart is upper normal in size. Normal vascularity. Aortic atherosclerotic calcification. Subsegmental atelectasis at the lung bases. Otherwise clear lungs. IMPRESSION: Bibasilar atelectasis. Electronically Signed   By: Marybelle Killings M.D.   On: 01/19/2018 10:01        Scheduled Meds: . amLODipine  5 mg Oral Daily  . atorvastatin  20 mg Oral Daily  . azithromycin  500 mg Oral Daily  . benzonatate  200 mg Oral TID  . budesonide (PULMICORT) nebulizer solution  0.25 mg Nebulization BID  . chlorpheniramine-HYDROcodone  5 mL Oral Q12H  . cholecalciferol  2,000 Units Oral Daily  .  docusate sodium  100 mg Oral BID  . doxycycline  100 mg Oral Q12H  . famotidine  20 mg Oral Daily  . finasteride  5 mg Oral Daily  . guaiFENesin  600 mg Oral BID  . insulin aspart  0-9 Units Subcutaneous TID WC  . ipratropium-albuterol  3 mL Nebulization QID  . loratadine  10 mg  Oral Daily  . LORazepam  0.5 mg Intravenous Once  . magnesium oxide  400 mg Oral Daily  . methylPREDNISolone (SOLU-MEDROL) injection  60 mg Intravenous Q8H  . nebivolol  10 mg Oral Daily  . pantoprazole (PROTONIX) IV  40 mg Intravenous Q12H  . polyethylene glycol  17 g Oral BID  . senna  1 tablet Oral Daily  . simethicone  80 mg Oral QID  . tamsulosin  0.4 mg Oral Daily  . umeclidinium-vilanterol  1 puff Inhalation Daily   Continuous Infusions:   LOS: 3 days    Time spent: 35 minutes     Elmarie Shiley, MD [facility] Pager [phone]  If 7PM-7AM, please contact night-coverage [email or web address] Password [email or web address] 01/20/2018, 8:22 AM

## 2018-01-20 NOTE — Progress Notes (Signed)
Pt transferred to [place], with RR nurse, report called to [place]. Pt given pain med, and compression ABD binder reapplied to ABD, pain states pain is 10/10.Marland Kitchensharp and aching. SRP, RN

## 2018-01-20 NOTE — Progress Notes (Signed)
C/O of increase pain in abd, localized raised area, taut and firm, painful to touch, MD on the unit and made aware. SRP, RN

## 2018-01-20 NOTE — Care Management Important Message (Signed)
Important Message  Patient Details  Name: Douglas Monroe MRN: [id number] Date of Birth: 08-04-1932   Medicare Important Message Given:  Yes    Kerin Salen 01/20/2018, 10:42 AMImportant Message  Patient Details  Name: Douglas Monroe MRN: [id number] Date of Birth: 04-02-1932   Medicare Important Message Given:  Yes    Kerin Salen 01/20/2018, 10:42 AM

## 2018-01-21 LAB — GLUCOSE, CAPILLARY
Glucose-Capillary: 163 mg/dL — ABNORMAL HIGH (ref 70–99)
Glucose-Capillary: 136 mg/dL — ABNORMAL HIGH (ref 70–99)
Glucose-Capillary: 191 mg/dL — ABNORMAL HIGH (ref 70–99)
Glucose-Capillary: 149 mg/dL — ABNORMAL HIGH (ref 70–99)

## 2018-01-21 LAB — BASIC METABOLIC PANEL
Anion gap: 8 (ref 5–15)
BUN: 36 mg/dL — ABNORMAL HIGH (ref 8–23)
CO2: 25 mmol/L (ref 22–32)
Calcium: 8.4 mg/dL — ABNORMAL LOW (ref 8.9–10.3)
Chloride: 105 mmol/L (ref 98–111)
Creatinine, Ser: 1.15 mg/dL (ref 0.61–1.24)
GFR calc Af Amer: >60 mL/min (ref >60)
GFR calc non Af Amer: 59 mL/min — ABNORMAL LOW (ref >60)
GLUCOSE: 159 mg/dL — AB (ref 70–99)
Potassium: 4.5 mmol/L (ref 3.5–5.1)
Sodium: 138 mmol/L (ref 135–145)

## 2018-01-21 LAB — CBC
HEMATOCRIT: 30.4 % — AB (ref 39.0–52.0)
HEMOGLOBIN: 9.4 g/dL — AB (ref 13.0–17.0)
MCH: 29.2 pg (ref 26.0–34.0)
MCHC: 30.9 g/dL (ref 30.0–36.0)
MCV: 94.4 fL (ref 80.0–100.0)
Platelets: 190 10*3/uL (ref 150–400)
RBC: 3.22 MIL/uL — ABNORMAL LOW (ref 4.22–5.81)
RDW: 15.9 % — ABNORMAL HIGH (ref 11.5–15.5)
WBC: 16.4 10*3/uL — ABNORMAL HIGH (ref 4.0–10.5)
nRBC: 0 % (ref 0.0–0.2)

## 2018-01-21 LAB — MAGNESIUM: Magnesium: 2.2 mg/dL (ref 1.7–2.4)

## 2018-01-21 LAB — HEMOGLOBIN AND HEMATOCRIT, BLOOD
HCT: 33.9 % — ABNORMAL LOW (ref 39.0–52.0)
Hemoglobin: 10.4 g/dL — ABNORMAL LOW (ref 13.0–17.0)

## 2018-01-21 MED ORDER — ALPRAZOLAM 0.25 MG PO TABS
0.1250 mg | ORAL_TABLET | Freq: Two times a day (BID) | ORAL | Status: DC | PRN
  Administered 2018-01-21: 0.125 mg via ORAL
  Filled 2018-01-21: qty 1

## 2018-01-21 MED ORDER — ALPRAZOLAM 0.25 MG PO TABS
0.2500 mg | ORAL_TABLET | Freq: Two times a day (BID) | ORAL | Status: DC | PRN
  Administered 2018-01-21: 0.25 mg via ORAL
  Filled 2018-01-21: qty 1

## 2018-01-21 MED ORDER — AMLODIPINE BESYLATE 5 MG PO TABS
5.0000 mg | ORAL_TABLET | Freq: Every day | ORAL | Status: DC
  Administered 2018-01-21 – 2018-01-25 (×5): 5 mg via ORAL
  Filled 2018-01-21 (×5): qty 1

## 2018-01-21 MED ORDER — MAGNESIUM SULFATE 2 GM/50ML IV SOLN
2.0000 g | Freq: Once | INTRAVENOUS | Status: AC
  Administered 2018-01-21: 2 g via INTRAVENOUS
  Filled 2018-01-21: qty 50

## 2018-01-21 MED ORDER — ALBUTEROL SULFATE (2.5 MG/3ML) 0.083% IN NEBU
2.5000 mg | INHALATION_SOLUTION | Freq: Four times a day (QID) | RESPIRATORY_TRACT | Status: DC
  Administered 2018-01-21 (×4): 2.5 mg via RESPIRATORY_TRACT
  Filled 2018-01-21 (×5): qty 3

## 2018-01-21 NOTE — Progress Notes (Addendum)
PROGRESS NOTE    Douglas Monroe  TKP:[id number] DOB: 1933-01-04 DOA: 01/27/2018 PCP:    Brief Narrative: 84 year old with past medical history significant for COPD, not on home oxygen, peripheral vascular disease status post iliac stenting followed by Dr. Alvester Chou, hypertension, hyperlipidemia who was evaluated by his PCP 7 days a day ago for nonproductive cough and wheezing.  Patient was treated by his PCP with antibiotics and prednisone, he presented 2 days later to the emergency department with persistent cough wheezing worsening shortness of breath.  He was instructed to continue his prednisone and to finish antibiotics.  He presents to the ED with worsening shortness of breath.  He was found to have rectus sheath hematoma, with 2 g drop of hemoglobin since last seen in the ED  Assessment & Plan:   Principal Problem:   COPD with acute exacerbation ([place]) Active Problems:   PVD (peripheral vascular disease) with claudication, previous stents to bilateral iliacs. Rt. SFA also.    PVD S/P multiple PCIs   HTN (hypertension)   Abdominal aortic aneurysm (HCC)   S/P arterial stent, to Lt renal art. 11/02/12   Rectus sheath hematoma, initial encounter   Acute respiratory failure with hypoxia (H[facility])   1-Acute COPD exacerbation, acute hypoxic respiratory failure; Patient continued to be short of breath, tachypneic, bilateral wheezing. Continue with IV Solu-Medrol every 6 hours, oral cefdinir, azithromycin,  nebulizer treatment. Patient requiring 2 L of oxygen currently. Respiratory failure related to COPD exacerbation and restriction component form abdominal wall hematoma.  Continue with nebulizer.  Robitussin schedule guaifenesin with codeine.  Will give one time dose of magnesium.  Still with SOB on minimal exertion and cough.  Will add xanax PRN for anxiety.   2-Rectus sheath hematoma with acute blood loss anemia; Presumed to be after a vigorous coughing spell Hemoglobin today has  remained stable around 10. Hb drifting down. Surgery consulted and following. Recommending abdominal binding.  Continue to hold Plavix Patient develops worsening abdominal pain and worsening localized distension.  Transfer to step down unit for closer monitoring.  Monitor hb, blood transfusion as needed.  Surgery follow up on patient 12-20, recommending abdominal binding.  slightly decrease in hb. Follow trend. Transfusion as needed.   3-Peripheral vascular disease, with claudication previous stents to bilateral iliacs. Rt. SFA also.  Stent placement 2014. Follow with Dr. Alvester Chou. Hold Plavix in the setting of acute bleed. CT reviewed by Vascular Dr Saintclair Halsted, patient to follow up with Dr Gwenlyn Found .   4-Abdominal aortic aneurysm ([place]) Noted for slight increase in size to 3.5 cm.  Follow as outpatient.  5-BPH, bladder outlet obstruction on CT. Resume Flomax and finasteride.  Bladder scan.  Hypokalemia resolved.  HTN; no further low BP. Resume Norvasc.   Hyperglycemia: In the setting of IV steroids. Continue with sliding scale insulin.  Abdominal distension;  [place] negative for ascites.  KUB negative for obstruction.  Likely related to abdominal wall hematoma.  Continue with abdominal binder.   Leukocytosis;  Could be related to steroids.  Continue with antibiotics, cefdinir and azithro,/   CKD stage II; monitor renal function.   RN Pressure Injury Documentation:    Malnutrition Type:      Malnutrition Characteristics:      Nutrition Interventions:     Estimated body mass index is 26.87 kg/m as calculated from the following:   Height as of this encounter: 5\' 4"  (1.626 m).   Weight as of this encounter: 71 kg.   DVT prophylaxis: SCDs Code Status:  Full code Family Communication: multiples family member at bedside.  Disposition Plan: transfer to step down unit to monitor Hb, respiratory status and BP.   Consultants:  None  Procedures:    None   Antimicrobials: Doxycycline  Subjective: Patient report SOB on minimal exertion. Cough persist.  Denies abdominal pain.    Objective: Vitals:   01/21/18 0404 01/21/18 0500 01/21/18 0600 01/21/18 0700  BP: (!) 150/51 (!) 128/50  (!) 150/128  Pulse: 70 65 75 77  Resp: (!) 24 (!) 22 (!) 25 (!) 29  Temp:    (!) 97.5 F (36.4 C)  TempSrc:    Oral  SpO2: 96% 92% 92% 93%  Weight:      Height:        Intake/Output Summary (Last 24 hours) at 01/21/2018 0842 Last data filed at 01/21/2018 0400 Gross per 24 hour  Intake 986.52 ml  Output 1075 ml  Net -88.48 ml   Filed Weights   01/18/18 1849  Weight: 71 kg    Examination:  General exam:  Tachypnea Respiratory system: Bilateral wheezing.  Cardiovascular system; S 1, S 2 RRR Gastrointestinal system: BS present, distended, abdominal binder in place.  Central nervous system: alert , follow command.  Extremities: no edema Skin: Large abdominal wall hematoma,    Data Reviewed: I have personally reviewed following labs and imaging studies  CBC: Recent Labs  Lab 01/30/2018 1234  01/19/18 0402  01/20/18 0103 01/20/18 1047 01/20/18 1655 01/20/18 2310 01/21/18 0322  WBC 9.5  --  15.1*  --  21.0*  --   --   --  16.4*  NEUTROABS 7.1  --   --   --   --   --   --   --   --   HGB 11.1*   < > 9.6*   < > 10.3* 11.3* 10.6* 10.6* 9.4*  HCT 35.0*   < > 31.0*   < > 33.0* 36.3* 34.3* 34.6* 30.4*  MCV 90.4  --  93.9  --  94.0  --   --   --  94.4  PLT 187  --  196  --  222  --   --   --  190   < > = values in this interval not displayed.   Basic Metabolic Panel: Recent Labs  Lab 01/01/2018 1234 01/18/18 0239 01/19/18 0402 01/20/18 0103 01/21/18 0322  NA 139 138 139 139 138  K 3.2* 4.2 4.4 4.4 4.5  CL 104 105 105 105 105  CO2 25 24 23 24 25   GLUCOSE 113* 208* 219* 128* 159*  BUN 23 28* 33* 35* 36*  CREATININE 1.14 1.30* 1.29* 1.07 1.15  CALCIUM 8.6* 8.5* 8.7* 8.8* 8.4*   GFR: Estimated Creatinine Clearance: 40.8  mL/min (by C-G formula based on SCr of 1.15 mg/dL). Liver Function Tests: Recent Labs  Lab 01/15/2018 1234  AST 22  ALT 16  ALKPHOS 89  BILITOT 1.4*  PROT 6.6  ALBUMIN 3.4*   No results for input(s): LIPASE, AMYLASE in the last 168 hours. No results for input(s): AMMONIA in the last 168 hours. Coagulation Profile: Recent Labs  Lab 01/12/2018 1234  INR 0.89   Cardiac Enzymes: Recent Labs  Lab 01/02/2018 1234  TROPONINI <0.03   BNP (last 3 results) No results for input(s): PROBNP in the last 8760 hours. HbA1C: No results for input(s): HGBA1C in the last 72 hours. CBG: Recent Labs  Lab 01/20/18 0736 01/20/18 1228 01/20/18 1708 01/20/18 2149 01/21/18 0488  GLUCAP 172* 150* 163* 164* 163*   Lipid Profile: No results for input(s): CHOL, HDL, LDLCALC, TRIG, CHOLHDL, LDLDIRECT in the last 72 hours. Thyroid Function Tests: No results for input(s): TSH, T4TOTAL, FREET4, T3FREE, THYROIDAB in the last 72 hours. Anemia Panel: No results for input(s): VITAMINB12, FOLATE, FERRITIN, TIBC, IRON, RETICCTPCT in the last 72 hours. Sepsis Labs: No results for input(s): PROCALCITON, LATICACIDVEN in the last 168 hours.  Recent Results (from the past 240 hour(s))  Respiratory Panel by PCR     Status: None   Collection Time: 01/19/18  9:21 AM  Result Value Ref Range Status   Adenovirus NOT DETECTED NOT DETECTED Final   Coronavirus 229E NOT DETECTED NOT DETECTED Final   Coronavirus HKU1 NOT DETECTED NOT DETECTED Final   Coronavirus NL63 NOT DETECTED NOT DETECTED Final   Coronavirus OC43 NOT DETECTED NOT DETECTED Final   Metapneumovirus NOT DETECTED NOT DETECTED Final   Rhinovirus / Enterovirus NOT DETECTED NOT DETECTED Final   Influenza A NOT DETECTED NOT DETECTED Final   Influenza B NOT DETECTED NOT DETECTED Final   Parainfluenza Virus 1 NOT DETECTED NOT DETECTED Final   Parainfluenza Virus 2 NOT DETECTED NOT DETECTED Final   Parainfluenza Virus 3 NOT DETECTED NOT DETECTED Final    Parainfluenza Virus 4 NOT DETECTED NOT DETECTED Final   Respiratory Syncytial Virus NOT DETECTED NOT DETECTED Final   Bordetella pertussis NOT DETECTED NOT DETECTED Final   Chlamydophila pneumoniae NOT DETECTED NOT DETECTED Final   Mycoplasma pneumoniae NOT DETECTED NOT DETECTED Final    Comment: Performed at [facility], [place]. [address], [place], [place] [address]  MRSA PCR Screening     Status: None   Collection Time: 01/20/18 11:21 AM  Result Value Ref Range Status   MRSA by PCR NEGATIVE NEGATIVE Final    Comment:        The GeneXpert MRSA Assay (FDA approved for NASAL specimens only), is one component of a comprehensive MRSA colonization surveillance program. It is not intended to diagnose MRSA infection nor to guide or monitor treatment for MRSA infections. Performed at [facility], [place] [address], [place], [place] [address]          Radiology Studies: Dg Abd 1 View  Result Date: 01/19/2018 CLINICAL DATA:  84 y/o  M; abdominal distention. EXAM: ABDOMEN - 1 VIEW COMPARISON:  None. FINDINGS: Normal bowel gas pattern. Bilateral iliac stents noted. Bones are unremarkable. No radiopaque urinary stone disease is evident. IMPRESSION: Normal bowel gas pattern. Electronically Signed   By: Kristine Garbe M.D.   On: 01/19/2018 15:22   US Abdomen Limited  Result Date: 01/19/2018 CLINICAL DATA:  Abdominal distension. EXAM: LIMITED ABDOMEN ULTRASOUND FOR ASCITES TECHNIQUE: Limited ultrasound survey for ascites was performed in all four abdominal quadrants. COMPARISON:  None. FINDINGS: No ascites is noted. IMPRESSION: No ascites is noted. Electronically Signed   By: Marijo Conception, M.D.   On: 01/19/2018 11:17   Dg Chest Port 1 View  Result Date: 01/19/2018 CLINICAL DATA:  Increased short of breath EXAM: PORTABLE CHEST 1 VIEW COMPARISON:  01/04/2018 FINDINGS: The heart is upper normal in size. Normal vascularity. Aortic atherosclerotic  calcification. Subsegmental atelectasis at the lung bases. Otherwise clear lungs. IMPRESSION: Bibasilar atelectasis. Electronically Signed   By: Marybelle Killings M.D.   On: 01/19/2018 10:01        Scheduled Meds: . albuterol  2.5 mg Nebulization QID  . ALPRAZolam  0.25 mg Oral Once  . amLODipine  5 mg Oral Daily  . atorvastatin  20 mg Oral Daily  . azithromycin  500 mg Oral Daily  . benzonatate  200 mg Oral TID  . budesonide (PULMICORT) nebulizer solution  0.25 mg Nebulization BID  . cefdinir  300 mg Oral Q12H  . chlorpheniramine-HYDROcodone  5 mL Oral Q12H  . cholecalciferol  2,000 Units Oral Daily  . docusate sodium  100 mg Oral BID  . famotidine  20 mg Oral Daily  . finasteride  5 mg Oral Daily  . guaiFENesin-codeine  5 mL Oral QID  . insulin aspart  0-9 Units Subcutaneous TID WC  . loratadine  10 mg Oral Daily  . LORazepam  0.5 mg Intravenous Once  . magnesium oxide  400 mg Oral Daily  . methylPREDNISolone (SOLU-MEDROL) injection  60 mg Intravenous Q8H  . nebivolol  10 mg Oral Daily  . pantoprazole  40 mg Oral BID AC  . polyethylene glycol  17 g Oral BID  . senna  1 tablet Oral Daily  . simethicone  80 mg Oral QID  . tamsulosin  0.4 mg Oral Daily  . umeclidinium-vilanterol  1 puff Inhalation Daily   Continuous Infusions: . magnesium sulfate 1 - 4 g bolus IVPB       LOS: 4 days    Time spent: 35 minutes     Elmarie Shiley, MD [facility] Pager [phone]  If 7PM-7AM, please contact night-coverage [email or web address] Password [facility] 01/21/2018, 8:42 AM

## 2018-01-21 NOTE — Progress Notes (Signed)
NAME:  Douglas Monroe, MRN:  [id number], DOB:  Jul 21, 1932, LOS: 4 ADMISSION DATE:  01/20/2018, CONSULTATION DATE: 01/19/2018 REFERRING MD: Tyrell Antonio, CHIEF COMPLAINT: COPD exacerbation, abdominal wall hematoma  Brief History   Patient was admitted to the hospital with complaints of protracted coughing and bruising on his left lower abdomen Patient of [name]- followed for chronic respiratory failure and severe chronic obstructive pulmonary disease   History of present illness   Was recently treated for upper respiratory infection he did follow-up with his primary doctor and then ended up in the ED a couple of days later He had persistent cough and wheezing which improved on amoxicillin, prednisone, antitussives The coughing and wheezing did recur honey was coughing vigorously-subsequently noted the abdominal wall hematoma secondary to having severe pain and discomfort Came into the hospital for further evaluation 12/21-feeling a little bit better today, still coughing as much, less abdominal pain and discomfort  Past Medical History  Advanced chronic obstructive pulmonary disease Chronic exertional dyspnea Peripheral vascular disease Hypertension Hyperlipidemia History of kidney stones First-degree heart block History of anxiety History of tubular adenoma of the colon  Significant Hospital Events   Relatively stable, slight improvement in symptoms Abdomen feels less tense Has an abdominal binder on  Consults:  PCCM on 01/19/2018 Surgical input regarding his abdominal wall hematoma  Procedures:   Significant Diagnostic Tests:  Chest x-ray shows no acute infiltrate KUB shows no significant pathology  Micro Data:    Antimicrobials:  On doxycycline Omnicef 12/20>> Azithromycin 12/20>>  Interim history/subjective:  Shortness of breath, wheezing Intermittent symptoms Cough is about the same Abdominal discomfort is a little bit better  Objective   Blood  pressure (!) 147/62, pulse 77, temperature (!) 97.5 F (36.4 C), temperature source Oral, resp. rate (!) 29, height 5\' 4"  (1.626 m), weight 71 kg, SpO2 93 %.        Intake/Output Summary (Last 24 hours) at 01/21/2018 1115 Last data filed at 01/21/2018 0400 Gross per 24 hour  Intake 986.52 ml  Output 1075 ml  Net -88.48 ml   Filed Weights   01/18/18 1849  Weight: 71 kg    Examination: General: Does not appear to be in acute distress HENT: Moist oral mucosa Lungs: Wheezing is better today, still present Cardiovascular: S1-S2 appreciated Abdomen: Abdomen is soft, nontender, bruising noted Extremities: No edema, no clubbing Neuro: Alert and oriented x3, moving all extremities   Resolved Hospital Problem list    Assessment & Plan:  Severe COPD with exacerbation -Severe obstructive defect on his most recent PFT -Exacerbation likely related to a viral infection -Continue bronchodilators -Continue steroids-on IV steroids and Pulmicort nebulizations -Complete course of antibiotics-currently on azithromycin and Rocephin  Abdominal wall hematoma secondary to protracted coughing -Optimize anti-tussives -Continue Tessalon Perles  -Optimize pain management -Continue with binder in place  Hypertension -Continue antihypertensives  Constipation -Resolved  Encouraged to mobilize a little bit more  Best practice:  Diet: Cardiac diet Pain/Anxiety/Delirium protocol (if indicated): Pain management per primary DVT prophylaxis: SCDs Mobility: As tolerated Code Status: Full code Disposition:   Labs   CBC: Recent Labs  Lab 01/14/2018 1234  01/19/18 0402  01/20/18 0103 01/20/18 1047 01/20/18 1655 01/20/18 2310 01/21/18 0322  WBC 9.5  --  15.1*  --  21.0*  --   --   --  16.4*  NEUTROABS 7.1  --   --   --   --   --   --   --   --  HGB 11.1*   < > 9.6*   < > 10.3* 11.3* 10.6* 10.6* 9.4*  HCT 35.0*   < > 31.0*   < > 33.0* 36.3* 34.3* 34.6* 30.4*  MCV 90.4  --  93.9  --  94.0   --   --   --  94.4  PLT 187  --  196  --  222  --   --   --  190   < > = values in this interval not displayed.    Basic Metabolic Panel: Recent Labs  Lab 01/21/2018 1234 01/18/18 0239 01/19/18 0402 01/20/18 0103 01/21/18 0322  NA 139 138 139 139 138  K 3.2* 4.2 4.4 4.4 4.5  CL 104 105 105 105 105  CO2 25 24 23 24 25   GLUCOSE 113* 208* 219* 128* 159*  BUN 23 28* 33* 35* 36*  CREATININE 1.14 1.30* 1.29* 1.07 1.15  CALCIUM 8.6* 8.5* 8.7* 8.8* 8.4*  MG  --   --   --   --  2.2   GFR: Estimated Creatinine Clearance: 40.8 mL/min (by C-G formula based on SCr of 1.15 mg/dL). Recent Labs  Lab 01/02/2018 1234 01/19/18 0402 01/20/18 0103 01/21/18 0322  WBC 9.5 15.1* 21.0* 16.4*    Liver Function Tests: Recent Labs  Lab 01/04/2018 1234  AST 22  ALT 16  ALKPHOS 89  BILITOT 1.4*  PROT 6.6  ALBUMIN 3.4*   No results for input(s): LIPASE, AMYLASE in the last 168 hours. No results for input(s): AMMONIA in the last 168 hours.  ABG    Component Value Date/Time   HCO3 28.1 (H) 10/30/2006 2355   TCO2 25 08/20/2016 0351     Coagulation Profile: Recent Labs  Lab 01/20/2018 1234  INR 0.89    Cardiac Enzymes: Recent Labs  Lab 01/07/2018 1234  TROPONINI <0.03    HbA1C: Hgb A1c MFr Bld  Date/Time Value Ref Range Status  11/08/2017 12:04 PM 6.7 (H) 4.8 - 5.6 % Final    Comment:             Prediabetes: 5.7 - 6.4          Diabetes: >6.4          Glycemic control for adults with diabetes: <7.0   05/02/2017 6.4 (A) 4.0 - 6.0 Final    CBG: Recent Labs  Lab 01/20/18 0736 01/20/18 1228 01/20/18 1708 01/20/18 2149 01/21/18 0755  GLUCAP 172* 150* 163* 164* 163*    Review of Systems:   Review of Systems  Constitutional: Negative.  Negative for fever.  HENT: Negative.   Eyes: Negative.   Respiratory: Positive for cough and shortness of breath.   Cardiovascular: Negative.  Negative for chest pain and orthopnea.  Gastrointestinal: Negative.  Negative for  constipation.  Genitourinary: Negative.   Skin: Negative.    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   Surgical History   Noted in records  Social History   reports that he  quit smoking about 7 years ago. His smoking use included cigarettes. He has a 60.00 pack-year smoking history. He has never used smokeless tobacco. He reports that he does not drink alcohol or use drugs.   Family History   His family history includes Cancer in his father; Diabetes in his brother; Hypertension in his mother and son.   Allergies Allergies  Allergen Reactions  . Meat Extract Other (See Comments)    Alpha gal, red meat allergy   . Pneumococcal Vaccines Swelling and Other (See Comments)    Lip swelling

## 2018-01-22 ENCOUNTER — Inpatient Hospital Stay (HOSPITAL_COMMUNITY): Payer: Medicare Other

## 2018-01-22 DIAGNOSIS — R000 Tachycardia, unspecified: Secondary | ICD-10-CM

## 2018-01-22 LAB — HEMOGLOBIN AND HEMATOCRIT, BLOOD
HCT: 32.6 % — ABNORMAL LOW (ref 39.0–52.0)
HEMATOCRIT: 33.5 % — AB (ref 39.0–52.0)
Hemoglobin: 10.1 g/dL — ABNORMAL LOW (ref 13.0–17.0)
Hemoglobin: 10.3 g/dL — ABNORMAL LOW (ref 13.0–17.0)

## 2018-01-22 LAB — GLUCOSE, CAPILLARY: Glucose-Capillary: 160 mg/dL — ABNORMAL HIGH (ref 70–99)

## 2018-01-22 MED ORDER — ALPRAZOLAM 0.25 MG PO TABS
0.2500 mg | ORAL_TABLET | Freq: Once | ORAL | Status: AC
  Administered 2018-01-22: 0.25 mg via ORAL
  Filled 2018-01-22: qty 1

## 2018-01-22 MED ORDER — LORAZEPAM 2 MG/ML IJ SOLN
0.5000 mg | Freq: Once | INTRAMUSCULAR | Status: AC
  Administered 2018-01-22: 0.5 mg via INTRAVENOUS
  Filled 2018-01-22: qty 1

## 2018-01-22 MED ORDER — ORAL CARE MOUTH RINSE
15.0000 mL | Freq: Two times a day (BID) | OROMUCOSAL | Status: DC
  Administered 2018-01-22 – 2018-01-31 (×13): 15 mL via OROMUCOSAL

## 2018-01-22 MED ORDER — IPRATROPIUM BROMIDE 0.02 % IN SOLN: 0.5000 mg | RESPIRATORY_TRACT | Status: DC

## 2018-01-22 MED ORDER — ALPRAZOLAM 0.25 MG PO TABS
0.1250 mg | ORAL_TABLET | Freq: Three times a day (TID) | ORAL | Status: DC | PRN
  Administered 2018-01-23 – 2018-01-31 (×15): 0.125 mg via ORAL
  Filled 2018-01-22 (×18): qty 1

## 2018-01-22 MED ORDER — ALBUTEROL SULFATE (2.5 MG/3ML) 0.083% IN NEBU
2.5000 mg | INHALATION_SOLUTION | Freq: Once | RESPIRATORY_TRACT | Status: AC
  Administered 2018-01-22: 2.5 mg via RESPIRATORY_TRACT

## 2018-01-22 MED ORDER — SODIUM CHLORIDE 0.9 % IV SOLN
1.0000 g | INTRAVENOUS | Status: DC
  Administered 2018-01-22 – 2018-01-28 (×7): 1 g via INTRAVENOUS
  Filled 2018-01-22: qty 1
  Filled 2018-01-22: qty 10
  Filled 2018-01-22 (×3): qty 1
  Filled 2018-01-22 (×2): qty 10
  Filled 2018-01-22 (×2): qty 1

## 2018-01-22 MED ORDER — IPRATROPIUM-ALBUTEROL 0.5-2.5 (3) MG/3ML IN SOLN
3.0000 mL | Freq: Four times a day (QID) | RESPIRATORY_TRACT | Status: DC
  Administered 2018-01-22 – 2018-02-02 (×42): 3 mL via RESPIRATORY_TRACT
  Filled 2018-01-22 (×44): qty 3

## 2018-01-22 MED ORDER — HYDRALAZINE HCL 20 MG/ML IJ SOLN
10.0000 mg | Freq: Four times a day (QID) | INTRAMUSCULAR | Status: DC | PRN
  Administered 2018-01-22: 10 mg via INTRAVENOUS
  Filled 2018-01-22 (×2): qty 1

## 2018-01-22 MED ORDER — IPRATROPIUM-ALBUTEROL 0.5-2.5 (3) MG/3ML IN SOLN
RESPIRATORY_TRACT | Status: AC
  Filled 2018-01-22: qty 3

## 2018-01-22 MED ORDER — METHYLPREDNISOLONE SODIUM SUCC 40 MG IJ SOLR
40.0000 mg | Freq: Once | INTRAMUSCULAR | Status: AC
  Administered 2018-01-22: 40 mg via INTRAVENOUS
  Filled 2018-01-22: qty 1

## 2018-01-22 NOTE — Progress Notes (Signed)
Pt transferred to [facility] at 0530. O2 sats in the high 80s, [place] flow increased to 4L. After transferring back to bed, pt had a difficult time returning to his baseline. Pt was forgetting to breath through nose, so NRB mask was applied (simple mask unavailable. Respiratory therapy called, albuterol neb given x2. Pt's O2 sat had returned to baseline of mid 90s, but was anxious and tachypnic in the mid 52s. 0.5 mg ativan given IV, pt responded well. Currently alert and calm in bed.  Oncoming RN aware.

## 2018-01-22 NOTE — Progress Notes (Signed)
PROGRESS NOTE    Douglas Monroe  WPV:[id number] DOB: 1932-02-11 DOA: 01/18/2018 PCP:    Brief Narrative: 84 year old with past medical history significant for COPD, not on home oxygen, peripheral vascular disease status post iliac stenting followed by Dr. Alvester Chou, hypertension, hyperlipidemia who was evaluated by his PCP 7 days a day ago for nonproductive cough and wheezing.  Patient was treated by his PCP with antibiotics and prednisone, he presented 2 days later to the emergency department with persistent cough wheezing worsening shortness of breath.  He was instructed to continue his prednisone and to finish antibiotics.  He presents to the ED with worsening shortness of breath.  He was found to have rectus sheath hematoma, with 2 g drop of hemoglobin since last seen in the ED  Assessment & Plan:   Principal Problem:   COPD with acute exacerbation ([place]) Active Problems:   PVD (peripheral vascular disease) with claudication, previous stents to bilateral iliacs. Rt. SFA also.    PVD S/P multiple PCIs   HTN (hypertension)   Abdominal aortic aneurysm (HCC)   S/P arterial stent, to Lt renal art. 11/02/12   Rectus sheath hematoma, initial encounter   Acute respiratory failure with hypoxia (HCC)   1-Acute COPD exacerbation, acute hypoxic respiratory failure; Continue with IV Solu-Medrol every 6 hours,  azithromycin, IV ceftriaxone.  nebulizer treatment. Respiratory failure related to COPD exacerbation and restriction component form abdominal wall hematoma.  Robitussin schedule guaifenesin with codeine.  Episode this am, notice. Patient became more hypoxic and SOB when getting to commode. He was also anxious. He was place on NB mask. Subsequently 4 L oxygen/. Chest x ray reviewed with Dr Ander Slade, consistent with more atelectasis. Patient already on antibiotics.  -will stop anoro. Resume ipratropium, continue with albuterol. Stop Pulmicort patient report feeling worse after Pulmicort.  -I will  change xanax to TID PRN, careful with oversedation.  -Patient FEV1 0.76 at baseline  2-Rectus sheath hematoma with acute blood loss anemia; Presumed to be after a vigorous coughing spell Hemoglobin today has remained stable around 10. Hb drifting down. Surgery consulted and following. Recommending abdominal binding.  Continue to hold Plavix Patient develops worsening abdominal pain and worsening localized distension.  Transfer to step down unit for closer monitoring.  Monitor hb, blood transfusion as needed.  Surgery follow up on patient 12-20, recommending abdominal binding.  Appears stable. Hb remain stable.    3-Peripheral vascular disease, with claudication previous stents to bilateral iliacs. Rt. SFA also.  Stent placement 2014. Follow with Dr. Alvester Chou. Hold Plavix in the setting of acute bleed. CT reviewed by Vascular Dr Saintclair Halsted, patient to follow up with Dr Gwenlyn Found .   4-Abdominal aortic aneurysm ([place]) Noted for slight increase in size to 3.5 cm.  Follow as outpatient.  5-BPH, bladder outlet obstruction on CT. Resume Flomax and finasteride.  Bladder scan.  Hypokalemia resolved.  HTN; no further low BP. Resume Norvasc.   Hyperglycemia: In the setting of IV steroids. Continue with sliding scale insulin.  Abdominal distension;  [place] negative for ascites.  KUB negative for obstruction.  Likely related to abdominal wall hematoma.  Continue with abdominal binder.   Leukocytosis;  Could be related to steroids.  Continue with antibiotics, ceftriaxone  and azithro,/   CKD stage II; monitor renal function.   RN Pressure Injury Documentation:    Malnutrition Type:      Malnutrition Characteristics:      Nutrition Interventions:     Estimated body mass index is 26.87 kg/m as  calculated from the following:   Height as of this encounter: 5\' 4"  (1.626 m).   Weight as of this encounter: 71 kg.   DVT prophylaxis: SCDs Code Status: Full code Family Communication:  multiples family member at bedside.  Disposition Plan: transfer to step down unit to monitor Hb, respiratory status and BP.   Consultants:  None  Procedures:   None   Antimicrobials: Doxycycline  Subjective: Became hypoxic, SOB when trying to use bedside commode.  Oxygen increase to 4 L.  When I saw him he was feeling better than earlier today. But still SOB. He is worry because he can not controlled when this episodes will happen. He was very active all his life.  He has hallucinations with ativan.    Objective: Vitals:   01/22/18 0659 01/22/18 0700 01/22/18 0701 01/22/18 0702  BP:  138/72    Pulse: (!) 109 (!) 110 (!) 110 (!) 110  Resp: (!) 21 (!) 21 (!) 22 19  Temp:      TempSrc:      SpO2: 100% 100% 99% 99%  Weight:      Height:        Intake/Output Summary (Last 24 hours) at 01/22/2018 0826 Last data filed at 01/22/2018 0000 Gross per 24 hour  Intake 669.13 ml  Output 975 ml  Net -305.87 ml   Filed Weights   01/18/18 1849  Weight: 71 kg    Examination:  General exam:  Tachypnea.  Respiratory system: Bilateral wheezing worse.  Cardiovascular system; S, 1 , S 2 RRR Gastrointestinal system: BS present, abdominal distended, large bruise, not worse, binder in place.  Central nervous system: Alert, follows command.  Extremities: no edema Skin: Large abdominal wall hematoma,    Data Reviewed: I have personally reviewed following labs and imaging studies  CBC: Recent Labs  Lab 01/03/2018 1234  01/19/18 0402  01/20/18 0103  01/20/18 1655 01/20/18 2310 01/21/18 0322 01/21/18 1455 01/22/18 0334  WBC 9.5  --  15.1*  --  21.0*  --   --   --  16.4*  --   --   NEUTROABS 7.1  --   --   --   --   --   --   --   --   --   --   HGB 11.1*   < > 9.6*   < > 10.3*   < > 10.6* 10.6* 9.4* 10.4* 10.1*  HCT 35.0*   < > 31.0*   < > 33.0*   < > 34.3* 34.6* 30.4* 33.9* 32.6*  MCV 90.4  --  93.9  --  94.0  --   --   --  94.4  --   --   PLT 187  --  196  --  222  --   --    --  190  --   --    < > = values in this interval not displayed.   Basic Metabolic Panel: Recent Labs  Lab 01/19/2018 1234 01/18/18 0239 01/19/18 0402 01/20/18 0103 01/21/18 0322  NA 139 138 139 139 138  K 3.2* 4.2 4.4 4.4 4.5  CL 104 105 105 105 105  CO2 25 24 23 24 25   GLUCOSE 113* 208* 219* 128* 159*  BUN 23 28* 33* 35* 36*  CREATININE 1.14 1.30* 1.29* 1.07 1.15  CALCIUM 8.6* 8.5* 8.7* 8.8* 8.4*  MG  --   --   --   --  2.2   GFR: Estimated Creatinine  Clearance: 40.8 mL/min (by C-G formula based on SCr of 1.15 mg/dL). Liver Function Tests: Recent Labs  Lab 01/02/2018 1234  AST 22  ALT 16  ALKPHOS 89  BILITOT 1.4*  PROT 6.6  ALBUMIN 3.4*   No results for input(s): LIPASE, AMYLASE in the last 168 hours. No results for input(s): AMMONIA in the last 168 hours. Coagulation Profile: Recent Labs  Lab 01/23/2018 1234  INR 0.89   Cardiac Enzymes: Recent Labs  Lab 01/19/2018 1234  TROPONINI <0.03   BNP (last 3 results) No results for input(s): PROBNP in the last 8760 hours. HbA1C: No results for input(s): HGBA1C in the last 72 hours. CBG: Recent Labs  Lab 01/21/18 0755 01/21/18 1145 01/21/18 1649 01/21/18 2204 01/22/18 0753  GLUCAP 163* 136* 191* 149* 160*   Lipid Profile: No results for input(s): CHOL, HDL, LDLCALC, TRIG, CHOLHDL, LDLDIRECT in the last 72 hours. Thyroid Function Tests: No results for input(s): TSH, T4TOTAL, FREET4, T3FREE, THYROIDAB in the last 72 hours. Anemia Panel: No results for input(s): VITAMINB12, FOLATE, FERRITIN, TIBC, IRON, RETICCTPCT in the last 72 hours. Sepsis Labs: No results for input(s): PROCALCITON, LATICACIDVEN in the last 168 hours.  Recent Results (from the past 240 hour(s))  Respiratory Panel by PCR     Status: None   Collection Time: 01/19/18  9:21 AM  Result Value Ref Range Status   Adenovirus NOT DETECTED NOT DETECTED Final   Coronavirus 229E NOT DETECTED NOT DETECTED Final   Coronavirus HKU1 NOT DETECTED NOT  DETECTED Final   Coronavirus NL63 NOT DETECTED NOT DETECTED Final   Coronavirus OC43 NOT DETECTED NOT DETECTED Final   Metapneumovirus NOT DETECTED NOT DETECTED Final   Rhinovirus / Enterovirus NOT DETECTED NOT DETECTED Final   Influenza A NOT DETECTED NOT DETECTED Final   Influenza B NOT DETECTED NOT DETECTED Final   Parainfluenza Virus 1 NOT DETECTED NOT DETECTED Final   Parainfluenza Virus 2 NOT DETECTED NOT DETECTED Final   Parainfluenza Virus 3 NOT DETECTED NOT DETECTED Final   Parainfluenza Virus 4 NOT DETECTED NOT DETECTED Final   Respiratory Syncytial Virus NOT DETECTED NOT DETECTED Final   Bordetella pertussis NOT DETECTED NOT DETECTED Final   Chlamydophila pneumoniae NOT DETECTED NOT DETECTED Final   Mycoplasma pneumoniae NOT DETECTED NOT DETECTED Final    Comment: Performed at [facility], [place]. [address] [place], [place] [address]  MRSA PCR Screening     Status: None   Collection Time: 01/20/18 11:21 AM  Result Value Ref Range Status   MRSA by PCR NEGATIVE NEGATIVE Final    Comment:        The GeneXpert MRSA Assay (FDA approved for NASAL specimens only), is one component of a comprehensive MRSA colonization surveillance program. It is not intended to diagnose MRSA infection nor to guide or monitor treatment for MRSA infections. Performed at [facility], [place] [address] [place],  [address]          Radiology Studies: No results found.      Scheduled Meds: . albuterol  2.5 mg Nebulization QID  . amLODipine  5 mg Oral Daily  . atorvastatin  20 mg Oral Daily  . azithromycin  500 mg Oral Daily  . benzonatate  200 mg Oral TID  . cefdinir  300 mg Oral Q12H  . chlorpheniramine-HYDROcodone  5 mL Oral Q12H  . cholecalciferol  2,000 Units Oral Daily  . docusate sodium  100 mg Oral BID  . famotidine  20 mg  Oral Daily  . finasteride  5 mg Oral Daily  . guaiFENesin-codeine  5 mL Oral QID  . insulin aspart  0-9 Units  Subcutaneous TID WC  . ipratropium  0.5 mg Nebulization Q4H  . loratadine  10 mg Oral Daily  . LORazepam  0.5 mg Intravenous Once  . magnesium oxide  400 mg Oral Daily  . methylPREDNISolone (SOLU-MEDROL) injection  60 mg Intravenous Q8H  . nebivolol  10 mg Oral Daily  . pantoprazole  40 mg Oral BID AC  . polyethylene glycol  17 g Oral BID  . senna  1 tablet Oral Daily  . tamsulosin  0.4 mg Oral Daily  . umeclidinium-vilanterol  1 puff Inhalation Daily   Continuous Infusions:    LOS: 5 days    Time spent: 35 minutes     Elmarie Shiley, MD [facility] Pager [phone]  If 7PM-7AM, please contact night-coverage [email or web address] Password [facility] 01/22/2018, 8:26 AM

## 2018-01-22 NOTE — Progress Notes (Signed)
NAME:  Douglas Monroe, MRN:  [id number], DOB:  04-23-1932, LOS: 5 ADMISSION DATE:  01/29/2018, CONSULTATION DATE: 01/19/2018 REFERRING MD: Tyrell Antonio, CHIEF COMPLAINT: COPD exacerbation, abdominal wall hematoma  Brief History   Patient was admitted to the hospital with complaints of protracted coughing and bruising on his left lower abdomen Patient of [name]- followed for chronic respiratory failure and severe chronic obstructive pulmonary disease   History of present illness   Was recently treated for upper respiratory infection he did follow-up with his primary doctor and then ended up in the ED a couple of days later He had persistent cough and wheezing which improved on amoxicillin, prednisone, antitussives The coughing and wheezing did recur honey was coughing vigorously-subsequently noted the abdominal wall hematoma secondary to having severe pain and discomfort Came into the hospital for further evaluation 12/21-feeling a little bit better today, still coughing as much, less abdominal pain and discomfort 12/22 still having problems with his breathing, mostly intermittently  Past Medical History  Advanced chronic obstructive pulmonary disease Chronic exertional dyspnea Peripheral vascular disease Hypertension Hyperlipidemia History of kidney stones First-degree heart block History of anxiety History of tubular adenoma of the colon  Significant Hospital Events    Abdomen feels less tense Has an abdominal binder on  Consults:  PCCM on 01/19/2018 Surgical input regarding his abdominal wall hematoma  Procedures:   Significant Diagnostic Tests:  Chest x-ray shows no acute infiltrate KUB shows no significant pathology  Micro Data:    Antimicrobials:  On doxycycline Omnicef 12/20>> Azithromycin 12/20>>  Interim history/subjective:  Shortness of breath, wheezing-earlier today, when I saw him was resting, falling asleep easily, spouse at bedside  Objective     Blood pressure 131/64, pulse 75, temperature 98 F (36.7 C), temperature source Oral, resp. rate (!) 21, height 5\' 4"  (1.626 m), weight 71 kg, SpO2 95 %.        Intake/Output Summary (Last 24 hours) at 01/22/2018 1148 Last data filed at 01/22/2018 1000 Gross per 24 hour  Intake 429 ml  Output 575 ml  Net -146 ml   Filed Weights   01/18/18 1849  Weight: 71 kg    Examination: General: Appears comfortable HENT: Moist oral mucosa Lungs: No wheezing, decreased air movement bilaterally Cardiovascular: S1-S2 appreciated Abdomen: Abdomen unchanged, binder not removed Extremities: No edema, no clubbing  Neuro: [facility] Problem list    Assessment & Plan:   Severe COPD with exacerbation -Severe obstructive defect on his most recent PFT-his FEV1 is 0.76 -Exacerbation likely related to a viral infection -Continue bronchodilators -Continue steroids-on IV steroids  -Complete course of antibiotics-currently on azithromycin and Rocephin  Has been complaining of difficulty following Pulmicort use-will discontinue Pulmicort  Abdominal wall hematoma secondary to protracted coughing -Optimize anti-tussives -Continue Tessalon Perles  -Optimize pain management -Continue with binder in place  Hypertension -Continue antihypertensives  Constipation -Resolved  Encouraged to mobilize a little bit more  Agree with continuing anxiolytics Discussed extensively with Dr. Tyrell Antonio -We did review the chest x-ray showing some haziness-not convincing for clear-cut infiltrate from my perspective, he has been on antibiotics We had a concern regarding possible DVT-though unlikely, ultrasound of the lower extremity is negative  I do believe he has significant symptoms secondary to having very poor reserves with an FEV1 of 0.76 earlier in the year when he was feeling well, with ongoing abdominal discomfort.  his intermittent shortness of breath is likely related to his poor  reserves with an ongoing exacerbation We  will continue lines of care Agree with anxiolytics Discussed extensively with family  Best practice:  Diet: Cardiac diet Pain/Anxiety/Delirium protocol (if indicated): Pain management per primary DVT prophylaxis: SCDs Mobility: As tolerated Code Status: Full code Disposition:   Labs   CBC: Recent Labs  Lab 01/14/2018 1234  01/19/18 0402  01/20/18 0103  01/20/18 1655 01/20/18 2310 01/21/18 0322 01/21/18 1455 01/22/18 0334  WBC 9.5  --  15.1*  --  21.0*  --   --   --  16.4*  --   --   NEUTROABS 7.1  --   --   --   --   --   --   --   --   --   --   HGB 11.1*   < > 9.6*   < > 10.3*   < > 10.6* 10.6* 9.4* 10.4* 10.1*  HCT 35.0*   < > 31.0*   < > 33.0*   < > 34.3* 34.6* 30.4* 33.9* 32.6*  MCV 90.4  --  93.9  --  94.0  --   --   --  94.4  --   --   PLT 187  --  196  --  222  --   --   --  190  --   --    < > = values in this interval not displayed.    Basic Metabolic Panel: Recent Labs  Lab 01/13/2018 1234 01/18/18 0239 01/19/18 0402 01/20/18 0103 01/21/18 0322  NA 139 138 139 139 138  K 3.2* 4.2 4.4 4.4 4.5  CL 104 105 105 105 105  CO2 25 24 23 24 25   GLUCOSE 113* 208* 219* 128* 159*  BUN 23 28* 33* 35* 36*  CREATININE 1.14 1.30* 1.29* 1.07 1.15  CALCIUM 8.6* 8.5* 8.7* 8.8* 8.4*  MG  --   --   --   --  2.2   GFR: Estimated Creatinine Clearance: 40.8 mL/min (by C-G formula based on SCr of 1.15 mg/dL). Recent Labs  Lab 01/24/2018 1234 01/19/18 0402 01/20/18 0103 01/21/18 0322  WBC 9.5 15.1* 21.0* 16.4*    Liver Function Tests: Recent Labs  Lab 01/08/2018 1234  AST 22  ALT 16  ALKPHOS 89  BILITOT 1.4*  PROT 6.6  ALBUMIN 3.4*   No results for input(s): LIPASE, AMYLASE in the last 168 hours. No results for input(s): AMMONIA in the last 168 hours.  ABG    Component Value Date/Time   HCO3 28.1 (H) 10/30/2006 2355   TCO2 25 08/20/2016 0351     Coagulation Profile: Recent Labs  Lab 01/03/2018 1234  INR 0.89     Cardiac Enzymes: Recent Labs  Lab 01/20/2018 1234  TROPONINI <0.03    HbA1C: Hgb A1c MFr Bld  Date/Time Value Ref Range Status  11/08/2017 12:04 PM 6.7 (H) 4.8 - 5.6 % Final    Comment:             Prediabetes: 5.7 - 6.4          Diabetes: >6.4          Glycemic control for adults with diabetes: <7.0   05/02/2017 6.4 (A) 4.0 - 6.0 Final    CBG: Recent Labs  Lab 01/21/18 0755 01/21/18 1145 01/21/18 1649 01/21/18 2204 01/22/18 0753  GLUCAP 163* 136* 191* 149* 160*    Review of Systems:   Review of Systems  Constitutional: Negative.  Negative for fever.  HENT: Negative.   Eyes: Negative.   Respiratory:  Positive for shortness of breath. Negative for cough.   Cardiovascular: Negative.  Negative for chest pain and orthopnea.  Gastrointestinal: Negative for constipation.  Skin: Negative.    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   Surgical History   Noted in records  Social History   reports that he quit smoking about 7 years ago. His smoking use included cigarettes. He has a 60.00 pack-year smoking history. He has never used smokeless tobacco. He reports that he does not drink alcohol or use drugs.   Family History   His family history includes Cancer in his father; Diabetes in his brother;  Hypertension in his mother and son.   Allergies Allergies  Allergen Reactions  . Meat Extract Other (See Comments)    Alpha gal, red meat allergy   . Ativan [Lorazepam]     Hallucination   . Pneumococcal Vaccines Swelling and Other (See Comments)    Lip swelling

## 2018-01-22 NOTE — Progress Notes (Signed)
Bilateral lower extremity venous duplex has been completed. Negative for DVT. Results were given to the patient's nurse, Allison.  01/22/18 10:42 AM Carlos Levering RVT

## 2018-01-23 DIAGNOSIS — J9601 Acute respiratory failure with hypoxia: Secondary | ICD-10-CM

## 2018-01-23 DIAGNOSIS — J189 Pneumonia, unspecified organism: Secondary | ICD-10-CM

## 2018-01-23 LAB — CBC
HCT: 34.2 % — ABNORMAL LOW (ref 39.0–52.0)
Hemoglobin: 10.7 g/dL — ABNORMAL LOW (ref 13.0–17.0)
MCH: 29 pg (ref 26.0–34.0)
MCHC: 31.3 g/dL (ref 30.0–36.0)
MCV: 92.7 fL (ref 80.0–100.0)
PLATELETS: 225 10*3/uL (ref 150–400)
RBC: 3.69 MIL/uL — ABNORMAL LOW (ref 4.22–5.81)
RDW: 16 % — ABNORMAL HIGH (ref 11.5–15.5)
WBC: 18.9 10*3/uL — ABNORMAL HIGH (ref 4.0–10.5)
nRBC: 0 % (ref 0.0–0.2)

## 2018-01-23 LAB — GLUCOSE, CAPILLARY
GLUCOSE-CAPILLARY: 188 mg/dL — AB (ref 70–99)
Glucose-Capillary: 147 mg/dL — ABNORMAL HIGH (ref 70–99)
Glucose-Capillary: 128 mg/dL — ABNORMAL HIGH (ref 70–99)
Glucose-Capillary: 151 mg/dL — ABNORMAL HIGH (ref 70–99)
Glucose-Capillary: 158 mg/dL — ABNORMAL HIGH (ref 70–99)
Glucose-Capillary: 139 mg/dL — ABNORMAL HIGH (ref 70–99)
Glucose-Capillary: 189 mg/dL — ABNORMAL HIGH (ref 70–99)

## 2018-01-23 LAB — BASIC METABOLIC PANEL
Anion gap: 11 (ref 5–15)
BUN: 39 mg/dL — AB (ref 8–23)
CO2: 27 mmol/L (ref 22–32)
Calcium: 8.8 mg/dL — ABNORMAL LOW (ref 8.9–10.3)
Chloride: 100 mmol/L (ref 98–111)
Creatinine, Ser: 1.19 mg/dL (ref 0.61–1.24)
GFR calc Af Amer: >60 mL/min (ref >60)
GFR calc non Af Amer: 56 mL/min — ABNORMAL LOW (ref >60)
Glucose, Bld: 217 mg/dL — ABNORMAL HIGH (ref 70–99)
POTASSIUM: 4.8 mmol/L (ref 3.5–5.1)
Sodium: 138 mmol/L (ref 135–145)

## 2018-01-23 MED ORDER — HYDROCOD POLST-CPM POLST ER 10-8 MG/5ML PO SUER
5.0000 mL | Freq: Once | ORAL | Status: AC
  Administered 2018-01-23: 5 mL via ORAL
  Filled 2018-01-23: qty 5

## 2018-01-23 NOTE — Progress Notes (Signed)
PT Cancellation Note  Patient Details Name: Douglas Monroe MRN: [id number] DOB: 10/09/1932   Cancelled Treatment:    Reason Eval/Treat Not Completed: Medical issues which prohibited therapy, patient reports that he is not coughing, is settled down and prefers to not get up right now. Check back as schedule allows.    [name] 01/23/2018, 11:37 AM  [place] Pager [phone] Office [phone]

## 2018-01-23 NOTE — Progress Notes (Signed)
PROGRESS NOTE    Douglas [place]  IPJ:[id number] DOB: Feb 18, 1932 DOA: 01/06/2018 PCP:    Brief Narrative: 84 year old with past medical history significant for COPD, not on home oxygen, peripheral vascular disease status post iliac stenting followed by Dr. Alvester Chou, hypertension, hyperlipidemia who was evaluated by his PCP 7 days a day ago for nonproductive cough and wheezing.  Patient was treated by his PCP with antibiotics and prednisone, he presented 2 days later to the emergency department with persistent cough wheezing worsening shortness of breath.  He was instructed to continue his prednisone and to finish antibiotics. He presents to the ED with worsening shortness of breath.  He was found to have rectus sheath hematoma, with 2 g drop of hemoglobin since last seen in the ED  Patient admitted with COPD exacerbation, and also found to have rectus sheath hematoma.  He was evaluated by surgery who recommended abdominal binder and blood transfusion as needed.  Hematoma appears to be stable, his hemoglobin has remained stable in the 10 range.. Regarding his COPD exacerbation, he he has severe COPD, and he is a slowly improving.  He is still get significant shortness of breath on minimal exertion.  CCM has been helping with his care.  Chest x-ray showed bilateral atelectasis versus infiltrate.  He is on IV ceftriaxone and azithromycin.  He is getting IV Solu-Medrol and nebulizer.  Xanax to help with anxiety.  Assessment & Plan:   Principal Problem:   COPD with acute exacerbation ([place]) Active Problems:   PVD (peripheral vascular disease) with claudication, previous stents to bilateral iliacs. Rt. SFA also.    PVD S/P multiple PCIs   HTN (hypertension)   Abdominal aortic aneurysm (HCC)   S/P arterial stent, to Lt renal art. 11/02/12   Rectus sheath hematoma, initial encounter   Acute respiratory failure with hypoxia (HCC)   1-Acute COPD exacerbation, acute hypoxic respiratory failure; Continue  with IV Solu-Medrol every 6 hours,  azithromycin, IV ceftriaxone.  nebulizer treatment. Respiratory failure related to COPD exacerbation and restriction component form abdominal wall hematoma.  Robitussin schedule guaifenesin with codeine.  Episode this am, notice. Patient became more hypoxic and SOB when getting to commode. He was also anxious. He was place on NB mask. Subsequently 4 L oxygen/. Chest x ray reviewed with Dr Ander Slade, consistent with more atelectasis. Patient already on antibiotics.  -will stop anoro. Resume ipratropium, continue with albuterol. Stop Pulmicort patient report feeling worse after Pulmicort.  -Continue with Xanax to TID PRN, careful with oversedation.  -Patient FEV1 0.76 at baseline -He is now back on 2 L of oxygen, cough persist but is not worse.  He is still getting short of breath on exertion. I will continue to monitor in the stepdown unit.. -Dopplers negative for DVT.  2-Rectus sheath hematoma with acute blood loss anemia; Presumed to be after a vigorous coughing spell Hemoglobin today has remained stable around 10. Hb drifting down. Surgery consulted and following. Recommending abdominal binding.  Continue to hold Plavix Patient develops worsening abdominal pain and worsening localized distension.  Transfer to step down unit for closer monitoring.  Monitor hb, blood transfusion as needed.  Surgery follow up on patient 12-20, recommending abdominal binding.  Hemoglobin remained stable, in the 10 range.  I think is reasonable now just to repeat hemoglobin in the morning   3-Peripheral vascular disease, with claudication previous stents to bilateral iliacs. Rt. SFA also.  Stent placement 2014. Follow with Dr. Alvester Chou. Hold Plavix in the setting of acute bleed.  CT reviewed by Vascular Dr Saintclair Halsted, patient to follow up with Dr Gwenlyn Found .   4-Abdominal aortic aneurysm ([place]) Noted for slight increase in size to 3.5 cm.  Follow as outpatient.  5-BPH, bladder outlet  obstruction on CT. Resume Flomax and finasteride.    Hypokalemia resolved.  HTN; no further low BP. Resume Norvasc.   Hyperglycemia: In the setting of IV steroids. Continue with sliding scale insulin.  Abdominal distension;  [place] negative for ascites.  KUB negative for obstruction.  Likely related to abdominal wall hematoma.  Continue with abdominal binder.   Leukocytosis;  Could be related to steroids.  Continue with antibiotics, ceftriaxone  and azithro,/   CKD stage II; monitor renal function.   RN Pressure Injury Documentation:    Malnutrition Type:      Malnutrition Characteristics:      Nutrition Interventions:     Estimated body mass index is 26.87 kg/m as calculated from the following:   Height as of this encounter: 5\' 4"  (1.626 m).   Weight as of this encounter: 71 kg.   DVT prophylaxis: SCDs Code Status: Full code Family Communication: multiples family member at bedside.  Disposition Plan: transfer to step down unit to monitor Hb, respiratory status and BP.   Consultants:  None  Procedures:   None   Antimicrobials: Doxycycline  Subjective: He was able to sleep well last night.  He feels that his breathing a little bit better. He require cough medicine last night.   Objective: Vitals:   01/23/18 0545 01/23/18 0600 01/23/18 0820 01/23/18 0916  BP:  (!) 146/49 (!) 160/66   Pulse:  66 93   Resp:  (!) 26 19   Temp: 97.6 F (36.4 C)     TempSrc: Axillary     SpO2:  100% 96% 96%  Weight:      Height:        Intake/Output Summary (Last 24 hours) at 01/23/2018 1145 Last data filed at 01/23/2018 1000 Gross per 24 hour  Intake 900 ml  Output 1100 ml  Net -200 ml   Filed Weights   01/18/18 1849  Weight: 71 kg    Examination:  General exam: Tachypnea Respiratory system: Bilateral wheezing improved from yesterday Cardiovascular system; 1, S2 regular rhythm and rate Gastrointestinal system: Bowel sounds present, distended  soft abdominal binder in place Central nervous system: Alert and oriented follows command Extremities: No Edema Skin: Large abdominal wall hematoma,    Data Reviewed: I have personally reviewed following labs and imaging studies  CBC: Recent Labs  Lab 01/10/2018 1234  01/19/18 0402  01/20/18 0103  01/21/18 0322 01/21/18 1455 01/22/18 0334 01/22/18 1932 01/23/18 0307  WBC 9.5  --  15.1*  --  21.0*  --  16.4*  --   --   --  18.9*  NEUTROABS 7.1  --   --   --   --   --   --   --   --   --   --   HGB 11.1*   < > 9.6*   < > 10.3*   < > 9.4* 10.4* 10.1* 10.3* 10.7*  HCT 35.0*   < > 31.0*   < > 33.0*   < > 30.4* 33.9* 32.6* 33.5* 34.2*  MCV 90.4  --  93.9  --  94.0  --  94.4  --   --   --  92.7  PLT 187  --  196  --  222  --  190  --   --   --  225   < > = values in this interval not displayed.   Basic Metabolic Panel: Recent Labs  Lab 01/18/18 0239 01/19/18 0402 01/20/18 0103 01/21/18 0322 01/23/18 0307  NA 138 139 139 138 138  K 4.2 4.4 4.4 4.5 4.8  CL 105 105 105 105 100  CO2 24 23 24 25 27   GLUCOSE 208* 219* 128* 159* 217*  BUN 28* 33* 35* 36* 39*  CREATININE 1.30* 1.29* 1.07 1.15 1.19  CALCIUM 8.5* 8.7* 8.8* 8.4* 8.8*  MG  --   --   --  2.2  --    GFR: Estimated Creatinine Clearance: 39.4 mL/min (by C-G formula based on SCr of 1.19 mg/dL). Liver Function Tests: Recent Labs  Lab 01/13/2018 1234  AST 22  ALT 16  ALKPHOS 89  BILITOT 1.4*  PROT 6.6  ALBUMIN 3.4*   No results for input(s): LIPASE, AMYLASE in the last 168 hours. No results for input(s): AMMONIA in the last 168 hours. Coagulation Profile: Recent Labs  Lab 01/03/2018 1234  INR 0.89   Cardiac Enzymes: Recent Labs  Lab 01/02/2018 1234  TROPONINI <0.03   BNP (last 3 results) No results for input(s): PROBNP in the last 8760 hours. HbA1C: No results for input(s): HGBA1C in the last 72 hours. CBG: Recent Labs  Lab 01/22/18 0753 01/22/18 1214 01/22/18 1642 01/22/18 2120 01/23/18 0806  GLUCAP  160* 147* 128* 151* 158*   Lipid Profile: No results for input(s): CHOL, HDL, LDLCALC, TRIG, CHOLHDL, LDLDIRECT in the last 72 hours. Thyroid Function Tests: No results for input(s): TSH, T4TOTAL, FREET4, T3FREE, THYROIDAB in the last 72 hours. Anemia Panel: No results for input(s): VITAMINB12, FOLATE, FERRITIN, TIBC, IRON, RETICCTPCT in the last 72 hours. Sepsis Labs: No results for input(s): PROCALCITON, LATICACIDVEN in the last 168 hours.  Recent Results (from the past 240 hour(s))  Respiratory Panel by PCR     Status: None   Collection Time: 01/19/18  9:21 AM  Result Value Ref Range Status   Adenovirus NOT DETECTED NOT DETECTED Final   Coronavirus 229E NOT DETECTED NOT DETECTED Final   Coronavirus HKU1 NOT DETECTED NOT DETECTED Final   Coronavirus NL63 NOT DETECTED NOT DETECTED Final   Coronavirus OC43 NOT DETECTED NOT DETECTED Final   Metapneumovirus NOT DETECTED NOT DETECTED Final   Rhinovirus / Enterovirus NOT DETECTED NOT DETECTED Final   Influenza A NOT DETECTED NOT DETECTED Final   Influenza B NOT DETECTED NOT DETECTED Final   Parainfluenza Virus 1 NOT DETECTED NOT DETECTED Final   Parainfluenza Virus 2 NOT DETECTED NOT DETECTED Final   Parainfluenza Virus 3 NOT DETECTED NOT DETECTED Final   Parainfluenza Virus 4 NOT DETECTED NOT DETECTED Final   Respiratory Syncytial Virus NOT DETECTED NOT DETECTED Final   Bordetella pertussis NOT DETECTED NOT DETECTED Final   Chlamydophila pneumoniae NOT DETECTED NOT DETECTED Final   Mycoplasma pneumoniae NOT DETECTED NOT DETECTED Final    Comment: Performed at [facility], [place]. [address] [place], [place] [address]  MRSA PCR Screening     Status: None   Collection Time: 01/20/18 11:21 AM  Result Value Ref Range Status   MRSA by PCR NEGATIVE NEGATIVE Final    Comment:        The GeneXpert MRSA Assay (FDA approved for NASAL specimens only), is one component of a comprehensive MRSA colonization surveillance program. It is  not intended to diagnose MRSA infection nor to guide or monitor treatment for MRSA infections. Performed at [facility], [place] [address] [place], [place] [address]          Radiology Studies: Dg Chest Port 1 View  Result Date: 01/22/2018 CLINICAL DATA:  Hypoxia and shortness of breath. EXAM: PORTABLE CHEST 1 VIEW COMPARISON:  01/19/2018 FINDINGS: The heart size and mediastinal contours are stable. There is some progressive bibasilar airspace disease likely reflecting acute pneumonia. There also is some increased nodularity in the right upper lobe potentially representing pneumonia. Stable emphysematous lung disease. No pleural effusions or pneumothorax. IMPRESSION: Progressive bibasilar airspace disease likely reflecting acute pneumonia. Increased nodularity in the right upper lobe also felt to likely represent pneumonia. Electronically Signed   By: Aletta Edouard M.D.   On: 01/22/2018 09:44   Vas [place] Lower Extremity Venous (dvt)  Result Date: 01/22/2018  Lower Venous Study Indications: Edema, and Tachycardia.  Limitations: Poor ultrasound/tissue interface and patient positioning. Performing Technologist: Oliver Hum RVT  Examination Guidelines: A complete evaluation includes B-mode imaging, spectral Doppler, color Doppler, and power Doppler as needed of all accessible portions of each vessel. Bilateral testing is considered an integral part of a complete examination. Limited examinations for reoccurring indications may be performed as noted.  Right Venous Findings: +---------+---------------+---------+-----------+----------+-------+          CompressibilityPhasicitySpontaneityPropertiesSummary +---------+---------------+---------+-----------+----------+-------+ CFV      Full           Yes      Yes                          +---------+---------------+---------+-----------+----------+-------+ SFJ      Full                                                  +---------+---------------+---------+-----------+----------+-------+ FV Prox  Full                                                 +---------+---------------+---------+-----------+----------+-------+ FV Mid   Full                                                 +---------+---------------+---------+-----------+----------+-------+ FV DistalFull                                                 +---------+---------------+---------+-----------+----------+-------+ PFV      Full                                                 +---------+---------------+---------+-----------+----------+-------+ POP      Full           Yes      Yes                          +---------+---------------+---------+-----------+----------+-------+ PTV      Full                                                 +---------+---------------+---------+-----------+----------+-------+  PERO     Full                                                 +---------+---------------+---------+-----------+----------+-------+  Left Venous Findings: +---------+---------------+---------+-----------+----------+-------+          CompressibilityPhasicitySpontaneityPropertiesSummary +---------+---------------+---------+-----------+----------+-------+ CFV      Full           Yes      Yes                          +---------+---------------+---------+-----------+----------+-------+ SFJ      Full                                                 +---------+---------------+---------+-----------+----------+-------+ FV Prox  Full                                                 +---------+---------------+---------+-----------+----------+-------+ FV Mid   Full                                                 +---------+---------------+---------+-----------+----------+-------+ FV DistalFull                                                  +---------+---------------+---------+-----------+----------+-------+ PFV      Full                                                 +---------+---------------+---------+-----------+----------+-------+ POP      Full           Yes      Yes                          +---------+---------------+---------+-----------+----------+-------+ PTV      Full                                                 +---------+---------------+---------+-----------+----------+-------+ PERO     Full                                                 +---------+---------------+---------+-----------+----------+-------+    Summary: Right: There is no evidence of deep vein thrombosis in the lower extremity. No cystic structure found in the popliteal fossa. Left: There is no evidence of deep vein thrombosis in the lower extremity. No cystic structure found in the popliteal fossa.  *  See table(s) above for measurements and observations.    Preliminary         Scheduled Meds: . amLODipine  5 mg Oral Daily  . atorvastatin  20 mg Oral Daily  . azithromycin  500 mg Oral Daily  . benzonatate  200 mg Oral TID  . chlorpheniramine-HYDROcodone  5 mL Oral Q12H  . cholecalciferol  2,000 Units Oral Daily  . docusate sodium  100 mg Oral BID  . famotidine  20 mg Oral Daily  . finasteride  5 mg Oral Daily  . guaiFENesin-codeine  5 mL Oral QID  . insulin aspart  0-9 Units Subcutaneous TID WC  . ipratropium-albuterol  3 mL Nebulization QID  . loratadine  10 mg Oral Daily  . LORazepam  0.5 mg Intravenous Once  . magnesium oxide  400 mg Oral Daily  . mouth rinse  15 mL Mouth Rinse BID  . methylPREDNISolone (SOLU-MEDROL) injection  60 mg Intravenous Q8H  . nebivolol  10 mg Oral Daily  . pantoprazole  40 mg Oral BID AC  . polyethylene glycol  17 g Oral BID  . senna  1 tablet Oral Daily  . tamsulosin  0.4 mg Oral Daily   Continuous Infusions: . cefTRIAXone (ROCEPHIN)  IV Stopped (01/22/18 1659)     LOS: 6 days     Time spent: 35 minutes     Elmarie Shiley, MD [facility] Pager [phone]  If 7PM-7AM, please contact night-coverage [email or web address] Password [facility] 01/23/2018, 11:45 AM

## 2018-01-23 NOTE — Progress Notes (Signed)
NAME:  LENIN KUHNLE, MRN:  [id number], DOB:  Sep 05, 1932, LOS: 6 ADMISSION DATE:  01/15/2018, CONSULTATION DATE: 01/19/2018 REFERRING MD: Tyrell Antonio, CHIEF COMPLAINT: COPD exacerbation, abdominal wall hematoma  Brief History   Patient was admitted to the hospital with complaints of protracted coughing and bruising on his left lower abdomen Patient of [name]- followed for chronic respiratory failure and severe chronic obstructive pulmonary disease   History of present illness   Was recently treated for upper respiratory infection he did follow-up with his primary doctor and then ended up in the ED a couple of days later He had persistent cough and wheezing which improved on amoxicillin, prednisone, antitussives The coughing and wheezing did recur honey was coughing vigorously-subsequently noted the abdominal wall hematoma secondary to having severe pain and discomfort Came into the hospital for further evaluation 12/21-feeling a little bit better today, still coughing as much, less abdominal pain and discomfort 12/22 still having problems with his breathing, mostly intermittently  Past Medical History  Advanced chronic obstructive pulmonary disease Chronic exertional dyspnea Peripheral vascular disease Hypertension Hyperlipidemia History of kidney stones First-degree heart block History of anxiety History of tubular adenoma of the colon  Significant Hospital Events    Abdomen feels less tense Has an abdominal binder on  Consults:  PCCM on 01/19/2018 Surgical input regarding his abdominal wall hematoma  Procedures:   Significant Diagnostic Tests:  Chest x-ray shows no acute infiltrate KUB shows no significant pathology  Micro Data:    Antimicrobials:  Off doxycycline Rocephin 12/20>> Azithromycin 12/20>>  Interim history/subjective:  Feels better, O2 demand down to 2L with sats of 96%  Objective   Blood pressure (!) 160/66, pulse 93, temperature 97.6 F  (36.4 C), temperature source Axillary, resp. rate 19, height 5\' 4"  (1.626 m), weight 71 kg, SpO2 96 %.        Intake/Output Summary (Last 24 hours) at 01/23/2018 0850 Last data filed at 01/23/2018 0800 Gross per 24 hour  Intake 1020 ml  Output 1100 ml  Net -80 ml   Filed Weights   01/18/18 1849  Weight: 71 kg   Examination: General: Chronically ill appearing male, NAD HENT: [place]/AT, PERRL, EOM-I and MMM Lungs: Decreased BS diffusely Cardiovascular: RRR, Nl S1/S2 and -M/R/G Abdomen: Soft, NT, ND and +BS Extremities: -edema and -tenderness Neuro: Intact Skin: Massive hematoma on the left abdominal wall that is very impressive   I reviewed CXR myself, infiltrate noted  Resolved Hospital Problem list    Assessment & Plan:   Severe COPD with exacerbation -Severe obstructive defect on his most recent PFT-his FEV1 is 0.76 -Continue bronchodilators -Continue IV steroids for now -Rocephin/zithromax for total of 7/5 days respectively  -Will need to be on a ICS/LABA and duonebs combo as outpatient once steroids and abx are off  Hypoxemia: not on home O2 -Titrate O2 for sat of 88-92% -Will need an ambulatory desaturation study for home O2 when CAP improves  Abdominal wall hematoma secondary to protracted coughing -Anti-tussives as ordered -Tessalon pearls -Minimize narcotic use -Maintain on binder  Hypertension -Home anti-HTN as ordered  Constipation -Resolved  Discussed with PCCM-NP.  Best practice:  Diet: Cardiac diet Pain/Anxiety/Delirium protocol (if indicated): Pain management per primary DVT prophylaxis: SCDs Mobility: As tolerated Code Status: Full code Disposition:   Labs   CBC: Recent Labs  Lab 01/11/2018 1234  01/19/18 0402  01/20/18 0103  01/21/18 0322 01/21/18 1455 01/22/18 0334 01/22/18 1932 01/23/18 0307  WBC 9.5  --  15.1*  --  21.0*  --  16.4*  --   --   --  18.9*  NEUTROABS 7.1  --   --   --   --   --   --   --   --   --   --   HGB 11.1*    < > 9.6*   < > 10.3*   < > 9.4* 10.4* 10.1* 10.3* 10.7*  HCT 35.0*   < > 31.0*   < > 33.0*   < > 30.4* 33.9* 32.6* 33.5* 34.2*  MCV 90.4  --  93.9  --  94.0  --  94.4  --   --   --  92.7  PLT 187  --  196  --  222  --  190  --   --   --  225   < > = values in this interval not displayed.    Basic Metabolic Panel: Recent Labs  Lab 01/18/18 0239 01/19/18 0402 01/20/18 0103 01/21/18 0322 01/23/18 0307  NA 138 139 139 138 138  K 4.2 4.4 4.4 4.5 4.8  CL 105 105 105 105 100  CO2 24 23 24 25 27   GLUCOSE 208* 219* 128* 159* 217*  BUN 28* 33* 35* 36* 39*  CREATININE 1.30* 1.29* 1.07 1.15 1.19  CALCIUM 8.5* 8.7* 8.8* 8.4* 8.8*  MG  --   --   --  2.2  --    GFR: Estimated Creatinine Clearance: 39.4 mL/min (by C-G formula based on SCr of 1.19 mg/dL). Recent Labs  Lab 01/19/18 0402 01/20/18 0103 01/21/18 0322 01/23/18 0307  WBC 15.1* 21.0* 16.4* 18.9*    Liver Function Tests: Recent Labs  Lab 01/31/2018 1234  AST 22  ALT 16  ALKPHOS 89  BILITOT 1.4*  PROT 6.6  ALBUMIN 3.4*   No results for input(s): LIPASE, AMYLASE in the last 168 hours. No results for input(s): AMMONIA in the last 168 hours.  ABG    Component Value Date/Time   HCO3 28.1 (H) 10/30/2006 2355   TCO2 25 08/20/2016 0351     Coagulation Profile: Recent Labs  Lab 01/14/2018 1234  INR 0.89    Cardiac Enzymes: Recent Labs  Lab 01/19/2018 1234  TROPONINI <0.03    HbA1C: Hgb A1c MFr Bld  Date/Time Value Ref Range Status  11/08/2017 12:04 PM 6.7 (H) 4.8 - 5.6 % Final    Comment:             Prediabetes: 5.7 - 6.4          Diabetes: >6.4          Glycemic control for adults with diabetes: <7.0   05/02/2017 6.4 (A) 4.0 - 6.0 Final   CBG: Recent Labs  Lab 01/22/18 0753 01/22/18 1214 01/22/18 1642 01/22/18 2120 01/23/18 0806  GLUCAP 160* 147* 128* 151* 158*   Past Medical History  He,  has a past medical history of Abdominal aortic aneurysm ([place]), Allergy to alpha-gal, Anxiety, Arthritis,  Cancer of skin of temple (2017), COPD with emphysema ([place]), Dyspnea, Exertional dyspnea, First degree heart block, GERD (gastroesophageal reflux disease), Headache(784.0), History of kidney stones, HOH (hard of hearing), HTN (hypertension) (08/20/2011), Hyperlipidemia, Hypertension, Ischemic colitis ([place]), Left carotid artery stenosis, Migraine, 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   Surgical History   Noted in records  Social History  reports that he quit smoking about 7 years ago. His smoking use included cigarettes. He has a 60.00 pack-year smoking history. He has never used smokeless tobacco. He reports that he does not drink alcohol or use drugs.   Family History   His family history includes Cancer in his father; Diabetes in his brother; Hypertension in his mother and son.   Allergies Allergies  Allergen Reactions  . Meat Extract Other (See Comments)    Alpha gal, red meat allergy   . Ativan [Lorazepam]     Hallucination   . Pneumococcal Vaccines Swelling and Other (See Comments)    Lip swelling    Rush Farmer, M.D. [facility] Pulmonary/Critical Care Medicine. Pager: [phone]. After hours pager: [phone]

## 2018-01-24 ENCOUNTER — Inpatient Hospital Stay (HOSPITAL_COMMUNITY): Payer: Medicare Other

## 2018-01-24 DIAGNOSIS — I10 Essential (primary) hypertension: Secondary | ICD-10-CM

## 2018-01-24 LAB — BASIC METABOLIC PANEL
Anion gap: 8 (ref 5–15)
BUN: 40 mg/dL — ABNORMAL HIGH (ref 8–23)
CALCIUM: 8.5 mg/dL — AB (ref 8.9–10.3)
CO2: 27 mmol/L (ref 22–32)
Chloride: 103 mmol/L (ref 98–111)
Creatinine, Ser: 1.01 mg/dL (ref 0.61–1.24)
GFR calc Af Amer: >60 mL/min (ref >60)
GFR calc non Af Amer: >60 mL/min (ref >60)
Glucose, Bld: 166 mg/dL — ABNORMAL HIGH (ref 70–99)
Potassium: 4.3 mmol/L (ref 3.5–5.1)
Sodium: 138 mmol/L (ref 135–145)

## 2018-01-24 LAB — CBC
HCT: 31.9 % — ABNORMAL LOW (ref 39.0–52.0)
Hemoglobin: 9.8 g/dL — ABNORMAL LOW (ref 13.0–17.0)
MCH: 28.9 pg (ref 26.0–34.0)
MCHC: 30.7 g/dL (ref 30.0–36.0)
MCV: 94.1 fL (ref 80.0–100.0)
Platelets: 170 10*3/uL (ref 150–400)
RBC: 3.39 MIL/uL — ABNORMAL LOW (ref 4.22–5.81)
RDW: 16 % — ABNORMAL HIGH (ref 11.5–15.5)
WBC: 16.4 10*3/uL — ABNORMAL HIGH (ref 4.0–10.5)
nRBC: 0 % (ref 0.0–0.2)

## 2018-01-24 LAB — GLUCOSE, CAPILLARY
Glucose-Capillary: 168 mg/dL — ABNORMAL HIGH (ref 70–99)
Glucose-Capillary: 170 mg/dL — ABNORMAL HIGH (ref 70–99)
Glucose-Capillary: 142 mg/dL — ABNORMAL HIGH (ref 70–99)
Glucose-Capillary: 183 mg/dL — ABNORMAL HIGH (ref 70–99)

## 2018-01-24 MED ORDER — METHYLPREDNISOLONE SODIUM SUCC 40 MG IJ SOLR
40.0000 mg | Freq: Three times a day (TID) | INTRAMUSCULAR | Status: DC
  Administered 2018-01-24 – 2018-01-27 (×9): 40 mg via INTRAVENOUS
  Filled 2018-01-24 (×9): qty 1

## 2018-01-24 MED ORDER — HYDROCOD POLST-CPM POLST ER 10-8 MG/5ML PO SUER: 5.0000 mL | Freq: Once | ORAL | Status: DC

## 2018-01-24 MED ORDER — FLUTICASONE PROPIONATE 50 MCG/ACT NA SUSP
1.0000 | Freq: Every day | NASAL | Status: DC
  Administered 2018-01-24 – 2018-02-01 (×9): 1 via NASAL
  Filled 2018-01-24: qty 16

## 2018-01-24 NOTE — Progress Notes (Signed)
Around 0030, pt began to [place] in mid 80's. Nasal cannula was increased to 6 liters with minimal improvement. Respiratory was called and pt was placed on venturi mask at 40% with O2 at 98%. Will continue to monitor.

## 2018-01-24 NOTE — Progress Notes (Signed)
Physical Therapy Treatment Patient Details Name: Douglas Monroe MRN: [id number] DOB: [birth year] Today's Date: 01/24/2018    History of Present Illness Pt is a 84 yo male admitted 12/17 with large rectal sheath hematoma due to coughing and COPD exacerbation with acute respiratory failure with hypoxia. Pt with previous admission on 12/11, d/ced on the 15th for similar issues. PMH includes HTN, GERD, PVD, AAA s/p stenting 2014, anxiety,  first degree heart block, HOH, HLD, ischemic colitis, L carotid artery stenosis, multiple urological surgeries, lumbar surgery.    PT Comments    Patient is feeling better. Ambulation shortened due to transporting to xray. Continue PT.    Follow Up Recommendations  Supervision for mobility/OOB;No PT follow up     Equipment Recommendations  None recommended by PT    Recommendations for Other Services       Precautions / Restrictions Precautions Precautions: Fall Precaution Comments: monitor sats    Mobility  Bed Mobility               General bed mobility comments: in recliner  Transfers Overall transfer level: Needs assistance Equipment used: Rolling walker (2 wheeled) Transfers: Sit to/from Stand Sit to Stand: Min guard         General transfer comment: cues for safety , is impulsive  Ambulation/Gait Ambulation/Gait assistance: Min guard Gait Distance (Feet): 30 Feet Assistive device: Rolling walker (2 wheeled) Gait Pattern/deviations: Step-through pattern     General Gait Details: on 2 liters. sats 91 %. cues for pursed lip breaths   Stairs             Wheelchair Mobility    Modified Rankin (Stroke Patients Only)       Balance                                            Cognition Arousal/Alertness: Awake/alert Behavior During Therapy: Impulsive;WFL for tasks assessed/performed                                          Exercises      General Comments         Pertinent Vitals/Pain Pain Assessment: No/denies pain    Home Living Family/patient expects to be discharged to:: Private residence                    Prior Function            PT Goals (current goals can now be found in the care plan section) Progress towards PT goals: Progressing toward goals    Frequency    Min 3X/week      PT Plan Current plan remains appropriate    Co-evaluation              AM-PAC PT "6 Clicks" Mobility   Outcome Measure  Help needed turning from your back to your side while in a flat bed without using bedrails?: None Help needed moving from lying on your back to sitting on the side of a flat bed without using bedrails?: None Help needed moving to and from a bed to a chair (including a wheelchair)?: A Little Help needed standing up from a chair using your arms (e.g., wheelchair or bedside chair)?: A Little Help needed to walk in hospital  room?: A Little Help needed climbing 3-5 steps with a railing? : A Lot 6 Click Score: 19    End of Session   Activity Tolerance: Patient tolerated treatment well Patient left: in chair(going to xray) Nurse Communication: Mobility status PT Visit Diagnosis: Other abnormalities of gait and mobility (R26.89)     Time: 2883-3744 PT Time Calculation (min) (ACUTE ONLY): 10 min  Charges:  $Gait Training: 8-22 mins                     [place] Pager [phone] Office [phone]    [name] 01/24/2018, 1:55 PM

## 2018-01-24 NOTE — Progress Notes (Addendum)
Modified Barium Swallow Progress Note  Patient Details  Name: Douglas Monroe MRN: [id number] Date of Birth: May 22, 1932  Today's Date: 01/24/2018  Modified Barium Swallow completed.  Full report located under Chart Review in the Imaging Section.  Brief recommendations include the following:  Clinical Impression  Pt presents with mild oropharyngeal dysphagia without aspiration of any consistency tested.  He did mildly penetrate with thin liquids above vocal cords before the swallow with tsp and during with cup/straw.  Chin tuck posture prevented penetration - but uncertain if pt can conduct consistently.  Mild pharyngeal residuals (with liquids more than solids) present without pt consistent sensation.  Cued dry and reflexive swallows decrease residuals.  Barium tablet taken with thin appeared to lodge at distal esophagus without pt awareness.    Appearance of backflow of liquids - suspect pt's "choking" may be due to his esophageal issues.  Pt was noted to become dyspneic and cough during the MBS but no aspiration observed.  Using live video monitoring, pt educated to findings/recommendations.   Recommend to consider dedicated esophageal evaluation during this hospital coarse to help delineate dysphagia and determine if mitigation/intevention may be warranted to maximize pt's comfort level with intake.    Swallow Evaluation Recommendations       SLP Diet Recommendations: Regular solids;Thin liquid   Liquid Administration via: Cup;Straw   Medication Administration: Other (Comment)   Supervision: Patient able to self feed   Compensations: Slow rate;Small sips/bites   Postural Changes: Remain semi-upright after after feeds/meals (Comment);Seated upright at 90 degrees   Oral Care Recommendations: Oral care BID     Luanna Salk, MS [place] Pager [phone] Office [phone]    Macario Golds 01/24/2018,3:19 PM

## 2018-01-24 NOTE — Progress Notes (Signed)
Pt taken off telemetry per order to go to x-ray for a modified barium swallow

## 2018-01-24 NOTE — Progress Notes (Signed)
NAME:  Douglas Monroe, MRN:  [id number], DOB:  October 21, 1932, LOS: 7 ADMISSION DATE:  01/20/2018, CONSULTATION DATE: 01/19/2018 REFERRING MD: Tyrell Antonio, CHIEF COMPLAINT: COPD exacerbation, abdominal wall hematoma  Brief History   Patient was admitted to the hospital with complaints of protracted coughing and bruising on his left lower abdomen Patient of [name]- followed for chronic respiratory failure and severe chronic obstructive pulmonary disease  History of present illness   Was recently treated for upper respiratory infection he did follow-up with his primary doctor and then ended up in the ED a couple of days later He had persistent cough and wheezing which improved on amoxicillin, prednisone, antitussives The coughing and wheezing did recur honey was coughing vigorously-subsequently noted the abdominal wall hematoma secondary to having severe pain and discomfort Came into the hospital for further evaluation 12/21-feeling a little bit better today, still coughing as much, less abdominal pain and discomfort 12/22 still having problems with his breathing, mostly intermittently  Past Medical History  Advanced chronic obstructive pulmonary disease Chronic exertional dyspnea Peripheral vascular disease Hypertension Hyperlipidemia History of kidney stones First-degree heart block History of anxiety History of tubular adenoma of the colon  Significant Hospital Events    Abdomen feels less tense Has an abdominal binder on  Consults:  PCCM on 01/19/2018 Surgical input regarding his abdominal wall hematoma  Procedures:   Significant Diagnostic Tests:  Chest x-ray shows no acute infiltrate KUB shows no significant pathology  Micro Data:  RVP negative MRSA negative  Antimicrobials:  Off doxycycline Rocephin 12/20>> Azithromycin 12/20>>  Interim history/subjective:  Feels better, FiO2 of 2L [place], no events, no new complaints  Objective   Blood pressure (!) 167/71,  pulse 70, temperature 98.1 F (36.7 C), temperature source Oral, resp. rate (!) 21, height 5\' 4"  (1.626 m), weight 71 kg, SpO2 94 %.    FiO2 (%):  [30 %-40 %] 30 %   Intake/Output Summary (Last 24 hours) at 01/24/2018 0820 Last data filed at 01/24/2018 0700 Gross per 24 hour  Intake 120 ml  Output 1050 ml  Net -930 ml   Filed Weights   01/18/18 1849  Weight: 71 kg   Examination: General: Chronically ill appearing male, NAD HENT: /AT, PERRL, EOM-I and MMM Lungs: Decreased BS diffusely, no wheezing Cardiovascular: RRR, Nl S1/S2 and -M/R/G Abdomen: Soft, NT, ND and +BS Extremities: -edema and -tenderness Neuro: Intact Skin: Massive hematoma on the left abdominal wall that is very impressive   I reviewed CXR myself, infiltrate noted  Resolved Hospital Problem list    Assessment & Plan:   Severe COPD with exacerbation -Severe obstructive defect on his most recent PFT-his FEV1 is 0.76 -Continue bronchodilators -Continue IV steroids and begin taper once off abx -Rocephin/zithromax for total of 7/5 days respectively  -Will need to be on a ICS/LABA and duonebs combo as outpatient once steroids and abx are off  Hypoxemia: not on home O2, will need to continue to taper as below -Titrate O2 for sat of 88-92% -Will need an ambulatory desaturation study for home O2 when CAP improves  Abdominal wall hematoma secondary to protracted coughing -Anti-tussives as ordered -Tessalon pearls -Minimize narcotic use -Maintain on binder  Hypertension -Home anti-HTN as ordered  Constipation -Resolved  Discussed with PCCM-NP  PCCM will sign off, please call back if needed.  Best practice:  Diet: Cardiac diet Pain/Anxiety/Delirium protocol (if indicated): Pain management per primary DVT prophylaxis: SCDs Mobility: As tolerated Code Status: Full code Disposition:   Labs  CBC: Recent Labs  Lab 01/25/2018 1234  01/19/18 0402  01/20/18 0103  01/21/18 0322 01/21/18 1455  01/22/18 0334 01/22/18 1932 01/23/18 0307 01/24/18 0300  WBC 9.5  --  15.1*  --  21.0*  --  16.4*  --   --   --  18.9* 16.4*  NEUTROABS 7.1  --   --   --   --   --   --   --   --   --   --   --   HGB 11.1*   < > 9.6*   < > 10.3*   < > 9.4* 10.4* 10.1* 10.3* 10.7* 9.8*  HCT 35.0*   < > 31.0*   < > 33.0*   < > 30.4* 33.9* 32.6* 33.5* 34.2* 31.9*  MCV 90.4  --  93.9  --  94.0  --  94.4  --   --   --  92.7 94.1  PLT 187  --  196  --  222  --  190  --   --   --  225 170   < > = values in this interval not displayed.    Basic Metabolic Panel: Recent Labs  Lab 01/19/18 0402 01/20/18 0103 01/21/18 0322 01/23/18 0307 01/24/18 0300  NA 139 139 138 138 138  K 4.4 4.4 4.5 4.8 4.3  CL 105 105 105 100 103  CO2 23 24 25 27 27   GLUCOSE 219* 128* 159* 217* 166*  BUN 33* 35* 36* 39* 40*  CREATININE 1.29* 1.07 1.15 1.19 1.01  CALCIUM 8.7* 8.8* 8.4* 8.8* 8.5*  MG  --   --  2.2  --   --    GFR: Estimated Creatinine Clearance: 46.4 mL/min (by C-G formula based on SCr of 1.01 mg/dL). Recent Labs  Lab 01/20/18 0103 01/21/18 0322 01/23/18 0307 01/24/18 0300  WBC 21.0* 16.4* 18.9* 16.4*    Liver Function Tests: Recent Labs  Lab 01/10/2018 1234  AST 22  ALT 16  ALKPHOS 89  BILITOT 1.4*  PROT 6.6  ALBUMIN 3.4*   No results for input(s): LIPASE, AMYLASE in the last 168 hours. No results for input(s): AMMONIA in the last 168 hours.  ABG    Component Value Date/Time   HCO3 28.1 (H) 10/30/2006 2355   TCO2 25 08/20/2016 0351     Coagulation Profile: Recent Labs  Lab 01/01/2018 1234  INR 0.89    Cardiac Enzymes: Recent Labs  Lab 01/21/2018 1234  TROPONINI <0.03    HbA1C: Hgb A1c MFr Bld  Date/Time Value Ref Range Status  11/08/2017 12:04 PM 6.7 (H) 4.8 - 5.6 % Final    Comment:             Prediabetes: 5.7 - 6.4          Diabetes: >6.4          Glycemic control for adults with diabetes: <7.0   05/02/2017 6.4 (A) 4.0 - 6.0 Final   CBG: Recent Labs  Lab 01/22/18 2120  01/23/18 0806 01/23/18 1207 01/23/18 1603 01/23/18 2115  GLUCAP 151* 158* 139* 188* 189*   Past Medical History  He,  has a past medical history of Abdominal aortic aneurysm ([place]), Allergy to alpha-gal, Anxiety, Arthritis, Cancer of skin of temple (2017), COPD with emphysema ([place]), Dyspnea, Exertional dyspnea, First degree heart block, GERD (gastroesophageal reflux disease), Headache(784.0), History of kidney stones, HOH (hard of hearing), HTN (hypertension) (08/20/2011), Hyperlipidemia, Hypertension, Ischemic colitis ([place]), Left carotid artery stenosis, Migraine, Occlusion  of right vertebral artery without cerebral infarction, PVD (peripheral vascular disease) with claudication ([place]) (cardiologist-  dr berry), Right ureteral stone, S/P arterial stent, Sleep apnea, Tubular adenoma of colon, and Urticaria.   Surgical History   Noted in records  Social History   reports that he quit smoking about 7 years ago. His smoking use included cigarettes. He has a 60.00 pack-year smoking history. He has never used smokeless tobacco. He reports that he does not drink alcohol or use drugs.   Family History   His family history includes Cancer in his father; Diabetes in his brother; Hypertension in his mother and son.   Allergies Allergies  Allergen Reactions  . Meat Extract Other (See Comments)    Alpha gal, red meat allergy   . Ativan [Lorazepam]     Hallucination   . Pneumococcal Vaccines Swelling and Other (See Comments)    Lip swelling    Rush Farmer, M.D. [facility] Pulmonary/Critical Care Medicine. Pager: [phone]. After hours pager: [phone]

## 2018-01-24 NOTE — Progress Notes (Signed)
PROGRESS NOTE    Douglas Monroe  DGL:[id number] DOB: [birth year] DOA: 01/10/2018 PCP:    Brief Narrative: 84 year old with past medical history significant for COPD, not on home oxygen, peripheral vascular disease status post iliac stenting followed by Dr. Alvester Chou, hypertension, hyperlipidemia who was evaluated by his PCP 7 days a day ago for nonproductive cough and wheezing.  Patient was treated by his PCP with antibiotics and prednisone, he presented 2 days later to the emergency department with persistent cough wheezing worsening shortness of breath.  He was found to have rectus sheath hematoma, with 2 g drop of hemoglobin since last seen in the ED.  Patient admitted with COPD exacerbation, and also found to have rectus sheath hematoma.  He was evaluated by surgery who recommended abdominal binder and blood transfusion as needed.  Hematoma appears to be stable, his hemoglobin has remained stable. Regarding his COPD exacerbation, he he has severe COPD, and he is a slowly improving.  He is still get significant shortness of breath on minimal exertion.  CCM has been helping with his care.  Chest x-ray showed bilateral atelectasis versus infiltrate.  He is on IV ceftriaxone and azithromycin.  He is getting IV Solu-Medrol and nebulizer.  Xanax to help with anxiety.  Assessment & Plan:   Principal Problem:   COPD with acute exacerbation ([place]) Active Problems:   PVD (peripheral vascular disease) with claudication, previous stents to bilateral iliacs. Rt. SFA also.    PVD S/P multiple PCIs   HTN (hypertension)   Abdominal aortic aneurysm (HCC)   S/P arterial stent, to Lt renal art. 11/02/12   Rectus sheath hematoma, initial encounter   Acute respiratory failure with hypoxia (HCC)   1-Acute COPD exacerbation, acute hypoxic respiratory failure; Continue with IV Solu-Medrol every 6 hours,  azithromycin, IV ceftriaxone.  nebulizer treatment. Respiratory failure related to COPD exacerbation and  restriction component form abdominal wall hematoma.  Robitussin schedule guaifenesin with codeine.  Episode this am, notice. Patient became more hypoxic and SOB when getting to commode. He was also anxious. He was place on NB mask. Subsequently 4 L oxygen/. Chest x ray reviewed with Dr Ander Slade, consistent with more atelectasis. Patient already on antibiotics.  -will stop anoro. Resume ipratropium, continue with albuterol. Stop Pulmicort patient report feeling worse after Pulmicort.  -Continue with Xanax to TID PRN, careful with oversedation.  -Patient FEV1 0.76 at baseline -He is now back on 2 L of oxygen, cough persist but is not worse.  He is still getting short of breath on exertion. -Dopplers negative for DVT. 01/24/18: -Per the nursing staff this morning when the patient was having his coffee he had a choking spell. -He says it happens at home as well.  Requested speech/swallow evaluation. -We will taper the Solu-Medrol.  Continue current SVNs and antibiotics.  2-Rectus sheath hematoma with acute blood loss anemia; Presumed to be after a vigorous coughing spell Surgery consulted and following. Recommending abdominal binding.  Continue to hold Plavix Patient develops worsening abdominal pain and worsening localized distension.  Surgery follow up on patient 12-20, recommending abdominal binding.  01/24/18: - Monitor hb, blood transfusion as needed.    3-Peripheral vascular disease, with claudication previous stents to bilateral iliacs. Rt. SFA also.  Stent placement 2014. Follow with Dr. Alvester Chou. Hold Plavix in the setting of acute bleed. CT reviewed by Vascular Dr Saintclair Halsted, patient to follow up with Dr Alvester Chou.   4-Abdominal aortic aneurysm ([place]) Noted for slight increase in size to 3.5 cm.  Follow  as outpatient.  5-BPH, bladder outlet obstruction on CT. Resume Flomax and finasteride.   Hypokalemia resolved.  HTN; no further low BP. Resume Norvasc.  01/14/18: -Continue to monitor  blood pressure closely and adjust medications as needed.  Hyperglycemia: In the setting of IV steroids. Continue with sliding scale insulin. 01/24/18: -Tapering prednisone.  Abdominal distension:  [place] negative for ascites.  KUB negative for obstruction.  Likely related to abdominal wall hematoma.  Continue with abdominal binder.   Leukocytosis;  Could be related to steroids.  Continue with antibiotics, ceftriaxone  and azithro   CKD stage II; monitor renal function.      Estimated body mass index is 26.87 kg/m as calculated from the following:   Height as of this encounter: 5\' 4"  (1.626 m).   Weight as of this encounter: 71 kg.   DVT prophylaxis: SCDs Code Status: Full code Family Communication: wife at bedside.  Disposition Plan: monitor Hb, respiratory status and BP.  Likely home when stable  Consultants:  None  Procedures:   None   Antimicrobials: Azithromycin, Ceftriaxone  Subjective: This morning while he was having his coffee he had episode of cough spell and worsening shortness of breath.  Remains on oxygen by nasal cannula.   Objective: Vitals:   01/24/18 0700 01/24/18 0748 01/24/18 0800 01/24/18 0820  BP: (!) 167/71  (!) 170/117 (!) 173/79  Pulse: 70  78 86  Resp: (!) 21  (!) 24 (!) 27  Temp:   98.1 F (36.7 C)   TempSrc:   Oral   SpO2: 97% 94% 95% 92%  Weight:      Height:        Intake/Output Summary (Last 24 hours) at 01/24/2018 1006 Last data filed at 01/24/2018 0800 Gross per 24 hour  Intake 0 ml  Output 1050 ml  Net -1050 ml   Filed Weights   01/18/18 1849  Weight: 71 kg    Examination:  General exam: Tachypnea Respiratory system: Bilateral wheezing, no rhonchi Cardiovascular system; S1, S2 regular rhythm and rate Gastrointestinal system: Bowel sounds present, distended, abdominal binder in place Central nervous system: Alert and oriented follows command Extremities: No Edema Skin: Large abdominal wall hematoma    Data  Reviewed: I have personally reviewed following labs and imaging studies  CBC: Recent Labs  Lab 01/27/2018 1234  01/19/18 0402  01/20/18 0103  01/21/18 0322 01/21/18 1455 01/22/18 0334 01/22/18 1932 01/23/18 0307 01/24/18 0300  WBC 9.5  --  15.1*  --  21.0*  --  16.4*  --   --   --  18.9* 16.4*  NEUTROABS 7.1  --   --   --   --   --   --   --   --   --   --   --   HGB 11.1*   < > 9.6*   < > 10.3*   < > 9.4* 10.4* 10.1* 10.3* 10.7* 9.8*  HCT 35.0*   < > 31.0*   < > 33.0*   < > 30.4* 33.9* 32.6* 33.5* 34.2* 31.9*  MCV 90.4  --  93.9  --  94.0  --  94.4  --   --   --  92.7 94.1  PLT 187  --  196  --  222  --  190  --   --   --  225 170   < > = values in this interval not displayed.   Basic Metabolic Panel: Recent Labs  Lab 01/19/18 0402  01/20/18 0103 01/21/18 0322 01/23/18 0307 01/24/18 0300  NA 139 139 138 138 138  K 4.4 4.4 4.5 4.8 4.3  CL 105 105 105 100 103  CO2 23 24 25 27 27   GLUCOSE 219* 128* 159* 217* 166*  BUN 33* 35* 36* 39* 40*  CREATININE 1.29* 1.07 1.15 1.19 1.01  CALCIUM 8.7* 8.8* 8.4* 8.8* 8.5*  MG  --   --  2.2  --   --    GFR: Estimated Creatinine Clearance: 46.4 mL/min (by C-G formula based on SCr of 1.01 mg/dL). Liver Function Tests: Recent Labs  Lab 01/16/2018 1234  AST 22  ALT 16  ALKPHOS 89  BILITOT 1.4*  PROT 6.6  ALBUMIN 3.4*   No results for input(s): LIPASE, AMYLASE in the last 168 hours. No results for input(s): AMMONIA in the last 168 hours. Coagulation Profile: Recent Labs  Lab 01/31/2018 1234  INR 0.89   Cardiac Enzymes: Recent Labs  Lab 01/06/2018 1234  TROPONINI <0.03   BNP (last 3 results) No results for input(s): PROBNP in the last 8760 hours. HbA1C: No results for input(s): HGBA1C in the last 72 hours. CBG: Recent Labs  Lab 01/22/18 2120 01/23/18 0806 01/23/18 1207 01/23/18 1603 01/23/18 2115  GLUCAP 151* 158* 139* 188* 189*   Lipid Profile: No results for input(s): CHOL, HDL, LDLCALC, TRIG, CHOLHDL, LDLDIRECT in  the last 72 hours. Thyroid Function Tests: No results for input(s): TSH, T4TOTAL, FREET4, T3FREE, THYROIDAB in the last 72 hours. Anemia Panel: No results for input(s): VITAMINB12, FOLATE, FERRITIN, TIBC, IRON, RETICCTPCT in the last 72 hours. Sepsis Labs: No results for input(s): PROCALCITON, LATICACIDVEN in the last 168 hours.  Recent Results (from the past 240 hour(s))  Respiratory Panel by PCR     Status: None   Collection Time: 01/19/18  9:21 AM  Result Value Ref Range Status   Adenovirus NOT DETECTED NOT DETECTED Final   Coronavirus 229E NOT DETECTED NOT DETECTED Final   Coronavirus HKU1 NOT DETECTED NOT DETECTED Final   Coronavirus NL63 NOT DETECTED NOT DETECTED Final   Coronavirus OC43 NOT DETECTED NOT DETECTED Final   Metapneumovirus NOT DETECTED NOT DETECTED Final   Rhinovirus / Enterovirus NOT DETECTED NOT DETECTED Final   Influenza A NOT DETECTED NOT DETECTED Final   Influenza B NOT DETECTED NOT DETECTED Final   Parainfluenza Virus 1 NOT DETECTED NOT DETECTED Final   Parainfluenza Virus 2 NOT DETECTED NOT DETECTED Final   Parainfluenza Virus 3 NOT DETECTED NOT DETECTED Final   Parainfluenza Virus 4 NOT DETECTED NOT DETECTED Final   Respiratory Syncytial Virus NOT DETECTED NOT DETECTED Final   Bordetella pertussis NOT DETECTED NOT DETECTED Final   Chlamydophila pneumoniae NOT DETECTED NOT DETECTED Final   Mycoplasma pneumoniae NOT DETECTED NOT DETECTED Final    Comment: Performed at [facility], [place]. [address], [place], [place] [address]  MRSA PCR Screening     Status: None   Collection Time: 01/20/18 11:21 AM  Result Value Ref Range Status   MRSA by PCR NEGATIVE NEGATIVE Final    Comment:        The GeneXpert MRSA Assay (FDA approved for NASAL specimens only), is one component of a comprehensive MRSA colonization surveillance program. It is not intended to diagnose MRSA infection nor to guide or monitor treatment for MRSA infections. Performed at [facility], [place] [address], [place], [place] [address]          Radiology Studies: Vas [place] Lower  Extremity Venous (dvt)  Result Date: 01/23/2018  Lower Venous Study Indications: Edema, and Tachycardia.  Limitations: Poor ultrasound/tissue interface and patient positioning. Performing Technologist: Oliver Hum RVT  Examination Guidelines: A complete evaluation includes B-mode imaging, spectral Doppler, color Doppler, and power Doppler as needed of all accessible portions of each vessel. Bilateral testing is considered an integral part of a complete examination. Limited examinations for reoccurring indications may be performed as noted.  Right Venous Findings: +---------+---------------+---------+-----------+----------+-------+          CompressibilityPhasicitySpontaneityPropertiesSummary +---------+---------------+---------+-----------+----------+-------+ CFV      Full           Yes      Yes                          +---------+---------------+---------+-----------+----------+-------+ SFJ      Full                                                 +---------+---------------+---------+-----------+----------+-------+ FV Prox  Full                                                 +---------+---------------+---------+-----------+----------+-------+ FV Mid   Full                                                 +---------+---------------+---------+-----------+----------+-------+ FV DistalFull                                                 +---------+---------------+---------+-----------+----------+-------+ PFV      Full                                                 +---------+---------------+---------+-----------+----------+-------+ POP      Full           Yes      Yes                          +---------+---------------+---------+-----------+----------+-------+ PTV      Full                                                  +---------+---------------+---------+-----------+----------+-------+ PERO     Full                                                 +---------+---------------+---------+-----------+----------+-------+  Left Venous Findings: +---------+---------------+---------+-----------+----------+-------+          CompressibilityPhasicitySpontaneityPropertiesSummary +---------+---------------+---------+-----------+----------+-------+ CFV      Full  Yes      Yes                          +---------+---------------+---------+-----------+----------+-------+ SFJ      Full                                                 +---------+---------------+---------+-----------+----------+-------+ FV Prox  Full                                                 +---------+---------------+---------+-----------+----------+-------+ FV Mid   Full                                                 +---------+---------------+---------+-----------+----------+-------+ FV DistalFull                                                 +---------+---------------+---------+-----------+----------+-------+ PFV      Full                                                 +---------+---------------+---------+-----------+----------+-------+ POP      Full           Yes      Yes                          +---------+---------------+---------+-----------+----------+-------+ PTV      Full                                                 +---------+---------------+---------+-----------+----------+-------+ PERO     Full                                                 +---------+---------------+---------+-----------+----------+-------+    Summary: Right: There is no evidence of deep vein thrombosis in the lower extremity. No cystic structure found in the popliteal fossa. Left: There is no evidence of deep vein thrombosis in the lower extremity. No cystic structure found in the popliteal fossa.  *See  table(s) above for measurements and observations. Electronically signed by Curt Jews MD on 01/23/2018 at 1:47:11 PM.    Final         Scheduled Meds: . amLODipine  5 mg Oral Daily  . atorvastatin  20 mg Oral Daily  . azithromycin  500 mg Oral Daily  . benzonatate  200 mg Oral TID  . chlorpheniramine-HYDROcodone  5 mL Oral Q12H  . cholecalciferol  2,000 Units Oral Daily  . docusate sodium  100 mg Oral BID  .  famotidine  20 mg Oral Daily  . finasteride  5 mg Oral Daily  . guaiFENesin-codeine  5 mL Oral QID  . insulin aspart  0-9 Units Subcutaneous TID WC  . ipratropium-albuterol  3 mL Nebulization QID  . loratadine  10 mg Oral Daily  . LORazepam  0.5 mg Intravenous Once  . magnesium oxide  400 mg Oral Daily  . mouth rinse  15 mL Mouth Rinse BID  . methylPREDNISolone (SOLU-MEDROL) injection  60 mg Intravenous Q8H  . nebivolol  10 mg Oral Daily  . pantoprazole  40 mg Oral BID AC  . polyethylene glycol  17 g Oral BID  . senna  1 tablet Oral Daily  . tamsulosin  0.4 mg Oral Daily   Continuous Infusions: . cefTRIAXone (ROCEPHIN)  IV 1 g (01/23/18 1413)     LOS: 7 days    Time spent: 35 minutes     Yaakov Guthrie, MD [facility] Pager on [place]  If 7PM-7AM, please contact night-coverage [email or web address] Password [email or web address] 01/24/2018, 10:06 AM

## 2018-01-24 NOTE — Progress Notes (Signed)
  Speech Language Pathology Treatment: Dysphagia  Patient Details Name: Douglas Monroe MRN: [id number] DOB: 12-May-1932 Today's Date: 01/24/2018 Time: 7588-3254 SLP Time Calculation (min) (ACUTE ONLY): 25 min  Assessment / Plan / Recommendation Clinical Impression  Educated pt, spouse and their daughter to MBS findings and clinical reasoning for compensation strategies to mitigate aspiration.  Reviewed flouro loops with pt/family and provided written instructions for compensations using teach back. Keeping voice/"cough" and hock strong recommended to help maximize airway protection.  Advised SLP recommendation for MD to consider ordering a dedicated esophagram during hospital coarse, all questions answered. Will follow up for dysphagia management.     HPI HPI: 84 yo male adm to [facility] 01/05/2018 with breathing difficulties.  Pt PMH + for COPD, PVD, AAA, GERD for which he takes a PPI and ranitidine.  Pt noted to become "choked" with liquids during meal this week and swallow eval ordered.  CXR initially negative for acute findings but recent image concerning for right upper lobe pna- 01/22/18.  MBS indicated due to concerns for dysphagia/aspiration.        SLP Plan  Continue with current plan of care       Recommendations  Diet recommendations: Regular;Thin liquid Liquids provided via: Straw Medication Administration: Other (Comment)(as tolerated) Supervision: Patient able to self feed Compensations: Small sips/bites;Slow rate;Multiple dry swallows after each bite/sip;Chin tuck;Other (Comment)(intermittent dry swallow) Postural Changes and/or Swallow Maneuvers: Seated upright 90 degrees;Upright 30-60 min after meal                Oral Care Recommendations: Oral care BID Follow up Recommendations: Other (comment)(tbd) SLP Visit Diagnosis: Dysphagia, unspecified (R13.10);Dysphagia, oropharyngeal phase (R13.12) Plan: Continue with current plan of care       GO                 Macario Golds 01/24/2018, 4:39 PM  Luanna Salk, [facility] SLP Acute Rehab Services Pager [phone] Office [phone]

## 2018-01-24 NOTE — Progress Notes (Signed)
SLP Cancellation Note  Patient Details Name: BAPTISTE LITTLER MRN: [id number] DOB: 17-Sep-1932   Cancelled treatment:         SLP will plan MBS given pt coughing with liquids today and over the weekend.  Spoke to [employer] and xray able to accommodate pt at 1330 today.   Luanna Salk, MS [facility] SLP Acute Rehab Services Pager [phone] Office [phone]    Macario Golds 01/24/2018, 12:52 PM

## 2018-01-25 LAB — CBC
HCT: 32.7 % — ABNORMAL LOW (ref 39.0–52.0)
Hemoglobin: 10.2 g/dL — ABNORMAL LOW (ref 13.0–17.0)
MCH: 29.5 pg (ref 26.0–34.0)
MCHC: 31.2 g/dL (ref 30.0–36.0)
MCV: 94.5 fL (ref 80.0–100.0)
Platelets: 166 10*3/uL (ref 150–400)
RBC: 3.46 MIL/uL — ABNORMAL LOW (ref 4.22–5.81)
RDW: 16.2 % — ABNORMAL HIGH (ref 11.5–15.5)
WBC: 14.7 10*3/uL — ABNORMAL HIGH (ref 4.0–10.5)
nRBC: 0 % (ref 0.0–0.2)

## 2018-01-25 LAB — GLUCOSE, CAPILLARY
Glucose-Capillary: 196 mg/dL — ABNORMAL HIGH (ref 70–99)
Glucose-Capillary: 179 mg/dL — ABNORMAL HIGH (ref 70–99)
Glucose-Capillary: 146 mg/dL — ABNORMAL HIGH (ref 70–99)
Glucose-Capillary: 276 mg/dL — ABNORMAL HIGH (ref 70–99)

## 2018-01-25 LAB — BASIC METABOLIC PANEL
Anion gap: 9 (ref 5–15)
BUN: 37 mg/dL — ABNORMAL HIGH (ref 8–23)
CO2: 27 mmol/L (ref 22–32)
Calcium: 8.4 mg/dL — ABNORMAL LOW (ref 8.9–10.3)
Chloride: 104 mmol/L (ref 98–111)
Creatinine, Ser: 1.03 mg/dL (ref 0.61–1.24)
GFR calc Af Amer: >60 mL/min (ref >60)
GFR calc non Af Amer: >60 mL/min (ref >60)
GLUCOSE: 176 mg/dL — AB (ref 70–99)
Potassium: 4.4 mmol/L (ref 3.5–5.1)
Sodium: 140 mmol/L (ref 135–145)

## 2018-01-25 MED ORDER — SENNOSIDES-DOCUSATE SODIUM 8.6-50 MG PO TABS
1.0000 | ORAL_TABLET | Freq: Two times a day (BID) | ORAL | Status: DC
  Administered 2018-01-25 – 2018-02-01 (×12): 1 via ORAL
  Filled 2018-01-25 (×15): qty 1

## 2018-01-25 MED ORDER — GUAIFENESIN ER 600 MG PO TB12
600.0000 mg | ORAL_TABLET | Freq: Two times a day (BID) | ORAL | Status: DC
  Administered 2018-01-25 – 2018-02-01 (×15): 600 mg via ORAL
  Filled 2018-01-25 (×15): qty 1

## 2018-01-25 MED ORDER — POLYETHYLENE GLYCOL 3350 17 G PO PACK
17.0000 g | PACK | Freq: Every day | ORAL | Status: DC
  Administered 2018-01-26: 17 g via ORAL
  Filled 2018-01-25: qty 1

## 2018-01-25 MED ORDER — BISACODYL 10 MG RE SUPP: 10.0000 mg | Freq: Every day | RECTAL | Status: DC | PRN

## 2018-01-25 MED ORDER — METHOCARBAMOL 500 MG PO TABS
500.0000 mg | ORAL_TABLET | Freq: Three times a day (TID) | ORAL | Status: DC
  Administered 2018-01-25 – 2018-02-01 (×22): 500 mg via ORAL
  Filled 2018-01-25 (×21): qty 1

## 2018-01-25 NOTE — Progress Notes (Signed)
PROGRESS NOTE    Douglas Monroe  IWL:[id number] DOB: 1932-11-24 DOA: 01/26/2018 PCP:    Brief Narrative: 84 year old with past medical history significant for COPD, not on home oxygen, peripheral vascular disease status post iliac stenting followed by Dr. Alvester Chou, hypertension, hyperlipidemia who was evaluated by his PCP 7 days a day ago for nonproductive cough and wheezing.  Patient was treated by his PCP with antibiotics and prednisone, he presented 2 days later to the emergency department with persistent cough wheezing worsening shortness of breath.  He was found to have rectus sheath hematoma, with 2 g drop of hemoglobin since last seen in the ED.  Patient admitted with COPD exacerbation, and also found to have rectus sheath hematoma.  He was evaluated by surgery who recommended abdominal binder and blood transfusion as needed.  Hematoma appears to be stable, his hemoglobin has remained stable. Regarding his COPD exacerbation, he he has severe COPD, and he is a slowly improving.  He is still get significant shortness of breath on minimal exertion.  CCM has been helping with his care.  Chest x-ray showed bilateral atelectasis versus infiltrate.  He is on IV ceftriaxone and azithromycin.  He is getting IV Solu-Medrol and nebulizer.  Xanax to help with anxiety.  Assessment & Plan:   Principal Problem:   COPD with acute exacerbation ([place]) Active Problems:   PVD (peripheral vascular disease) with claudication, previous stents to bilateral iliacs. Rt. SFA also.    PVD S/P multiple PCIs   HTN (hypertension)   Abdominal aortic aneurysm (HCC)   S/P arterial stent, to Lt renal art. 11/02/12   Rectus sheath hematoma, initial encounter   Acute respiratory failure with hypoxia (HCC)   1-Acute COPD exacerbation, acute hypoxic respiratory failure; Continue with IV Solu-Medrol every 6 hours,  azithromycin, IV ceftriaxone.  nebulizer treatment. Respiratory failure related to COPD exacerbation and  restriction component form abdominal wall hematoma.  mucinex BID added schedule guaifenesin with codeine.  -on DUONEB, flonase, will stop anoro, PRN albuterol.  -Continue with Xanax to TID PRN, careful with oversedation.  -speech/swallow evaluation. -We will taper the Solu-Medrol. -Monitor in stepdown today  2-Rectus sheath hematoma with acute blood loss anemia; Presumed to be after a vigorous coughing spell Surgery consulted. Recommending abdominal binding.  Continue to hold Plavix. Monitor hb, blood transfusion as needed.   3-Peripheral vascular disease, with claudication previous stents to bilateral iliacs. Rt. SFA also.  Stent placement 2014. Follow with Dr. Alvester Chou. Hold Plavix in the setting of acute bleed. CT reviewed by Vascular, patient to follow up with Dr Alvester Chou.   4-Abdominal aortic aneurysm ([place]) Noted for slight increase in size to 3.5 cm.  Follow as outpatient.  5-BPH, bladder outlet obstruction on CT. Resume Flomax and finasteride.   Hypokalemia resolved.  HTN; no further low BP.  Hold Norvasc.   Hyperglycemia: In the setting of IV steroids. Continue with sliding scale insulin.  Abdominal distension:  [place] negative for ascites.  KUB negative for obstruction.  Likely related to abdominal wall hematoma.  Continue with abdominal binder.   Leukocytosis;  Could be related to steroids.  Continue with antibiotics, ceftriaxone  and azithro   CKD stage II; monitor renal function.  DVT prophylaxis: SCDs Code Status: Full code Family Communication: wife at bedside.  Disposition Plan: monitor Hb, respiratory status and BP.   Consultants:  None  Procedures:   None   Antimicrobials: Azithromycin, Ceftriaxone  Subjective: Feeling still shortnes of breath, abdominal distension is still present. No fever or  chills, no BM.   Objective: Vitals:   01/25/18 0751 01/25/18 0800 01/25/18 1200 01/25/18 1415  BP:  (!) 159/107 (!) 158/86 114/68  Pulse: 83 92 95  81  Resp: 17 (!) 27 (!) 25 20  Temp: 97.9 F (36.6 C)  97.8 F (36.6 C)   TempSrc: Oral  Axillary   SpO2: 97% 97% 90% 97%  Weight:      Height:        Intake/Output Summary (Last 24 hours) at 01/25/2018 1440 Last data filed at 01/25/2018 1202 Gross per 24 hour  Intake 313.59 ml  Output 1000 ml  Net -686.41 ml   Filed Weights   01/18/18 1849  Weight: 71 kg    Examination:  General: Alert, Awake and Oriented to Time, Place and Person. Appear in moderate distress, affect appropriate Eyes: PERRL, Conjunctiva normal ENT: Oral Mucosa clear moist. Neck: difficult to assess  JVD, no Abnormal Mass Or lumps Cardiovascular: S1 and S2 Present, no Murmur, Peripheral Pulses Present Respiratory: increased respiratory effort, Bilateral Air entry equal and Decreased, no use of accessory muscle, bilateral basal Crackles, no wheezes Abdomen: Bowel Sound present, Soft and distended with mild tenderness, no hernia Skin: no redness, no Rash, no induration Extremities: no Pedal edema, no calf tenderness Neurologic: Grossly no focal neuro deficit. Bilaterally Equal motor strength  Data Reviewed: I have personally reviewed following labs and imaging studies  CBC: Recent Labs  Lab 01/20/18 0103  01/21/18 0322  01/22/18 0334 01/22/18 1932 01/23/18 0307 01/24/18 0300 01/25/18 0302  WBC 21.0*  --  16.4*  --   --   --  18.9* 16.4* 14.7*  HGB 10.3*   < > 9.4*   < > 10.1* 10.3* 10.7* 9.8* 10.2*  HCT 33.0*   < > 30.4*   < > 32.6* 33.5* 34.2* 31.9* 32.7*  MCV 94.0  --  94.4  --   --   --  92.7 94.1 94.5  PLT 222  --  190  --   --   --  225 170 166   < > = values in this interval not displayed.   Basic Metabolic Panel: Recent Labs  Lab 01/20/18 0103 01/21/18 0322 01/23/18 0307 01/24/18 0300 01/25/18 0302  NA 139 138 138 138 140  K 4.4 4.5 4.8 4.3 4.4  CL 105 105 100 103 104  CO2 24 25 27 27 27   GLUCOSE 128* 159* 217* 166* 176*  BUN 35* 36* 39* 40* 37*  CREATININE 1.07 1.15 1.19 1.01  1.03  CALCIUM 8.8* 8.4* 8.8* 8.5* 8.4*  MG  --  2.2  --   --   --    GFR: Estimated Creatinine Clearance: 45.5 mL/min (by C-G formula based on SCr of 1.03 mg/dL). Liver Function Tests: No results for input(s): AST, ALT, ALKPHOS, BILITOT, PROT, ALBUMIN in the last 168 hours. No results for input(s): LIPASE, AMYLASE in the last 168 hours. No results for input(s): AMMONIA in the last 168 hours. Coagulation Profile: No results for input(s): INR, PROTIME in the last 168 hours. Cardiac Enzymes: No results for input(s): CKTOTAL, CKMB, CKMBINDEX, TROPONINI in the last 168 hours. BNP (last 3 results) No results for input(s): PROBNP in the last 8760 hours. HbA1C: No results for input(s): HGBA1C in the last 72 hours. CBG: Recent Labs  Lab 01/24/18 1128 01/24/18 1635 01/24/18 2120 01/25/18 0735 01/25/18 1241  GLUCAP 170* 142* 183* 196* 179*   Lipid Profile: No results for input(s): CHOL, HDL, LDLCALC, TRIG, CHOLHDL, LDLDIRECT  in the last 72 hours. Thyroid Function Tests: No results for input(s): TSH, T4TOTAL, FREET4, T3FREE, THYROIDAB in the last 72 hours. Anemia Panel: No results for input(s): VITAMINB12, FOLATE, FERRITIN, TIBC, IRON, RETICCTPCT in the last 72 hours. Sepsis Labs: No results for input(s): PROCALCITON, LATICACIDVEN in the last 168 hours.  Recent Results (from the past 240 hour(s))  Respiratory Panel by PCR     Status: None   Collection Time: 01/19/18  9:21 AM  Result Value Ref Range Status   Adenovirus NOT DETECTED NOT DETECTED Final   Coronavirus 229E NOT DETECTED NOT DETECTED Final   Coronavirus HKU1 NOT DETECTED NOT DETECTED Final   Coronavirus NL63 NOT DETECTED NOT DETECTED Final   Coronavirus OC43 NOT DETECTED NOT DETECTED Final   Metapneumovirus NOT DETECTED NOT DETECTED Final   Rhinovirus / Enterovirus NOT DETECTED NOT DETECTED Final   Influenza A NOT DETECTED NOT DETECTED Final   Influenza B NOT DETECTED NOT DETECTED Final   Parainfluenza Virus 1 NOT  DETECTED NOT DETECTED Final   Parainfluenza Virus 2 NOT DETECTED NOT DETECTED Final   Parainfluenza Virus 3 NOT DETECTED NOT DETECTED Final   Parainfluenza Virus 4 NOT DETECTED NOT DETECTED Final   Respiratory Syncytial Virus NOT DETECTED NOT DETECTED Final   Bordetella pertussis NOT DETECTED NOT DETECTED Final   Chlamydophila pneumoniae NOT DETECTED NOT DETECTED Final   Mycoplasma pneumoniae NOT DETECTED NOT DETECTED Final    Comment: Performed at [facility], [place]. [address], [place], [place] [address]  MRSA PCR Screening     Status: None   Collection Time: 01/20/18 11:21 AM  Result Value Ref Range Status   MRSA by PCR NEGATIVE NEGATIVE Final    Comment:        The GeneXpert MRSA Assay (FDA approved for NASAL specimens only), is one component of a comprehensive MRSA colonization surveillance program. It is not intended to diagnose MRSA infection nor to guide or monitor treatment for MRSA infections. Performed at [facility], [place] [address] [place], [place] [address]          Radiology Studies: Dg Swallowing Func-speech Pathology  Result Date: 01/24/2018 Objective Swallowing Evaluation: Type of Study: MBS-Modified Barium Swallow Study  Patient Details Name: Douglas Monroe MRN: [id number] Date of Birth: 20-Sep-1932 Today's Date: 01/24/2018 Time: SLP Start Time (ACUTE ONLY): 1330 -SLP Stop Time (ACUTE ONLY): 1405 SLP Time Calculation (min) (ACUTE ONLY): 35 min Past Medical History: Past Medical History: Diagnosis Date . Abdominal aortic aneurysm ([place])   infrarenal -- monitored by dr berry (note states stable 4.0 to 4.1cm) . Allergy to alpha-gal   per aptient and wife; reports he was diagnosed in spring 2019 after a bite form possibly a chigger or tick ;   . Anxiety   situational . Arthritis   "hands" (03/15/2016) . Cancer of skin of temple 2017  right . COPD with emphysema ([place])  . Dyspnea   with exertion . Exertional dyspnea  . First degree heart block  .  GERD (gastroesophageal reflux disease)  . Headache(784.0)   uses goody powder; "maybe weekly, maybe not that much" (03/15/2016) . History of kidney stones  . HOH (hard of hearing)   left ear; no hearing aid  . HTN (hypertension) 08/20/2011 . Hyperlipidemia  . Hypertension  . Ischemic colitis ([place])  . Left carotid artery stenosis   moderate . Migraine   "long time ago" (03/15/2016) . Occlusion of right vertebral artery without cerebral infarction  . PVD (peripheral  vascular disease) with claudication [facility]) cardiologist-  dr berry  s/p bilateral iliac stents and right sfa stenting/  doppper study 09-05-2012  revealed ABIs close to one bilaterally with patent stents/  stenting left renal artery stenosis  . Right ureteral stone  . S/P arterial stent   left renal angioplasty and stent 11-02-2012 . Sleep apnea   at risk for OSA. stop/bang score= 6; sent to PCP 10/04/13; denies use of mask on 03/15/2016 . Tubular adenoma of colon  . Urticaria  Past Surgical History: Past Surgical History: Procedure Laterality Date . ABDOMINAL ANGIOGRAM  08/19/2011  Left common iliac artery at the ostium, 6x64mm ICAST covered stent, common femoral artery, 8x160mm Abbott absolute pro nitinol self-expanding stent and resulting in reduction of 80 and 90% stenosis to 0% residual . ABDOMINAL ANGIOGRAM  10/08/2010  Mid-right SFA, 6x120 EV3 Protege nitinol self-expanding stent, resulting in reduction of CTO to 0% residual . ABDOMINAL ANGIOGRAM  06/22/2010  Rt Common Femoral Artery-8x3 Absolute Pro Nitinol self-expanding stent-90% stenosis to 0% residual; Rt External Iliac Artery-9x3 Absolute Pro Nitinol self-expanding stent-70% stenosis to 0% residual; Left External Iliac Artery-8x4 Absolute Pro Nitinol self-expanding stent-90% to 0% residual . ATHERECTOMY N/A 08/19/2011  Procedure: ATHERECTOMY;  Surgeon: Lorretta Harp, MD;  Location: [facility] CATH LAB;  Service: Cardiovascular;  Laterality: N/A; . BACK SURGERY   . CARDIAC CATHETERIZATION  10/26/2002  dr Tami Ribas   normal coronary arteries/ normal  LVF . CARDIOVASCULAR STRESS TEST  06-12-2010   dr berry  normal perfusion study/ no ischemia/  ef 69% . COLONOSCOPY WITH PROPOFOL N/A 03/19/2016  Procedure: COLONOSCOPY WITH PROPOFOL;  Surgeon: Mauri Pole, MD;  Location: [facility];  Service: Endoscopy;  Laterality: N/A;  was inpatient, but got d/c'd home and is coming back as an OP . CYSTOSCOPY W/ URETERAL STENT PLACEMENT Left 06/07/2016  Procedure: CYSTOSCOPY WITH RETROGRADE PYELOGRAM/URETERAL STENT REPLACEMENT;  Surgeon: Cleon Gustin, MD;  Location: [facility];  Service: Urology;  Laterality: Left; . CYSTOSCOPY W/ URETERAL STENT PLACEMENT Left 09/20/2016  Procedure: CYSTOSCOPY WITH RETROGRADE PYELOGRAM/URETERAL STENT PLACEMENT;  Surgeon: Cleon Gustin, MD;  Location: [facility];  Service: Urology;  Laterality: Left; . CYSTOSCOPY WITH RETROGRADE PYELOGRAM, URETEROSCOPY AND STENT PLACEMENT Right 10/10/2013  Procedure: uretheral dilation, CYSTOSCOPY, URETEROSCOPY, stone extraction, STENT PLACEMENT;  Surgeon: [name], MD;  Location: [facility];  Service: Urology;  Laterality: Right; . CYSTOSCOPY WITH RETROGRADE PYELOGRAM, URETEROSCOPY AND STENT PLACEMENT Left 02/08/2017  Procedure: CYSTOSCOPY WITH RETROGRADE PYELOGRAM, URETEROSCOPY;  Surgeon: Cleon Gustin, MD;  Location: [facility];  Service: Urology;  Laterality: Left; . CYSTOSCOPY WITH RETROGRADE PYELOGRAM, URETEROSCOPY AND STENT PLACEMENT Left 05/16/2017  Procedure: CYSTOSCOPY WITH RETROGRADE PYELOGRAM, URETEROSCOPY WITH ABLATION OF URETERAL TUMOR, BLADDER BIOPSY AND FULGURATION, AND STENT PLACEMENT;  Surgeon: Cleon Gustin, MD;  Location: [facility];  Service: Urology;  Laterality: Left; . CYSTOSCOPY WITH RETROGRADE PYELOGRAM, URETEROSCOPY AND STENT PLACEMENT Left 06/13/2017  Procedure: CYSTOSCOPY WITH RETROGRADE PYELOGRAM, URETEROSCOPY STENT EXCHANGE;  Surgeon: Cleon Gustin, MD;  Location: [facility];  Service: Urology;  Laterality: Left; .  CYSTOSCOPY WITH RETROGRADE PYELOGRAM, URETEROSCOPY AND STENT PLACEMENT Left 10/31/2017  Procedure: CYSTOSCOPY WITH RETROGRADE PYELOGRAM, URETEROSCOPY ,FULGURATION OF URETERAL TUMOR AND STENT PLACEMENT;  Surgeon: Cleon Gustin, MD;  Location: [facility];  Service: Urology;  Laterality: Left;  1 HR POSSIBLE STENT PLACEMENT, ALSO POSSIBLE TUMOR ABLATION . CYSTOSCOPY WITH URETEROSCOPY Left 05/09/2016  Procedure: CYSTOSCOPY, RETROGRADE, LEFT URETEROSCOPY, LEFT URETERAL BIOPSIES, HOLMIUM LASER FULGERATION, LEFT URETERAL STENT PLACEMENT;  Surgeon: Irine Seal, MD;  Location: [facility];  Service: Urology;  Laterality: Left; . CYSTOSCOPY/URETEROSCOPY/HOLMIUM LASER Left 09/20/2016  Procedure: CYSTOSCOPY/URETEROSCOPY/HOLMIUM LASER;  Surgeon: Cleon Gustin, MD;  Location: [facility];  Service: Urology;  Laterality: Left; . HOLMIUM LASER APPLICATION Right 4/0/1027  Procedure: HOLMIUM LASER APPLICATION;  Surgeon: [name], MD;  Location: [facility];  Service: Urology;  Laterality: Right; . HOLMIUM LASER APPLICATION Left 03/08/3662  Procedure: TUMOR ABLATION WITH HOLMIUM LASER;  Surgeon: Cleon Gustin, MD;  Location[facility];  Service: Urology;  Laterality: Left; . LUMBAR DISC SURGERY  1970's . RENAL ANGIOGRAM N/A 11/02/2012  Procedure: RENAL ANGIOGRAM;  Surgeon: Lorretta Harp, MD;  Location: [facility] CATH LAB;  Service: Cardiovascular;  Laterality: N/A; . RENAL ARTERY STENT Left 11/02/2012 . SKIN CANCER EXCISION Right 2017  temple . SOFT TISSUE CYST EXCISION  1960's  "outside part of lung LLL" . TRANSURETHRAL RESECTION OF BLADDER TUMOR N/A 06/13/2017  Procedure: TRANSURETHRAL RESECTION OF BLADDER TUMOR (TURBT);  Surgeon: Cleon Gustin, MD;  Location[facility];  Service: Urology;  Laterality: N/A; HPI: 84 yo male adm to [facility] 01/21/2018 with breathing difficulties.  Pt PMH + for COPD, PVD, AAA, GERD for which he takes a PPI and ranitidine.  Pt noted to become "choked" with liquids during meal this week and swallow  eval ordered.  CXR initially negative for acute findings but recent image concerning for right upper lobe pna- 01/22/18.  MBS indicated due to concerns for dysphagia/aspiration.   Subjective: pt awake in chair Assessment / Plan / Recommendation CHL IP CLINICAL IMPRESSIONS 01/24/2018 Clinical Impression Pt presents with mild oropharyngeal dysphagia without aspiration of any consistency tested.  He did mildly penetrate with thin liquids above vocal cords before the swallow with tsp and during with cup/straw.  Chin tuck posture prevented penetration - but uncertain if pt can conduct consistently.  Mild pharyngeal residuals (with liquids more than solids) present without pt consistent sensation.  Cued dry and reflexive swallows decrease residuals.  Barium tablet taken with thin appeared to lodge at distal esophagus without pt awareness.  Appearance of backflow of liquids - suspect pt's "choking" may be due to his esophageal issues.  Pt was noted to become dyspneic and cough during the MBS but no aspiration observed.  Using live video monitoring, pt educated to findings/recommendations. SLP Visit Diagnosis Dysphagia, oropharyngeal phase (R13.12) Attention and concentration deficit following -- Frontal lobe and executive function deficit following -- Impact on safety and function Moderate aspiration risk   CHL IP TREATMENT RECOMMENDATION 01/24/2018 Treatment Recommendations Therapy as outlined in treatment plan below   Prognosis 01/24/2018 Prognosis for Safe Diet Advancement Fair Barriers to Reach Goals Time post onset Barriers/Prognosis Comment -- CHL IP DIET RECOMMENDATION 01/24/2018 SLP Diet Recommendations Regular solids;Thin liquid Liquid Administration via Cup;Straw Medication Administration Other (Comment) Compensations Slow rate;Small sips/bites Postural Changes Remain semi-upright after after feeds/meals (Comment);Seated upright at 90 degrees   CHL IP OTHER RECOMMENDATIONS 01/24/2018 Recommended Consults -- Oral  Care Recommendations Oral care BID Other Recommendations --   CHL IP FOLLOW UP RECOMMENDATIONS 01/24/2018 Follow up Recommendations None   CHL IP FREQUENCY AND DURATION 01/24/2018 Speech Therapy Frequency (ACUTE ONLY) min 1 x/week Treatment Duration 1 week      CHL IP ORAL PHASE 01/24/2018 Oral Phase Impaired Oral - Pudding Teaspoon -- Oral - Pudding Cup -- Oral - Honey Teaspoon -- Oral - Honey Cup -- Oral - Nectar Teaspoon -- Oral - Nectar Cup WFL;Lingual pumping;Holding of bolus  Oral - Nectar Straw -- Oral - Thin Teaspoon WFL Oral - Thin Cup WFL Oral - Thin Straw WFL Oral - Puree WFL;Lingual pumping;Weak lingual manipulation Oral - Mech Soft NT Oral - Regular WFL Oral - Multi-Consistency -- Oral - Pill WFL Oral Phase - Comment --  CHL IP PHARYNGEAL PHASE 01/24/2018 Pharyngeal Phase Impaired Pharyngeal- Pudding Teaspoon -- Pharyngeal -- Pharyngeal- Pudding Cup -- Pharyngeal -- Pharyngeal- Honey Teaspoon -- Pharyngeal -- Pharyngeal- Honey Cup -- Pharyngeal -- Pharyngeal- Nectar Teaspoon -- Pharyngeal -- Pharyngeal- Nectar Cup WFL Pharyngeal -- Pharyngeal- Nectar Straw -- Pharyngeal -- Pharyngeal- Thin Teaspoon Penetration/Aspiration during swallow;Reduced laryngeal elevation;Reduced airway/laryngeal closure;Reduced epiglottic inversion Pharyngeal Material enters airway, remains ABOVE vocal cords and not ejected out Pharyngeal- Thin Cup Reduced laryngeal elevation;Reduced airway/laryngeal closure;Penetration/Aspiration during swallow;Reduced tongue base retraction;Reduced epiglottic inversion Pharyngeal Material enters airway, remains ABOVE vocal cords and not ejected out Pharyngeal- Thin Straw Reduced laryngeal elevation;Reduced airway/laryngeal closure;Penetration/Aspiration during swallow;Reduced tongue base retraction;Reduced epiglottic inversion;Pharyngeal residue - pyriform;Pharyngeal residue - valleculae Pharyngeal Material enters airway, remains ABOVE vocal cords and not ejected out Pharyngeal- Puree WFL  Pharyngeal -- Pharyngeal- Mechanical Soft -- Pharyngeal -- Pharyngeal- Regular WFL Pharyngeal -- Pharyngeal- Multi-consistency -- Pharyngeal -- Pharyngeal- Pill WFL Pharyngeal -- Pharyngeal Comment mild residuals in pharynx with liquids more than solids at vallecular and pyriform sinus region, pt did not sense residuals consistently but dry swallows help to decrease residuals, chin tuck posture did not result in laryngeal penetration but uncertain if pt able to consistently perform, trace laryngeal penetration only, pt did not aspirate despite coughing during mbs   CHL IP CERVICAL ESOPHAGEAL PHASE 01/24/2018 Cervical Esophageal Phase Impaired Pudding Teaspoon -- Pudding Cup -- Honey Teaspoon -- Honey Cup -- Nectar Teaspoon -- Nectar Cup -- Nectar Straw -- Thin Teaspoon -- Thin Cup -- Thin Straw -- Puree -- Mechanical Soft -- Regular -- Multi-consistency -- Pill -- Cervical Esophageal Comment upon esophageal sweep, barium tablet lodged at distal esophagus WITHOUT pt sensation, pt also appeared with backflow of barium without sensation, barium tablet did not clear despite further intake of puree and water, pt reports he is on a PPI but admits to having some issues with refluxing in the hospital and states this is worse than his infrequent choking on liquids Luanna Salk, MS [facility] SLP Acute Rehab Services Pager [phone] Office [phone] Macario Golds 01/24/2018, 3:21 PM                   Scheduled Meds: . atorvastatin  20 mg Oral Daily  . azithromycin  500 mg Oral Daily  . benzonatate  200 mg Oral TID  . chlorpheniramine-HYDROcodone  5 mL Oral Q12H  . cholecalciferol  2,000 Units Oral Daily  . famotidine  20 mg Oral Daily  . finasteride  5 mg Oral Daily  . fluticasone  1 spray Each Nare Daily  . guaiFENesin  600 mg Oral BID  . insulin aspart  0-9 Units Subcutaneous TID WC  . ipratropium-albuterol  3 mL Nebulization QID  . loratadine  10 mg Oral Daily  . magnesium oxide  400 mg Oral  Daily  . mouth rinse  15 mL Mouth Rinse BID  . methocarbamol  500 mg Oral TID  . methylPREDNISolone (SOLU-MEDROL) injection  40 mg Intravenous Q8H  . nebivolol  10 mg Oral Daily  . pantoprazole  40 mg Oral BID AC  . polyethylene glycol  17 g Oral Daily  . senna-docusate  1 tablet Oral BID  . tamsulosin  0.4 mg Oral Daily   Continuous Infusions: . cefTRIAXone (ROCEPHIN)  IV 1 g (01/25/18 1431)     LOS: 8 days    Time spent: 35 minutes   Author:  Berle Mull, MD [facility] Pager: [phone] 01/25/2018 2:49 PM     If 7PM-7AM, please contact night-coverage [email or web address] Password [email or web address] 01/25/2018, 2:40 PM

## 2018-01-26 ENCOUNTER — Inpatient Hospital Stay (HOSPITAL_COMMUNITY): Payer: Medicare Other

## 2018-01-26 ENCOUNTER — Inpatient Hospital Stay (HOSPITAL_BASED_OUTPATIENT_CLINIC_OR_DEPARTMENT_OTHER): Payer: Medicare Other

## 2018-01-26 DIAGNOSIS — R609 Edema, unspecified: Secondary | ICD-10-CM

## 2018-01-26 LAB — DIFFERENTIAL
Abs Immature Granulocytes: 0.13 10*3/uL — ABNORMAL HIGH (ref 0.00–0.07)
Basophils Absolute: 0 10*3/uL (ref 0.0–0.1)
Basophils Relative: 0 %
EOS PCT: 0 %
Eosinophils Absolute: 0 10*3/uL (ref 0.0–0.5)
Immature Granulocytes: 1 %
Lymphocytes Relative: 2 %
Lymphs Abs: 0.3 10*3/uL — ABNORMAL LOW (ref 0.7–4.0)
Monocytes Absolute: 0.6 10*3/uL (ref 0.1–1.0)
Monocytes Relative: 4 %
Neutro Abs: 12.2 10*3/uL — ABNORMAL HIGH (ref 1.7–7.7)
Neutrophils Relative %: 93 %

## 2018-01-26 LAB — CBC
HEMATOCRIT: 32.8 % — AB (ref 39.0–52.0)
Hemoglobin: 10.2 g/dL — ABNORMAL LOW (ref 13.0–17.0)
MCH: 28.8 pg (ref 26.0–34.0)
MCHC: 31.1 g/dL (ref 30.0–36.0)
MCV: 92.7 fL (ref 80.0–100.0)
Platelets: 168 10*3/uL (ref 150–400)
RBC: 3.54 MIL/uL — ABNORMAL LOW (ref 4.22–5.81)
RDW: 15.9 % — ABNORMAL HIGH (ref 11.5–15.5)
WBC: 13.3 10*3/uL — ABNORMAL HIGH (ref 4.0–10.5)
nRBC: 0 % (ref 0.0–0.2)

## 2018-01-26 LAB — BASIC METABOLIC PANEL
Anion gap: 8 (ref 5–15)
BUN: 35 mg/dL — AB (ref 8–23)
CO2: 28 mmol/L (ref 22–32)
Calcium: 8.5 mg/dL — ABNORMAL LOW (ref 8.9–10.3)
Chloride: 105 mmol/L (ref 98–111)
Creatinine, Ser: 1.02 mg/dL (ref 0.61–1.24)
GFR calc Af Amer: >60 mL/min (ref >60)
GFR calc non Af Amer: >60 mL/min (ref >60)
Glucose, Bld: 179 mg/dL — ABNORMAL HIGH (ref 70–99)
Potassium: 4.3 mmol/L (ref 3.5–5.1)
Sodium: 141 mmol/L (ref 135–145)

## 2018-01-26 LAB — GLUCOSE, CAPILLARY
GLUCOSE-CAPILLARY: 245 mg/dL — AB (ref 70–99)
Glucose-Capillary: 133 mg/dL — ABNORMAL HIGH (ref 70–99)
Glucose-Capillary: 130 mg/dL — ABNORMAL HIGH (ref 70–99)
Glucose-Capillary: 105 mg/dL — ABNORMAL HIGH (ref 70–99)

## 2018-01-26 MED ORDER — AMLODIPINE BESYLATE 5 MG PO TABS
5.0000 mg | ORAL_TABLET | Freq: Every day | ORAL | Status: DC
  Administered 2018-01-26 – 2018-02-01 (×7): 5 mg via ORAL
  Filled 2018-01-26 (×7): qty 1

## 2018-01-26 MED ORDER — SALINE SPRAY 0.65 % NA SOLN
1.0000 | NASAL | Status: DC | PRN
  Filled 2018-01-26: qty 44

## 2018-01-26 MED ORDER — POLYETHYLENE GLYCOL 3350 17 G PO PACK: 17.0000 g | PACK | Freq: Every day | ORAL | Status: DC | PRN

## 2018-01-26 NOTE — Progress Notes (Signed)
Physical Therapy Treatment Patient Details Name: Douglas Monroe MRN: [id number] DOB: 12-04-1932 Today's Date: 01/26/2018    History of Present Illness Pt is a 84 yo male admitted 12/17 with large rectal sheath hematoma due to coughing and COPD exacerbation with acute respiratory failure with hypoxia. Pt with previous admission on 12/11, d/ced on the 15th for similar issues. PMH includes HTN, GERD, PVD, AAA s/p stenting 2014, anxiety,  first degree heart block, HOH, HLD, ischemic colitis, L carotid artery stenosis, multiple urological surgeries, lumbar surgery.    PT Comments    The  Patient ambulated x 185 ' with RW today. Patient with noted 3/4 dyspnea.  Continue PT.    Follow Up Recommendations  Supervision for mobility/OOB;No PT follow up     Equipment Recommendations  None recommended by PT    Recommendations for Other Services       Precautions / Restrictions Precautions Precautions: Fall Precaution Comments: monitor sats    Mobility  Bed Mobility Overal bed mobility: Modified Independent                Transfers Overall transfer level: Needs assistance Equipment used: Rolling walker (2 wheeled) Transfers: Sit to/from Stand Sit to Stand: Min guard         General transfer comment: cues for safety , is impulsive  Ambulation/Gait Ambulation/Gait assistance: Min guard Gait Distance (Feet): 185 Feet Assistive device: Rolling walker (2 wheeled) Gait Pattern/deviations: Step-through pattern     General Gait Details: on 2 L [place], sats 965-pr, 935 ost ambulation   Stairs             Wheelchair Mobility    Modified Rankin (Stroke Patients Only)       Balance                                            Cognition Arousal/Alertness: Awake/alert                                            Exercises      General Comments        Pertinent Vitals/Pain Pain Assessment: No/denies pain    Home Living                       Prior Function            PT Goals (current goals can now be found in the care plan section) Progress towards PT goals: Progressing toward goals    Frequency    Min 3X/week      PT Plan Current plan remains appropriate    Co-evaluation              AM-PAC PT "6 Clicks" Mobility   Outcome Measure  Help needed turning from your back to your side while in a flat bed without using bedrails?: None Help needed moving from lying on your back to sitting on the side of a flat bed without using bedrails?: None Help needed moving to and from a bed to a chair (including a wheelchair)?: A Little Help needed standing up from a chair using your arms (e.g., wheelchair or bedside chair)?: A Little Help needed to walk in hospital room?: A Little Help needed climbing 3-5 steps with  a railing? : A Lot 6 Click Score: 19    End of Session Equipment Utilized During Treatment: Gait belt Activity Tolerance: Patient tolerated treatment well Patient left: in bed;with call bell/phone within reach;with family/visitor present Nurse Communication: Mobility status PT Visit Diagnosis: Other abnormalities of gait and mobility (R26.89)     Time: 2902-1115 PT Time Calculation (min) (ACUTE ONLY): 31 min  Charges:  $Gait Training: 23-37 mins                     [place] Pager [phone] Office [phone]    [name] 01/26/2018, 2:02 PM

## 2018-01-26 NOTE — Progress Notes (Signed)
01/26/18  1850  Patient c/o pain right upper side of back. Will notify MD.

## 2018-01-26 NOTE — Progress Notes (Signed)
PROGRESS NOTE    Douglas Monroe  VOJ:[id number] DOB: 05-06-1932 DOA: 01/24/2018 PCP:    Brief Narrative: 84 year old with past medical history significant for COPD, not on home oxygen, peripheral vascular disease status post iliac stenting followed by Dr. Alvester Chou, hypertension, hyperlipidemia who was evaluated by his PCP 7 days a day ago for nonproductive cough and wheezing.  Patient was treated by his PCP with antibiotics and prednisone, he presented 2 days later to the emergency department with persistent cough wheezing worsening shortness of breath.  He was found to have rectus sheath hematoma, with 2 g drop of hemoglobin since last seen in the ED.  Patient admitted with COPD exacerbation, and also found to have rectus sheath hematoma.  He was evaluated by surgery who recommended abdominal binder and blood transfusion as needed.  Hematoma appears to be stable, his hemoglobin has remained stable. Regarding his COPD exacerbation, he he has severe COPD, and he is a slowly improving.  He is still get significant shortness of breath on minimal exertion.  CCM has been helping with his care.  Chest x-ray showed bilateral atelectasis versus infiltrate.  He is on IV ceftriaxone and azithromycin.  He is getting IV Solu-Medrol and nebulizer.  Xanax to help with anxiety.  Assessment & Plan:   Principal Problem:   COPD with acute exacerbation ([place]) Active Problems:   PVD (peripheral vascular disease) with claudication, previous stents to bilateral iliacs. Rt. SFA also.    PVD S/P multiple PCIs   HTN (hypertension)   Abdominal aortic aneurysm ([facility])   S/P arterial stent, to Lt renal art. 11/02/12   Rectus sheath hematoma, initial encounter   Acute respiratory failure with hypoxia ([facility])   1-Acute COPD exacerbation, acute hypoxic respiratory failure; Continue with IV Solu-Medrol every 6 hours,  azithromycin, IV ceftriaxone.  nebulizer treatment. Respiratory failure related to COPD exacerbation and  restriction component form abdominal wall hematoma.  mucinex BID added schedule guaifenesin with codeine.  -on DUONEB, flonase, will stop anoro, PRN albuterol.  -Continue with Xanax to TID PRN, careful with oversedation.  -speech/swallow evaluation. -We will taper the Solu-Medrol to every 12 starting tomorrow. Transfer to telemetry.  2-Rectus sheath hematoma with acute blood loss anemia; Presumed to be after a vigorous coughing spell Surgery consulted. Recommending abdominal binding.  Continue to hold Plavix. Monitor hb, blood transfusion as needed.   3-Peripheral vascular disease, with claudication previous stents to bilateral iliacs. Rt. SFA also.  Stent placement 2014. Follow with Dr. Alvester Chou. Hold Plavix in the setting of acute bleed. CT reviewed by Vascular, patient to follow up with Dr Alvester Chou.   4-Abdominal aortic aneurysm ([place]) Noted for slight increase in size to 3.5 cm.  Follow as outpatient.  5-BPH, bladder outlet obstruction on CT. Resume Flomax and finasteride.   Hypokalemia resolved.  HTN; no further low BP.  Hold Norvasc.   Hyperglycemia: In the setting of IV steroids. Continue with sliding scale insulin.  Abdominal distension:  [place] negative for ascites.  KUB negative for obstruction.  Likely related to abdominal wall hematoma.  Continue with abdominal binder. Patient reports loose BM.  Will reduce the bowel regimen.  Leukocytosis;  Could be related to steroids.  Continue with antibiotics, ceftriaxone  and azithro   CKD stage II; monitor renal function.  DVT prophylaxis: SCDs Code Status: Full code Family Communication: wife at bedside.  Disposition Plan: monitor Hb, respiratory status and BP.   Consultants:  None  Procedures:   None   Antimicrobials: Azithromycin, Ceftriaxone  Subjective:  Feeling still shortnes of breath, abdominal distension is still present. No fever or chills, no BM.   Objective: Vitals:   01/26/18 0800 01/26/18  1119 01/26/18 1247 01/26/18 1729  BP:   (!) 164/88   Pulse: 70  75 81  Resp: (!) 25  18 18   Temp:   98.3 F (36.8 C)   TempSrc:   Oral   SpO2: 97% 97% 95% 91%  Weight:      Height:        Intake/Output Summary (Last 24 hours) at 01/26/2018 1904 Last data filed at 01/26/2018 1900 Gross per 24 hour  Intake 351 ml  Output 1075 ml  Net -724 ml   Filed Weights   01/18/18 1849  Weight: 71 kg    Examination:  General: Alert, Awake and Oriented to Time, Place and Person. Appear in moderate distress, affect appropriate Eyes: PERRL, Conjunctiva normal ENT: Oral Mucosa clear moist. Neck: difficult to assess  JVD, no Abnormal Mass Or lumps Cardiovascular: S1 and S2 Present, no Murmur, Peripheral Pulses Present Respiratory: increased respiratory effort, Bilateral Air entry equal and Decreased, no use of accessory muscle, bilateral basal Crackles, no wheezes Abdomen: Bowel Sound present, Soft and distended with mild tenderness, no hernia Skin: no redness, no Rash, no induration Extremities: no Pedal edema, no calf tenderness Neurologic: Grossly no focal neuro deficit. Bilaterally Equal motor strength  Data Reviewed: I have personally reviewed following labs and imaging studies  CBC: Recent Labs  Lab 01/21/18 0322  01/22/18 1932 01/23/18 0307 01/24/18 0300 01/25/18 0302 01/26/18 0307  WBC 16.4*  --   --  18.9* 16.4* 14.7* 13.3*  NEUTROABS  --   --   --   --   --   --  12.2*  HGB 9.4*   < > 10.3* 10.7* 9.8* 10.2* 10.2*  HCT 30.4*   < > 33.5* 34.2* 31.9* 32.7* 32.8*  MCV 94.4  --   --  92.7 94.1 94.5 92.7  PLT 190  --   --  225 170 166 168   < > = values in this interval not displayed.   Basic Metabolic Panel: Recent Labs  Lab 01/21/18 0322 01/23/18 0307 01/24/18 0300 01/25/18 0302 01/26/18 0307  NA 138 138 138 140 141  K 4.5 4.8 4.3 4.4 4.3  CL 105 100 103 104 105  CO2 25 27 27 27 28   GLUCOSE 159* 217* 166* 176* 179*  BUN 36* 39* 40* 37* 35*  CREATININE 1.15 1.19  1.01 1.03 1.02  CALCIUM 8.4* 8.8* 8.5* 8.4* 8.5*  MG 2.2  --   --   --   --    GFR: Estimated Creatinine Clearance: 45.9 mL/min (by C-G formula based on SCr of 1.02 mg/dL). Liver Function Tests: No results for input(s): AST, ALT, ALKPHOS, BILITOT, PROT, ALBUMIN in the last 168 hours. No results for input(s): LIPASE, AMYLASE in the last 168 hours. No results for input(s): AMMONIA in the last 168 hours. Coagulation Profile: No results for input(s): INR, PROTIME in the last 168 hours. Cardiac Enzymes: No results for input(s): CKTOTAL, CKMB, CKMBINDEX, TROPONINI in the last 168 hours. BNP (last 3 results) No results for input(s): PROBNP in the last 8760 hours. HbA1C: No results for input(s): HGBA1C in the last 72 hours. CBG: Recent Labs  Lab 01/25/18 2141 01/26/18 0800 01/26/18 0906 01/26/18 1114 01/26/18 1708  GLUCAP 276* 133* 130* 245* 105*   Lipid Profile: No results for input(s): CHOL, HDL, LDLCALC, TRIG, CHOLHDL, LDLDIRECT in the  last 72 hours. Thyroid Function Tests: No results for input(s): TSH, T4TOTAL, FREET4, T3FREE, THYROIDAB in the last 72 hours. Anemia Panel: No results for input(s): VITAMINB12, FOLATE, FERRITIN, TIBC, IRON, RETICCTPCT in the last 72 hours. Sepsis Labs: No results for input(s): PROCALCITON, LATICACIDVEN in the last 168 hours.  Recent Results (from the past 240 hour(s))  Respiratory Panel by PCR     Status: None   Collection Time: 01/19/18  9:21 AM  Result Value Ref Range Status   Adenovirus NOT DETECTED NOT DETECTED Final   Coronavirus 229E NOT DETECTED NOT DETECTED Final   Coronavirus HKU1 NOT DETECTED NOT DETECTED Final   Coronavirus NL63 NOT DETECTED NOT DETECTED Final   Coronavirus OC43 NOT DETECTED NOT DETECTED Final   Metapneumovirus NOT DETECTED NOT DETECTED Final   Rhinovirus / Enterovirus NOT DETECTED NOT DETECTED Final   Influenza A NOT DETECTED NOT DETECTED Final   Influenza B NOT DETECTED NOT DETECTED Final   Parainfluenza Virus  1 NOT DETECTED NOT DETECTED Final   Parainfluenza Virus 2 NOT DETECTED NOT DETECTED Final   Parainfluenza Virus 3 NOT DETECTED NOT DETECTED Final   Parainfluenza Virus 4 NOT DETECTED NOT DETECTED Final   Respiratory Syncytial Virus NOT DETECTED NOT DETECTED Final   Bordetella pertussis NOT DETECTED NOT DETECTED Final   Chlamydophila pneumoniae NOT DETECTED NOT DETECTED Final   Mycoplasma pneumoniae NOT DETECTED NOT DETECTED Final    Comment: Performed at [facility], [place]. [address] [place], [place] [address]  MRSA PCR Screening     Status: None   Collection Time: 01/20/18 11:21 AM  Result Value Ref Range Status   MRSA by PCR NEGATIVE NEGATIVE Final    Comment:        The GeneXpert MRSA Assay (FDA approved for NASAL specimens only), is one component of a comprehensive MRSA colonization surveillance program. It is not intended to diagnose MRSA infection nor to guide or monitor treatment for MRSA infections. Performed at [facility], [place] [address], [place], [place] [address]          Radiology Studies: Vas [place] Lower Extremity Venous (dvt)  Result Date: 01/26/2018  Lower Venous Study Indications: Pain.  Comparison Study: 01/22/18 - Negative for bilateral DVT Performing Technologist: Oliver Hum RVT  Examination Guidelines: A complete evaluation includes B-mode imaging, spectral Doppler, color Doppler, and power Doppler as needed of all accessible portions of each vessel. Bilateral testing is considered an integral part of a complete examination. Limited examinations for reoccurring indications may be performed as noted.  Right Venous Findings: +---------+---------------+---------+-----------+----------+-------+          CompressibilityPhasicitySpontaneityPropertiesSummary +---------+---------------+---------+-----------+----------+-------+ CFV      Full           Yes      Yes                           +---------+---------------+---------+-----------+----------+-------+ SFJ      Full                                                 +---------+---------------+---------+-----------+----------+-------+ FV Prox  Full                                                 +---------+---------------+---------+-----------+----------+-------+  FV Mid   Full                                                 +---------+---------------+---------+-----------+----------+-------+ FV DistalFull                                                 +---------+---------------+---------+-----------+----------+-------+ PFV      Full                                                 +---------+---------------+---------+-----------+----------+-------+ POP      Full           Yes      Yes                          +---------+---------------+---------+-----------+----------+-------+ PTV      Full                                                 +---------+---------------+---------+-----------+----------+-------+ PERO     Full                                                 +---------+---------------+---------+-----------+----------+-------+  Left Venous Findings: +---+---------------+---------+-----------+----------+-------+    CompressibilityPhasicitySpontaneityPropertiesSummary +---+---------------+---------+-----------+----------+-------+ CFVFull           Yes      Yes                          +---+---------------+---------+-----------+----------+-------+    Summary: Right: There is no evidence of deep vein thrombosis in the lower extremity. No cystic structure found in the popliteal fossa. Left: No evidence of common femoral vein obstruction.  *See table(s) above for measurements and observations. Electronically signed by Monica Martinez MD on 01/26/2018 at 2:07:07 PM.    Final         Scheduled Meds: . amLODipine  5 mg Oral Daily  . atorvastatin  20 mg Oral Daily  .  azithromycin  500 mg Oral Daily  . benzonatate  200 mg Oral TID  . chlorpheniramine-HYDROcodone  5 mL Oral Q12H  . cholecalciferol  2,000 Units Oral Daily  . famotidine  20 mg Oral Daily  . finasteride  5 mg Oral Daily  . fluticasone  1 spray Each Nare Daily  . guaiFENesin  600 mg Oral BID  . insulin aspart  0-9 Units Subcutaneous TID WC  . ipratropium-albuterol  3 mL Nebulization QID  . loratadine  10 mg Oral Daily  . magnesium oxide  400 mg Oral Daily  . mouth rinse  15 mL Mouth Rinse BID  . methocarbamol  500 mg Oral TID  . methylPREDNISolone (SOLU-MEDROL) injection  40 mg Intravenous Q8H  . nebivolol  10 mg Oral Daily  . pantoprazole  40 mg  Oral BID AC  . senna-docusate  1 tablet Oral BID  . tamsulosin  0.4 mg Oral Daily   Continuous Infusions: . cefTRIAXone (ROCEPHIN)  IV Stopped (01/26/18 1543)     LOS: 9 days    Time spent: 35 minutes   Author:  Berle Mull, MD [facility] Pager: [phone] 01/26/2018 7:04 PM     If 7PM-7AM, please contact night-coverage [email or web address] Password [facility] 01/26/2018, 7:04 PM

## 2018-01-26 NOTE — Plan of Care (Signed)
Patient progressed well through night.  Vitals remained stable.  He was also able to keep coughing spells controled.

## 2018-01-26 NOTE — Progress Notes (Signed)
Right lower extremity venous duplex has been completed. Preliminary results can be found in CV Proc through chart review.   01/26/18 10:44 AM Carlos Levering RVT

## 2018-01-27 LAB — BASIC METABOLIC PANEL
ANION GAP: 9 (ref 5–15)
BUN: 35 mg/dL — ABNORMAL HIGH (ref 8–23)
CALCIUM: 8.5 mg/dL — AB (ref 8.9–10.3)
CO2: 29 mmol/L (ref 22–32)
Chloride: 102 mmol/L (ref 98–111)
Creatinine, Ser: 1.08 mg/dL (ref 0.61–1.24)
GFR calc non Af Amer: >60 mL/min (ref >60)
Glucose, Bld: 215 mg/dL — ABNORMAL HIGH (ref 70–99)
Potassium: 4.6 mmol/L (ref 3.5–5.1)
Sodium: 140 mmol/L (ref 135–145)

## 2018-01-27 LAB — CBC
HCT: 34.2 % — ABNORMAL LOW (ref 39.0–52.0)
Hemoglobin: 10.4 g/dL — ABNORMAL LOW (ref 13.0–17.0)
MCH: 28.6 pg (ref 26.0–34.0)
MCHC: 30.4 g/dL (ref 30.0–36.0)
MCV: 94 fL (ref 80.0–100.0)
NRBC: 0 % (ref 0.0–0.2)
Platelets: 163 10*3/uL (ref 150–400)
RBC: 3.64 MIL/uL — ABNORMAL LOW (ref 4.22–5.81)
RDW: 16.2 % — ABNORMAL HIGH (ref 11.5–15.5)
WBC: 16.5 10*3/uL — ABNORMAL HIGH (ref 4.0–10.5)

## 2018-01-27 LAB — GLUCOSE, CAPILLARY
GLUCOSE-CAPILLARY: 141 mg/dL — AB (ref 70–99)
GLUCOSE-CAPILLARY: 200 mg/dL — AB (ref 70–99)
Glucose-Capillary: 199 mg/dL — ABNORMAL HIGH (ref 70–99)
Glucose-Capillary: 156 mg/dL — ABNORMAL HIGH (ref 70–99)

## 2018-01-27 MED ORDER — METHYLPREDNISOLONE SODIUM SUCC 40 MG IJ SOLR
40.0000 mg | Freq: Two times a day (BID) | INTRAMUSCULAR | Status: DC
  Administered 2018-01-27 – 2018-01-31 (×8): 40 mg via INTRAVENOUS
  Filled 2018-01-27 (×8): qty 1

## 2018-01-27 NOTE — Progress Notes (Signed)
Pt sats 87% on RA before neb tx. Pt sats 97% on RA after neb tx.

## 2018-01-27 NOTE — Care Management Note (Addendum)
Case Management Note  Patient Details  Name: Douglas Monroe MRN: [id number] Date of Birth: Nov 05, 1932  Subjective/Objective:           COPD        Discharge readiness is indicated by patient meeting Recovery Milestones, including ALL of the following: ? Hemodynamic stability  Temp=98.3 ? resp rate=22 ? Mental status at baseline yes ? No evidence of infectionWBC=16.5 ? Eating and sleeping without frequent dyspnea no still has dyspnea ? Breathing comfortably at rest at rest yes ? Ambulatory yes ? Oral hydration, medications, and diet ? nebulizers and iv solu-medrol, iv rocephin Patient is not on home o2 at this time  Action/Plan: Following for progression and level of care Following for dc needs and planning  Expected Discharge Date:  (unknown)               Expected Discharge Plan:     In-House Referral:     Discharge planning Services     Post Acute Care Choice:    Choice offered to:     DME Arranged:    DME Agency:     HH Arranged:    HH Agency:     Status of Service:     If discussed at [employer], dates discussed:    Additional Comments:  Leeroy Cha, RN 01/27/2018, 10:17 AM

## 2018-01-27 NOTE — Progress Notes (Signed)
PROGRESS NOTE    Douglas Monroe  ELF:[id number] DOB: 1932/06/24 DOA: 01/19/2018 PCP:    Brief Narrative: 84 year old with past medical history significant for COPD, not on home oxygen, peripheral vascular disease status post iliac stenting followed by Dr. Alvester Chou, hypertension, hyperlipidemia who was evaluated by his PCP 7 days a day ago for nonproductive cough and wheezing.  Patient was treated by his PCP with antibiotics and prednisone, he presented 2 days later to the emergency department with persistent cough wheezing worsening shortness of breath.  He was found to have rectus sheath hematoma, with 2 g drop of hemoglobin since last seen in the ED.  Patient admitted with COPD exacerbation, and also found to have rectus sheath hematoma.  He was evaluated by surgery who recommended abdominal binder and blood transfusion as needed.  Hematoma appears to be stable, his hemoglobin has remained stable. Regarding his COPD exacerbation, he he has severe COPD, and he is a slowly improving.  He is still get significant shortness of breath on minimal exertion.  CCM has been helping with his care.  Chest x-ray showed bilateral atelectasis versus infiltrate.  He is on IV ceftriaxone and azithromycin.  He is getting IV Solu-Medrol and nebulizer.  Xanax to help with anxiety.  Assessment & Plan:   Principal Problem:   COPD with acute exacerbation ([place]) Active Problems:   PVD (peripheral vascular disease) with claudication, previous stents to bilateral iliacs. Rt. SFA also.    PVD S/P multiple PCIs   HTN (hypertension)   Abdominal aortic aneurysm ([facility])   S/P arterial stent, to Lt renal art. 11/02/12   Rectus sheath hematoma, initial encounter   Acute respiratory failure with hypoxia (HCC)  Acute COPD exacerbation, acute hypoxic respiratory failure; Continue with IV Solu-Medrol every 6 hours,  azithromycin, IV ceftriaxone.  nebulizer treatment. Respiratory failure related to COPD exacerbation and  restriction component form abdominal wall hematoma.  mucinex BID added schedule guaifenesin with codeine.  -on DUONEB, flonase, will stop anoro, PRN albuterol.  -Continue with Xanax to TID PRN, careful with oversedation.  -speech/swallow evaluation. -We will taper the Solu-Medrol to every 12 .  Rectus sheath hematoma with acute blood loss anemia; Presumed to be after a vigorous coughing spell Surgery consulted. Recommending abdominal binding.  Continue to hold Plavix. Monitor hb, blood transfusion as needed.   Peripheral vascular disease, with claudication previous stents to bilateral iliacs. Rt. SFA also.  Stent placement 2014. Follow with Dr. Alvester Chou. Hold Plavix in the setting of acute bleed. CT reviewed by Vascular, patient to follow up with Dr Alvester Chou.   Abdominal aortic aneurysm ([place]) Noted for slight increase in size to 3.5 cm.  Follow as outpatient.  BPH, bladder outlet obstruction on CT. Resume Flomax and finasteride.   Hypokalemia resolved.  HTN; no further low BP.  Hold Norvasc.   Hyperglycemia: In the setting of IV steroids. Continue with sliding scale insulin.  Abdominal distension:  [place] negative for ascites.  KUB negative for obstruction.  Likely related to abdominal wall hematoma.  Continue with abdominal binder. Patient reports loose BM.  Will reduce the bowel regimen.  Leukocytosis;  Could be related to steroids.  Continue with antibiotics, ceftriaxone  and azithro   CKD stage II; monitor renal function.  DVT prophylaxis: SCDs Code Status: Full code Family Communication: wife at bedside.  Disposition Plan: Potentially can be transferred to the home in 48 hours.  While the PT has recommended no outpatient follow-up I feel that the patient will benefit from home  health RN.  Consultants:  None  Procedures:   None   Antimicrobials: Azithromycin, Ceftriaxone  Subjective: Unable to sleep last night due to cough.  No nausea no vomiting no fever no  chills.  Wife took flutter valve at home.  No diarrhea.  Objective: Vitals:   01/27/18 0529 01/27/18 0744 01/27/18 0754 01/27/18 1345  BP: 138/66   (!) 141/74  Pulse: 61   85  Resp: (!) 21   18  Temp: (!) 97.4 F (36.3 C)   98 F (36.7 C)  TempSrc: Oral   Oral  SpO2: 95% (!) 87% 98% 94%  Weight:      Height:        Intake/Output Summary (Last 24 hours) at 01/27/2018 1819 Last data filed at 01/27/2018 0100 Gross per 24 hour  Intake 100 ml  Output 575 ml  Net -475 ml   Filed Weights   01/18/18 1849  Weight: 71 kg    Examination:  General: Alert, Awake and Oriented to Time, Place and Person. Appear in moderate distress, affect appropriate Eyes: PERRL, Conjunctiva normal ENT: Oral Mucosa clear moist. Neck: difficult to assess  JVD, no Abnormal Mass Or lumps Cardiovascular: S1 and S2 Present, no Murmur, Peripheral Pulses Present Respiratory: increased respiratory effort, Bilateral Air entry equal and Decreased, no use of accessory muscle, bilateral basal Crackles, no wheezes Abdomen: Bowel Sound present, Soft and distended with mild tenderness, no hernia Skin: no redness, no Rash, no induration Extremities: no Pedal edema, no calf tenderness Neurologic: Grossly no focal neuro deficit. Bilaterally Equal motor strength  Data Reviewed: I have personally reviewed following labs and imaging studies  CBC: Recent Labs  Lab 01/23/18 0307 01/24/18 0300 01/25/18 0302 01/26/18 0307 01/27/18 0551  WBC 18.9* 16.4* 14.7* 13.3* 16.5*  NEUTROABS  --   --   --  12.2*  --   HGB 10.7* 9.8* 10.2* 10.2* 10.4*  HCT 34.2* 31.9* 32.7* 32.8* 34.2*  MCV 92.7 94.1 94.5 92.7 94.0  PLT 225 170 166 168 119   Basic Metabolic Panel: Recent Labs  Lab 01/21/18 0322 01/23/18 0307 01/24/18 0300 01/25/18 0302 01/26/18 0307 01/27/18 0551  NA 138 138 138 140 141 140  K 4.5 4.8 4.3 4.4 4.3 4.6  CL 105 100 103 104 105 102  CO2 25 27 27 27 28 29   GLUCOSE 159* 217* 166* 176* 179* 215*  BUN  36* 39* 40* 37* 35* 35*  CREATININE 1.15 1.19 1.01 1.03 1.02 1.08  CALCIUM 8.4* 8.8* 8.5* 8.4* 8.5* 8.5*  MG 2.2  --   --   --   --   --    GFR: Estimated Creatinine Clearance: 43.4 mL/min (by C-G formula based on SCr of 1.08 mg/dL). Liver Function Tests: No results for input(s): AST, ALT, ALKPHOS, BILITOT, PROT, ALBUMIN in the last 168 hours. No results for input(s): LIPASE, AMYLASE in the last 168 hours. No results for input(s): AMMONIA in the last 168 hours. Coagulation Profile: No results for input(s): INR, PROTIME in the last 168 hours. Cardiac Enzymes: No results for input(s): CKTOTAL, CKMB, CKMBINDEX, TROPONINI in the last 168 hours. BNP (last 3 results) No results for input(s): PROBNP in the last 8760 hours. HbA1C: No results for input(s): HGBA1C in the last 72 hours. CBG: Recent Labs  Lab 01/26/18 1114 01/26/18 1708 01/27/18 0802 01/27/18 1135 01/27/18 1642  GLUCAP 245* 105* 199* 141* 200*   Lipid Profile: No results for input(s): CHOL, HDL, LDLCALC, TRIG, CHOLHDL, LDLDIRECT in the last 72  hours. Thyroid Function Tests: No results for input(s): TSH, T4TOTAL, FREET4, T3FREE, THYROIDAB in the last 72 hours. Anemia Panel: No results for input(s): VITAMINB12, FOLATE, FERRITIN, TIBC, IRON, RETICCTPCT in the last 72 hours. Sepsis Labs: No results for input(s): PROCALCITON, LATICACIDVEN in the last 168 hours.  Recent Results (from the past 240 hour(s))  Respiratory Panel by PCR     Status: None   Collection Time: 01/19/18  9:21 AM  Result Value Ref Range Status   Adenovirus NOT DETECTED NOT DETECTED Final   Coronavirus 229E NOT DETECTED NOT DETECTED Final   Coronavirus HKU1 NOT DETECTED NOT DETECTED Final   Coronavirus NL63 NOT DETECTED NOT DETECTED Final   Coronavirus OC43 NOT DETECTED NOT DETECTED Final   Metapneumovirus NOT DETECTED NOT DETECTED Final   Rhinovirus / Enterovirus NOT DETECTED NOT DETECTED Final   Influenza A NOT DETECTED NOT DETECTED Final    Influenza B NOT DETECTED NOT DETECTED Final   Parainfluenza Virus 1 NOT DETECTED NOT DETECTED Final   Parainfluenza Virus 2 NOT DETECTED NOT DETECTED Final   Parainfluenza Virus 3 NOT DETECTED NOT DETECTED Final   Parainfluenza Virus 4 NOT DETECTED NOT DETECTED Final   Respiratory Syncytial Virus NOT DETECTED NOT DETECTED Final   Bordetella pertussis NOT DETECTED NOT DETECTED Final   Chlamydophila pneumoniae NOT DETECTED NOT DETECTED Final   Mycoplasma pneumoniae NOT DETECTED NOT DETECTED Final    Comment: Performed at [facility], [place]. [address], [place], [place] [address]  MRSA PCR Screening     Status: None   Collection Time: 01/20/18 11:21 AM  Result Value Ref Range Status   MRSA by PCR NEGATIVE NEGATIVE Final    Comment:        The GeneXpert MRSA Assay (FDA approved for NASAL specimens only), is one component of a comprehensive MRSA colonization surveillance program. It is not intended to diagnose MRSA infection nor to guide or monitor treatment for MRSA infections. Performed at [facility], [place] [address], [place], [place] [address]          Radiology Studies: Dg Chest Port 1 View  Result Date: 01/26/2018 CLINICAL DATA:  Cough for 1 week EXAM: PORTABLE CHEST 1 VIEW COMPARISON:  01/22/2018 FINDINGS: Cardiac shadow is stable. Aortic calcifications are again seen. The lungs are well aerated bilaterally with patchy bibasilar infiltrates stable from the prior exam. Some new increased density in the right upper lobe is noted consistent with some progressive infiltrate. No acute bony abnormality is noted. IMPRESSION: Bibasilar infiltrates stable from the prior exam. Increasing infiltrate in the right upper lobe particularly along the paratracheal region. Electronically Signed   By: Inez Catalina M.D.   On: 01/26/2018 19:35   Vas [place] Lower Extremity Venous (dvt)  Result Date: 01/26/2018  Lower Venous Study Indications: Pain.  Comparison Study: 01/22/18  - Negative for bilateral DVT Performing Technologist: Oliver Hum RVT  Examination Guidelines: A complete evaluation includes B-mode imaging, spectral Doppler, color Doppler, and power Doppler as needed of all accessible portions of each vessel. Bilateral testing is considered an integral part of a complete examination. Limited examinations for reoccurring indications may be performed as noted.  Right Venous Findings: +---------+---------------+---------+-----------+----------+-------+          CompressibilityPhasicitySpontaneityPropertiesSummary +---------+---------------+---------+-----------+----------+-------+ CFV      Full           Yes      Yes                          +---------+---------------+---------+-----------+----------+-------+  SFJ      Full                                                 +---------+---------------+---------+-----------+----------+-------+ FV Prox  Full                                                 +---------+---------------+---------+-----------+----------+-------+ FV Mid   Full                                                 +---------+---------------+---------+-----------+----------+-------+ FV DistalFull                                                 +---------+---------------+---------+-----------+----------+-------+ PFV      Full                                                 +---------+---------------+---------+-----------+----------+-------+ POP      Full           Yes      Yes                          +---------+---------------+---------+-----------+----------+-------+ PTV      Full                                                 +---------+---------------+---------+-----------+----------+-------+ PERO     Full                                                 +---------+---------------+---------+-----------+----------+-------+  Left Venous Findings:  +---+---------------+---------+-----------+----------+-------+    CompressibilityPhasicitySpontaneityPropertiesSummary +---+---------------+---------+-----------+----------+-------+ CFVFull           Yes      Yes                          +---+---------------+---------+-----------+----------+-------+    Summary: Right: There is no evidence of deep vein thrombosis in the lower extremity. No cystic structure found in the popliteal fossa. Left: No evidence of common femoral vein obstruction.  *See table(s) above for measurements and observations. Electronically signed by Monica Martinez MD on 01/26/2018 at 2:07:07 PM.    Final         Scheduled Meds: . amLODipine  5 mg Oral Daily  . atorvastatin  20 mg Oral Daily  . azithromycin  500 mg Oral Daily  . benzonatate  200 mg Oral TID  . chlorpheniramine-HYDROcodone  5 mL Oral Q12H  . cholecalciferol  2,000 Units Oral Daily  .  famotidine  20 mg Oral Daily  . finasteride  5 mg Oral Daily  . fluticasone  1 spray Each Nare Daily  . guaiFENesin  600 mg Oral BID  . insulin aspart  0-9 Units Subcutaneous TID WC  . ipratropium-albuterol  3 mL Nebulization QID  . loratadine  10 mg Oral Daily  . magnesium oxide  400 mg Oral Daily  . mouth rinse  15 mL Mouth Rinse BID  . methocarbamol  500 mg Oral TID  . methylPREDNISolone (SOLU-MEDROL) injection  40 mg Intravenous Q12H  . nebivolol  10 mg Oral Daily  . pantoprazole  40 mg Oral BID AC  . senna-docusate  1 tablet Oral BID  . tamsulosin  0.4 mg Oral Daily   Continuous Infusions: . cefTRIAXone (ROCEPHIN)  IV 1 g (01/27/18 1606)     LOS: 10 days    Time spent: 35 minutes   Author:  Berle Mull, MD [facility] Pager: [phone] 01/27/2018 6:19 PM     If 7PM-7AM, please contact night-coverage [email or web address] Password [facility] 01/27/2018, 6:19 PM

## 2018-01-27 NOTE — Care Management Important Message (Signed)
Important Message  Patient Details  Name: Douglas Monroe MRN: [id number] Date of Birth: [birth year]   Medicare Important Message Given:  Yes    Cierrah Dace 01/27/2018, 8:36 AM

## 2018-01-27 NOTE — Progress Notes (Signed)
  Speech Language Pathology Treatment: Dysphagia  Patient Details Name: Douglas Monroe MRN: [id number] DOB: 1932/11/02 Today's Date: 01/27/2018 Time: 7619-5093 SLP Time Calculation (min) (ACUTE ONLY): 28 min  Assessment / Plan / Recommendation Clinical Impression  Skilled observation with intake of solids/thin liquids via cup with min verbal cues required for small sips/slow rate and awareness d/t decreased sustained attention during eating; recent CXR on 01/26/18 indicated Increasing infiltrate in the right upper lobe particularly along the paratracheal region; pt exhibited a delayed cough and minimally hoarse vocal quality, but symptoms eliminated and/or reduced with utilization of swallowing strategies and precautions during intake; slow but thorough mastication of solids noted d/t pt being edentulous, but repetitive dry swallows and throat clear/cough intermittently assisted with pharyngeal clearance with min verbal cues to complete consistently; recommend continue regular diet/thin liquids with swallowing precautions in place; ST will continue efforts in acute setting for diet tolerance/safety during consumption  HPI HPI: 84 yo male adm to [facility] 01/24/2018 with breathing difficulties.  Pt PMH + for COPD, PVD, AAA, GERD for which he takes a PPI and ranitidine.  Pt noted to become "choked" with liquids during meal this week and swallow eval ordered.  CXR initially negative for acute findings but recent image concerning for right upper lobe pna- 01/22/18.  MBS on 01/24/18 indicated penetration before swallow d/t decreased sensation; no aspiration noted and potential esophageal phase involvement noted      SLP Plan  Continue with current plan of care       Recommendations  Diet recommendations: Regular;Thin liquid Liquids provided via: Cup;Straw Medication Administration: Whole meds with puree Supervision: Patient able to self feed Compensations: Small sips/bites;Slow rate;Multiple dry swallows  after each bite/sip Postural Changes and/or Swallow Maneuvers: Seated upright 90 degrees;Upright 30-60 min after meal                General recommendations: Other(comment)(TBD) Oral Care Recommendations: Oral care BID Follow up Recommendations: Other (comment)(TBD) SLP Visit Diagnosis: Dysphagia, oropharyngeal phase (R13.12) Plan: Continue with current plan of care                      Elvina Sidle, M.S., CCC-SLP 01/27/2018, 2:29 PM

## 2018-01-28 LAB — BASIC METABOLIC PANEL
ANION GAP: 9 (ref 5–15)
Anion gap: 9 (ref 5–15)
BUN: 32 mg/dL — ABNORMAL HIGH (ref 8–23)
BUN: 33 mg/dL — ABNORMAL HIGH (ref 8–23)
CO2: 30 mmol/L (ref 22–32)
CO2: 29 mmol/L (ref 22–32)
Calcium: 8.7 mg/dL — ABNORMAL LOW (ref 8.9–10.3)
Calcium: 8.7 mg/dL — ABNORMAL LOW (ref 8.9–10.3)
Chloride: 99 mmol/L (ref 98–111)
Chloride: 99 mmol/L (ref 98–111)
Creatinine, Ser: 1.22 mg/dL (ref 0.61–1.24)
Creatinine, Ser: 1.07 mg/dL (ref 0.61–1.24)
GFR calc Af Amer: >60 mL/min (ref >60)
GFR calc Af Amer: >60 mL/min (ref >60)
GFR calc non Af Amer: 54 mL/min — ABNORMAL LOW (ref >60)
GFR calc non Af Amer: >60 mL/min (ref >60)
GLUCOSE: 180 mg/dL — AB (ref 70–99)
Glucose, Bld: 187 mg/dL — ABNORMAL HIGH (ref 70–99)
Potassium: 5.6 mmol/L — ABNORMAL HIGH (ref 3.5–5.1)
Potassium: 4.5 mmol/L (ref 3.5–5.1)
Sodium: 138 mmol/L (ref 135–145)
Sodium: 137 mmol/L (ref 135–145)

## 2018-01-28 LAB — GLUCOSE, CAPILLARY: Glucose-Capillary: 159 mg/dL — ABNORMAL HIGH (ref 70–99)

## 2018-01-28 LAB — CBC
HCT: 33.9 % — ABNORMAL LOW (ref 39.0–52.0)
Hemoglobin: 10.4 g/dL — ABNORMAL LOW (ref 13.0–17.0)
MCH: 28.7 pg (ref 26.0–34.0)
MCHC: 30.7 g/dL (ref 30.0–36.0)
MCV: 93.6 fL (ref 80.0–100.0)
Platelets: 145 10*3/uL — ABNORMAL LOW (ref 150–400)
RBC: 3.62 MIL/uL — ABNORMAL LOW (ref 4.22–5.81)
RDW: 16.2 % — ABNORMAL HIGH (ref 11.5–15.5)
WBC: 19.8 10*3/uL — ABNORMAL HIGH (ref 4.0–10.5)
nRBC: 0 % (ref 0.0–0.2)

## 2018-01-28 MED ORDER — IPRATROPIUM-ALBUTEROL 0.5-2.5 (3) MG/3ML IN SOLN
RESPIRATORY_TRACT | Status: AC
  Filled 2018-01-28: qty 3

## 2018-01-28 NOTE — Progress Notes (Signed)
PROGRESS NOTE    Douglas Monroe  ZHG:[id number] DOB: [birth year] DOA: 01/28/2018 PCP:    Brief Narrative: 84 year old with past medical history significant for COPD, not on home oxygen, peripheral vascular disease status post iliac stenting followed by Dr. Alvester Chou, hypertension, hyperlipidemia who was evaluated by his PCP 7 days a day ago for nonproductive cough and wheezing.  Patient was treated by his PCP with antibiotics and prednisone, he presented 2 days later to the emergency department with persistent cough wheezing worsening shortness of breath.  He was found to have rectus sheath hematoma, with 2 g drop of hemoglobin since last seen in the ED.  Patient admitted with COPD exacerbation, and also found to have rectus sheath hematoma.  He was evaluated by surgery who recommended abdominal binder and blood transfusion as needed.  Hematoma appears to be stable, his hemoglobin has remained stable. Regarding his COPD exacerbation, he he has severe COPD, and he is a slowly improving.  He is still get significant shortness of breath on minimal exertion.  CCM has been helping with his care.  Chest x-ray showed bilateral atelectasis versus infiltrate.  He is on IV ceftriaxone and azithromycin.  He is getting IV Solu-Medrol and nebulizer.  Xanax to help with anxiety.  01/28/2018: Patient seen alongside patient's son.  No new complaints.  Patient is still requiring 3 L of supplemental oxygen.  Apparently, patient was not on oxygen prior to admission.  Patient continues to report intermittent episodes of anxiety/panic.  Patient was asking about Xanax on discharge.  Discussed with the patient and patient's son extensively regarding potential dangers of benzodiazepine in setting of respiratory compromise.  We will continue to discuss same with patient.  We will continue current management for now.  Initial labs revealed potassium of 5.6 and mild AKI.  However, repeat BMP revealed normal potassium and renal  function.  Continue to monitor closely.  Assessment & Plan:   Principal Problem:   COPD with acute exacerbation ([place]) Active Problems:   PVD (peripheral vascular disease) with claudication, previous stents to bilateral iliacs. Rt. SFA also.    PVD S/P multiple PCIs   HTN (hypertension)   Abdominal aortic aneurysm (HCC)   S/P arterial stent, to Lt renal art. 11/02/12   Rectus sheath hematoma, initial encounter   Acute respiratory failure with hypoxia (HCC)  Acute COPD exacerbation, acute hypoxic respiratory failure; Continue with IV Solu-Medrol every 6 hours,  azithromycin, IV ceftriaxone.  nebulizer treatment. Respiratory failure related to COPD exacerbation and restriction component form abdominal wall hematoma.  mucinex BID added schedule guaifenesin with codeine.  -on DUONEB, flonase, will stop anoro, PRN albuterol.  -Continue with Xanax to TID PRN, careful with oversedation.  -speech/swallow evaluation. -We will taper the Solu-Medrol to every 12 . 01/28/2018: Continue current management for COPD with exacerbation.  Rectus sheath hematoma with acute blood loss anemia; Presumed to be after a vigorous coughing spell Surgery consulted. Recommending abdominal binding.  Continue to hold Plavix. Monitor hb, blood transfusion as needed.  01/28/2018: Hemoglobin has remained stable.  Hemoglobin was 10.4 g/dL today.  Peripheral vascular disease, with claudication previous stents to bilateral iliacs. Rt. SFA also.  Stent placement 2014. Follow with Dr. Alvester Chou. Hold Plavix in the setting of acute bleed. CT reviewed by Vascular, patient to follow up with Dr Alvester Chou.   Abdominal aortic aneurysm ([facility]) Noted for slight increase in size to 3.5 cm.  Follow as outpatient.  BPH, bladder outlet obstruction on CT. Resume Flomax and finasteride.  Hypokalemia resolved.  HTN; no further low BP.  Hold Norvasc.   Hyperglycemia: In the setting of IV steroids. Continue with sliding scale  insulin.  Abdominal distension:  [place] negative for ascites.  KUB negative for obstruction.  Likely related to abdominal wall hematoma.  Continue with abdominal binder. Patient reports loose BM.  Will reduce the bowel regimen.  Leukocytosis;  Could be related to steroids.  Continue with antibiotics, ceftriaxone  and azithro  01/28/2018: Continue to monitor.  CKD stage II; monitor renal function. 01/28/2018: Serum creatinine is 1.07 today.  DVT prophylaxis: SCDs Code Status: Full code Family Communication: Son.  Disposition Plan: Will depend on hospital course..  Consultants:  Critical care/pulmonary team saw patient earlier.   Procedures:   None   Antimicrobials: Azithromycin, Ceftriaxone  Subjective: No new complaints. Reports intermittent anxiety/panic spells.  Objective: Vitals:   01/28/18 0420 01/28/18 0740 01/28/18 0917 01/28/18 1157  BP: (!) 145/68  (!) 150/71   Pulse: 81  77   Resp: (!) 23     Temp: 98.1 F (36.7 C)     TempSrc: Oral     SpO2: 96% 95% 97% 96%  Weight:      Height:        Intake/Output Summary (Last 24 hours) at 01/28/2018 1204 Last data filed at 01/28/2018 0900 Gross per 24 hour  Intake 120 ml  Output 800 ml  Net -680 ml   Filed Weights   01/18/18 1849  Weight: 71 kg    Examination:  General: Alert, Awake and Oriented to Time, Place and Person. Appear in moderate distress, affect appropriate Eyes: PERRL, Conjunctiva normal ENT: Oral Mucosa clear moist. Neck: difficult to assess  JVD, no Abnormal Mass Or lumps Cardiovascular: S1 and S2 Present, no Murmur, Peripheral Pulses Present Respiratory: increased respiratory effort, Bilateral Air entry equal and Decreased, no use of accessory muscle, bilateral basal Crackles, no wheezes Abdomen: Bowel Sound present, Soft and distended with mild tenderness, no hernia Skin: no redness, no Rash, no induration Extremities: no Pedal edema, no calf tenderness Neurologic: Grossly no focal  neuro deficit. Bilaterally Equal motor strength  Data Reviewed: I have personally reviewed following labs and imaging studies  CBC: Recent Labs  Lab 01/24/18 0300 01/25/18 0302 01/26/18 0307 01/27/18 0551 01/28/18 0644  WBC 16.4* 14.7* 13.3* 16.5* 19.8*  NEUTROABS  --   --  12.2*  --   --   HGB 9.8* 10.2* 10.2* 10.4* 10.4*  HCT 31.9* 32.7* 32.8* 34.2* 33.9*  MCV 94.1 94.5 92.7 94.0 93.6  PLT 170 166 168 163 664*   Basic Metabolic Panel: Recent Labs  Lab 01/24/18 0300 01/25/18 0302 01/26/18 0307 01/27/18 0551 01/28/18 0644  NA 138 140 141 140 138  K 4.3 4.4 4.3 4.6 5.6*  CL 103 104 105 102 99  CO2 27 27 28 29 30   GLUCOSE 166* 176* 179* 215* 180*  BUN 40* 37* 35* 35* 32*  CREATININE 1.01 1.03 1.02 1.08 1.22  CALCIUM 8.5* 8.4* 8.5* 8.5* 8.7*   GFR: Estimated Creatinine Clearance: 38.4 mL/min (by C-G formula based on SCr of 1.22 mg/dL). Liver Function Tests: No results for input(s): AST, ALT, ALKPHOS, BILITOT, PROT, ALBUMIN in the last 168 hours. No results for input(s): LIPASE, AMYLASE in the last 168 hours. No results for input(s): AMMONIA in the last 168 hours. Coagulation Profile: No results for input(s): INR, PROTIME in the last 168 hours. Cardiac Enzymes: No results for input(s): CKTOTAL, CKMB, CKMBINDEX, TROPONINI in the last 168 hours.  BNP (last 3 results) No results for input(s): PROBNP in the last 8760 hours. HbA1C: No results for input(s): HGBA1C in the last 72 hours. CBG: Recent Labs  Lab 01/27/18 0802 01/27/18 1135 01/27/18 1642 01/27/18 2018 01/28/18 0740  GLUCAP 199* 141* 200* 156* 159*   Lipid Profile: No results for input(s): CHOL, HDL, LDLCALC, TRIG, CHOLHDL, LDLDIRECT in the last 72 hours. Thyroid Function Tests: No results for input(s): TSH, T4TOTAL, FREET4, T3FREE, THYROIDAB in the last 72 hours. Anemia Panel: No results for input(s): VITAMINB12, FOLATE, FERRITIN, TIBC, IRON, RETICCTPCT in the last 72 hours. Sepsis Labs: No results  for input(s): PROCALCITON, LATICACIDVEN in the last 168 hours.  Recent Results (from the past 240 hour(s))  Respiratory Panel by PCR     Status: None   Collection Time: 01/19/18  9:21 AM  Result Value Ref Range Status   Adenovirus NOT DETECTED NOT DETECTED Final   Coronavirus 229E NOT DETECTED NOT DETECTED Final   Coronavirus HKU1 NOT DETECTED NOT DETECTED Final   Coronavirus NL63 NOT DETECTED NOT DETECTED Final   Coronavirus OC43 NOT DETECTED NOT DETECTED Final   Metapneumovirus NOT DETECTED NOT DETECTED Final   Rhinovirus / Enterovirus NOT DETECTED NOT DETECTED Final   Influenza A NOT DETECTED NOT DETECTED Final   Influenza B NOT DETECTED NOT DETECTED Final   Parainfluenza Virus 1 NOT DETECTED NOT DETECTED Final   Parainfluenza Virus 2 NOT DETECTED NOT DETECTED Final   Parainfluenza Virus 3 NOT DETECTED NOT DETECTED Final   Parainfluenza Virus 4 NOT DETECTED NOT DETECTED Final   Respiratory Syncytial Virus NOT DETECTED NOT DETECTED Final   Bordetella pertussis NOT DETECTED NOT DETECTED Final   Chlamydophila pneumoniae NOT DETECTED NOT DETECTED Final   Mycoplasma pneumoniae NOT DETECTED NOT DETECTED Final    Comment: Performed at [facility], [place]. [address], [place], [place] [address]  MRSA PCR Screening     Status: None   Collection Time: 01/20/18 11:21 AM  Result Value Ref Range Status   MRSA by PCR NEGATIVE NEGATIVE Final    Comment:        The GeneXpert MRSA Assay (FDA approved for NASAL specimens only), is one component of a comprehensive MRSA colonization surveillance program. It is not intended to diagnose MRSA infection nor to guide or monitor treatment for MRSA infections. Performed at [facility], [place] [address], [place], [place] [address]          Radiology Studies: Dg Chest Port 1 View  Result Date: 01/26/2018 CLINICAL DATA:  Cough for 1 week EXAM: PORTABLE CHEST 1 VIEW COMPARISON:  01/22/2018 FINDINGS: Cardiac shadow is  stable. Aortic calcifications are again seen. The lungs are well aerated bilaterally with patchy bibasilar infiltrates stable from the prior exam. Some new increased density in the right upper lobe is noted consistent with some progressive infiltrate. No acute bony abnormality is noted. IMPRESSION: Bibasilar infiltrates stable from the prior exam. Increasing infiltrate in the right upper lobe particularly along the paratracheal region. Electronically Signed   By: Inez Catalina M.D.   On: 01/26/2018 19:35        Scheduled Meds: . amLODipine  5 mg Oral Daily  . atorvastatin  20 mg Oral Daily  . azithromycin  500 mg Oral Daily  . benzonatate  200 mg Oral TID  . chlorpheniramine-HYDROcodone  5 mL Oral Q12H  . cholecalciferol  2,000 Units Oral Daily  . famotidine  20 mg Oral Daily  . finasteride  5 mg  Oral Daily  . fluticasone  1 spray Each Nare Daily  . guaiFENesin  600 mg Oral BID  . insulin aspart  0-9 Units Subcutaneous TID WC  . ipratropium-albuterol  3 mL Nebulization QID  . loratadine  10 mg Oral Daily  . magnesium oxide  400 mg Oral Daily  . mouth rinse  15 mL Mouth Rinse BID  . methocarbamol  500 mg Oral TID  . methylPREDNISolone (SOLU-MEDROL) injection  40 mg Intravenous Q12H  . nebivolol  10 mg Oral Daily  . pantoprazole  40 mg Oral BID AC  . senna-docusate  1 tablet Oral BID  . tamsulosin  0.4 mg Oral Daily   Continuous Infusions: . cefTRIAXone (ROCEPHIN)  IV 1 g (01/27/18 1606)     LOS: 11 days    Time spent: 35 minutes   Author:  Bonnell Public, MD  [facility] Pager: [phone] 01/28/2018 12:04 PM     If 7PM-7AM, please contact night-coverage [email or web address] Password [facility] 01/28/2018, 12:04 PM

## 2018-01-29 LAB — CBC
HCT: 36.3 % — ABNORMAL LOW (ref 39.0–52.0)
Hemoglobin: 11 g/dL — ABNORMAL LOW (ref 13.0–17.0)
MCH: 28.9 pg (ref 26.0–34.0)
MCHC: 30.3 g/dL (ref 30.0–36.0)
MCV: 95.3 fL (ref 80.0–100.0)
Platelets: 163 K/uL (ref 150–400)
RBC: 3.81 MIL/uL — ABNORMAL LOW (ref 4.22–5.81)
RDW: 16.2 % — ABNORMAL HIGH (ref 11.5–15.5)
WBC: 21.8 K/uL — ABNORMAL HIGH (ref 4.0–10.5)
nRBC: 0 % (ref 0.0–0.2)

## 2018-01-29 LAB — BASIC METABOLIC PANEL
ANION GAP: 9 (ref 5–15)
BUN: 30 mg/dL — ABNORMAL HIGH (ref 8–23)
CHLORIDE: 99 mmol/L (ref 98–111)
CO2: 27 mmol/L (ref 22–32)
Calcium: 8.5 mg/dL — ABNORMAL LOW (ref 8.9–10.3)
Creatinine, Ser: 1.09 mg/dL (ref 0.61–1.24)
GFR calc Af Amer: >60 mL/min (ref >60)
GFR calc non Af Amer: >60 mL/min (ref >60)
Glucose, Bld: 158 mg/dL — ABNORMAL HIGH (ref 70–99)
Potassium: 5 mmol/L (ref 3.5–5.1)
Sodium: 135 mmol/L (ref 135–145)

## 2018-01-29 LAB — GLUCOSE, CAPILLARY
GLUCOSE-CAPILLARY: 153 mg/dL — AB (ref 70–99)
GLUCOSE-CAPILLARY: 143 mg/dL — AB (ref 70–99)
Glucose-Capillary: 177 mg/dL — ABNORMAL HIGH (ref 70–99)
Glucose-Capillary: 163 mg/dL — ABNORMAL HIGH (ref 70–99)
Glucose-Capillary: 182 mg/dL — ABNORMAL HIGH (ref 70–99)
Glucose-Capillary: 152 mg/dL — ABNORMAL HIGH (ref 70–99)
Glucose-Capillary: 190 mg/dL — ABNORMAL HIGH (ref 70–99)

## 2018-01-29 NOTE — Progress Notes (Signed)
PROGRESS NOTE    Douglas Monroe  KWI:[id number] DOB: December 13, 1932 DOA: 01/30/2018 PCP:    Brief Narrative: 84 year old with past medical history significant for COPD, not on home oxygen, peripheral vascular disease status post iliac stenting followed by Dr. Alvester Chou, hypertension, hyperlipidemia who was evaluated by his PCP 7 days a day ago for nonproductive cough and wheezing.  Patient was treated by his PCP with antibiotics and prednisone, he presented 2 days later to the emergency department with persistent cough wheezing worsening shortness of breath.  He was found to have rectus sheath hematoma, with 2 g drop of hemoglobin since last seen in the ED.  Patient admitted with COPD exacerbation, and also found to have rectus sheath hematoma.  He was evaluated by surgery who recommended abdominal binder and blood transfusion as needed.  Hematoma appears to be stable, his hemoglobin has remained stable. Regarding his COPD exacerbation, he he has severe COPD, and he is a slowly improving.  He is still get significant shortness of breath on minimal exertion.  CCM has been helping with his care.  Chest x-ray showed bilateral atelectasis versus infiltrate.  He is on IV ceftriaxone and azithromycin.  He is getting IV Solu-Medrol and nebulizer.  Xanax to help with anxiety.  01/28/2018: Patient seen alongside patient's son.  No new complaints.  Patient is still requiring 3 L of supplemental oxygen.  Apparently, patient was not on oxygen prior to admission.  Patient continues to report intermittent episodes of anxiety/panic.  Patient was asking about Xanax on discharge.  Discussed with the patient and patient's son extensively regarding potential dangers of benzodiazepine in setting of respiratory compromise.  We will continue to discuss same with patient.  We will continue current management for now.  Initial labs revealed potassium of 5.6 and mild AKI.  However, repeat BMP revealed normal potassium and renal  function.  Continue to monitor closely.  01/29/2018: Patient seen.  Patient is still very tight.  Patient was dyspneic on minimal exertion.  Continue current management.  Will reconsult PT OT consult.  Patient may need short-term rehab, at the very minimum.  Assessment & Plan:   Principal Problem:   COPD with acute exacerbation ([place]) Active Problems:   PVD (peripheral vascular disease) with claudication, previous stents to bilateral iliacs. Rt. SFA also.    PVD S/P multiple PCIs   HTN (hypertension)   Abdominal aortic aneurysm (HCC)   S/P arterial stent, to Lt renal art. 11/02/12   Rectus sheath hematoma, initial encounter   Acute respiratory failure with hypoxia (HCC)  Acute COPD exacerbation, acute hypoxic respiratory failure; Continue with IV Solu-Medrol every 6 hours,  azithromycin, IV ceftriaxone.  nebulizer treatment. Respiratory failure related to COPD exacerbation and restriction component form abdominal wall hematoma.  mucinex BID added schedule guaifenesin with codeine.  -on DUONEB, flonase, will stop anoro, PRN albuterol.  -Continue with Xanax to TID PRN, careful with oversedation.  -speech/swallow evaluation. -We will taper the Solu-Medrol to every 12 . 01/29/2018: Continue current management for COPD with exacerbation.  Rectus sheath hematoma with acute blood loss anemia; Presumed to be after a vigorous coughing spell Surgery consulted. Recommending abdominal binding.  Continue to hold Plavix. Monitor hb, blood transfusion as needed.  01/28/2018: Hemoglobin has remained stable.  Hemoglobin was 10.4 g/dL today.  Peripheral vascular disease, with claudication previous stents to bilateral iliacs. Rt. SFA also.  Stent placement 2014. Follow with Dr. Alvester Chou. Hold Plavix in the setting of acute bleed. CT reviewed by Vascular, patient to  follow up with Dr Alvester Chou.   Abdominal aortic aneurysm ([place]) Noted for slight increase in size to 3.5 cm.  Follow as outpatient.  BPH,  bladder outlet obstruction on CT. Resume Flomax and finasteride.   Hypokalemia resolved.  HTN; no further low BP.  Hold Norvasc.   Hyperglycemia: In the setting of IV steroids. Continue with sliding scale insulin.  Abdominal distension:  [place] negative for ascites.  KUB negative for obstruction.  Likely related to abdominal wall hematoma.  Continue with abdominal binder. Patient reports loose BM.  Will reduce the bowel regimen.  Leukocytosis;  Could be related to steroids.  Continue with antibiotics, ceftriaxone  and azithro  01/28/2018: Continue to monitor.  CKD stage II; monitor renal function. 01/28/2018: Serum creatinine is 1.07 today.  DVT prophylaxis: SCDs Code Status: Full code Family Communication: Son.  Disposition Plan: Will depend on hospital course..  Consultants:  Critical care/pulmonary team saw patient earlier.   Procedures:   None   Antimicrobials: Azithromycin, Ceftriaxone  Subjective: No new complaints. Reports intermittent anxiety/panic spells.  Objective: Vitals:   01/29/18 0620 01/29/18 0817 01/29/18 1227 01/29/18 1353  BP: 135/80   113/69  Pulse: 72   (!) 105  Resp: 16   20  Temp: 98 F (36.7 C)   99.1 F (37.3 C)  TempSrc: Oral   Oral  SpO2: 97% 96% 91% 95%  Weight:      Height:        Intake/Output Summary (Last 24 hours) at 01/29/2018 1525 Last data filed at 01/29/2018 6073 Gross per 24 hour  Intake 200 ml  Output 325 ml  Net -125 ml   Filed Weights   01/18/18 1849  Weight: 71 kg    Examination:  General: Alert, Awake and Oriented to Time, Place and Person. Appear in moderate distress, affect appropriate Eyes: PERRL, Conjunctiva normal ENT: Oral Mucosa clear moist. Neck: difficult to assess  JVD, no Abnormal Mass Or lumps Cardiovascular: S1 and S2 Present, no Murmur, Peripheral Pulses Present Respiratory: increased respiratory effort, Bilateral Air entry equal and Decreased, no use of accessory muscle, bilateral  basal Crackles, no wheezes Abdomen: Bowel Sound present, Soft and distended with mild tenderness, no hernia Skin: no redness, no Rash, no induration Extremities: no Pedal edema, no calf tenderness Neurologic: Grossly no focal neuro deficit. Bilaterally Equal motor strength  Data Reviewed: I have personally reviewed following labs and imaging studies  CBC: Recent Labs  Lab 01/25/18 0302 01/26/18 0307 01/27/18 0551 01/28/18 0644 01/29/18 0757  WBC 14.7* 13.3* 16.5* 19.8* 21.8*  NEUTROABS  --  12.2*  --   --   --   HGB 10.2* 10.2* 10.4* 10.4* 11.0*  HCT 32.7* 32.8* 34.2* 33.9* 36.3*  MCV 94.5 92.7 94.0 93.6 95.3  PLT 166 168 163 145* 710   Basic Metabolic Panel: Recent Labs  Lab 01/26/18 0307 01/27/18 0551 01/28/18 0644 01/28/18 1126 01/29/18 0757  NA 141 140 138 137 135  K 4.3 4.6 5.6* 4.5 5.0  CL 105 102 99 99 99  CO2 28 29 30 29 27   GLUCOSE 179* 215* 180* 187* 158*  BUN 35* 35* 32* 33* 30*  CREATININE 1.02 1.08 1.22 1.07 1.09  CALCIUM 8.5* 8.5* 8.7* 8.7* 8.5*   GFR: Estimated Creatinine Clearance: 43 mL/min (by C-G formula based on SCr of 1.09 mg/dL). Liver Function Tests: No results for input(s): AST, ALT, ALKPHOS, BILITOT, PROT, ALBUMIN in the last 168 hours. No results for input(s): LIPASE, AMYLASE in the last 168 hours.  No results for input(s): AMMONIA in the last 168 hours. Coagulation Profile: No results for input(s): INR, PROTIME in the last 168 hours. Cardiac Enzymes: No results for input(s): CKTOTAL, CKMB, CKMBINDEX, TROPONINI in the last 168 hours. BNP (last 3 results) No results for input(s): PROBNP in the last 8760 hours. HbA1C: No results for input(s): HGBA1C in the last 72 hours. CBG: Recent Labs  Lab 01/28/18 1158 01/28/18 1617 01/28/18 2132 01/29/18 0804 01/29/18 1208  GLUCAP 177* 163* 182* 153* 152*   Lipid Profile: No results for input(s): CHOL, HDL, LDLCALC, TRIG, CHOLHDL, LDLDIRECT in the last 72 hours. Thyroid Function Tests: No  results for input(s): TSH, T4TOTAL, FREET4, T3FREE, THYROIDAB in the last 72 hours. Anemia Panel: No results for input(s): VITAMINB12, FOLATE, FERRITIN, TIBC, IRON, RETICCTPCT in the last 72 hours. Sepsis Labs: No results for input(s): PROCALCITON, LATICACIDVEN in the last 168 hours.  Recent Results (from the past 240 hour(s))  MRSA PCR Screening     Status: None   Collection Time: 01/20/18 11:21 AM  Result Value Ref Range Status   MRSA by PCR NEGATIVE NEGATIVE Final    Comment:        The GeneXpert MRSA Assay (FDA approved for NASAL specimens only), is one component of a comprehensive MRSA colonization surveillance program. It is not intended to diagnose MRSA infection nor to guide or monitor treatment for MRSA infections. Performed at [facility], [place] [address] [place], [place] [address]          Radiology Studies: No results found.      Scheduled Meds: . amLODipine  5 mg Oral Daily  . atorvastatin  20 mg Oral Daily  . benzonatate  200 mg Oral TID  . chlorpheniramine-HYDROcodone  5 mL Oral Q12H  . cholecalciferol  2,000 Units Oral Daily  . famotidine  20 mg Oral Daily  . finasteride  5 mg Oral Daily  . fluticasone  1 spray Each Nare Daily  . guaiFENesin  600 mg Oral BID  . insulin aspart  0-9 Units Subcutaneous TID WC  . ipratropium-albuterol  3 mL Nebulization QID  . loratadine  10 mg Oral Daily  . magnesium oxide  400 mg Oral Daily  . mouth rinse  15 mL Mouth Rinse BID  . methocarbamol  500 mg Oral TID  . methylPREDNISolone (SOLU-MEDROL) injection  40 mg Intravenous Q12H  . nebivolol  10 mg Oral Daily  . pantoprazole  40 mg Oral BID AC  . senna-docusate  1 tablet Oral BID  . tamsulosin  0.4 mg Oral Daily   Continuous Infusions:    LOS: 12 days    Time spent: 35 minutes   Author:  Bonnell Public, MD  [facility] Pager: [phone] 01/29/2018 3:25 PM     If 7PM-7AM, please contact  night-coverage [email or web address] Password [facility] 01/29/2018, 3:25 PM

## 2018-01-29 NOTE — Progress Notes (Signed)
Pt refused ABG.  Pt is resting comfortably on 3L nasal cannula sats are 94%

## 2018-01-30 LAB — CBC WITH DIFFERENTIAL/PLATELET
Abs Immature Granulocytes: 0.15 10*3/uL — ABNORMAL HIGH (ref 0.00–0.07)
Basophils Absolute: 0 10*3/uL (ref 0.0–0.1)
Basophils Relative: 0 %
Eosinophils Absolute: 0 10*3/uL (ref 0.0–0.5)
Eosinophils Relative: 0 %
HCT: 35.2 % — ABNORMAL LOW (ref 39.0–52.0)
Hemoglobin: 10.9 g/dL — ABNORMAL LOW (ref 13.0–17.0)
Immature Granulocytes: 1 %
Lymphocytes Relative: 2 %
Lymphs Abs: 0.3 10*3/uL — ABNORMAL LOW (ref 0.7–4.0)
MCH: 29.3 pg (ref 26.0–34.0)
MCHC: 31 g/dL (ref 30.0–36.0)
MCV: 94.6 fL (ref 80.0–100.0)
Monocytes Absolute: 0.4 10*3/uL (ref 0.1–1.0)
Monocytes Relative: 3 %
Neutro Abs: 15.1 10*3/uL — ABNORMAL HIGH (ref 1.7–7.7)
Neutrophils Relative %: 94 %
Platelets: 126 10*3/uL — ABNORMAL LOW (ref 150–400)
RBC: 3.72 MIL/uL — ABNORMAL LOW (ref 4.22–5.81)
RDW: 16.4 % — ABNORMAL HIGH (ref 11.5–15.5)
WBC: 16 10*3/uL — ABNORMAL HIGH (ref 4.0–10.5)
nRBC: 0 % (ref 0.0–0.2)

## 2018-01-30 LAB — GLUCOSE, CAPILLARY
GLUCOSE-CAPILLARY: 218 mg/dL — AB (ref 70–99)
Glucose-Capillary: 143 mg/dL — ABNORMAL HIGH (ref 70–99)
Glucose-Capillary: 224 mg/dL — ABNORMAL HIGH (ref 70–99)
Glucose-Capillary: 226 mg/dL — ABNORMAL HIGH (ref 70–99)

## 2018-01-30 LAB — RENAL FUNCTION PANEL
Albumin: 2 g/dL — ABNORMAL LOW (ref 3.5–5.0)
Anion gap: 9 (ref 5–15)
BUN: 30 mg/dL — ABNORMAL HIGH (ref 8–23)
CO2: 26 mmol/L (ref 22–32)
Calcium: 8.4 mg/dL — ABNORMAL LOW (ref 8.9–10.3)
Chloride: 101 mmol/L (ref 98–111)
Creatinine, Ser: 0.98 mg/dL (ref 0.61–1.24)
GFR calc Af Amer: >60 mL/min (ref >60)
GFR calc non Af Amer: >60 mL/min (ref >60)
Glucose, Bld: 171 mg/dL — ABNORMAL HIGH (ref 70–99)
Phosphorus: 4 mg/dL (ref 2.5–4.6)
Potassium: 5.3 mmol/L — ABNORMAL HIGH (ref 3.5–5.1)
Sodium: 136 mmol/L (ref 135–145)

## 2018-01-30 LAB — BASIC METABOLIC PANEL
Anion gap: 8 (ref 5–15)
BUN: 30 mg/dL — ABNORMAL HIGH (ref 8–23)
CHLORIDE: 100 mmol/L (ref 98–111)
CO2: 27 mmol/L (ref 22–32)
Calcium: 8.4 mg/dL — ABNORMAL LOW (ref 8.9–10.3)
Creatinine, Ser: 1.07 mg/dL (ref 0.61–1.24)
GFR calc Af Amer: >60 mL/min (ref >60)
GFR calc non Af Amer: >60 mL/min (ref >60)
Glucose, Bld: 171 mg/dL — ABNORMAL HIGH (ref 70–99)
Potassium: 5.2 mmol/L — ABNORMAL HIGH (ref 3.5–5.1)
Sodium: 135 mmol/L (ref 135–145)

## 2018-01-30 LAB — MAGNESIUM: Magnesium: 2.1 mg/dL (ref 1.7–2.4)

## 2018-01-30 LAB — PROCALCITONIN: Procalcitonin: 0.78 ng/mL

## 2018-01-30 NOTE — Progress Notes (Signed)
Physical Therapy Treatment Patient Details Name: Douglas Monroe MRN: [id number] DOB: [birth year] Today's Date: 01/30/2018    History of Present Illness Pt is a 84 yo male admitted 12/17 with large rectal sheath hematoma due to coughing and COPD exacerbation with acute respiratory failure with hypoxia. Pt with previous admission on 12/11, d/ced on the 15th for similar issues. PMH includes HTN, GERD, PVD, AAA s/p stenting 2014, anxiety,  first degree heart block, HOH, HLD, ischemic colitis, L carotid artery stenosis, multiple urological surgeries, lumbar surgery.    PT Comments    The patient only tolerated 20' ambulation this visit with sats at 92% on 2 L. While sitting in recliner, checked saturation on RA with O2 dropping to 88%. Discussed DC plans with patient and wife who desire for patient  to go HOme. Both report family available intermittently. No family present.Currently patient is compromised for respiratory status  And my require home O2.   Follow Up Recommendations  SNF;Home health PT     Equipment Recommendations  Rolling walker with 5" wheels;Wheelchair (measurements PT) if DC home for safety if unable to ambulate functional distance.   Recommendations for Other Services OT consult     Precautions / Restrictions Precautions Precautions: Fall Precaution Comments: monitor sats    Mobility  Bed Mobility Overal bed mobility: Modified Independent                Transfers Overall transfer level: Needs assistance Equipment used: Rolling walker (2 wheeled) Transfers: Sit to/from Stand Sit to Stand: Min assist         General transfer comment: assist to  power up from bed  Ambulation/Gait Ambulation/Gait assistance: Min guard Gait Distance (Feet): 20 Feet Assistive device: Rolling walker (2 wheeled) Gait Pattern/deviations: Step-to pattern;Step-through pattern     General Gait Details: on 2 L [place], sats 92%   Stairs             Wheelchair Mobility    Modified Rankin (Stroke Patients Only)       Balance                                            Cognition Arousal/Alertness: Awake/alert                                            Exercises      General Comments        Pertinent Vitals/Pain Pain Assessment: No/denies pain    Home Living                      Prior Function            PT Goals (current goals can now be found in the care plan section) Progress towards PT goals: Not progressing toward goals - comment(more sob with activity)    Frequency    Min 3X/week      PT Plan Current plan remains appropriate    Co-evaluation              AM-PAC PT "6 Clicks" Mobility   Outcome Measure  Help needed turning from your back to your side while in a flat bed without using bedrails?: None Help needed moving from lying on your back to sitting on  the side of a flat bed without using bedrails?: None Help needed moving to and from a bed to a chair (including a wheelchair)?: A Little Help needed standing up from a chair using your arms (e.g., wheelchair or bedside chair)?: A Little Help needed to walk in hospital room?: A Little Help needed climbing 3-5 steps with a railing? : A Lot 6 Click Score: 19    End of Session   Activity Tolerance: Treatment limited secondary to medical complications (Comment) Patient left: in chair;with call bell/phone within reach;with family/visitor present Nurse Communication: Mobility status PT Visit Diagnosis: Unsteadiness on feet (R26.81)     Time: 6767-2094 PT Time Calculation (min) (ACUTE ONLY): 30 min  Charges:  $Gait Training: 23-37 mins                     [place] Pager [phone] Office [phone]    [name] 01/30/2018, 4:03 PM

## 2018-01-30 NOTE — Progress Notes (Signed)
  Speech Language Pathology Treatment: Dysphagia  Patient Details Name: Douglas Monroe MRN: [id number] DOB: [birth year] Today's Date: 01/30/2018 Time: 1015-1040 SLP Time Calculation (min) (ACUTE ONLY): 25 min  Assessment / Plan / Recommendation Clinical Impression  Pt is tolerating a regular diet with thin liquids with no overt s/s of aspiration.  Pt with RR/WOB that may have a tendency to interfere with coordination of respiratory/swallow reciprocity.  Provided education to pt/wife re: the need to slow down when eating/drinking, take frequent rest breaks, and to postpone eating when pt feels he is working hard to breathe. Reviewed use of chin tuck PRN if it improves comfort and decreases coughing.  Pt's swallowing appears to be stable.  Continue current regular diet, thin liquids, meds whole in puree. No further SLP needs are identified - our service will sign off.   HPI HPI: 84 yo male adm to [facility]/19/2018 with breathing difficulties.  Pt PMH + for COPD, PVD, AAA, GERD for which he takes a PPI and ranitidine.  Pt noted to become "choked" with liquids during meal this week and swallow eval ordered.  CXR initially negative for acute findings but recent image concerning for right upper lobe pna- 01/22/18.  MBS indicated due to concerns for dysphagia/aspiration.        SLP Plan  All goals met       Recommendations  Diet recommendations: Regular;Thin liquid Liquids provided via: Cup;Straw Medication Administration: Whole meds with puree Supervision: Patient able to self feed Compensations: Small sips/bites;Slow rate;Multiple dry swallows after each bite/sip Postural Changes and/or Swallow Maneuvers: Seated upright 90 degrees;Upright 30-60 min after meal                Oral Care Recommendations: Oral care BID Follow up Recommendations: None SLP Visit Diagnosis: Dysphagia, oropharyngeal phase (R13.12) Plan: All goals met       GO               Margart Zemanek L. Tivis Ringer, [place] Office number [phone] Pager [phone]  Juan Quam Laurice 01/30/2018, 10:42 AM

## 2018-01-30 NOTE — Progress Notes (Signed)
Occupational Therapy Evaluation Patient Details Name: Douglas Monroe MRN: [id number] DOB: 1932/07/06 Today's Date: 01/30/2018    History of Present Illness Pt is a 84 yo male admitted 12/17 with large rectal sheath hematoma due to coughing and COPD exacerbation with acute respiratory failure with hypoxia. Pt with previous admission on 12/11, d/ced on the 15th for similar issues. PMH includes HTN, GERD, PVD, AAA s/p stenting 2014, anxiety,  first degree heart block, HOH, HLD, ischemic colitis, L carotid artery stenosis, multiple urological surgeries, lumbar surgery.   Clinical Impression   PTA, pt lived at home with his wife and was independent with ADL and mobility. Pt was active with hobbies and mowing his yard. Pt demonstrates a significant functional decline due to deficits listed below. Sitting on RA O2 @ 91. Pt ambulated to bathroom then back to chair on RA and pt desat to 85. Dyspnea 3/4. Pt will benefit from rehab at SNF to facilitate return to PLOF. Encourage pt to increase time OOB and ambulate with staff.  Will follow acutely.     Follow Up Recommendations  SNF;Supervision/Assistance - 24 hour    Equipment Recommendations  None recommended by OT    Recommendations for Other Services       Precautions / Restrictions Precautions Precautions: Fall Precaution Comments: monitor sats Restrictions Weight Bearing Restrictions: No      Mobility Bed Mobility               General bed mobility comments: in recliner  Transfers Overall transfer level: Needs assistance Equipment used: Rolling walker (2 wheeled) Transfers: Sit to/from Stand Sit to Stand: Min assist         General transfer comment: assist to  power up from chair    Balance Overall balance assessment: Mild deficits observed, not formally tested                                         ADL either performed or assessed with clinical judgement   ADL Overall ADL's : Needs  assistance/impaired Eating/Feeding: Independent Eating/Feeding Details (indicate cue type and reason): poor po intake per wife     Upper Body Bathing: Set up;Sitting   Lower Body Bathing: Minimal assistance;Sit to/from stand   Upper Body Dressing : Set up;Sitting   Lower Body Dressing: Minimal assistance;Sit to/from stand   Toilet Transfer: Minimal assistance;Ambulation;RW;Comfort height toilet;Grab bars   Toileting- Clothing Manipulation and Hygiene: Supervision/safety;Sit to/from stand       Functional mobility during ADLs: Minimal assistance;Rolling walker General ADL Comments: Desat to 85 on RA with mobility. Dyspnea 3/4     Vision         Perception     Praxis      Pertinent Vitals/Pain Pain Assessment: No/denies pain Pain Location: abdomen, with coughing  Pain Descriptors / Indicators: Sore Pain Intervention(s): Limited activity within patient's tolerance     Hand Dominance Right   Extremity/Trunk Assessment Upper Extremity Assessment Upper Extremity Assessment: Generalized weakness   Lower Extremity Assessment Lower Extremity Assessment: Defer to PT evaluation       Communication Communication Communication: No difficulties   Cognition Arousal/Alertness: Awake/alert Behavior During Therapy: WFL for tasks assessed/performed Overall Cognitive Status: Within Functional Limits for tasks assessed  General Comments       Exercises     Shoulder Instructions      Home Living Family/patient expects to be discharged to:: Private residence Living Arrangements: Spouse/significant other Available Help at Discharge: Family;Available 24 hours/day(Pt's wife uses wheelchair for long distances, but husband states she can assist with ADLs) Type of Home: House Home Access: Ramped entrance     Home Layout: Multi-level;Able to live on main level with bedroom/bathroom Alternate Level Stairs-Number of Steps:  flight  Alternate Level Stairs-Rails: Right Bathroom Shower/Tub: Teacher, early years/pre: Standard Bathroom Accessibility: Yes How Accessible: Accessible via walker Home Equipment: Ivor - 2 wheels   Additional Comments: wife has a walker, but she doesn't use it very often.       Prior Functioning/Environment Level of Independence: Independent        Comments: Pt reports being independent prior to admission, does laundry frequently which is located in the basement. Pt reports recent shortness of breath has slowed him down with ADLs and hobbies (mowing the grass).         OT Problem List: Decreased strength;Decreased activity tolerance;Decreased safety awareness;Decreased knowledge of use of DME or AE;Cardiopulmonary status limiting activity      OT Treatment/Interventions: Self-care/ADL training;Therapeutic exercise;Energy conservation;DME and/or AE instruction;Therapeutic activities;Patient/family education    OT Goals(Current goals can be found in the care plan section) Acute Rehab OT Goals Patient Stated Goal: " to get better" OT Goal Formulation: With patient Time For Goal Achievement: 02/14/17 Potential to Achieve Goals: Good  OT Frequency: Min 2X/week   Barriers to D/C:            Co-evaluation              AM-PAC OT "6 Clicks" Daily Activity     Outcome Measure Help from another person eating meals?: None Help from another person taking care of personal grooming?: A Little Help from another person toileting, which includes using toliet, bedpan, or urinal?: A Little Help from another person bathing (including washing, rinsing, drying)?: A Little Help from another person to put on and taking off regular upper body clothing?: A Little Help from another person to put on and taking off regular lower body clothing?: A Little 6 Click Score: 19   End of Session Equipment Utilized During Treatment: Gait belt;Rolling walker;Oxygen(2L) Nurse  Communication: Mobility status  Activity Tolerance: Patient tolerated treatment well Patient left: in chair;with call bell/phone within reach;with family/visitor present  OT Visit Diagnosis: Unsteadiness on feet (R26.81);Muscle weakness (generalized) (M62.81)                Time: 6834-1962 OT Time Calculation (min): 31 min Charges:  OT General Charges $OT Visit: 1 Visit OT Evaluation $OT Eval Moderate Complexity: 1 Mod OT Treatments $Self Care/Home Management : 8-22 mins  Maurie Boettcher, OT/L   Acute OT Clinical Specialist [place] Pager [phone] Office [phone]   [facility] 01/30/2018, 5:08 PM

## 2018-01-30 NOTE — Progress Notes (Signed)
PROGRESS NOTE    Douglas Monroe  ONG:[id number] DOB: 01-16-1933 DOA: 01/05/2018 PCP:    Brief Narrative: 84 year old with past medical history significant for COPD, not on home oxygen, peripheral vascular disease status post iliac stenting followed by Dr. Alvester Chou, hypertension, hyperlipidemia who was evaluated by his PCP 7 days a day ago for nonproductive cough and wheezing.  Patient was treated by his PCP with antibiotics and prednisone, he presented 2 days later to the emergency department with persistent cough wheezing worsening shortness of breath.  He was found to have rectus sheath hematoma, with 2 g drop of hemoglobin since last seen in the ED.  Patient admitted with COPD exacerbation, and also found to have rectus sheath hematoma.  He was evaluated by surgery who recommended abdominal binder and blood transfusion as needed.  Hematoma appears to be stable, his hemoglobin has remained stable. Regarding his COPD exacerbation, he he has severe COPD, and he is a slowly improving.  He is still get significant shortness of breath on minimal exertion.  CCM has been helping with his care.  Chest x-ray showed bilateral atelectasis versus infiltrate.  He is on IV ceftriaxone and azithromycin.  He is getting IV Solu-Medrol and nebulizer.  Xanax to help with anxiety.  01/28/2018: Patient seen alongside patient's son.  No new complaints.  Patient is still requiring 3 L of supplemental oxygen.  Apparently, patient was not on oxygen prior to admission.  Patient continues to report intermittent episodes of anxiety/panic.  Patient was asking about Xanax on discharge.  Discussed with the patient and patient's son extensively regarding potential dangers of benzodiazepine in setting of respiratory compromise.  We will continue to discuss same with patient.  We will continue current management for now.  Initial labs revealed potassium of 5.6 and mild AKI.  However, repeat BMP revealed normal potassium and renal  function.  Continue to monitor closely.  01/29/2018: Patient seen.  Patient is still very tight.  Patient was dyspneic on minimal exertion.  Continue current management.  Will reconsult PT OT consult.  Patient may need short-term rehab, at the very minimum.  01/30/2018: Patient seen.  Patient remains dyspneic on very minimal exertion.  Minimal air entry is noted.  We will continue current management for now, as patient is now stable for discharge.  Patient may need SNF on discharge.  Assessment & Plan:   Principal Problem:   COPD with acute exacerbation ([place]) Active Problems:   PVD (peripheral vascular disease) with claudication, previous stents to bilateral iliacs. Rt. SFA also.    PVD S/P multiple PCIs   HTN (hypertension)   Abdominal aortic aneurysm (HCC)   S/P arterial stent, to Lt renal art. 11/02/12   Rectus sheath hematoma, initial encounter   Acute respiratory failure with hypoxia (HCC)  Acute COPD exacerbation, acute hypoxic respiratory failure; Continue with IV Solu-Medrol every 6 hours,  azithromycin, IV ceftriaxone.  nebulizer treatment. Respiratory failure related to COPD exacerbation and restriction component form abdominal wall hematoma.  mucinex BID added schedule guaifenesin with codeine.  -on DUONEB, flonase, will stop anoro, PRN albuterol.  -Continue with Xanax to TID PRN, careful with oversedation.  -speech/swallow evaluation. -We will taper the Solu-Medrol to every 12 . 01/30/2018: Continue current management for COPD with exacerbation.  Likely SNF on discharge  Rectus sheath hematoma with acute blood loss anemia; Presumed to be after a vigorous coughing spell Surgery consulted. Recommending abdominal binding.  Continue to hold Plavix. Monitor hb, blood transfusion as needed.  01/28/2018: Hemoglobin  has remained stable.  Hemoglobin was 10.4 g/dL today.  Peripheral vascular disease, with claudication previous stents to bilateral iliacs. Rt. SFA also.  Stent  placement 2014. Follow with Dr. Alvester Chou. Hold Plavix in the setting of acute bleed. CT reviewed by Vascular, patient to follow up with Dr Alvester Chou.   Abdominal aortic aneurysm ([place]) Noted for slight increase in size to 3.5 cm.  Follow as outpatient.  BPH, bladder outlet obstruction on CT. Resume Flomax and finasteride.   Hypokalemia resolved.  HTN; no further low BP.  Hold Norvasc.   Hyperglycemia: In the setting of IV steroids. Continue with sliding scale insulin.  Abdominal distension:  [place] negative for ascites.  KUB negative for obstruction.  Likely related to abdominal wall hematoma.  Continue with abdominal binder. Patient reports loose BM.  Will reduce the bowel regimen.  Leukocytosis;  Could be related to steroids.  Continue with antibiotics, ceftriaxone  and azithro  01/28/2018: Continue to monitor.  CKD stage II; monitor renal function. 01/28/2018: Serum creatinine is 1.07 today.  DVT prophylaxis: SCDs Code Status: Full code Family Communication: Son.  Disposition Plan: Will depend on hospital course..  Consultants:  Critical care/pulmonary team saw patient earlier.   Procedures:   None   Antimicrobials: Azithromycin, Ceftriaxone  Subjective: No new complaints. Very dyspneic on minimal exertion.  Objective: Vitals:   01/30/18 0507 01/30/18 0558 01/30/18 0850 01/30/18 1420  BP: 135/72   120/61  Pulse: 75   85  Resp: 19   16  Temp: 97.7 F (36.5 C)   98.1 F (36.7 C)  TempSrc: Oral     SpO2: 95% 91% 94% 92%  Weight:      Height:        Intake/Output Summary (Last 24 hours) at 01/30/2018 1856 Last data filed at 01/30/2018 1657 Gross per 24 hour  Intake 240 ml  Output 300 ml  Net -60 ml   Filed Weights   01/18/18 1849  Weight: 71 kg    Examination:  General: Alert, Awake and Oriented to Time, Place and Person. Appear in moderate distress, affect appropriate Eyes: PERRL, Conjunctiva normal ENT: Oral Mucosa clear moist. Neck:  difficult to assess  JVD, no Abnormal Mass Or lumps Cardiovascular: S1 and S2 Present, no Murmur, Peripheral Pulses Present Respiratory: increased respiratory effort, Bilateral Air entry equal and Decreased, no use of accessory muscle, bilateral basal Crackles, no wheezes Abdomen: Bowel Sound present, Soft and distended with mild tenderness, no hernia Skin: no redness, no Rash, no induration Extremities: no Pedal edema, no calf tenderness Neurologic: Grossly no focal neuro deficit. Bilaterally Equal motor strength  Data Reviewed: I have personally reviewed following labs and imaging studies  CBC: Recent Labs  Lab 01/26/18 0307 01/27/18 0551 01/28/18 0644 01/29/18 0757 01/30/18 0547  WBC 13.3* 16.5* 19.8* 21.8* 16.0*  NEUTROABS 12.2*  --   --   --  15.1*  HGB 10.2* 10.4* 10.4* 11.0* 10.9*  HCT 32.8* 34.2* 33.9* 36.3* 35.2*  MCV 92.7 94.0 93.6 95.3 94.6  PLT 168 163 145* 163 671*   Basic Metabolic Panel: Recent Labs  Lab 01/27/18 0551 01/28/18 0644 01/28/18 1126 01/29/18 0757 01/30/18 0547  NA 140 138 137 135 136  135  K 4.6 5.6* 4.5 5.0 5.3*  5.2*  CL 102 99 99 99 101  100  CO2 29 30 29 27 26  27   GLUCOSE 215* 180* 187* 158* 171*  171*  BUN 35* 32* 33* 30* 30*  30*  CREATININE 1.08 1.22 1.07  1.09 0.98  1.07  CALCIUM 8.5* 8.7* 8.7* 8.5* 8.4*  8.4*  MG  --   --   --   --  2.1  PHOS  --   --   --   --  4.0   GFR: Estimated Creatinine Clearance: 43.8 mL/min (by C-G formula based on SCr of 1.07 mg/dL). Liver Function Tests: Recent Labs  Lab 01/30/18 0547  ALBUMIN 2.0*   No results for input(s): LIPASE, AMYLASE in the last 168 hours. No results for input(s): AMMONIA in the last 168 hours. Coagulation Profile: No results for input(s): INR, PROTIME in the last 168 hours. Cardiac Enzymes: No results for input(s): CKTOTAL, CKMB, CKMBINDEX, TROPONINI in the last 168 hours. BNP (last 3 results) No results for input(s): PROBNP in the last 8760 hours. HbA1C: No  results for input(s): HGBA1C in the last 72 hours. CBG: Recent Labs  Lab 01/29/18 1724 01/29/18 2029 01/30/18 0735 01/30/18 1153 01/30/18 1554  GLUCAP 190* 143* 143* 218* 224*   Lipid Profile: No results for input(s): CHOL, HDL, LDLCALC, TRIG, CHOLHDL, LDLDIRECT in the last 72 hours. Thyroid Function Tests: No results for input(s): TSH, T4TOTAL, FREET4, T3FREE, THYROIDAB in the last 72 hours. Anemia Panel: No results for input(s): VITAMINB12, FOLATE, FERRITIN, TIBC, IRON, RETICCTPCT in the last 72 hours. Sepsis Labs: Recent Labs  Lab 01/30/18 1203  PROCALCITON 0.78    No results found for this or any previous visit (from the past 240 hour(s)).       Radiology Studies: No results found.      Scheduled Meds: . amLODipine  5 mg Oral Daily  . atorvastatin  20 mg Oral Daily  . benzonatate  200 mg Oral TID  . chlorpheniramine-HYDROcodone  5 mL Oral Q12H  . cholecalciferol  2,000 Units Oral Daily  . famotidine  20 mg Oral Daily  . finasteride  5 mg Oral Daily  . fluticasone  1 spray Each Nare Daily  . guaiFENesin  600 mg Oral BID  . insulin aspart  0-9 Units Subcutaneous TID WC  . ipratropium-albuterol  3 mL Nebulization QID  . loratadine  10 mg Oral Daily  . magnesium oxide  400 mg Oral Daily  . mouth rinse  15 mL Mouth Rinse BID  . methocarbamol  500 mg Oral TID  . methylPREDNISolone (SOLU-MEDROL) injection  40 mg Intravenous Q12H  . nebivolol  10 mg Oral Daily  . pantoprazole  40 mg Oral BID AC  . senna-docusate  1 tablet Oral BID  . tamsulosin  0.4 mg Oral Daily   Continuous Infusions:    LOS: 13 days    Time spent: 35 minutes   Author:  Bonnell Public, MD  [facility] Pager: [phone] 01/30/2018 6:56 PM     If 7PM-7AM, please contact night-coverage [email or web address] Password [facility] 01/30/2018, 6:56 PM

## 2018-01-31 LAB — RENAL FUNCTION PANEL
Albumin: 2 g/dL — ABNORMAL LOW (ref 3.5–5.0)
Anion gap: 9 (ref 5–15)
BUN: 36 mg/dL — ABNORMAL HIGH (ref 8–23)
CO2: 29 mmol/L (ref 22–32)
Calcium: 8.7 mg/dL — ABNORMAL LOW (ref 8.9–10.3)
Chloride: 99 mmol/L (ref 98–111)
Creatinine, Ser: 1.32 mg/dL — ABNORMAL HIGH (ref 0.61–1.24)
GFR calc Af Amer: 57 mL/min — ABNORMAL LOW (ref >60)
GFR calc non Af Amer: 50 mL/min — ABNORMAL LOW (ref >60)
Glucose, Bld: 198 mg/dL — ABNORMAL HIGH (ref 70–99)
Phosphorus: 3.2 mg/dL (ref 2.5–4.6)
Potassium: 5 mmol/L (ref 3.5–5.1)
Sodium: 137 mmol/L (ref 135–145)

## 2018-01-31 LAB — GLUCOSE, CAPILLARY
Glucose-Capillary: 181 mg/dL — ABNORMAL HIGH (ref 70–99)
Glucose-Capillary: 172 mg/dL — ABNORMAL HIGH (ref 70–99)
Glucose-Capillary: 182 mg/dL — ABNORMAL HIGH (ref 70–99)
Glucose-Capillary: 222 mg/dL — ABNORMAL HIGH (ref 70–99)

## 2018-01-31 MED ORDER — ALPRAZOLAM 0.25 MG PO TABS
0.1250 mg | ORAL_TABLET | Freq: Two times a day (BID) | ORAL | Status: DC | PRN
  Administered 2018-01-31: 0.125 mg via ORAL
  Filled 2018-01-31: qty 1

## 2018-01-31 MED ORDER — PREDNISONE 20 MG PO TABS
20.0000 mg | ORAL_TABLET | Freq: Every day | ORAL | Status: DC
  Administered 2018-02-01: 20 mg via ORAL
  Filled 2018-01-31: qty 1

## 2018-01-31 MED ORDER — ACETAMINOPHEN 500 MG PO TABS
500.0000 mg | ORAL_TABLET | ORAL | Status: DC
  Administered 2018-01-31 – 2018-02-02 (×9): 500 mg via ORAL
  Filled 2018-01-31 (×11): qty 1

## 2018-01-31 MED ORDER — ACETAMINOPHEN 650 MG RE SUPP: 650.0000 mg | RECTAL | Status: DC

## 2018-01-31 NOTE — Progress Notes (Addendum)
PROGRESS NOTE    Douglas Monroe  NIO:[id number] DOB: 01-16-1933 DOA: 01/07/2018 PCP:   Brief Narrative: 84 year old male COPD, peripheral vascular disease status post iliac stenting Dr. Gwenlyn Found HTN, HLD, admitted 01/29/2018 with acute respiratory failure and repeat ED visit after outpatient trial of amoxicillin prednisone taper  On work-up initially found to have rectus sheath hematoma without extravasation felt secondary to severe coughing  Regarding his COPD exacerbation, he he has severe COPD, and he is a slowly improving.  He is still get significant shortness of breath on minimal exertion.  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 Alyson Ingles Normal cardiac cath 2004 Status post bilateral iliac stent placement as well as recannulization R SFA-also noted small AAA 3.8 cm-prior left renal artery stenting 11/02/2012 for progression of renal disease Admission 2/12-2/14/2018 with rectal bleeding found to have ischemic colitis which was self-limiting-was supposed to follow-up outpatient for the same   Assessment & Plan:   Principal Problem:   COPD with acute exacerbation ([place]) Active Problems:   PVD (peripheral vascular disease) with claudication, previous stents to bilateral iliacs. Rt. SFA also.    PVD S/P multiple PCIs   HTN (hypertension)   Abdominal aortic aneurysm (HCC)   S/P arterial stent, to Lt renal art. 11/02/12   Rectus sheath hematoma, initial encounter   Acute respiratory failure with hypoxia (HCC)  Acute COPD exacerbation, acute hypoxic respiratory failure; With component of restrictive deficit secondary to splinting from hematoma Discontinue ceftriaxone 12/28 Continue guaifenesin and codeine -on DUONEB, flonase, PRN albuterol.  -Continue with Xanax to  tid-->bid PRN, careful with oversedation.  -speech/swallow evaluation created- -Changed to prednisone 20 mg until 1/5  Rectus sheath hematoma with acute blood loss anemia; Presumed to be after a vigorous coughing spell Surgery consulted. Recommending abdominal binding and seems to be improved Continue to hold Plavix and would hold at least for 2 weeks until seen by PCP Monitor hb, blood transfusion as needed.  Prefer tylenol>tramadol   Peripheral vascular disease, with claudication previous stents to bilateral iliacs. Rt. SFA also.  Stent placement 2014. Follow with Dr. Alvester Chou. CT reviewed by Vascular, patient to follow up with Dr Alvester Chou.   Abdominal aortic aneurysm ([place]) Noted for slight increase in size to 3.5 cm.  Follow as outpatient.  BPH, bladder outlet obstruction on CT. Resume Flomax and finasteride.   Hypokalemia resolved.  HTN; no further low BP.  Hold Norvasc.   Hyperglycemia: In the setting of IV steroids. Continue with sliding scale insulin-170s to 180s  Abdominal distension:  [place] negative for ascites.  KUB negative for obstruction.  Likely related to abdominal wall hematoma.  Continue with abdominal binder.  Leukocytosis;  Could be related to steroids-now off antibiotics and will cut back steroids as above  CKD stage II; monitor renal function.  DVT prophylaxis: SCDs Code Status: Full code Family Communication: Son and grandson  Disposition Plan: Will depend on hospital course-was premorbidl;y able to climb stairs and do activity and had no needs for walker or oxygen.  Consultants:  Critical care/pulmonary team saw patient earlier.   Procedures:   None   Antimicrobials: Azithromycin, Ceftriaxone  Subjective: Awake alert slightly sleepy took off oxygen no distress pain is mild  Objective: Vitals:   01/31/18 0552 01/31/18 0603 01/31/18 0654 01/31/18 0658  BP:  133/60   (!) 134/91  Pulse: 91  96 82  Resp: (!) 24   20  Temp: 98.3 F (36.8 C)    97.7 F (36.5 C)  TempSrc: Oral   Oral  SpO2: 90% 92% 92% 100%  Weight:      Height:        Intake/Output Summary (Last 24 hours) at 01/31/2018 0751 Last data filed at 01/31/2018 0557 Gross per 24 hour  Intake 480 ml  Output 325 ml  Net 155 ml   Filed Weights   01/18/18 1849  Weight: 71 kg    Examination:  EOMI NCAT no icterus no pallor chest is clinically clear without added sounds Abdomen has hematoma which seems to have spread out patient has pooling in the lower abdominal wall No lower extremity edema Smile symmetric Power is 5/5 1 S2 holosystolic murmur  Data Reviewed: I have personally reviewed following labs and imaging studies   CBC: Recent Labs  Lab 01/26/18 0307 01/27/18 0551 01/28/18 0644 01/29/18 0757 01/30/18 0547  WBC 13.3* 16.5* 19.8* 21.8* 16.0*  NEUTROABS 12.2*  --   --   --  15.1*  HGB 10.2* 10.4* 10.4* 11.0* 10.9*  HCT 32.8* 34.2* 33.9* 36.3* 35.2*  MCV 92.7 94.0 93.6 95.3 94.6  PLT 168 163 145* 163 681*   Basic Metabolic Panel: Recent Labs  Lab 01/27/18 0551 01/28/18 0644 01/28/18 1126 01/29/18 0757 01/30/18 0547  NA 140 138 137 135 136  135  K 4.6 5.6* 4.5 5.0 5.3*  5.2*  CL 102 99 99 99 101  100  CO2 29 30 29 27 26  27   GLUCOSE 215* 180* 187* 158* 171*  171*  BUN 35* 32* 33* 30* 30*  30*  CREATININE 1.08 1.22 1.07 1.09 0.98  1.07  CALCIUM 8.5* 8.7* 8.7* 8.5* 8.4*  8.4*  MG  --   --   --   --  2.1  PHOS  --   --   --   --  4.0   GFR: Estimated Creatinine Clearance: 43.8 mL/min (by C-G formula based on SCr of 1.07 mg/dL). Liver Function Tests: Recent Labs  Lab 01/30/18 0547  ALBUMIN 2.0*   No results for input(s): LIPASE, AMYLASE in the last 168 hours. No results for input(s): AMMONIA in the last 168 hours. Coagulation Profile: No results for input(s): INR, PROTIME in the last 168 hours. Cardiac Enzymes: No results for input(s): CKTOTAL, CKMB, CKMBINDEX, TROPONINI in the last 168 hours. BNP (last 3  results) No results for input(s): PROBNP in the last 8760 hours. HbA1C: No results for input(s): HGBA1C in the last 72 hours. CBG: Recent Labs  Lab 01/30/18 0735 01/30/18 1153 01/30/18 1554 01/30/18 2019 01/31/18 0744  GLUCAP 143* 218* 224* 226* 181*   Lipid Profile: No results for input(s): CHOL, HDL, LDLCALC, TRIG, CHOLHDL, LDLDIRECT in the last 72 hours. Thyroid Function Tests: No results for input(s): TSH, T4TOTAL, FREET4, T3FREE, THYROIDAB in the last 72 hours. Anemia Panel: No results for input(s): VITAMINB12, FOLATE, FERRITIN, TIBC, IRON, RETICCTPCT in the last 72 hours. Sepsis Labs: Recent Labs  Lab 01/30/18 1203  PROCALCITON 0.78    No results found for this or any previous visit (from the past 240 hour(s)).       Radiology Studies: No results found.      Scheduled Meds: . amLODipine  5 mg Oral Daily  . atorvastatin  20 mg Oral Daily  . benzonatate  200 mg Oral TID  . chlorpheniramine-HYDROcodone  5 mL Oral Q12H  . cholecalciferol  2,000 Units Oral Daily  . famotidine  20 mg Oral Daily  . finasteride  5 mg Oral Daily  . fluticasone  1 spray Each Nare Daily  . guaiFENesin  600 mg Oral BID  . insulin aspart  0-9 Units Subcutaneous TID WC  . ipratropium-albuterol  3 mL Nebulization QID  . loratadine  10 mg Oral Daily  . magnesium oxide  400 mg Oral Daily  . mouth rinse  15 mL Mouth Rinse BID  . methocarbamol  500 mg Oral TID  . methylPREDNISolone (SOLU-MEDROL) injection  40 mg Intravenous Q12H  . nebivolol  10 mg Oral Daily  . pantoprazole  40 mg Oral BID AC  . senna-docusate  1 tablet Oral BID  . tamsulosin  0.4 mg Oral Daily   Continuous Infusions:    LOS: 14 days    Time spent: 35 minutes   Verneita Griffes, MD [facility] 7:51 AM  If 7PM-7AM, please contact night-coverage [email or web address] Password [email or web address] 01/31/2018, 7:51 AM

## 2018-01-31 NOTE — Clinical Social Work Note (Signed)
Clinical Social Work Assessment  Patient Details  Name: Douglas Monroe MRN: [id number] Date of Birth: March 16, 1932  Date of referral:  01/31/18               Reason for consult:  Facility Placement                Permission sought to share information with:  Family Supports Permission granted to share information::  Yes, Verbal Permission Granted  Name::     Leona Singleton, Christian, Grandson  Agency::     Relationship::     Contact Information:     Housing/Transportation Living arrangements for the past 2 months:  Single Family Home Source of Information:  Patient, Spouse, Other (Comment Required) Patient Interpreter Needed:  None Criminal Activity/Legal Involvement Pertinent to Current Situation/Hospitalization:  No - Comment as needed Significant Relationships:  Adult Children, Spouse Lives with:  Spouse Do you feel safe going back to the place where you live?  Yes Need for family participation in patient care:  Yes (Comment)  Care giving concerns:  Pt is a 84 yo male admitted 12/17 with large rectal sheath hematoma due to coughing and COPD exacerbation with acute respiratory failure with hypoxia.    Social Worker assessment / plan: LCSW consulted for SNF placement.   LCSW met at bedside with patient, spouse and grandson. Patient and family agreeable to SNF for rehab. According to patient and family, patient is independent at home. Patient currently requiring o2 and patient reports he does not use oxygen at baseline. Grandson expressed concerns about patients vitals and possibility of being discharged.   LCSW explained referral process. Patient and family expressed understanding. Patient will require preauthorization. Facility will need to start auth. LCSW explained that the holiday may delay authorization.   PLAN: SNF for rehab at dc.   Employment status:  Retired Nurse, adult PT Recommendations:  [place] / Referral to  community resources:     Patient/Family's Response to care:  Patient and family thankful for LCSW visit.   Patient/Family's Understanding of and Emotional Response to Diagnosis, Current Treatment, and Prognosis:  Patient and family realistic about patients current condition. Patient and family hope that patient can return as close to baseline as possible.   Emotional Assessment Appearance:  Appears stated age Attitude/Demeanor/Rapport:  Engaged Affect (typically observed):  Accepting, Calm, Pleasant Orientation:  Oriented to Self, Oriented to Place, Oriented to  Time, Oriented to Situation Alcohol / Substance use:  Not Applicable Psych involvement (Current and /or in the community):  No (Comment)  Discharge Needs  Concerns to be addressed:  No discharge needs identified Readmission within the last 30 days:  No Current discharge risk:  None Barriers to Discharge:  No SNF bed, Insurance Authorization, Continued Medical Work up   [employer], LCSW 01/31/2018, 3:02 PM

## 2018-02-01 ENCOUNTER — Inpatient Hospital Stay (HOSPITAL_COMMUNITY): Payer: Medicare Other

## 2018-02-01 DIAGNOSIS — S301XXA Contusion of abdominal wall, initial encounter: Secondary | ICD-10-CM

## 2018-02-01 DIAGNOSIS — Z9911 Dependence on respirator [ventilator] status: Secondary | ICD-10-CM

## 2018-02-01 DIAGNOSIS — Z959 Presence of cardiac and vascular implant and graft, unspecified: Secondary | ICD-10-CM

## 2018-02-01 DIAGNOSIS — J181 Lobar pneumonia, unspecified organism: Secondary | ICD-10-CM

## 2018-02-01 DIAGNOSIS — J189 Pneumonia, unspecified organism: Secondary | ICD-10-CM

## 2018-02-01 DIAGNOSIS — Z95820 Peripheral vascular angioplasty status with implants and grafts: Secondary | ICD-10-CM

## 2018-02-01 LAB — BLOOD GAS, ARTERIAL
Acid-Base Excess: 5.3 mmol/L — ABNORMAL HIGH (ref 0.0–2.0)
Acid-base deficit: 0.2 mmol/L (ref 0.0–2.0)
Acid-base deficit: 2.1 mmol/L — ABNORMAL HIGH (ref 0.0–2.0)
BICARBONATE: 28.5 mmol/L — AB (ref 20.0–28.0)
Bicarbonate: 28.4 mmol/L — ABNORMAL HIGH (ref 20.0–28.0)
Bicarbonate: 22.8 mmol/L (ref 20.0–28.0)
Drawn by: 308601
Drawn by: 270211
Drawn by: 270211
FIO2: 1
FIO2: 1
MECHVT: 370 mL
O2 Content: 3 L/min
O2 SAT: 96 %
O2 Saturation: 90 %
O2 Saturation: 96.7 %
PEEP: 10 cmH2O
PEEP: 10 cmH2O
PH ART: 7.229 — AB (ref 7.350–7.450)
Patient temperature: 98.6
Patient temperature: 98.6
Patient temperature: 98.6
RATE: 15 resp/min
RATE: 18 {breaths}/min
VT: 370 mL
pCO2 arterial: 37.7 mmHg (ref 32.0–48.0)
pCO2 arterial: 70.4 mmHg (ref 32.0–48.0)
pCO2 arterial: 42.2 mmHg (ref 32.0–48.0)
pH, Arterial: 7.491 — ABNORMAL HIGH (ref 7.350–7.450)
pH, Arterial: 7.351 (ref 7.350–7.450)
pO2, Arterial: 58.1 mmHg — ABNORMAL LOW (ref 83.0–108.0)
pO2, Arterial: 110 mmHg — ABNORMAL HIGH (ref 83.0–108.0)
pO2, Arterial: 93.3 mmHg (ref 83.0–108.0)

## 2018-02-01 LAB — CBC
HCT: 38.6 % — ABNORMAL LOW (ref 39.0–52.0)
Hemoglobin: 11.8 g/dL — ABNORMAL LOW (ref 13.0–17.0)
MCH: 28.9 pg (ref 26.0–34.0)
MCHC: 30.6 g/dL (ref 30.0–36.0)
MCV: 94.4 fL (ref 80.0–100.0)
Platelets: 95 10*3/uL — ABNORMAL LOW (ref 150–400)
RBC: 4.09 MIL/uL — ABNORMAL LOW (ref 4.22–5.81)
RDW: 16.4 % — ABNORMAL HIGH (ref 11.5–15.5)
WBC: 7.3 10*3/uL (ref 4.0–10.5)
nRBC: 0 % (ref 0.0–0.2)

## 2018-02-01 LAB — BODY FLUID CELL COUNT WITH DIFFERENTIAL
Monocyte-Macrophage-Serous Fluid: 6 % — ABNORMAL LOW (ref 50–90)
Neutrophil Count, Fluid: 94 % — ABNORMAL HIGH (ref 0–25)
Total Nucleated Cell Count, Fluid: 3600 cu mm — ABNORMAL HIGH (ref 0–1000)

## 2018-02-01 LAB — RENAL FUNCTION PANEL
Albumin: 1.7 g/dL — ABNORMAL LOW (ref 3.5–5.0)
Anion gap: 11 (ref 5–15)
BUN: 46 mg/dL — ABNORMAL HIGH (ref 8–23)
CO2: 27 mmol/L (ref 22–32)
Calcium: 8.9 mg/dL (ref 8.9–10.3)
Chloride: 102 mmol/L (ref 98–111)
Creatinine, Ser: 1.33 mg/dL — ABNORMAL HIGH (ref 0.61–1.24)
GFR calc Af Amer: 57 mL/min — ABNORMAL LOW (ref >60)
GFR calc non Af Amer: 49 mL/min — ABNORMAL LOW (ref >60)
Glucose, Bld: 190 mg/dL — ABNORMAL HIGH (ref 70–99)
Phosphorus: 3.3 mg/dL (ref 2.5–4.6)
Potassium: 5.3 mmol/L — ABNORMAL HIGH (ref 3.5–5.1)
Sodium: 140 mmol/L (ref 135–145)

## 2018-02-01 LAB — MAGNESIUM
Magnesium: 2.3 mg/dL (ref 1.7–2.4)
Magnesium: 2.2 mg/dL (ref 1.7–2.4)

## 2018-02-01 LAB — GLUCOSE, CAPILLARY
Glucose-Capillary: 199 mg/dL — ABNORMAL HIGH (ref 70–99)
Glucose-Capillary: 121 mg/dL — ABNORMAL HIGH (ref 70–99)
Glucose-Capillary: 121 mg/dL — ABNORMAL HIGH (ref 70–99)
Glucose-Capillary: 127 mg/dL — ABNORMAL HIGH (ref 70–99)
Glucose-Capillary: 132 mg/dL — ABNORMAL HIGH (ref 70–99)

## 2018-02-01 LAB — CREATININE, SERUM
Creatinine, Ser: 1.56 mg/dL — ABNORMAL HIGH (ref 0.61–1.24)
GFR calc Af Amer: 47 mL/min — ABNORMAL LOW
GFR calc non Af Amer: 40 mL/min — ABNORMAL LOW

## 2018-02-01 LAB — PHOSPHORUS
Phosphorus: 3.6 mg/dL (ref 2.5–4.6)
Phosphorus: 5.6 mg/dL — ABNORMAL HIGH (ref 2.5–4.6)

## 2018-02-01 MED ORDER — PRO-STAT SUGAR FREE PO LIQD
30.0000 mL | Freq: Two times a day (BID) | ORAL | Status: DC
  Administered 2018-02-01 (×2): 30 mL
  Filled 2018-02-01 (×3): qty 30

## 2018-02-01 MED ORDER — MORPHINE SULFATE (PF) 2 MG/ML IV SOLN
0.5000 mg | INTRAVENOUS | Status: DC | PRN
  Administered 2018-02-01: 0.5 mg via INTRAVENOUS
  Filled 2018-02-01: qty 1

## 2018-02-01 MED ORDER — ETOMIDATE 2 MG/ML IV SOLN
20.0000 mg | Freq: Once | INTRAVENOUS | Status: AC
  Administered 2018-02-01: 20 mg via INTRAVENOUS

## 2018-02-01 MED ORDER — DEXMEDETOMIDINE HCL IN NACL 200 MCG/50ML IV SOLN
0.0000 ug/kg/h | INTRAVENOUS | Status: DC
  Administered 2018-02-01: 1.2 ug/kg/h via INTRAVENOUS
  Administered 2018-02-01: 0.4 ug/kg/h via INTRAVENOUS
  Administered 2018-02-01: 1.2 ug/kg/h via INTRAVENOUS
  Administered 2018-02-01: 0.4 ug/kg/h via INTRAVENOUS
  Filled 2018-02-01 (×4): qty 50

## 2018-02-01 MED ORDER — ORAL CARE MOUTH RINSE
15.0000 mL | OROMUCOSAL | Status: DC
  Administered 2018-02-02: 15 mL via OROMUCOSAL

## 2018-02-01 MED ORDER — MORPHINE SULFATE (PF) 2 MG/ML IV SOLN
INTRAVENOUS | Status: AC
  Filled 2018-02-01: qty 1

## 2018-02-01 MED ORDER — FENTANYL CITRATE (PF) 100 MCG/2ML IJ SOLN
INTRAMUSCULAR | Status: AC
  Administered 2018-02-01: 50 ug
  Filled 2018-02-01: qty 4

## 2018-02-01 MED ORDER — FENTANYL CITRATE (PF) 100 MCG/2ML IJ SOLN
50.0000 ug | INTRAMUSCULAR | Status: AC | PRN
  Administered 2018-02-01 (×3): 50 ug via INTRAVENOUS
  Filled 2018-02-01 (×3): qty 2

## 2018-02-01 MED ORDER — VITAL HIGH PROTEIN PO LIQD
1000.0000 mL | ORAL | Status: DC
  Administered 2018-02-01: 1000 mL

## 2018-02-01 MED ORDER — PHENYLEPHRINE HCL-NACL 10-0.9 MG/250ML-% IV SOLN
0.0000 ug/min | INTRAVENOUS | Status: DC
  Administered 2018-02-01: 20 ug/min via INTRAVENOUS
  Administered 2018-02-01: 375 ug/min via INTRAVENOUS
  Administered 2018-02-01: 20 ug/min via INTRAVENOUS
  Administered 2018-02-01: 375 ug/min via INTRAVENOUS
  Administered 2018-02-01: 90 ug/min via INTRAVENOUS
  Administered 2018-02-01: 200 ug/min via INTRAVENOUS
  Administered 2018-02-01 (×3): 375 ug/min via INTRAVENOUS
  Administered 2018-02-01: 100 ug/min via INTRAVENOUS
  Administered 2018-02-02 (×4): 375 ug/min via INTRAVENOUS
  Filled 2018-02-01: qty 500
  Filled 2018-02-01: qty 250
  Filled 2018-02-01: qty 500
  Filled 2018-02-01 (×6): qty 250
  Filled 2018-02-01: qty 500
  Filled 2018-02-01 (×2): qty 250

## 2018-02-01 MED ORDER — MIDAZOLAM HCL 2 MG/2ML IJ SOLN
1.0000 mg | INTRAMUSCULAR | Status: DC | PRN
  Administered 2018-02-01 (×2): 1 mg via INTRAVENOUS
  Filled 2018-02-01 (×2): qty 2

## 2018-02-01 MED ORDER — FENTANYL 2500MCG IN NS 250ML (10MCG/ML) PREMIX INFUSION
25.0000 ug/h | INTRAVENOUS | Status: DC
  Administered 2018-02-01: 25 ug/h via INTRAVENOUS
  Filled 2018-02-01 (×2): qty 250

## 2018-02-01 MED ORDER — FENTANYL CITRATE (PF) 100 MCG/2ML IJ SOLN
50.0000 ug | INTRAMUSCULAR | Status: DC | PRN
  Filled 2018-02-01: qty 2

## 2018-02-01 MED ORDER — PHENYLEPHRINE HCL-NACL 10-0.9 MG/250ML-% IV SOLN: 0.0000 ug/min | INTRAVENOUS | Status: DC

## 2018-02-01 MED ORDER — SODIUM CHLORIDE 0.9 % IV SOLN
3.0000 g | Freq: Three times a day (TID) | INTRAVENOUS | Status: DC
  Administered 2018-02-01 (×2): 3 g via INTRAVENOUS
  Filled 2018-02-01 (×4): qty 3

## 2018-02-01 MED ORDER — METHYLPREDNISOLONE SODIUM SUCC 125 MG IJ SOLR
60.0000 mg | Freq: Two times a day (BID) | INTRAMUSCULAR | Status: DC
  Administered 2018-02-01 (×2): 60 mg via INTRAVENOUS
  Filled 2018-02-01 (×3): qty 2

## 2018-02-01 MED ORDER — HEPARIN SODIUM (PORCINE) 5000 UNIT/ML IJ SOLN
5000.0000 [IU] | Freq: Three times a day (TID) | INTRAMUSCULAR | Status: DC
  Administered 2018-02-01 (×2): 5000 [IU] via SUBCUTANEOUS
  Filled 2018-02-01 (×3): qty 1

## 2018-02-01 MED ORDER — SODIUM CHLORIDE 0.9 % IV BOLUS
1000.0000 mL | Freq: Once | INTRAVENOUS | Status: AC
  Administered 2018-02-01: 1000 mL via INTRAVENOUS

## 2018-02-01 MED ORDER — MIDAZOLAM HCL 2 MG/2ML IJ SOLN
INTRAMUSCULAR | Status: AC
  Administered 2018-02-02: 1 mg
  Filled 2018-02-01: qty 2

## 2018-02-01 MED ORDER — ROCURONIUM BROMIDE 50 MG/5ML IV SOLN
50.0000 mg | Freq: Once | INTRAVENOUS | Status: AC
  Administered 2018-02-01: 50 mg via INTRAVENOUS
  Filled 2018-02-01: qty 5

## 2018-02-01 MED ORDER — MIDAZOLAM HCL 2 MG/2ML IJ SOLN
INTRAMUSCULAR | Status: AC
  Administered 2018-02-01: 1 mg
  Filled 2018-02-01: qty 4

## 2018-02-01 MED ORDER — CHLORHEXIDINE GLUCONATE 0.12% ORAL RINSE (MEDLINE KIT)
15.0000 mL | Freq: Two times a day (BID) | OROMUCOSAL | Status: DC
  Administered 2018-02-01: 15 mL via OROMUCOSAL

## 2018-02-01 NOTE — Progress Notes (Signed)
Pharmacy Antibiotic Note  Douglas Monroe is a 85 y.o. male admitted on 01/31/2018 with acute respiratory failure.  Pharmacy has been consulted for Unasyn dosing for aspiration PNA.  AF. SCr 1.33. 1/1 CXR: New 11 cm masslike opacity in the right upper lobe with abnormal right hilar density, possibly round pneumonia and lymphadenopathy given rapid development and worsening left upper lobe infiltrate  Plan: Unasyn 3 gm IV q8h  Height: 5\' 4"  (162.6 cm) Weight: 156 lb 8.4 oz (71 kg) IBW/kg (Calculated) : 59.2  Temp (24hrs), Avg:98.4 F (36.9 C), Min:98.2 F (36.8 C), Max:98.6 F (37 C)  Recent Labs  Lab 01/26/18 0307 01/27/18 0551 01/28/18 0644 01/28/18 1126 01/29/18 0757 01/30/18 0547 01/31/18 0726 02/01/18 0606  WBC 13.3* 16.5* 19.8*  --  21.8* 16.0*  --   --   CREATININE 1.02 1.08 1.22 1.07 1.09 0.98  1.07 1.32* 1.33*    Estimated Creatinine Clearance: 35.2 mL/min (A) (by C-G formula based on SCr of 1.33 mg/dL (H)).    Allergies  Allergen Reactions  . Meat Extract Other (See Comments)    Alpha gal, red meat allergy   . Ativan [Lorazepam]     Hallucination   . Pneumococcal Vaccines Swelling and Other (See Comments)    Lip swelling    Antimicrobials this admission: 12/17 doxy >> 12/20 12/20 cefdinir >>12/22 12/22 CTX>>12/28 12/20 azithro >12/28 1/1 Unasyn >>  Dose adjustments this admission:  Microbiology results: 12/20 MRSA PCR neg  12/19 resp panel neg Thank you for allowing pharmacy to be a part of this patient's care. Eudelia Bunch, Pharm.D [phone] 02/01/2018 8:58 AM

## 2018-02-01 NOTE — Progress Notes (Signed)
ABG and Chest x-results discussed with Dr. Verlon Au, after ongoing shortness of breath.  Dr. Verlon Au requested that patient not eat or drink anything, due to concerns of aspiration pneumonia.  Dr. Verlon Au came to floor and requested patient be transferred to stepdown unit.  Report given to [facility] in ICU.

## 2018-02-01 NOTE — Progress Notes (Signed)
Called by nursing regarding increased work of breathing as well as worsening O2 sats Long discussion with family patient and patient's grandson who is a Marine scientist here at the [facility] ICU I called Dr. Valeta Harms and it was decided that patient may require intubation and hence we will stay in the background and allow critical care to manage as attending on this patient but we will be available to pick back up his care should the patient overcome his illness  I spent over 1 hour in direct patient care at the bedside with the patient and on the ICU discussing plan of care and formulating a plan

## 2018-02-01 NOTE — Consult Note (Signed)
NAME:  Douglas Monroe, MRN:  [id number], DOB:  11/08/1932, LOS: 59 ADMISSION DATE:  01/12/2018, CONSULTATION DATE:  02/01/2018  REFERRING MD:  Dr. Verlon Au, CHIEF COMPLAINT:  Acute resp failure, RUL PNA    Brief History   85 year old gentleman admitted for right upper lobe pneumonia to the ICU requiring intubation mechanical ventilation.  History of present illness   This 85 year old gentleman with a past medical history of severe COPD, FEV1 1.93 L postbronchodilator response on most recent PFTs.  Patient of Dr. Vaughan Browner.  History of peripheral vascular disease extremity colic [place] follow-up with Dr. Alvester Chou, hypertension hyperlipidemia.  Presented to his PCP initially with nonproductive cough had URI symptoms.  Was treated with amoxicillin prednisone taper and cough suppressant.  Ultimately was admitted to the emergency room and admitted to the hospital floor.  He was placed on antibiotics and breathing treatments.  On 02/01/2018 the patient was transferred to the intensive care unit requiring BiPAP with respiratory rate in the 40s on 80% FiO2.  Discussion with the family regarding intubation occurred.  Decision for mechanical ventilation was made.  Please see separate procedure note regarding intubation and mechanical ventilation.  Of note was found to have a abdominal sheath hematoma without extravasation.   Past Medical History   Past Medical History:  Diagnosis Date  . Abdominal aortic aneurysm ([place])    infrarenal -- monitored by dr berry (note states stable 4.0 to 4.1cm)  . Allergy to alpha-gal    per aptient and wife; reports he was diagnosed in spring 2019 after a bite form possibly a chigger or tick ;    . Anxiety    situational  . Arthritis    "hands" (03/15/2016)  . Cancer of skin of temple 2017   right  . COPD with emphysema ([place])   . Dyspnea    with exertion  . Exertional dyspnea   . First degree heart block   . GERD (gastroesophageal reflux disease)   . Headache(784.0)    uses  goody powder; "maybe weekly, maybe not that much" (03/15/2016)  . History of kidney stones   . HOH (hard of hearing)    left ear; no hearing aid   . HTN (hypertension) 08/20/2011  . Hyperlipidemia   . Hypertension   . Ischemic colitis ([place])   . Left carotid artery stenosis    moderate  . Migraine    "long time ago" (03/15/2016)  . 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 PVD (peripheral vascular disease) with claudication [facility]) cardiologist-  dr berry   s/p bilateral iliac stents and right sfa stenting/  doppper study 09-05-2012  revealed ABIs close to one bilaterally with patent stents/  stenting left renal artery stenosis   . Right ureteral stone   . S/P arterial stent    left renal angioplasty and stent 11-02-2012  . Sleep apnea    at risk for OSA. stop/bang score= 6; sent to PCP 10/04/13; denies use of mask on 03/15/2016  . Tubular adenoma of colon   . Urticaria      Significant Hospital Events   ICU admission 02/01/2018  Consults:  02/01/2018: Pulmonary critical care  Procedures:  02/01/2018: Endotracheal tube placement  Significant Diagnostic Tests:   Micro Data:  Respiratory cultures 02/01/2018: Pending  Antimicrobials:  Unasyn  Interim history/subjective:  Discussed intubation with the patient prior.  Patient is agreeable.  Consent obtained from son, wife as well as grandson who is ICU charge nurse as of today.  Objective   Blood pressure 137/69, pulse (!) 127, temperature 97.6 F (36.4 C), temperature source Axillary, resp. rate (!) 37, height _0  (1.626 m), weight 71 kg, SpO2 96 %.        Intake/Output Summary (Last 24 hours) at 02/01/2018 1031 Last data filed at 01/31/2018 2300 Gross per 24 hour  Intake 237 ml  Output 575 ml  Net -338 ml   Filed Weights   01/18/18 1849  Weight: 71 kg    Examination: General: Elderly male, acute respiratory distress, tachypneic, slightly diaphoretic HENT: NCAT, sclera clear, no dentures Lungs:  Rhonchi in the right upper lobe diminished in the apex, diminished breath sounds bilaterally poor air movement on BiPAP Cardiovascular: Cardiac, regular, S1-S2 Abdomen: Soft, nontender, nondistended does have abdominal breathing pattern Extremities: No significant lower extremity edema Skin: Mottled along the anterior medial thigh Neuro: Alert, oriented, following commands, no focal deficit  Resolved Hospital Problem list    Assessment & Plan:   Acute hypoxemic respiratory failure requiring mechanical ventilation, acute exacerbation of underlying chronic obstructive pulmonary disease, significant right upper lobe pneumonia, unknown organism at this time at risk for aspiration in the hospital setting. -Chest x-ray with a little concern of right upper lobe collapse, significant consolidation developed since imaging on the 26th. -The patient's images have been independently reviewed by me.   -We will likely proceed with CT imaging once stabilized. - Bedside bronchoscopy completed with no evidence of right upper lobe endobronchial lesion.  Cultures sent BAL returned with significant purulence. -Cultures and BAL cell count pending -Sedation with fentanyl and precedex  Severe COPD  -Continue bronchodilators - IV steroids, Solu-Medrol started  Severe sepsis, with respiratory failure secondary to above - will recheck lactic acid  Hypotension - following intubation  - likely medication effect and loss of tone from paralysis  - IVF fluid bolus and will recheck   Hyperkalemia Potassium 5.3 on this morning's BMP -Will repeat BMP  Acute renal failure likely secondary to above -Mild elevation in serum creatinine from baseline -We will continue to observe with repeat BMP -No indication for dialysis  Positive RAST Elevated IgE, 761 back in April 2019   Best practice:  Diet: TF  Pain/Anxiety/Delirium protocol (if indicated): Yes  VAP protocol (if indicated): Yes  DVT prophylaxis:  Heparin  GI prophylaxis: PPI  Glucose control: SSI  Mobility: Bedrest  Code Status: FULL  Family Communication: Family, wife and son at bedside  Disposition: ICU level care   Labs   CBC: Recent Labs  Lab 01/26/18 0307 01/27/18 0551 01/28/18 0644 01/29/18 0757 01/30/18 0547  WBC 13.3* 16.5* 19.8* 21.8* 16.0*  NEUTROABS 12.2*  --   --   --  15.1*  HGB 10.2* 10.4* 10.4* 11.0* 10.9*  HCT 32.8* 34.2* 33.9* 36.3* 35.2*  MCV 92.7 94.0 93.6 95.3 94.6  PLT 168 163 145* 163 126*    Basic Metabolic Panel: Recent Labs  Lab 01/28/18 1126 01/29/18 0757 01/30/18 0547 01/31/18 0726 02/01/18 0606  NA 137 135 136  135 137 140  K 4.5 5.0 5.3*  5.2* 5.0 5.3*  CL 99 99 101  100 99 102  CO2 _1 GLUCOSE 187* 158* 171*  171* 198* 190*  BUN 33* 30* 30*  30* 36* 46*  CREATININE 1.07 1.09 0.98  1.07 1.32* 1.33*  CALCIUM 8.7* 8.5* 8.4*  8.4* 8.7* 8.9  MG  --   --  2.1  --   --   PHOS  --   --  4.0 3.2 3.3   GFR: Estimated Creatinine Clearance: 35.2 mL/min (A) (by C-G formula based on SCr of 1.33 mg/dL (H)). Recent Labs  Lab 01/27/18 0551 01/28/18 0644 01/29/18 0757 01/30/18 0547 01/30/18 1203  PROCALCITON  --   --   --   --  0.78  WBC 16.5* 19.8* 21.8* 16.0*  --     Liver Function Tests: Recent Labs  Lab 01/30/18 0547 01/31/18 0726 02/01/18 0606  ALBUMIN 2.0* 2.0* 1.7*   No results for input(s): LIPASE, AMYLASE in the last 168 hours. No results for input(s): AMMONIA in the last 168 hours.  ABG    Component Value Date/Time   PHART 7.491 (H) 02/01/2018 0650   PCO2ART 37.7 02/01/2018 0650   PO2ART 58.1 (L) 02/01/2018 0650   HCO3 28.5 (H) 02/01/2018 0650   TCO2 25 08/20/2016 0351   O2SAT 90.0 02/01/2018 0650     Coagulation Profile: No results for input(s): INR, PROTIME in the last 168 hours.  Cardiac Enzymes: No results for input(s): CKTOTAL, CKMB, CKMBINDEX, TROPONINI in the last 168 hours.  HbA1C: Hgb A1c MFr Bld  Date/Time Value Ref  Range Status  11/08/2017 12:04 PM 6.7 (H) 4.8 - 5.6 % Final    Comment:             Prediabetes: 5.7 - 6.4          Diabetes: >6.4          Glycemic control for adults with diabetes: <7.0   05/02/2017 6.4 (A) 4.0 - 6.0 Final    CBG: Recent Labs  Lab 01/31/18 0744 01/31/18 1140 01/31/18 1608 01/31/18 2023 02/01/18 0758  GLUCAP 181* 172* 182* 222* 199*    Review of Systems:   Unable to obtain due to critical illness   Past Medical History  He,  has a past medical history of Abdominal aortic aneurysm ([place]), Allergy to alpha-gal, Anxiety, Arthritis, Cancer of skin of temple (2017), COPD with emphysema ([place]), Dyspnea, Exertional dyspnea, First degree heart block, GERD (gastroesophageal reflux disease), Headache(784.0), History of kidney stones, HOH (hard of hearing), HTN (hypertension) (08/20/2011), Hyperlipidemia, Hypertension, Ischemic colitis ([place]), Left carotid artery stenosis, Migraine, Occlusion of right vertebral artery without cerebral infarction, PVD (peripheral vascular disease) with claudication ([place]) (cardiologist-  dr berry), Right ureteral stone, S/P arterial stent, Sleep apnea, Tubular adenoma of colon, and Urticaria.   Surgical History    Past Surgical History:  Procedure Laterality Date  . ABDOMINAL ANGIOGRAM  08/19/2011   Left common iliac artery at the ostium, 6x21m ICAST covered stent, common femoral artery, 8x1022mAbbott absolute pro nitinol self-expanding stent and resulting in reduction of 80 and 90% stenosis to 0% residual  . ABDOMINAL ANGIOGRAM  10/08/2010   Mid-right SFA, 6x120 EV3 Protege nitinol self-expanding stent, resulting in reduction of CTO to 0% residual  . ABDOMINAL ANGIOGRAM  06/22/2010   Rt Common Femoral Artery-8x3 Absolute Pro Nitinol self-expanding stent-90% stenosis to 0% residual; Rt External Iliac Artery-9x3 Absolute Pro Nitinol self-expanding stent-70% stenosis to 0% residual; Left External Iliac Artery-8x4 Absolute Pro Nitinol self-expanding  stent-90% to 0% residual  . ATHERECTOMY N/A 08/19/2011   Procedure: ATHERECTOMY;  Surgeon: JoLorretta HarpMD;  [facility];  Service: Cardiovascular;  Laterality: N/A;  . BACK SURGERY    . CARDIAC CATHETERIZATION  10/26/2002  dr mcTami Ribas normal coronary arteries/ normal  LVF  . CARDIOVASCULAR STRESS TEST  06-12-2010   dr berry   normal perfusion study/ no ischemia/  ef 69%  .  COLONOSCOPY WITH PROPOFOL N/A 03/19/2016   Procedure: COLONOSCOPY WITH PROPOFOL;  Surgeon: Mauri Pole, MD;  Location: [facility];  Service: Endoscopy;  Laterality: N/A;  was inpatient, but got d/c'd home and is coming back as an OP  . CYSTOSCOPY W/ URETERAL STENT PLACEMENT Left 06/07/2016   Procedure: CYSTOSCOPY WITH RETROGRADE PYELOGRAM/URETERAL STENT REPLACEMENT;  Surgeon: Cleon Gustin, MD;  Location: [facility];  Service: Urology;  Laterality: Left;  . CYSTOSCOPY W/ URETERAL STENT PLACEMENT Left 09/20/2016   Procedure: CYSTOSCOPY WITH RETROGRADE PYELOGRAM/URETERAL STENT PLACEMENT;  Surgeon: Cleon Gustin, MD;  Location: [facility];  Service: Urology;  Laterality: Left;  . CYSTOSCOPY WITH RETROGRADE PYELOGRAM, URETEROSCOPY AND STENT PLACEMENT Right 10/10/2013   Procedure: uretheral dilation, CYSTOSCOPY, URETEROSCOPY, stone extraction, STENT PLACEMENT;  Surgeon: [name], MD;  Location: [facility];  Service: Urology;  Laterality: Right;  . CYSTOSCOPY WITH RETROGRADE PYELOGRAM, URETEROSCOPY AND STENT PLACEMENT Left 02/08/2017   Procedure: CYSTOSCOPY WITH RETROGRADE PYELOGRAM, URETEROSCOPY;  Surgeon: Cleon Gustin, MD;  Location: [facility];  Service: Urology;  Laterality: Left;  . CYSTOSCOPY WITH RETROGRADE PYELOGRAM, URETEROSCOPY AND STENT PLACEMENT Left 05/16/2017   Procedure: CYSTOSCOPY WITH RETROGRADE PYELOGRAM, URETEROSCOPY WITH ABLATION OF URETERAL TUMOR, BLADDER BIOPSY AND FULGURATION, AND STENT PLACEMENT;  Surgeon: Cleon Gustin, MD;  Location: [facility];  Service: Urology;   Laterality: Left;  . CYSTOSCOPY WITH RETROGRADE PYELOGRAM, URETEROSCOPY AND STENT PLACEMENT Left 06/13/2017   Procedure: CYSTOSCOPY WITH RETROGRADE PYELOGRAM, URETEROSCOPY STENT EXCHANGE;  Surgeon: Cleon Gustin, MD;  Location: [facility];  Service: Urology;  Laterality: Left;  . CYSTOSCOPY WITH RETROGRADE PYELOGRAM, URETEROSCOPY AND STENT PLACEMENT Left 10/31/2017   Procedure: CYSTOSCOPY WITH RETROGRADE PYELOGRAM, URETEROSCOPY ,FULGURATION OF URETERAL TUMOR AND STENT PLACEMENT;  Surgeon: Cleon Gustin, MD;  Location: [facility];  Service: Urology;  Laterality: Left;  1 HR POSSIBLE STENT PLACEMENT, ALSO POSSIBLE TUMOR ABLATION  . CYSTOSCOPY WITH URETEROSCOPY Left 05/09/2016   Procedure: CYSTOSCOPY, RETROGRADE, LEFT URETEROSCOPY, LEFT URETERAL BIOPSIES, HOLMIUM LASER FULGERATION, LEFT URETERAL STENT PLACEMENT;  Surgeon: Irine Seal, MD;  Location: [facility];  Service: Urology;  Laterality: Left;  . CYSTOSCOPY/URETEROSCOPY/HOLMIUM LASER Left 09/20/2016   Procedure: CYSTOSCOPY/URETEROSCOPY/HOLMIUM LASER;  Surgeon: Cleon Gustin, MD;  Location: [facility];  Service: Urology;  Laterality: Left;  . HOLMIUM LASER APPLICATION Right 07/09/5914   Procedure: HOLMIUM LASER APPLICATION;  Surgeon: [name], MD;  Location[facility];  Service: Urology;  Laterality: Right;  . HOLMIUM LASER APPLICATION Left 04/09/4663   Procedure: TUMOR ABLATION WITH HOLMIUM LASER;  Surgeon: Cleon Gustin, MD;  Location: [facility];  Service: Urology;  Laterality: Left;  . LUMBAR DISC SURGERY  1970's  . RENAL ANGIOGRAM N/A 11/02/2012   Procedure: RENAL ANGIOGRAM;  Surgeon: Lorretta Harp, MD;  [facility];  Service: Cardiovascular;  Laterality: N/A;  . RENAL ARTERY STENT Left 11/02/2012  . SKIN CANCER EXCISION Right 2017   temple  . SOFT TISSUE CYST EXCISION  1960's   "outside part of lung LLL"  . TRANSURETHRAL RESECTION OF BLADDER TUMOR N/A 06/13/2017   Procedure: TRANSURETHRAL RESECTION OF BLADDER  TUMOR (TURBT);  Surgeon: Cleon Gustin, MD;  Location: [facility];  Service: Urology;  Laterality: N/A;     Social History   reports that he quit smoking about 7 years ago. His smoking use included cigarettes. He has a 60.00 pack-year smoking history. He has never used smokeless tobacco. He reports that he does not drink alcohol or  use drugs.   Family History   His family history includes Cancer in his father; Diabetes in his brother; Hypertension in his mother and son.   Allergies Allergies  Allergen Reactions  . Meat Extract Other (See Comments)    Alpha gal, red meat allergy   . Ativan [Lorazepam]     Hallucination   . Pneumococcal Vaccines Swelling and Other (See Comments)    Lip swelling     Home Medications  Prior to Admission medications   Medication Sig Start Date End Date Taking? Authorizing Provider  albuterol (PROVENTIL HFA;VENTOLIN HFA) 108 (90 Base) MCG/ACT inhaler Inhale 2 puffs into the lungs every 6 (six) hours as needed for wheezing or shortness of breath.   Yes [provider]  albuterol (PROVENTIL) (2.5 MG/3ML) 0.083% nebulizer solution Take 2.5 mg by nebulization every 6 (six) hours as needed for wheezing or shortness of breath.   Yes [provider]  amLODipine (NORVASC) 5 MG tablet TAKE 1 TABLET BY MOUTH  DAILY 01/16/18  Yes [place] Chard, MD  Aspirin-Acetaminophen-Caffeine (GOODY HEADACHE PO) Take 1 packet by mouth daily as needed (for headache).   Yes [provider]  atorvastatin (LIPITOR) 20 MG tablet TAKE 1 TABLET BY MOUTH  DAILY AT 6 PM. Patient taking differently: Take 20 mg by mouth every morning. Patient takes in the morning 07/13/17  Yes Lorretta Harp, MD  BYSTOLIC 10 MG tablet Take 10 mg by mouth daily. 02/23/16  Yes [provider]  chlorpheniramine-HYDROcodone (TUSSIONEX PENNKINETIC ER) 10-8 MG/5ML SUER Take 5 mLs by mouth every 12 (twelve) hours as needed for cough. 01/11/18  Yes Asencion Noble, MD    clopidogrel (PLAVIX) 75 MG tablet TAKE 1 TABLET BY MOUTH  EVERY DAY 07/13/17  Yes Lorretta Harp, MD  [employer] D3 2000 units CAPS Take 2,000 Units by mouth daily.  01/31/16  Yes [provider]  [employer]S MAGNESIUM OXIDE 500 MG TABS TAKE 1 TABLET BY MOUTH EVERY DAY 11/21/17  Yes [place] Chard, MD  finasteride (PROSCAR) 5 MG tablet Take 5 mg by mouth daily.    Yes [provider]  Fluticasone-Umeclidin-Vilant (TRELEGY ELLIPTA) 100-62.5-25 MCG/INH AEPB Inhale 1 puff into the lungs daily. 02/22/17  Yes Mannam, Praveen, MD  loratadine (CLARITIN) 10 MG tablet Take 10 mg by mouth daily.   Yes [provider]  pantoprazole (PROTONIX) 40 MG tablet TAKE 1 TABLET BY MOUTH EVERY DAY 11/04/17  Yes [place] Chard, MD  ranitidine (ZANTAC) 150 MG tablet TAKE 1 TABLET BY MOUTH TWICE A DAY 11/21/17  Yes [place] Chard, MD  tamsulosin (FLOMAX) 0.4 MG CAPS capsule Take 0.4 mg by mouth daily.  07/13/13  Yes [provider]  tiotropium (SPIRIVA) 18 MCG inhalation capsule Place 18 mcg into inhaler and inhale daily.   Yes [provider]  amoxicillin (AMOXIL) 875 MG tablet Take 1 tablet (875 mg total) by mouth 2 (two) times daily. Patient not taking: Reported on 01/23/2018 01/09/18   Minette Brine, FNP  EPINEPHrine 0.3 mg/0.3 mL IJ SOAJ injection Inject 0.3 mg into the muscle daily as needed (allergic reaction).  05/17/17   [provider]  predniSONE (STERAPRED UNI-PAK 21 TAB) 10 MG (21) TBPK tablet Take as directed Patient not taking: Reported on 01/02/2018 01/09/18   Minette Brine, FNP    This patient is critically ill with multiple organ system failure; which, requires frequent high complexity decision making, assessment, support, evaluation, and titration of therapies. This was completed through the application of advanced monitoring  technologies and extensive interpretation of multiple databases. During this encounter critical care time was devoted to patient care services  described in this note for 44 minutes.   Garner Nash, DO [facility] 02/01/2018 10:31 AM  Personal pager: [phone] If unanswered, please page CCM On-call: [phone]

## 2018-02-01 NOTE — Progress Notes (Signed)
PCCM:  Paged for RR from floor to ICU. 85 yo male in Acute resp failure, RUL pneumonia, FEV1 0.93 post-BD, Severe COPD. CXR w/ RUL pneumonia and possible collapse.   Plans for intubation and mechanical ventilation. Will bronch following intubation for evaluation of the RUL opening and send cultures while there. Possible aspiration event.   Full note to follow   Consent obtained from family at bedside.   Garner Nash, DO [facility] 02/01/2018 10:30 AM

## 2018-02-01 NOTE — Progress Notes (Signed)
Pt. pulled tx. off before tx. was completed.  Pt. Stated that he just couldn't do it anymore and did not want to finish.  Pts. Son in room and asked pt. If he would finish tx but patient refused  to finish and became belligerent with son and this Probation officer.  Pt. Placed  back on o2.

## 2018-02-01 NOTE — Procedures (Signed)
Intubation Procedure Note Douglas Monroe [id number] Jun 13, 1932  Procedure: Intubation Indications: Respiratory insufficiency, AECOPD, RUL PNA, AHRF   Procedure Details Consent: Risks of procedure as well as the alternatives and risks of each were explained to the (patient/caregiver).  Consent for procedure obtained. Time Out: Verified patient identification, verified procedure, site/side was marked, verified correct patient position, special equipment/implants available, medications/allergies/relevent history reviewed, required imaging and test results available.  Performed  Maximum sterile technique was used including gloves, gown and mask.  MAC, Gildescope 4, 8.0 ETT   Patient was sedated with 81m versed, 527m fentanyl, 20105mtomidate, 81m54mocuronium   Grade 1 view of VC, easy endotracheal intubation. Tube passed smoothly.   Evaluation Hemodynamic Status: Transient hypotension treated with fluid; O2 sats: stable throughout Patient's Current Condition: stable Complications: No apparent complications Patient did tolerate procedure well. Chest X-ray ordered to verify placement.  CXR: pending.   BradOctavio Gravesrd 02/01/2018

## 2018-02-01 NOTE — Procedures (Addendum)
Bronchoscopy Procedure Note Douglas Monroe [id number] 12-Sep-1932  Procedure: Bronchoscopy Indications: Diagnostic evaluation of the airways, Obtain specimens for culture and/or other diagnostic studies and Remove secretions, CXR with rapid development of mass like consolidation concern for possible collapse or endobronchial obstruction   Procedure Details Consent: Risks of procedure as well as the alternatives and risks of each were explained to the (patient/caregiver).  Consent for procedure obtained. Time Out: Verified patient identification, verified procedure, site/side was marked, verified correct patient position, special equipment/implants available, medications/allergies/relevent history reviewed, required imaging and test results available.  Performed  In preparation for procedure, patient was given 100% FiO2 and bronchoscope lubricated. Sedation: Benzodiazepines, Muscle relaxants and Etomidate, occurred immediately following intubation   Airway entered and the following bronchi were examined: RUL, RML, RLL, LUL and LLL.  The airways appeared patent. There was thick tenacious secretions within the bilateral lower lobes that was therapeutically suctioned from the airway. There was no evidence of an endobronchial lesion. The distal openings were bronchiectatic with scattered pitting and striations.    Procedures performed: BAL of the RUL was obtained with moderate purulent return Bronchoscope removed.    Evaluation Hemodynamic Status: BP stable throughout; O2 sats: stable throughout Patient's Current Condition: stable Specimens:  Sent purulent fluid, RUL BAL Complications: No apparent complications Patient did tolerate procedure well.   Douglas Monroe Douglas Monroe 02/01/2018

## 2018-02-01 NOTE — Progress Notes (Signed)
   02/01/18 0509  Vitals  Temp 98.6 F (37 C)  Temp Source Oral  BP 137/69  BP Location Right Arm  BP Method Automatic  Patient Position (if appropriate) Sitting  Pulse Rate (!) 104  Pulse Rate Source Dinamap  Oxygen Therapy  SpO2 (!) 88 %  O2 Device Nasal Cannula  O2 Flow Rate (L/min) 2 L/min  mews initiated

## 2018-02-01 DEATH — deceased

## 2018-02-02 ENCOUNTER — Inpatient Hospital Stay (HOSPITAL_COMMUNITY): Payer: Medicare Other

## 2018-02-02 DIAGNOSIS — R6521 Severe sepsis with septic shock: Secondary | ICD-10-CM

## 2018-02-02 DIAGNOSIS — J9600 Acute respiratory failure, unspecified whether with hypoxia or hypercapnia: Secondary | ICD-10-CM

## 2018-02-02 DIAGNOSIS — A419 Sepsis, unspecified organism: Secondary | ICD-10-CM

## 2018-02-02 DIAGNOSIS — J80 Acute respiratory distress syndrome: Secondary | ICD-10-CM

## 2018-02-02 LAB — CBC WITH DIFFERENTIAL/PLATELET
Abs Immature Granulocytes: 0.03 K/uL (ref 0.00–0.07)
Basophils Absolute: 0 K/uL (ref 0.0–0.1)
Basophils Relative: 0 %
Eosinophils Absolute: 0 K/uL (ref 0.0–0.5)
Eosinophils Relative: 0 %
HCT: 30.4 % — ABNORMAL LOW (ref 39.0–52.0)
Hemoglobin: 8.7 g/dL — ABNORMAL LOW (ref 13.0–17.0)
Immature Granulocytes: 1 %
Lymphocytes Relative: 3 %
Lymphs Abs: 0.1 K/uL — ABNORMAL LOW (ref 0.7–4.0)
MCH: 28.8 pg (ref 26.0–34.0)
MCHC: 28.6 g/dL — ABNORMAL LOW (ref 30.0–36.0)
MCV: 100.7 fL — ABNORMAL HIGH (ref 80.0–100.0)
Monocytes Absolute: 0.1 K/uL (ref 0.1–1.0)
Monocytes Relative: 2 %
Neutro Abs: 4.2 K/uL (ref 1.7–7.7)
Neutrophils Relative %: 94 %
Platelets: 46 K/uL — ABNORMAL LOW (ref 150–400)
RBC: 3.02 MIL/uL — ABNORMAL LOW (ref 4.22–5.81)
RDW: 16.9 % — ABNORMAL HIGH (ref 11.5–15.5)
WBC: 4.5 K/uL (ref 4.0–10.5)
nRBC: 0 % (ref 0.0–0.2)

## 2018-02-02 LAB — BLOOD GAS, ARTERIAL
Acid-base deficit: 10.3 mmol/L — ABNORMAL HIGH (ref 0.0–2.0)
Acid-base deficit: 2.9 mmol/L — ABNORMAL HIGH (ref 0.0–2.0)
Acid-base deficit: 3.2 mmol/L — ABNORMAL HIGH (ref 0.0–2.0)
Acid-base deficit: 9.3 mmol/L — ABNORMAL HIGH (ref 0.0–2.0)
Bicarbonate: 18.6 mmol/L — ABNORMAL LOW (ref 20.0–28.0)
Bicarbonate: 19.5 mmol/L — ABNORMAL LOW (ref 20.0–28.0)
Bicarbonate: 24.2 mmol/L (ref 20.0–28.0)
Bicarbonate: 25.5 mmol/L (ref 20.0–28.0)
DRAWN BY: 232811
Drawn by: 232811
Drawn by: 270211
FIO2: 1
FIO2: 100
FIO2: 100
FIO2: 100
LHR: 30 {breaths}/min
MECHVT: 370 mL
MECHVT: 370 mL
MECHVT: 370 mL
MECHVT: 370 mL
O2 Saturation: 76.1 %
O2 Saturation: 83.2 %
O2 Saturation: 84.6 %
O2 Saturation: 96.5 %
PEEP: 10 cmH2O
PEEP: 10 cmH2O
PEEP: 12 cmH2O
PEEP: 18 cmH2O
PH ART: 7.174 — AB (ref 7.350–7.450)
PO2 ART: 100 mmHg (ref 83.0–108.0)
Patient temperature: 98.6
Patient temperature: 98.6
Patient temperature: 99.2
Patient temperature: 99.6
RATE: 18 resp/min
RATE: 27 resp/min
RATE: 30 resp/min
pCO2 arterial: 59.9 mmHg — ABNORMAL HIGH (ref 32.0–48.0)
pCO2 arterial: 61.3 mmHg — ABNORMAL HIGH (ref 32.0–48.0)
pCO2 arterial: 61.8 mmHg — ABNORMAL HIGH (ref 32.0–48.0)
pCO2 arterial: 72.1 mmHg (ref 32.0–48.0)
pH, Arterial: 7.119 — CL (ref 7.350–7.450)
pH, Arterial: 7.129 — CL (ref 7.350–7.450)
pH, Arterial: 7.223 — ABNORMAL LOW (ref 7.350–7.450)
pO2, Arterial: 49.7 mmHg — ABNORMAL LOW (ref 83.0–108.0)
pO2, Arterial: 63 mmHg — ABNORMAL LOW (ref 83.0–108.0)
pO2, Arterial: 67.8 mmHg — ABNORMAL LOW (ref 83.0–108.0)

## 2018-02-02 LAB — BASIC METABOLIC PANEL
Anion gap: 9 (ref 5–15)
BUN: 69 mg/dL — ABNORMAL HIGH (ref 8–23)
CO2: 19 mmol/L — ABNORMAL LOW (ref 22–32)
Calcium: 6.8 mg/dL — ABNORMAL LOW (ref 8.9–10.3)
Chloride: 113 mmol/L — ABNORMAL HIGH (ref 98–111)
Creatinine, Ser: 2.51 mg/dL — ABNORMAL HIGH (ref 0.61–1.24)
GFR calc Af Amer: 26 mL/min — ABNORMAL LOW (ref >60)
GFR calc non Af Amer: 23 mL/min — ABNORMAL LOW (ref >60)
Glucose, Bld: 192 mg/dL — ABNORMAL HIGH (ref 70–99)
Potassium: 6.1 mmol/L — ABNORMAL HIGH (ref 3.5–5.1)
SODIUM: 141 mmol/L (ref 135–145)

## 2018-02-02 LAB — ECHOCARDIOGRAM LIMITED
Height: 65 in
Weight: 2504.43 oz

## 2018-02-02 LAB — CBC
HEMATOCRIT: 31.2 % — AB (ref 39.0–52.0)
HEMOGLOBIN: 8.9 g/dL — AB (ref 13.0–17.0)
MCH: 29.2 pg (ref 26.0–34.0)
MCHC: 28.5 g/dL — ABNORMAL LOW (ref 30.0–36.0)
MCV: 102.3 fL — ABNORMAL HIGH (ref 80.0–100.0)
Platelets: 62 10*3/uL — ABNORMAL LOW (ref 150–400)
RBC: 3.05 MIL/uL — ABNORMAL LOW (ref 4.22–5.81)
RDW: 16.7 % — ABNORMAL HIGH (ref 11.5–15.5)
WBC: 6.1 10*3/uL (ref 4.0–10.5)
nRBC: 0.3 % — ABNORMAL HIGH (ref 0.0–0.2)

## 2018-02-02 LAB — PHOSPHORUS: Phosphorus: 6.3 mg/dL — ABNORMAL HIGH (ref 2.5–4.6)

## 2018-02-02 LAB — COMPREHENSIVE METABOLIC PANEL
ALK PHOS: 74 U/L (ref 38–126)
ALT: 41 U/L (ref 0–44)
AST: 49 U/L — ABNORMAL HIGH (ref 15–41)
Albumin: 1.1 g/dL — ABNORMAL LOW (ref 3.5–5.0)
Anion gap: 10 (ref 5–15)
BILIRUBIN TOTAL: 0.8 mg/dL (ref 0.3–1.2)
BUN: 74 mg/dL — ABNORMAL HIGH (ref 8–23)
CO2: 26 mmol/L (ref 22–32)
Calcium: 6.7 mg/dL — ABNORMAL LOW (ref 8.9–10.3)
Chloride: 108 mmol/L (ref 98–111)
Creatinine, Ser: 2.83 mg/dL — ABNORMAL HIGH (ref 0.61–1.24)
GFR calc Af Amer: 23 mL/min — ABNORMAL LOW (ref >60)
GFR calc non Af Amer: 20 mL/min — ABNORMAL LOW (ref >60)
Glucose, Bld: 312 mg/dL — ABNORMAL HIGH (ref 70–99)
Potassium: 5.5 mmol/L — ABNORMAL HIGH (ref 3.5–5.1)
Sodium: 144 mmol/L (ref 135–145)
TOTAL PROTEIN: 4.2 g/dL — AB (ref 6.5–8.1)

## 2018-02-02 LAB — GLUCOSE, CAPILLARY
Glucose-Capillary: 149 mg/dL — ABNORMAL HIGH (ref 70–99)
Glucose-Capillary: 274 mg/dL — ABNORMAL HIGH (ref 70–99)

## 2018-02-02 LAB — MAGNESIUM: Magnesium: 2.1 mg/dL (ref 1.7–2.4)

## 2018-02-02 LAB — PATHOLOGIST SMEAR REVIEW

## 2018-02-02 MED ORDER — VANCOMYCIN HCL 10 G IV SOLR: 1250.0000 mg | INTRAVENOUS | Status: DC

## 2018-02-02 MED ORDER — POLYVINYL ALCOHOL 1.4 % OP SOLN
1.0000 [drp] | Freq: Four times a day (QID) | OPHTHALMIC | Status: DC | PRN
  Filled 2018-02-02: qty 15

## 2018-02-02 MED ORDER — SODIUM POLYSTYRENE SULFONATE 15 GM/60ML PO SUSP
30.0000 g | Freq: Once | ORAL | Status: AC
  Administered 2018-02-02: 30 g
  Filled 2018-02-02: qty 120

## 2018-02-02 MED ORDER — GLYCOPYRROLATE 0.2 MG/ML IJ SOLN: 0.2000 mg | INTRAMUSCULAR | Status: DC | PRN

## 2018-02-02 MED ORDER — MIDAZOLAM 50MG/50ML (1MG/ML) PREMIX INFUSION
0.0000 mg/h | INTRAVENOUS | Status: DC
  Administered 2018-02-02: 0.5 mg/h via INTRAVENOUS
  Filled 2018-02-02: qty 50

## 2018-02-02 MED ORDER — SODIUM CHLORIDE 0.9 % IV SOLN
1.0000 g | Freq: Two times a day (BID) | INTRAVENOUS | Status: DC
  Filled 2018-02-02: qty 1

## 2018-02-02 MED ORDER — VASOPRESSIN 20 UNIT/ML IV SOLN
0.0400 [IU]/min | INTRAVENOUS | Status: DC
  Administered 2018-02-02: 0.04 [IU]/min via INTRAVENOUS
  Filled 2018-02-02: qty 2

## 2018-02-02 MED ORDER — MIDAZOLAM BOLUS VIA INFUSION
2.0000 mg | INTRAVENOUS | Status: DC | PRN
  Filled 2018-02-02: qty 2

## 2018-02-02 MED ORDER — VANCOMYCIN HCL 10 G IV SOLR
1500.0000 mg | Freq: Once | INTRAVENOUS | Status: AC
  Administered 2018-02-02: 1500 mg via INTRAVENOUS
  Filled 2018-02-02: qty 1500

## 2018-02-02 MED ORDER — SODIUM CHLORIDE 0.9 % IV SOLN
2.0000 g | Freq: Once | INTRAVENOUS | Status: AC
  Administered 2018-02-02: 2 g via INTRAVENOUS
  Filled 2018-02-02: qty 2

## 2018-02-02 MED ORDER — SODIUM BICARBONATE 8.4 % IV SOLN
100.0000 meq | Freq: Once | INTRAVENOUS | Status: AC
  Administered 2018-02-02: 100 meq via INTRAVENOUS
  Filled 2018-02-02: qty 100

## 2018-02-02 MED ORDER — LACTATED RINGERS IV BOLUS
1000.0000 mL | Freq: Once | INTRAVENOUS | Status: AC
  Administered 2018-02-02: 1000 mL via INTRAVENOUS

## 2018-02-02 MED ORDER — SODIUM CHLORIDE 0.9 % IV SOLN
25.0000 ug/h | INTRAVENOUS | Status: DC
  Filled 2018-02-02: qty 50

## 2018-02-02 MED ORDER — GLYCOPYRROLATE 1 MG PO TABS: 1.0000 mg | ORAL_TABLET | ORAL | Status: DC | PRN

## 2018-02-02 MED ORDER — MIDAZOLAM HCL 2 MG/2ML IJ SOLN: 1.0000 mg | INTRAMUSCULAR | Status: DC | PRN

## 2018-02-02 MED ORDER — INSULIN ASPART 100 UNIT/ML IV SOLN
10.0000 [IU] | Freq: Once | INTRAVENOUS | Status: AC
  Administered 2018-02-02: 10 [IU] via INTRAVENOUS

## 2018-02-02 MED ORDER — STERILE WATER FOR INJECTION IV SOLN
INTRAVENOUS | Status: DC
  Administered 2018-02-02: 03:00:00 via INTRAVENOUS
  Filled 2018-02-02: qty 850

## 2018-02-02 MED ORDER — MIDAZOLAM BOLUS VIA INFUSION
1.0000 mg | INTRAVENOUS | Status: DC | PRN
  Filled 2018-02-02: qty 1

## 2018-02-02 MED ORDER — THIAMINE HCL 100 MG/ML IJ SOLN: 100.0000 mg | INTRAMUSCULAR | Status: DC

## 2018-02-02 MED ORDER — ADULT MULTIVITAMIN LIQUID CH
15.0000 mL | Freq: Every day | ORAL | Status: DC
  Filled 2018-02-02: qty 15

## 2018-02-02 MED ORDER — DEXTROSE 50 % IV SOLN
1.0000 | Freq: Once | INTRAVENOUS | Status: AC
  Administered 2018-02-02: 50 mL via INTRAVENOUS
  Filled 2018-02-02: qty 50

## 2018-02-02 MED ORDER — DEXMEDETOMIDINE HCL IN NACL 200 MCG/50ML IV SOLN
INTRAVENOUS | Status: AC
  Administered 2018-02-02: 03:00:00
  Filled 2018-02-02: qty 50

## 2018-02-02 MED ORDER — CALCIUM GLUCONATE-NACL 1-0.675 GM/50ML-% IV SOLN
1.0000 g | Freq: Once | INTRAVENOUS | Status: AC
  Administered 2018-02-02: 1000 mg via INTRAVENOUS
  Filled 2018-02-02: qty 50

## 2018-02-02 MED ORDER — FENTANYL BOLUS VIA INFUSION
50.0000 ug | INTRAVENOUS | Status: DC | PRN
  Filled 2018-02-02: qty 50

## 2018-02-02 MED ORDER — DEXMEDETOMIDINE HCL IN NACL 200 MCG/50ML IV SOLN: 0.4000 ug/kg/h | INTRAVENOUS | Status: DC

## 2018-02-02 MED ORDER — EPINEPHRINE PF 1 MG/ML IJ SOLN
0.5000 ug/min | INTRAVENOUS | Status: DC
  Filled 2018-02-02: qty 4

## 2018-02-02 MED ORDER — NOREPINEPHRINE 4 MG/250ML-% IV SOLN
0.0000 ug/min | INTRAVENOUS | Status: DC
  Administered 2018-02-02: 10 ug/min via INTRAVENOUS
  Filled 2018-02-02 (×2): qty 250

## 2018-02-02 MED ORDER — HYDROCORTISONE NA SUCCINATE PF 100 MG IJ SOLR: 50.0000 mg | Freq: Four times a day (QID) | INTRAMUSCULAR | Status: DC

## 2018-02-03 LAB — CULTURE, BAL-QUANTITATIVE W GRAM STAIN: Culture: >=100000 — AB

## 2018-02-07 ENCOUNTER — Ambulatory Visit: Payer: Self-pay | Admitting: Internal Medicine

## 2018-02-10 ENCOUNTER — Telehealth: Payer: Self-pay | Admitting: *Deleted

## 2018-02-10 NOTE — Telephone Encounter (Signed)
Received D/C form [employer] [facility] - D/C will be forwarded to Dr.ICard to be signed.

## 2018-02-14 ENCOUNTER — Telehealth: Payer: Self-pay | Admitting: Pulmonary Disease

## 2018-02-14 NOTE — Telephone Encounter (Signed)
02/14/18 I received a faxed copy of D/C today from [place] at [facility]monary I in turn faxed that copy to [facility] [phone]. Felecia will pick up the original on 02/15/18 to give to [employer]. PWR  02/15/2018 I received Original D/C from Dr.Icard I called [facility] to pick up. PWR

## 2018-03-04 NOTE — Progress Notes (Signed)
NAME:  Douglas Monroe, MRN:  [id number], DOB:  March 29, 1932, LOS: 58 ADMISSION DATE:  01/16/2018, CONSULTATION DATE:  02/28/2018  REFERRING MD:  Dr. Verlon Au, CHIEF COMPLAINT:  Acute resp failure, RUL PNA    Brief History   85 year old gentleman admitted for right upper lobe pneumonia to the ICU requiring intubation mechanical ventilation.  History of present illness   This 85 year old gentleman with a past medical history of severe COPD, FEV1 1.93 L postbronchodilator response on most recent PFTs.  Patient of Dr. Vaughan Browner.  History of peripheral vascular disease extremity colic [place] follow-up with Dr. Alvester Chou, hypertension hyperlipidemia.  Presented to his PCP initially with nonproductive cough had URI symptoms.  Was treated with amoxicillin prednisone taper and cough suppressant.  Ultimately was admitted to the emergency room and admitted to the hospital floor.  He was placed on antibiotics and breathing treatments.  On 02/01/2018 the patient was transferred to the intensive care unit requiring BiPAP with respiratory rate in the 40s on 80% FiO2.  Discussion with the family regarding intubation occurred.  Decision for mechanical ventilation was made.  Please see separate procedure note regarding intubation and mechanical ventilation.  Of note was found to have a abdominal sheath hematoma without extravasation.   Past Medical History   Past Medical History:  Diagnosis Date  . Abdominal aortic aneurysm ([place])    infrarenal -- monitored by dr berry (note states stable 4.0 to 4.1cm)  . Allergy to alpha-gal    per aptient and wife; reports he was diagnosed in spring 2019 after a bite form possibly a chigger or tick ;    . Anxiety    situational  . Arthritis    "hands" (03/15/2016)  . Cancer of skin of temple 2017   right  . COPD with emphysema ([place])   . Dyspnea    with exertion  . Exertional dyspnea   . First degree heart block   . GERD (gastroesophageal reflux disease)   . Headache(784.0)    uses  goody powder; "maybe weekly, maybe not that much" (03/15/2016)  . History of kidney stones   . HOH (hard of hearing)    left ear; no hearing aid   . HTN (hypertension) 08/20/2011  . Hyperlipidemia   . Hypertension   . Ischemic colitis ([place])   . Left carotid artery stenosis    moderate  . Migraine    "long time ago" (03/15/2016)  . 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 PVD (peripheral vascular disease) with claudication [facility]) cardiologist-  dr berry   s/p bilateral iliac stents and right sfa stenting/  doppper study 09-05-2012  revealed ABIs close to one bilaterally with patent stents/  stenting left renal artery stenosis   . Right ureteral stone   . S/P arterial stent    left renal angioplasty and stent 11-02-2012  . Sleep apnea    at risk for OSA. stop/bang score= 6; sent to PCP 10/04/13; denies use of mask on 03/15/2016  . Tubular adenoma of colon   . Urticaria      Significant Hospital Events   ICU admission 02/01/2018  Consults:  02/01/2018: Pulmonary critical care  Procedures:  02/01/2018: Endotracheal tube placement 2018-02-28: Left IJ CVC 02-28-2018: Arterial line, radial  Significant Diagnostic Tests:   Micro Data:  Respiratory cultures 02/01/2018: Pending  Antimicrobials:  Unasyn Ceftaz  Vancomycin  Interim history/subjective:  Progressive decompensation throughout yesterday evening into the night.  Febrile worsening shock worsening hypoxemia.  Now on  maximal ventilatory support as well as 2 vasopressors.  Patient is arousable from sedation.  Objective   Blood pressure (!) 79/42, pulse (!) 104, temperature (!) 101.3 F (38.5 C), resp. rate (!) 30, height 5\' 5"  (1.651 m), weight 71 kg, SpO2 (!) 80 %. CVP:  [15 mmHg-16 mmHg] 16 mmHg  Vent Mode: PRVC FiO2 (%):  [80 %-100 %] 100 % Set Rate:  [15 bmp-30 bmp] 30 bmp Vt Set:  [370 mL] 370 mL PEEP:  [10 cmH20-14 cmH20] 14 cmH20 Plateau Pressure:  [23 cmH20-31 cmH20] 31 cmH20   Intake/Output  Summary (Last 24 hours) at 02-04-18 0858 Last data filed at Feb 04, 2018 0700 Gross per 24 hour  Intake 8779.7 ml  Output 375 ml  Net 8404.7 ml   Filed Weights   01/18/18 1849  Weight: 71 kg    Examination: General: Elderly male, intubated on mechanical ventilation HENT: NCAT, sclera clear, no teeth  neck: Trachea midline, ET tube in place Lungs: Rhonchorous bilateral ventilated breath sounds Cardiovascular: Tachycardic, regular, S1-S2 Abdomen: Soft, nontender, minimally distended Extremities: No significant lower extremity edema Skin: Mottling of the surface of the skin of the anterior medial thigh Neuro: Moves all 4 extremities, will arouse to stimulation on sedation.  Resolved Hospital Problem list    Assessment & Plan:   Severe acute hypoxemic respiratory failure requiring intubation and mechanical ventilation, ARDS-like physiology from a severe gram-negative right upper lobe pneumonia, speciation pending Currently on maximal ventilatory support Septic shock secondary to above -Chest x-ray with worsened right upper lobe consolidation - The patient's images have been independently reviewed by me.   -Unable to obtain CT imaging due to progressive decompensation -Continue ceftaz, vancomycin - Changed Solu-Medrol to stress dose hydrocortisone - Continue norepinephrine, I have discontinued the phenylephrine and added vasopressin -Preferred next vasopressor would be epinephrine - As for sedation needs would prefer to be off of continuous benzodiazepines. -Will add back Precedex, more frequent as needed Versed if needed -Continue fentanyl drip, increased frequency of PRN bolus options for fentanyl - ECHO pending   Very severe COPD at baseline, FEV1 0.76 L, 0.93 L post bronchodilator response -Continue scheduled bronchodilators -IV steroids  Hypotension, secondary to septic shock -Please see above  Acute renal failure secondary to septic shock and multiorgan  failure Hyperkalemia -Persistent hyperkalemia -Status post IV treatments. -We will likely need renal replacement therapy - repeat labs from this AM pending   Positive RAST Elevated IgE, 761 back in April 2019   Best practice:  Diet: TF  Pain/Anxiety/Delirium protocol (if indicated): Yes  VAP protocol (if indicated): Yes  DVT prophylaxis: Heparin  GI prophylaxis: H2B Glucose control: SSI  Mobility: Bedrest  Code Status: FULL  Family Communication: Family, wife and son at bedside  Disposition: ICU level care   Labs   CBC: Recent Labs  Lab 01/28/18 [phone] 01/29/18 0757 01/30/18 0547 02/01/18 0952 02/04/18 0220  WBC 19.8* 21.8* 16.0* 7.3 6.1  NEUTROABS  --   --  15.1*  --   --   HGB 10.4* 11.0* 10.9* 11.8* 8.9*  HCT 33.9* 36.3* 35.2* 38.6* 31.2*  MCV 93.6 95.3 94.6 94.4 102.3*  PLT 145* 163 126* 95* 62*    Basic Metabolic Panel: Recent Labs  Lab 01/29/18 0757  01/30/18 0547 01/31/18 0726 02/01/18 0606 02/01/18 1146 02/01/18 1650 02/04/18 0235  NA 135  --  136  135 137 140  --   --  141  K 5.0  --  5.3*  5.2* 5.0 5.3*  --   --  6.1*  CL 99  --  101  100 99 102  --   --  113*  CO2 27  --  26  27 29 27   --   --  19*  GLUCOSE 158*  --  171*  171* 198* 190*  --   --  192*  BUN 30*  --  30*  30* 36* 46*  --   --  69*  CREATININE 1.09  --  0.98  1.07 1.32* 1.33* 1.56*  --  2.51*  CALCIUM 8.5*  --  8.4*  8.4* 8.7* 8.9  --   --  6.8*  MG  --   --  2.1  --   --  2.3 2.2 2.1  PHOS  --    < > 4.0 3.2 3.3 3.6 5.6* 6.3*   < > = values in this interval not displayed.   GFR: Estimated Creatinine Clearance: 19.4 mL/min (A) (by C-G formula based on SCr of 2.51 mg/dL (H)). Recent Labs  Lab 01/29/18 0757 01/30/18 0547 01/30/18 1203 02/01/18 0952 02/13/2018 0220  PROCALCITON  --   --  0.78  --   --   WBC 21.8* 16.0*  --  7.3 6.1    Liver Function Tests: Recent Labs  Lab 01/30/18 0547 01/31/18 0726 02/01/18 0606  ALBUMIN 2.0* 2.0* 1.7*   No results for  input(s): LIPASE, AMYLASE in the last 168 hours. No results for input(s): AMMONIA in the last 168 hours.  ABG    Component Value Date/Time   PHART 7.223 (L) 02-13-2018 0525   PCO2ART 61.3 (H) 02-13-18 0525   PO2ART 100 02-13-2018 0525   HCO3 24.2 Feb 13, 2018 0525   TCO2 25 08/20/2016 0351   ACIDBASEDEF 2.9 (H) 02-13-2018 0525   O2SAT 96.5 02/13/18 0525     Coagulation Profile: No results for input(s): INR, PROTIME in the last 168 hours.  Cardiac Enzymes: No results for input(s): CKTOTAL, CKMB, CKMBINDEX, TROPONINI in the last 168 hours.  HbA1C: Hgb A1c MFr Bld  Date/Time Value Ref Range Status  11/08/2017 12:04 PM 6.7 (H) 4.8 - 5.6 % Final    Comment:             Prediabetes: 5.7 - 6.4          Diabetes: >6.4          Glycemic control for adults with diabetes: <7.0   05/02/2017 6.4 (A) 4.0 - 6.0 Final    CBG: Recent Labs  Lab 02/01/18 1534 02/01/18 1941 02/01/18 2309 2018/02/13 0304 2018-02-13 0816  GLUCAP 121* 127* 132* 149* 274*    This patient is critically ill with multiple organ system failure; which, requires frequent high complexity decision making, assessment, support, evaluation, and titration of therapies. This was completed through the application of advanced monitoring technologies and extensive interpretation of multiple databases.  Large portion of today's visit was spent discussing care coordination of nursing staff as well as making changes to orders.  Also spent time with family at bedside. During this encounter critical care time was devoted to patient care services described in this note for 92 minutes.   Garner Nash, DO [facility] 02/13/2018 8:58 AM  Personal pager: [phone] If unanswered, please page CCM On-call: [phone]

## 2018-03-04 NOTE — Progress Notes (Addendum)
PCCM INTERVAL PROGRESS NOTE  Called to bedside to assess patient for CVL placement.  Douglas Monroe is 85 year old male with history of severe COPD admitted all the way back on 01/14/2018 for COPD exacerbation. Course now complicated by severe respiratory failure secondary to pneumonia. He was transferred to ICU and intubated 1/1.   Currently he is on PRVC 8cc/kg, 100%, 27, 10 PEEP and requiring 350mcg phenylephrine for progressive shock. Most recent ABG demonstrated and severe respiratory acidosis and mild metaoblic acidosis. He has known lactic acid elevation. Rate on vent was increased at that time (to 27) by EMD. He is on fentanyl and precedex for sedation.   Plan: Place CVL and arterial line Start levophed and wean off phenylephrine if able Check CVP, seemed dry on exam. Is one liter net negative for admission.  Place foley for strict I&O Change sedation from fentanyl/precedex to fentanyl/versed in the setting of shock and vent dyssynchrony  Repeat ABG Broaden ABX as his condition is worsening and he was inpatient for a couple of weeks prior to worsening.   I had a discussion with family including the patients wife, son, and grandson Douglas Monroe, who is a Museum/gallery curator here at [employer]). They understand that Douglas Bigley is very sick, and wish to continue with aggressive care so long as there is a chance at a meaningful recovery as he was fairly independent prior to admission. They are clear they would not want him to needlessly suffer.   [name], AGACNP-BC [place] Pager [phone] or [phone]  02-14-18 2:24 AM

## 2018-03-04 NOTE — Progress Notes (Signed)
[place] Progress Note Patient Name: Douglas Monroe DOB: 01-20-1933 MRN: [id number]   Date of Service  20-Feb-2018  HPI/Events of Note  K+ = 6.1 - Already given NaHCO3.   eICU Interventions  Will order: 1. Insulin/D50 IV now.  2. Calcium gluconate IV now. 3. Place OGT. 4. After OGT placement - Kayexalate 30 gm per tube X 1.         Sommer,Steven Cornelia Copa 20-Feb-2018, 4:17 AM

## 2018-03-04 NOTE — Progress Notes (Signed)
eLink Physician-Brief Progress Note Patient Name: Douglas Monroe DOB: [birth year] MRN: [id number]   Date of Service  02/16/2018  HPI/Events of Note  Multiple issues: 1. ABG on 100%/PRVC 18/TV 370/P 10 = 7.129/61.8/67.8. Patient is on 375 mcg/min of Phenylephrine IV infusion. 2. Nursing request a CVL.  eICU Interventions  Will order: 1. Increase PRVC rate to 27. 2. NaHCO3 100 meq IV now.  3. NaHCO3 IV infusion to run at 50 mL/hour.  4. Repeat ABG at 5 AM.     Intervention Category Major Interventions: Acid-Base disturbance - evaluation and management  Sommer,Steven Eugene February 16, 2018, 12:38 AM

## 2018-03-04 NOTE — Progress Notes (Signed)
Pharmacy Antibiotic Note  Douglas Monroe is a 85 y.o. male admitted on 01/23/2018 with pneumonia.  Pharmacy has been consulted for Vancomycin, ceftazidime dosing.  Plan: Vancomycin 1500mg  iv x1, then 1250mg  iv q48hr  Ceftazidime 2gm iv x1, then 1gm iv q12hr  Goal AUC = 400 - 500 for all indications, except meningitis (goal AUC > 500 and Cmin 15-20 mcg/mL)   Height: 5\' 5"  (165.1 cm) Weight: 156 lb 8.4 oz (71 kg) IBW/kg (Calculated) : 61.5  Temp (24hrs), Avg:98.7 F (37.1 C), Min:97.6 F (36.4 C), Max:99.6 F (37.6 C)  Recent Labs  Lab 01/27/18 0551 01/28/18 0644  01/29/18 0757 01/30/18 0547 01/31/18 0726 02/01/18 0606 02/01/18 0952 02/01/18 1146  WBC 16.5* 19.8*  --  21.8* 16.0*  --   --  7.3  --   CREATININE 1.08 1.22   < > 1.09 0.98  1.07 1.32* 1.33*  --  1.56*   < > = values in this interval not displayed.    Estimated Creatinine Clearance: 31.2 mL/min (A) (by C-G formula based on SCr of 1.56 mg/dL (H)).    Allergies  Allergen Reactions  . Meat Extract Other (See Comments)    Alpha gal, red meat allergy   . Ativan [Lorazepam]     Hallucination   . Pneumococcal Vaccines Swelling and Other (See Comments)    Lip swelling    Antimicrobials this admission: Vancomycin 02/27/2018 >> Ceftazidime 02-27-18 >>   Dose adjustments this admission: -  Microbiology results: -  Thank you for allowing pharmacy to be a part of this patient's care.  Douglas Monroe 02/27/2018 3:32 AM

## 2018-03-04 NOTE — Death Summary Note (Signed)
DEATH SUMMARY   Patient Details  Name: Douglas Monroe MRN: [id number] DOB: Jan 11, 1933  Admission/Discharge Information   Admit Date:  02/15/18  Date of Death: Date of Death: 03-Mar-2018  Time of Death: Time of Death: April 11, 1114  Length of Stay: 04-17-22  Referring Physician: Glendale Chard, MD   Reason(s) for Hospitalization   85 year old gentleman admitted for right upper lobe pneumonia to the ICU requiring intubation mechanical ventilation.  Diagnoses  Preliminary cause of death: Pneumonia Secondary Diagnoses (including complications and co-morbidities):  Principal Problem:   COPD with acute exacerbation ([place]) Active Problems:   PVD (peripheral vascular disease) with claudication, previous stents to bilateral iliacs. Rt. SFA also.    PVD S/P multiple PCIs   COPD (chronic obstructive pulmonary disease) (HCC)   HTN (hypertension)   Carotid artery disease (HCC)   Renal artery stenosis, progression of disease   Abdominal aortic aneurysm (HCC)   S/P arterial stent, to Lt renal art. 11/02/12   Hyperlipidemia   Rectus sheath hematoma, initial encounter   Acute respiratory failure with hypoxia (HCC)   Pneumonia of right upper lobe due to infectious organism ([place])   On mechanically assisted ventilation ([place])   Septic shock [facility])   Brief Hospital Course (including significant findings, care, treatment, and services provided and events leading to death)  Douglas Monroe is a 85 y.o. year old male who past medical history of severe COPD, FEV1 1.93 L postbronchodilator response on most recent PFTs.  Patient of Dr. Vaughan Browner.  History of peripheral vascular disease extremity colic [place] follow-up with Dr. Alvester Chou, hypertension hyperlipidemia.  Presented to his PCP initially with nonproductive cough had URI symptoms.  Was treated with amoxicillin prednisone taper and cough suppressant.  Ultimately was admitted to the emergency room and admitted to the hospital floor.  He was placed on antibiotics and  breathing treatments.  On 02/01/2018 the patient was transferred to the intensive care unit requiring BiPAP with respiratory rate in the 40s on 80% FiO2.  Discussion with the family regarding intubation occurred.  Decision for mechanical ventilation was made.  Please see separate procedure note regarding intubation and mechanical ventilation.  Of note was found to have a abdominal sheath hematoma without extravasation.   Ultimately the patient continued to decompensate and required intubation mechanical ventilation.  He had worsening renal failure as well as the inability to oxygenate.  He has a baseline severe COPD.  He was requiring significant amount of support on mechanical ventilation high PEEP and 100% FiO2 only to maintain sats in the low 80s high 70s.  There was recommendations made for paralysis to help with oxygenation.  The family was concerned about moving forward with aggressive measures and his significant other medical comorbidities.  The decision was made to withdraw care and allow for natural death.  Patient was transitioned to comfort care measures and passed away with family at bedside.  Pertinent Labs and Studies  Significant Diagnostic Studies Dg Chest 2 View  Result Date: Feb 15, 2018 CLINICAL DATA:  Left lower chest and abdominal wall discomfort with bruising following a coughing paroxysm 3 days ago. History of COPD, former smoker. EXAM: CHEST - 2 VIEW COMPARISON:  PA and lateral chest x-ray of January 11, 2018 FINDINGS: The lungs are mildly hyperinflated. There is patchy density at the left lung base which is not new. The heart and pulmonary vascularity are normal. There is calcification in the wall of the aortic arch. There is stable biapical pleural thickening. The bony thorax exhibits no acute abnormality.  IMPRESSION: Chronic bronchitic changes, stable. No alveolar pneumonia. The visualized portions of the ribs are normal. Thoracic aortic atherosclerosis. Electronically Signed    By: David  Martinique M.D.   On: 01/07/2018 13:04   Dg Chest 2 View  Result Date: 01/11/2018 CLINICAL DATA:  Cough and shortness of Breath EXAM: CHEST - 2 VIEW COMPARISON:  11/21/2016 FINDINGS: Cardiac shadows within normal limits. Mild scarring is noted in the left mid lung and left lung base. No focal infiltrate or sizable effusion is seen. No acute bony abnormality noted. IMPRESSION: Chronic scarring on the left.  No acute abnormality is noted. Electronically Signed   By: Inez Catalina M.D.   On: 01/11/2018 12:06   Dg Abd 1 View  Result Date: 02/01/2018 CLINICAL DATA:  Orogastric tube placement. EXAM: ABDOMEN - 1 VIEW COMPARISON:  Radiograph January 19, 2018. FINDINGS: Distal tip of orogastric tube is seen in proximal stomach. Contrast is seen within the colon. No abnormal bowel dilatation is noted. IMPRESSION: Distal tip of orogastric tube seen in proximal stomach. Electronically Signed   By: Marijo Conception, M.D.   On: 02/01/2018 12:00   Dg Abd 1 View  Result Date: 01/19/2018 CLINICAL DATA:  85 y/o  M; abdominal distention. EXAM: ABDOMEN - 1 VIEW COMPARISON:  None. FINDINGS: Normal bowel gas pattern. Bilateral iliac stents noted. Bones are unremarkable. No radiopaque urinary stone disease is evident. IMPRESSION: Normal bowel gas pattern. Electronically Signed   By: Kristine Garbe M.D.   On: 01/19/2018 15:22   Ct Abdomen Pelvis W Contrast  Result Date: 01/28/2018 CLINICAL DATA:  coughing fit on Saturday and had bad pains on left side. Pt been using lidocaine patches for pain relief. Wife states she looked at it today and noticed bruising. Pt takes Plavix. Pt take Tussin for cough but per wife didn't take any yesterday. Pt reports pain is worse with coughing or moving. Finished antibiotics and prednisone that was given at previous visit here. EXAM: CT ABDOMEN AND PELVIS WITH CONTRAST TECHNIQUE: Multidetector CT imaging of the abdomen and pelvis was performed using the standard protocol  following bolus administration of intravenous contrast. CONTRAST:  128m ISOVUE-300 IOPAMIDOL (ISOVUE-300) INJECTION 61% COMPARISON:  05/08/2016 FINDINGS: Lower chest: Bronchiectasis and scarring in the visualized left lower lobe as before. No pleural or pericardial effusion. Hepatobiliary: No focal liver abnormality is seen. No gallstones, gallbladder wall thickening, or biliary dilatation. Pancreas: Unremarkable. No pancreatic ductal dilatation or surrounding inflammatory changes. Spleen: Normal in size without focal abnormality. Adrenals/Urinary Tract: Normal adrenals. Benign appearing left renal cysts as before. No enhancing renal mass. No hydronephrosis. Urinary bladder physiologically distended, mildly thick-walled. Stomach/Bowel: Stomach is decompressed. Small bowel is nondilated. Normal appendix. The colon is nondilated, unremarkable. Vascular/Lymphatic: Calcified atheromatous plaque with extensive nonocclusive mural thrombus in the visualized descending thoracic, suprarenal and juxtarenal segments. Fusiform infrarenal aneurysm up to 3.5 cm transverse diameter (previously 3.4 cm 03/16/2016) with eccentric mildly occlusive mural thrombus, tapering to normal caliber above the bifurcation. Left renal artery ostial stent, patency indeterminate. Calcified ostial plaque involving the right renal artery resulting in high-grade stenosis or segmental occlusion. Heavily calcified ostial plaque and stenting through the length of the left common and external iliac arteries, patency indeterminate. Partially calcified plaque at the origin of the right common iliac artery resulting in stenosis of possible hemodynamic significance. Vascular stents through the right external iliac artery of indeterminate patency. Extensive partially calcified plaque through the common femoral arteries which are patent. No definite abdominal or pelvic adenopathy Reproductive: Moderate  prostatic enlargement with coarse calcifications. Other:  No ascites. No free air. Musculoskeletal: Moderate left rectus sheath hematoma without evidence of active arterial extravasation. Stable benign bone island in the left iliac bone near the SI joint. No fracture or worrisome bone lesion. IMPRESSION: 1. Moderate left rectus sheath hematoma without active arterial extravasation. 2. 3.5 cm infrarenal abdominal aortic aneurysm, increased from 3.4 cm 03/16/2016. 3. Prostatic enlargement with thick-walled urinary bladder suggesting a degree of bladder outlet obstruction. 4. Extensive bilateral iliac arterial stents, of indeterminate patency. Correlate with clinical symptomatology and ABIs. Electronically Signed   By: Lucrezia Europe M.D.   On: 01/24/2018 15:11   US Abdomen Limited  Result Date: 01/19/2018 CLINICAL DATA:  Abdominal distension. EXAM: LIMITED ABDOMEN ULTRASOUND FOR ASCITES TECHNIQUE: Limited ultrasound survey for ascites was performed in all four abdominal quadrants. COMPARISON:  None. FINDINGS: No ascites is noted. IMPRESSION: No ascites is noted. Electronically Signed   By: Marijo Conception, M.D.   On: 01/19/2018 11:17   Dg Chest Port 1 View  Result Date: 03-Feb-2018 CLINICAL DATA:  Mechanical ventilation. Assess support tube positioning. EXAM: PORTABLE CHEST 1 VIEW COMPARISON:  Portable chest x-ray of Feb 03, 2018 at 2:08 a.m. FINDINGS: The endotracheal tube tip lies approximately 4 cm above the carina. The esophagogastric tube tip and proximal port project below the GE junction. The left internal jugular venous catheter tip projects over the midportion of the SVC. There remains dense airspace opacification in the right upper lobe. There is confluent density in a portion of the right lower lung. The left lung is well-expanded. The interstitial markings in the upper lobe remain increased and the left retrocardiac region is dense. There is no large pleural effusion. The heart is normal in size. There is calcification in the wall of the thoracic aorta.  IMPRESSION: Stable appearance of the chest since earlier today. Dense consolidation in the mid and upper right lung and patchy areas of consolidation at the right base and in the upper and lower aspects of the left lung persist. The support tubes are in reasonable position. Electronically Signed   By: David  Martinique M.D.   On: 2018/02/03 07:25   Dg Chest Port 1 View  Result Date: 02/03/18 CLINICAL DATA:  Central line placement. EXAM: PORTABLE CHEST 1 VIEW COMPARISON:  Chest radiograph performed 02/01/2018 FINDINGS: The patient's left IJ line is noted ending about the distal SVC. The patient's endotracheal tube is seen ending 4 cm above the carina. An enteric tube is noted extending below the diaphragm. There is worsening dense right upper and mid lung zone airspace opacification, and more prominent right basilar, left apical and retrocardiac airspace opacification, concerning for significantly worsening multifocal pneumonia. A small left pleural effusion is suspected. No pneumothorax is seen. The cardiomediastinal silhouette is normal in size. No acute osseous abnormalities are identified. IMPRESSION: 1. Left IJ line noted ending about the distal SVC. No pneumothorax. 2. Worsening dense right upper and mid lung zone airspace opacification, and more prominent right basilar, left apical and retrocardiac airspace opacification, concerning for significantly worsening multifocal pneumonia. 3. Suspect small left pleural effusion. Electronically Signed   By: Garald Balding M.D.   On: 2018/02/03 02:56   Dg Chest Port 1 View  Result Date: 02/01/2018 CLINICAL DATA:  85 year old male status post endotracheal tube placement. EXAM: PORTABLE CHEST 1 VIEW COMPARISON:  Chest x-ray 02/01/2018. FINDINGS: An endotracheal tube is in place with tip 4.0 cm above the carina. A nasogastric tube is seen extending into the  stomach, however, the tip of the nasogastric tube extends below the lower margin of the image. Persistent  airspace consolidation throughout the right mid to upper lung, again somewhat mass-like in the right perihilar region. Additional areas of patchy multifocal interstitial and airspace disease are noted throughout the lungs bilaterally, increasing most notably in the right lung base. No definite pleural effusions. No evidence of pulmonary edema. Heart size is normal. Aortic atherosclerosis. IMPRESSION: 1. Support apparatus, as above. 2. Worsening airspace consolidation throughout the lungs bilaterally, most confluent throughout the right mid to upper lung. Although there is a persistent mass-like appearance in the right perihilar region, these findings are all presumably of infectious etiology given the rapid development compared to prior study from 01/26/2018. 3. Aortic atherosclerosis. Electronically Signed   By: Vinnie Langton M.D.   On: 02/01/2018 11:41   Dg Chest Port 1 View  Result Date: 02/01/2018 CLINICAL DATA:  New increased shortness of breath. EXAM: PORTABLE CHEST 1 VIEW COMPARISON:  01/26/2018 FINDINGS: There is new abnormal right hilar soft tissue enlargement, and there is a new 11 cm masslike opacity projecting over the right upper lobe with some small internal lucencies/minimally aerated lung. New patchy opacity is also present in the left upper lobe. Mild left basilar opacity is unchanged. No sizable pleural effusion or pneumothorax is identified. IMPRESSION: New 11 cm masslike opacity in the right upper lobe with abnormal right hilar density, possibly round pneumonia and lymphadenopathy given rapid development and worsening left upper lobe infiltrate. Recommend chest CT for further evaluation. Electronically Signed   By: Logan Bores M.D.   On: 02/01/2018 08:24   Dg Chest Port 1 View  Result Date: 01/26/2018 CLINICAL DATA:  Cough for 1 week EXAM: PORTABLE CHEST 1 VIEW COMPARISON:  01/22/2018 FINDINGS: Cardiac shadow is stable. Aortic calcifications are again seen. The lungs are well aerated  bilaterally with patchy bibasilar infiltrates stable from the prior exam. Some new increased density in the right upper lobe is noted consistent with some progressive infiltrate. No acute bony abnormality is noted. IMPRESSION: Bibasilar infiltrates stable from the prior exam. Increasing infiltrate in the right upper lobe particularly along the paratracheal region. Electronically Signed   By: Inez Catalina M.D.   On: 01/26/2018 19:35   Dg Chest Port 1 View  Result Date: 01/22/2018 CLINICAL DATA:  Hypoxia and shortness of breath. EXAM: PORTABLE CHEST 1 VIEW COMPARISON:  01/19/2018 FINDINGS: The heart size and mediastinal contours are stable. There is some progressive bibasilar airspace disease likely reflecting acute pneumonia. There also is some increased nodularity in the right upper lobe potentially representing pneumonia. Stable emphysematous lung disease. No pleural effusions or pneumothorax. IMPRESSION: Progressive bibasilar airspace disease likely reflecting acute pneumonia. Increased nodularity in the right upper lobe also felt to likely represent pneumonia. Electronically Signed   By: Aletta Edouard M.D.   On: 01/22/2018 09:44   Dg Chest Port 1 View  Result Date: 01/19/2018 CLINICAL DATA:  Increased short of breath EXAM: PORTABLE CHEST 1 VIEW COMPARISON:  01/16/2018 FINDINGS: The heart is upper normal in size. Normal vascularity. Aortic atherosclerotic calcification. Subsegmental atelectasis at the lung bases. Otherwise clear lungs. IMPRESSION: Bibasilar atelectasis. Electronically Signed   By: Marybelle Killings M.D.   On: 01/19/2018 10:01   Dg Swallowing Func-speech Pathology  Result Date: 01/24/2018 Objective Swallowing Evaluation: Type of Study: MBS-Modified Barium Swallow Study  Patient Details Name: TACARI REPASS MRN: [id number] Date of Birth: [birth year] Today's Date: 01/24/2018 Time: SLP Start Time (ACUTE ONLY): 1330 -SLP  Stop Time (ACUTE ONLY): 1405 SLP Time Calculation (min) (ACUTE ONLY): 35  min Past Medical History: Past Medical History: Diagnosis Date . Abdominal aortic aneurysm ([place])   infrarenal -- monitored by dr berry (note states stable 4.0 to 4.1cm) . Allergy to alpha-gal   per aptient and wife; reports he was diagnosed in spring 2019 after a bite form possibly a chigger or tick ;   . Anxiety   situational . Arthritis   "hands" (03/15/2016) . Cancer of skin of temple 2017  right . COPD with emphysema ([place])  . Dyspnea   with exertion . Exertional dyspnea  . First degree heart block  . GERD (gastroesophageal reflux disease)  . Headache(784.0)   uses goody powder; "maybe weekly, maybe not that much" (03/15/2016) . History of kidney stones  . HOH (hard of hearing)   left ear; no hearing aid  . HTN (hypertension) 08/20/2011 . Hyperlipidemia  . Hypertension  . Ischemic colitis ([place])  . Left carotid artery stenosis   moderate . Migraine   "long time ago" (03/15/2016) . Occlusion of right vertebral artery without cerebral infarction  . PVD (peripheral vascular disease) with claudication [facility]) cardiologist-  dr berry  s/p bilateral iliac stents and right sfa stenting/  doppper study 09-05-2012  revealed ABIs close to one bilaterally with patent stents/  stenting left renal artery stenosis  . Right ureteral stone  . S/P arterial stent   left renal angioplasty and stent 11-02-2012 . Sleep apnea   at risk for OSA. stop/bang score= 6; sent to PCP 10/04/13; denies use of mask on 03/15/2016 . Tubular adenoma of colon  . Urticaria  Past Surgical History: Past Surgical History: Procedure Laterality Date . ABDOMINAL ANGIOGRAM  08/19/2011  Left common iliac artery at the ostium, 6x66m ICAST covered stent, common femoral artery, 8x1057mAbbott absolute pro nitinol self-expanding stent and resulting in reduction of 80 and 90% stenosis to 0% residual . ABDOMINAL ANGIOGRAM  10/08/2010  Mid-right SFA, 6x120 EV3 Protege nitinol self-expanding stent, resulting in reduction of CTO to 0% residual . ABDOMINAL ANGIOGRAM  06/22/2010   Rt Common Femoral Artery-8x3 Absolute Pro Nitinol self-expanding stent-90% stenosis to 0% residual; Rt External Iliac Artery-9x3 Absolute Pro Nitinol self-expanding stent-70% stenosis to 0% residual; Left External Iliac Artery-8x4 Absolute Pro Nitinol self-expanding stent-90% to 0% residual . ATHERECTOMY N/A 08/19/2011  Procedure: ATHERECTOMY;  Surgeon: JoLorretta HarpMD;  [facility];  Service: Cardiovascular;  Laterality: N/A; . BACK SURGERY   . CARDIAC CATHETERIZATION  10/26/2002  dr mcTami Ribasnormal coronary arteries/ normal  LVF . CARDIOVASCULAR STRESS TEST  06-12-2010   dr berry  normal perfusion study/ no ischemia/  ef 69% . COLONOSCOPY WITH PROPOFOL N/A 03/19/2016  Procedure: COLONOSCOPY WITH PROPOFOL;  Surgeon: KaMauri PoleMD;  [facility];  Service: Endoscopy;  Laterality: N/A;  was inpatient, but got d/c'd home and is coming back as an OP . CYSTOSCOPY W/ URETERAL STENT PLACEMENT Left 06/07/2016  Procedure: CYSTOSCOPY WITH RETROGRADE PYELOGRAM/URETERAL STENT REPLACEMENT;  Surgeon: McCleon GustinMD;  [facility]: [facility];  Service: Urology;  Laterality: Left; . CYSTOSCOPY W/ URETERAL STENT PLACEMENT Left 09/20/2016  Procedure: CYSTOSCOPY WITH RETROGRADE PYELOGRAM/URETERAL STENT PLACEMENT;  Surgeon: McCleon GustinMD;  Location: [facility];  Service: Urology;  Laterality: Left; . CYSTOSCOPY WITH RETROGRADE PYELOGRAM, URETEROSCOPY AND STENT PLACEMENT Right 10/10/2013  Procedure: uretheral dilation, CYSTOSCOPY, URETEROSCOPY, stone extraction, STENT PLACEMENT;  Surgeon: DaBernestine AmassMD;  [facility]  Urology;  Laterality: Right; . CYSTOSCOPY WITH RETROGRADE PYELOGRAM, URETEROSCOPY AND STENT PLACEMENT Left 02/08/2017  Procedure: CYSTOSCOPY WITH RETROGRADE PYELOGRAM, URETEROSCOPY;  Surgeon: Cleon Gustin, MD;  Location: [facility];  Service: Urology;  Laterality: Left; . CYSTOSCOPY WITH RETROGRADE PYELOGRAM, URETEROSCOPY AND STENT PLACEMENT Left  05/16/2017  Procedure: CYSTOSCOPY WITH RETROGRADE PYELOGRAM, URETEROSCOPY WITH ABLATION OF URETERAL TUMOR, BLADDER BIOPSY AND FULGURATION, AND STENT PLACEMENT;  Surgeon: Cleon Gustin, MD;  Location: [facility];  Service: Urology;  Laterality: Left; . CYSTOSCOPY WITH RETROGRADE PYELOGRAM, URETEROSCOPY AND STENT PLACEMENT Left 06/13/2017  Procedure: CYSTOSCOPY WITH RETROGRADE PYELOGRAM, URETEROSCOPY STENT EXCHANGE;  Surgeon: Cleon Gustin, MD;  [facility]: [facility];  Service: Urology;  Laterality: Left; . CYSTOSCOPY WITH RETROGRADE PYELOGRAM, URETEROSCOPY AND STENT PLACEMENT Left 10/31/2017  Procedure: CYSTOSCOPY WITH RETROGRADE PYELOGRAM, URETEROSCOPY ,FULGURATION OF URETERAL TUMOR AND STENT PLACEMENT;  Surgeon: Cleon Gustin, MD;  [facility];  Service: Urology;  Laterality: Left;  1 HR POSSIBLE STENT PLACEMENT, ALSO POSSIBLE TUMOR ABLATION . CYSTOSCOPY WITH URETEROSCOPY Left 05/09/2016  Procedure: CYSTOSCOPY, RETROGRADE, LEFT URETEROSCOPY, LEFT URETERAL BIOPSIES, HOLMIUM LASER FULGERATION, LEFT URETERAL STENT PLACEMENT;  Surgeon: Irine Seal, MD;  [facility];  Service: Urology;  Laterality: Left; . CYSTOSCOPY/URETEROSCOPY/HOLMIUM LASER Left 09/20/2016  Procedure: CYSTOSCOPY/URETEROSCOPY/HOLMIUM LASER;  Surgeon: Cleon Gustin, MD;  [facility];  Service: Urology;  Laterality: Left; . HOLMIUM LASER APPLICATION Right 08/02/5364  Procedure: HOLMIUM LASER APPLICATION;  Surgeon: [name], MD;  Location: [facility];  Service: Urology;  Laterality: Right; . HOLMIUM LASER APPLICATION Left 05/05/345  Procedure: TUMOR ABLATION WITH HOLMIUM LASER;  Surgeon: Cleon Gustin, MD;  [facility];  Service: Urology;  Laterality: Left; . LUMBAR DISC SURGERY  1970's . RENAL ANGIOGRAM N/A 11/02/2012  Procedure: RENAL ANGIOGRAM;  Surgeon: Lorretta Harp, MD;  [facility];  Service: Cardiovascular;  Laterality: N/A; . RENAL ARTERY STENT Left 11/02/2012 . SKIN CANCER EXCISION  Right 2017  temple . SOFT TISSUE CYST EXCISION  1960's  "outside part of lung LLL" . TRANSURETHRAL RESECTION OF BLADDER TUMOR N/A 06/13/2017  Procedure: TRANSURETHRAL RESECTION OF BLADDER TUMOR (TURBT);  Surgeon: Cleon Gustin, MD;  [facility];  Service: Urology;  Laterality: N/A; HPI: 85 yo male adm to [facility] 01/06/2018 with breathing difficulties.  Pt PMH + for COPD, PVD, AAA, GERD for which he takes a PPI and ranitidine.  Pt noted to become "choked" with liquids during meal this week and swallow eval ordered.  CXR initially negative for acute findings but recent image concerning for right upper lobe pna- 01/22/18.  MBS indicated due to concerns for dysphagia/aspiration.   Subjective: pt awake in chair Assessment / Plan / Recommendation CHL IP CLINICAL IMPRESSIONS 01/24/2018 Clinical Impression Pt presents with mild oropharyngeal dysphagia without aspiration of any consistency tested.  He did mildly penetrate with thin liquids above vocal cords before the swallow with tsp and during with cup/straw.  Chin tuck posture prevented penetration - but uncertain if pt can conduct consistently.  Mild pharyngeal residuals (with liquids more than solids) present without pt consistent sensation.  Cued dry and reflexive swallows decrease residuals.  Barium tablet taken with thin appeared to lodge at distal esophagus without pt awareness.  Appearance of backflow of liquids - suspect pt's "choking" may be due to his esophageal issues.  Pt was noted to become dyspneic and cough during the MBS but no aspiration observed.  Using live video monitoring, pt educated to findings/recommendations. SLP Visit Diagnosis  Dysphagia, oropharyngeal phase (R13.12) Attention and concentration deficit following -- Frontal lobe and executive function deficit following -- Impact on safety and function Moderate aspiration risk   CHL IP TREATMENT RECOMMENDATION 01/24/2018 Treatment Recommendations Therapy as outlined in treatment plan below    Prognosis 01/24/2018 Prognosis for Safe Diet Advancement Fair Barriers to Reach Goals Time post onset Barriers/Prognosis Comment -- CHL IP DIET RECOMMENDATION 01/24/2018 SLP Diet Recommendations Regular solids;Thin liquid Liquid Administration via Cup;Straw Medication Administration Other (Comment) Compensations Slow rate;Small sips/bites Postural Changes Remain semi-upright after after feeds/meals (Comment);Seated upright at 90 degrees   CHL IP OTHER RECOMMENDATIONS 01/24/2018 Recommended Consults -- Oral Care Recommendations Oral care BID Other Recommendations --   CHL IP FOLLOW UP RECOMMENDATIONS 01/24/2018 Follow up Recommendations None   CHL IP FREQUENCY AND DURATION 01/24/2018 Speech Therapy Frequency (ACUTE ONLY) min 1 x/week Treatment Duration 1 week      CHL IP ORAL PHASE 01/24/2018 Oral Phase Impaired Oral - Pudding Teaspoon -- Oral - Pudding Cup -- Oral - Honey Teaspoon -- Oral - Honey Cup -- Oral - Nectar Teaspoon -- Oral - Nectar Cup WFL;Lingual pumping;Holding of bolus Oral - Nectar Straw -- Oral - Thin Teaspoon WFL Oral - Thin Cup WFL Oral - Thin Straw WFL Oral - Puree WFL;Lingual pumping;Weak lingual manipulation Oral - Mech Soft NT Oral - Regular WFL Oral - Multi-Consistency -- Oral - Pill WFL Oral Phase - Comment --  CHL IP PHARYNGEAL PHASE 01/24/2018 Pharyngeal Phase Impaired Pharyngeal- Pudding Teaspoon -- Pharyngeal -- Pharyngeal- Pudding Cup -- Pharyngeal -- Pharyngeal- Honey Teaspoon -- Pharyngeal -- Pharyngeal- Honey Cup -- Pharyngeal -- Pharyngeal- Nectar Teaspoon -- Pharyngeal -- Pharyngeal- Nectar Cup WFL Pharyngeal -- Pharyngeal- Nectar Straw -- Pharyngeal -- Pharyngeal- Thin Teaspoon Penetration/Aspiration during swallow;Reduced laryngeal elevation;Reduced airway/laryngeal closure;Reduced epiglottic inversion Pharyngeal Material enters airway, remains ABOVE vocal cords and not ejected out Pharyngeal- Thin Cup Reduced laryngeal elevation;Reduced airway/laryngeal  closure;Penetration/Aspiration during swallow;Reduced tongue base retraction;Reduced epiglottic inversion Pharyngeal Material enters airway, remains ABOVE vocal cords and not ejected out Pharyngeal- Thin Straw Reduced laryngeal elevation;Reduced airway/laryngeal closure;Penetration/Aspiration during swallow;Reduced tongue base retraction;Reduced epiglottic inversion;Pharyngeal residue - pyriform;Pharyngeal residue - valleculae Pharyngeal Material enters airway, remains ABOVE vocal cords and not ejected out Pharyngeal- Puree WFL Pharyngeal -- Pharyngeal- Mechanical Soft -- Pharyngeal -- Pharyngeal- Regular WFL Pharyngeal -- Pharyngeal- Multi-consistency -- Pharyngeal -- Pharyngeal- Pill WFL Pharyngeal -- Pharyngeal Comment mild residuals in pharynx with liquids more than solids at vallecular and pyriform sinus region, pt did not sense residuals consistently but dry swallows help to decrease residuals, chin tuck posture did not result in laryngeal penetration but uncertain if pt able to consistently perform, trace laryngeal penetration only, pt did not aspirate despite coughing during mbs   CHL IP CERVICAL ESOPHAGEAL PHASE 01/24/2018 Cervical Esophageal Phase Impaired Pudding Teaspoon -- Pudding Cup -- Honey Teaspoon -- Honey Cup -- Nectar Teaspoon -- Nectar Cup -- Nectar Straw -- Thin Teaspoon -- Thin Cup -- Thin Straw -- Puree -- Mechanical Soft -- Regular -- Multi-consistency -- Pill -- Cervical Esophageal Comment upon esophageal sweep, barium tablet lodged at distal esophagus WITHOUT pt sensation, pt also appeared with backflow of barium without sensation, barium tablet did not clear despite further intake of puree and water, pt reports he is on a PPI but admits to having some issues with refluxing in the hospital and states this is worse than his infrequent choking on liquids Luanna Salk, MS [facility] SLP Acute Rehab Services Pager [phone] Office [phone] Macario Golds 01/24/2018, 3:21  PM               Vas [place] Lower Extremity Venous (dvt)  Result Date: 01/26/2018  Lower Venous Study Indications: Pain.  Comparison Study: 01/22/18 - Negative for bilateral DVT Performing Technologist: Oliver Hum RVT  Examination Guidelines: A complete evaluation includes B-mode imaging, spectral Doppler, color Doppler, and power Doppler as needed of all accessible portions of each vessel. Bilateral testing is considered an integral part of a complete examination. Limited examinations for reoccurring indications may be performed as noted.  Right Venous Findings: +---------+---------------+---------+-----------+----------+-------+          CompressibilityPhasicitySpontaneityPropertiesSummary +---------+---------------+---------+-----------+----------+-------+ CFV      Full           Yes      Yes                          +---------+---------------+---------+-----------+----------+-------+ SFJ      Full                                                 +---------+---------------+---------+-----------+----------+-------+ FV Prox  Full                                                 +---------+---------------+---------+-----------+----------+-------+ FV Mid   Full                                                 +---------+---------------+---------+-----------+----------+-------+ FV DistalFull                                                 +---------+---------------+---------+-----------+----------+-------+ PFV      Full                                                 +---------+---------------+---------+-----------+----------+-------+ POP      Full           Yes      Yes                          +---------+---------------+---------+-----------+----------+-------+ PTV      Full                                                 +---------+---------------+---------+-----------+----------+-------+ PERO     Full                                                  +---------+---------------+---------+-----------+----------+-------+  Left Venous Findings: +---+---------------+---------+-----------+----------+-------+    CompressibilityPhasicitySpontaneityPropertiesSummary +---+---------------+---------+-----------+----------+-------+ CFVFull  Yes      Yes                          +---+---------------+---------+-----------+----------+-------+    Summary: Right: There is no evidence of deep vein thrombosis in the lower extremity. No cystic structure found in the popliteal fossa. Left: No evidence of common femoral vein obstruction.  *See table(s) above for measurements and observations. Electronically signed by Monica Martinez MD on 01/26/2018 at 2:07:07 PM.    Final    Vas [place] Lower Extremity Venous (dvt)  Result Date: 01/23/2018  Lower Venous Study Indications: Edema, and Tachycardia.  Limitations: Poor ultrasound/tissue interface and patient positioning. Performing Technologist: Oliver Hum RVT  Examination Guidelines: A complete evaluation includes B-mode imaging, spectral Doppler, color Doppler, and power Doppler as needed of all accessible portions of each vessel. Bilateral testing is considered an integral part of a complete examination. Limited examinations for reoccurring indications may be performed as noted.  Right Venous Findings: +---------+---------------+---------+-----------+----------+-------+          CompressibilityPhasicitySpontaneityPropertiesSummary +---------+---------------+---------+-----------+----------+-------+ CFV      Full           Yes      Yes                          +---------+---------------+---------+-----------+----------+-------+ SFJ      Full                                                 +---------+---------------+---------+-----------+----------+-------+ FV Prox  Full                                                  +---------+---------------+---------+-----------+----------+-------+ FV Mid   Full                                                 +---------+---------------+---------+-----------+----------+-------+ FV DistalFull                                                 +---------+---------------+---------+-----------+----------+-------+ PFV      Full                                                 +---------+---------------+---------+-----------+----------+-------+ POP      Full           Yes      Yes                          +---------+---------------+---------+-----------+----------+-------+ PTV      Full                                                 +---------+---------------+---------+-----------+----------+-------+  PERO     Full                                                 +---------+---------------+---------+-----------+----------+-------+  Left Venous Findings: +---------+---------------+---------+-----------+----------+-------+          CompressibilityPhasicitySpontaneityPropertiesSummary +---------+---------------+---------+-----------+----------+-------+ CFV      Full           Yes      Yes                          +---------+---------------+---------+-----------+----------+-------+ SFJ      Full                                                 +---------+---------------+---------+-----------+----------+-------+ FV Prox  Full                                                 +---------+---------------+---------+-----------+----------+-------+ FV Mid   Full                                                 +---------+---------------+---------+-----------+----------+-------+ FV DistalFull                                                 +---------+---------------+---------+-----------+----------+-------+ PFV      Full                                                  +---------+---------------+---------+-----------+----------+-------+ POP      Full           Yes      Yes                          +---------+---------------+---------+-----------+----------+-------+ PTV      Full                                                 +---------+---------------+---------+-----------+----------+-------+ PERO     Full                                                 +---------+---------------+---------+-----------+----------+-------+    Summary: Right: There is no evidence of deep vein thrombosis in the lower extremity. No cystic structure found in the popliteal fossa. Left: There is no evidence of deep vein thrombosis in the lower extremity. No cystic structure found in the popliteal fossa.  *  See table(s) above for measurements and observations. Electronically signed by Curt Jews MD on 01/23/2018 at 1:47:11 PM.    Final     Microbiology Recent Results (from the past 240 hour(s))  Culture, bal-quantitative     Status: Abnormal   Collection Time: 02/01/18 11:05 AM  Result Value Ref Range Status   Specimen Description   Final    BRONCHIAL ALVEOLAR LAVAGE Performed at [facility], [place] [address], [place], [place] [address]    Special Requests   Final    NONE Performed at [facility], [place] [address], [place], [place] [address]    Gram Stain   Final    MODERATE WBC PRESENT, PREDOMINANTLY PMN ABUNDANT GRAM NEGATIVE RODS RARE YEAST Performed at [facility], [place] [address] [place], [place] [address]    Culture >=100,000 COLONIES/mL PSEUDOMONAS AERUGINOSA (A)  Final   Report Status 02/03/2018 FINAL  Final   Organism ID, Bacteria PSEUDOMONAS AERUGINOSA (A)  Final      Susceptibility   Pseudomonas aeruginosa - MIC*    CEFTAZIDIME 2 SENSITIVE Sensitive     CIPROFLOXACIN 2 INTERMEDIATE Intermediate     GENTAMICIN <=1 SENSITIVE Sensitive     IMIPENEM <=0.25 SENSITIVE Sensitive     CEFEPIME 2 SENSITIVE  Sensitive     * >=100,000 COLONIES/mL PSEUDOMONAS AERUGINOSA    Lab Basic Metabolic Panel: No results for input(s): NA, K, CL, CO2, GLUCOSE, BUN, CREATININE, CALCIUM, MG, PHOS in the last 168 hours. Liver Function Tests: No results for input(s): AST, ALT, ALKPHOS, BILITOT, PROT, ALBUMIN in the last 168 hours. No results for input(s): LIPASE, AMYLASE in the last 168 hours. No results for input(s): AMMONIA in the last 168 hours. CBC: No results for input(s): WBC, NEUTROABS, HGB, HCT, MCV, PLT in the last 168 hours. Cardiac Enzymes: No results for input(s): CKTOTAL, CKMB, CKMBINDEX, TROPONINI in the last 168 hours. Sepsis Labs: No results for input(s): PROCALCITON, WBC, LATICACIDVEN in the last 168 hours.  Procedures/Operations   Endotracheal intubation Central venous access   Darling Cieslewicz L Almee Pelphrey 02/09/2018, 7:36 PM

## 2018-03-04 NOTE — Progress Notes (Signed)
At 1115 patients HR was asystole with no respirations noted on the monitor.  Alroy Dust RN and Benjamine Mola RN auscultated and felt for signs of life.  After one minute no  Heart or lung sounds were heard and no pulse was felt.  Patient pronounced dead at 1116.  Family was at bedside.  Physician, Dr. Elpidio Anis notified, [place] [facility] contacted.  50 ml of Fentanyl and 10 ml versed was wasted in unit stericylce witnessed by Benjamine Mola.

## 2018-03-04 NOTE — Progress Notes (Signed)
OT Cancellation Note  Patient Details Name: Douglas Monroe MRN: [id number] DOB: 1932-05-05   Cancelled Treatment:    Reason Eval/Treat Not Completed: Other (comment). Pt on vent  [name] 02/19/18, 7:28 AM  Lesle Chris, OTR/L Acute Rehabilitation Services [phone] [facility] pager [phone] office 02-19-18

## 2018-03-04 NOTE — Procedures (Signed)
Arterial Catheter Insertion Procedure Note Douglas Monroe [id number] 27-Nov-1932  Procedure: Insertion of Arterial Catheter  Indications: Blood pressure monitoring and Frequent blood sampling  Procedure Details Consent: Risks of procedure as well as the alternatives and risks of each were explained to the (patient/caregiver).  Consent for procedure obtained. Time Out: Verified patient identification, verified procedure, site/side was marked, verified correct patient position, special equipment/implants available, medications/allergies/relevent history reviewed, required imaging and test results available.  Performed  Maximum sterile technique was used including antiseptics, cap, gloves, gown, hand hygiene, mask and sheet. Skin prep: Chlorhexidine; local anesthetic administered 20 gauge catheter was inserted into right femoral artery using the Seldinger technique. ULTRASOUND GUIDANCE USED: YES Evaluation Blood flow good; BP tracing good. Complications: No apparent complications.    [name], AGACNP-BC [place] Pager [phone] or [phone]  02/07/18 2:23 AM

## 2018-03-04 NOTE — Progress Notes (Signed)
  Echocardiogram 2D Echocardiogram limited has been performed.  Jennette Dubin Feb 22, 2018, 9:53 AM

## 2018-03-04 NOTE — Progress Notes (Signed)
PT Cancellation Note  Patient Details Name: Douglas Monroe MRN: [id number] DOB: August 05, 1932   Cancelled Treatment:     PT deferred, pt has been placed on ventilatory 2* increased WOB.  Will follow.   Tonda Wiederhold 2018-02-28, 8:40 AM

## 2018-03-04 NOTE — Progress Notes (Signed)
Nutrition Brief Note  Patient was admitted on 01/18/18 and was intubated 1/1 d/t increased WOB. Consult received for TF initiation and management. Chart reviewed. Per Dr. Juline Patch note today at 10:10 AM, the patient's wife has elected to pursue comfort care. Order for comfort care order set has been placed.  Pt now transitioning to comfort care.  No further nutrition interventions warranted at this time.  Please re-consult as needed.     Jarome Matin, MS, RD, LDN, [facility] Inpatient Clinical Dietitian Pager # [phone] After hours/weekend pager # [phone]

## 2018-03-04 NOTE — Progress Notes (Signed)
[place] Progress Note Patient Name: ROBB SIBAL DOB: 1932/10/23 MRN: [id number]   Date of Service  February 26, 2018  HPI/Events of Note  ABG on 100%/PRVC 28/TV 370/P12 = 7.119/59.9/63.0.   eICU Interventions  Will order: 1. Increase PRVC rate to 30.  2. NaHCO3 100 meq IV now.  3. Increase NaHCO3 IV infusion rate to 100 mL/hour.  4. Repeat ABG at 5 AM (already ordered)/      Intervention Category Major Interventions: Acid-Base disturbance - evaluation and management;Respiratory failure - evaluation and management  Zawadi Aplin Cornelia Copa 02/26/2018, 3:59 AM

## 2018-03-04 NOTE — Progress Notes (Signed)
PCCM:  At this time the patient has had progressive desaturations. I offered paralysis and full control of ventilatory mechanics.   The family would like to pursue comfort care.   The wife has stated she would like to give the final decision to her son.   The patients son and 8 other family members were present for the [place] discussion. The family has elected for comfort care measures.   The comfort care order set will be placed.   Garner Nash, DO [facility] 02/25/2018 10:12 AM  Personal pager: [phone] If unanswered, please page CCM On-call: [phone]   1

## 2018-03-04 NOTE — Care Management Note (Signed)
Case Management Note  Patient Details  Name: Douglas Monroe MRN: [id number] Date of Birth: [birth year]  Subjective/Objective:     PNA              Discharge readiness is indicated by patient meeting Recovery Milestones, including ALL of the following: ? Hemodynamic stability no ? Tachypnea absent heart rate =132 ? Hypoxemia absent on full ventilator support ? Afebrile, or temperature acceptable for next level of care ? No temp=101.3 ? Oxygen absent or at baseline need on full vent support ? Mental status at baseline sedated ? Antibiotic regimen acceptable for next level of care  ? Iv fortaz ? Ambulatory sedated ? Oral hydration, medications, and diet ? Npo, iv flds, iv pressors-Epinephrine, levophed, iv vancomycin, fentaNYL and Iv precedex ? icu level of care in guarded condition   Action/Plan: Following for progression of care. Following for cm needs none present at this time.  Expected Discharge Date:  (unknown)               Expected Discharge Plan:  Home/Self Care  In-House Referral:     Discharge planning Services  CM Consult  Post Acute Care Choice:    Choice offered to:     DME Arranged:    DME Agency:     HH Arranged:    HH Agency:     Status of Service:  In process, will continue to follow  If discussed at Long Length of Stay Meetings, dates discussed:    Additional Comments:  Leeroy Cha, RN 2018-02-12, 10:03 AM

## 2018-03-04 NOTE — Procedures (Signed)
Central Venous Catheter Insertion Procedure Note Douglas Monroe [id number] 05/14/1932  Procedure: Insertion of Central Venous Catheter Indications: Assessment of intravascular volume, Drug and/or fluid administration and Frequent blood sampling  Procedure Details Consent: Risks of procedure as well as the alternatives and risks of each were explained to the (patient/caregiver).  Consent for procedure obtained. Time Out: Verified patient identification, verified procedure, site/side was marked, verified correct patient position, special equipment/implants available, medications/allergies/relevent history reviewed, required imaging and test results available.  Performed  Maximum sterile technique was used including antiseptics, cap, gloves, gown, hand hygiene, mask and sheet. Skin prep: Chlorhexidine; local anesthetic administered A antimicrobial bonded/coated triple lumen catheter was placed in the left internal jugular vein using the Seldinger technique. Catheter placed to 20 cm. Blood aspirated via all 3 ports and then flushed x 3. Line sutured x 2 and dressing applied.  Ultrasound guidance used.Yes.    Evaluation Blood flow good Complications: No apparent complications Patient did tolerate procedure well. Chest X-ray ordered to verify placement.  CXR: pending.  Douglas Monroe, AGACNP-BC [place] Pager [phone] or [phone]  2018/02/07 2:23 AM

## 2018-03-04 NOTE — Progress Notes (Signed)
I offered support to family when patient was critical.  I offered prayer at family's request along with Darrick Meigs, pt's grandson who works on the unit.  Patient was surrounded by his wife, [name], his son, Alvester Chou and Barry's wife, Lattie Haw, his daughter, Butch Penny, and three grandchildren.   [name] was very concerned about what they had "put him through."  I affirmed the love present in the decision making and that they made the best decisions they could with the information that they had.  Spiritual Care team remains available if changes occur, please page as needed.  [place], Bcc Pager, [phone] 6:30 AM    02/10/18 0600  Clinical Encounter Type  Visited With Patient and family together  Visit Type Spiritual support  Referral From Nurse  Spiritual Encounters  Spiritual Needs Prayer;Emotional

## 2018-03-04 DEATH — deceased

## 2018-03-13 ENCOUNTER — Ambulatory Visit (HOSPITAL_COMMUNITY): Admit: 2018-03-13 | Payer: Medicare Other | Admitting: Urology

## 2018-03-13 SURGERY — CYSTOURETEROSCOPY, WITH RETROGRADE PYELOGRAM AND STENT INSERTION
Anesthesia: General | Laterality: Left

## 2018-05-17 ENCOUNTER — Ambulatory Visit: Payer: Medicare Other | Admitting: Internal Medicine

## 2018-05-17 ENCOUNTER — Ambulatory Visit: Payer: Medicare Other

## 2018-06-07 ENCOUNTER — Ambulatory Visit: Payer: Medicare Other

## 2018-06-07 ENCOUNTER — Ambulatory Visit: Payer: Medicare Other | Admitting: Internal Medicine

## 2018-12-27 IMAGING — CR DG CHEST 1V PORT
1 series · 1 of 1 positions shown · non-contrast
Comparison: 01/31/2014 chest radiograph

CLINICAL DATA: 83 y/o  M; shortness of breath.

EXAM:
PORTABLE CHEST 1 VIEW

[AP]
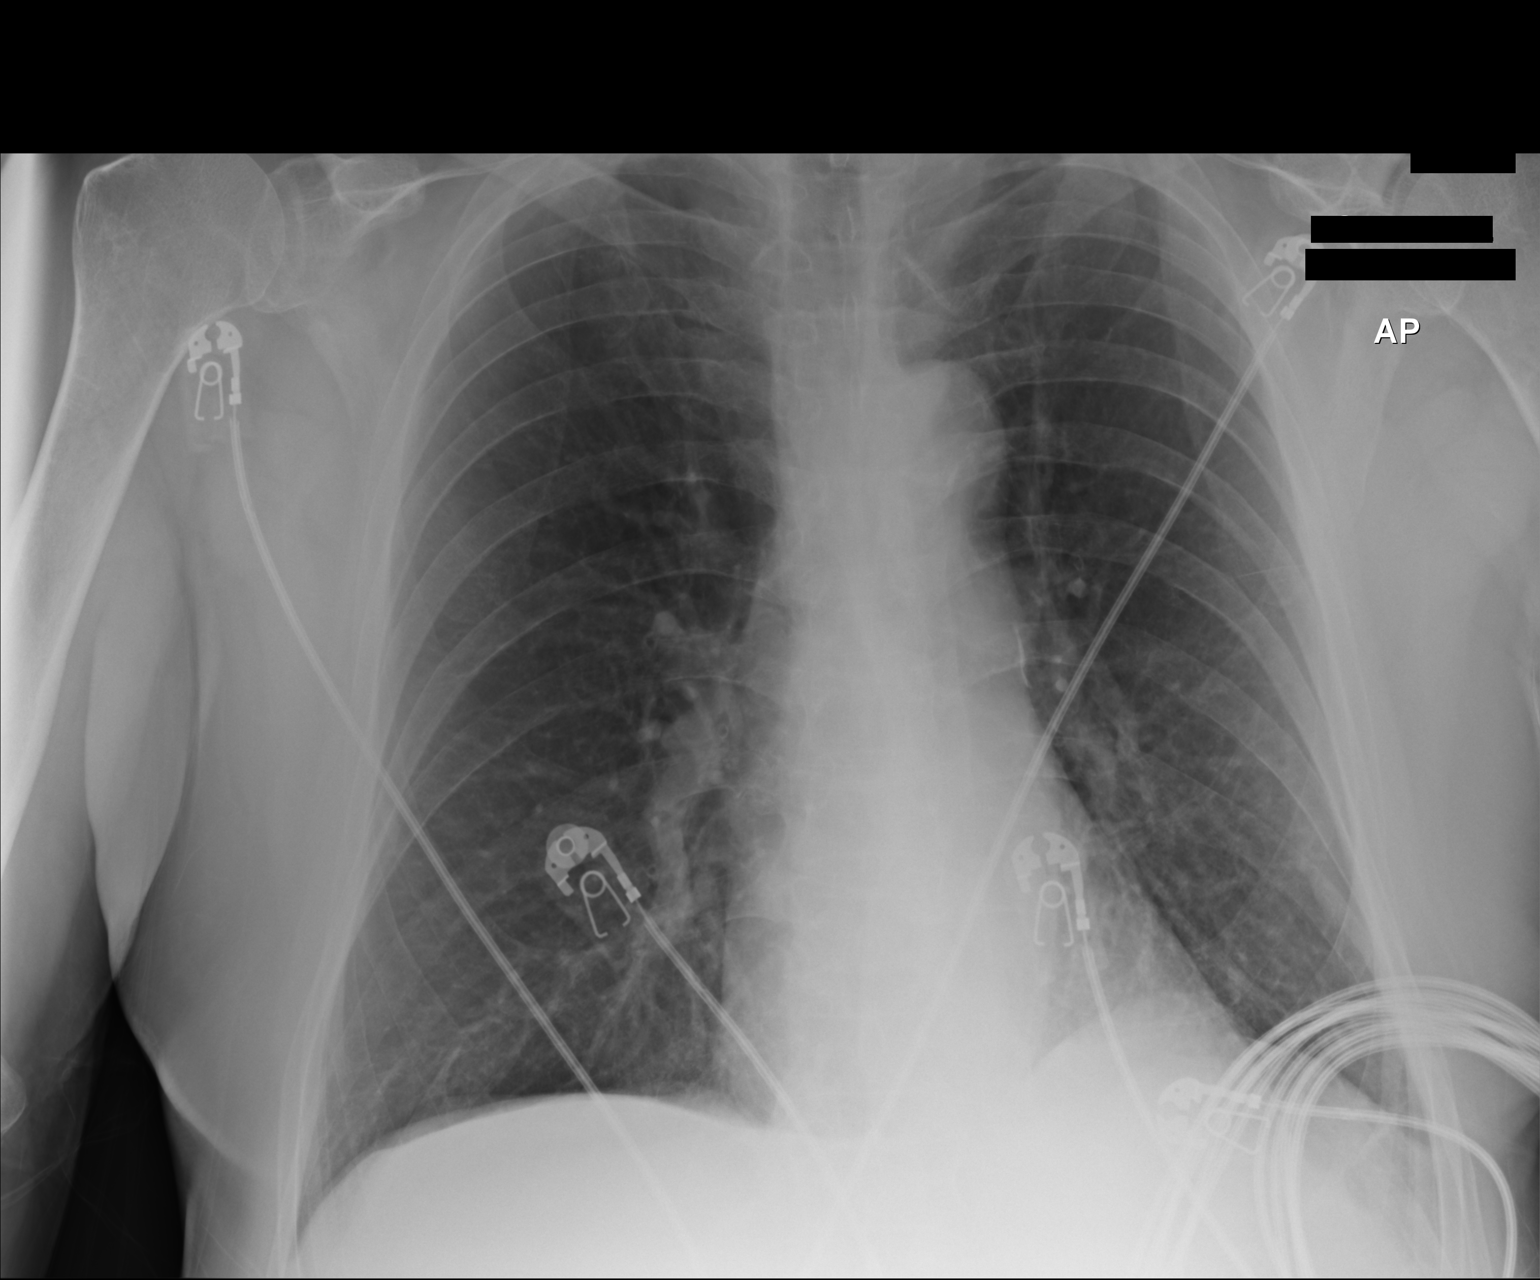

[1 of 1 positions shown; findings below may reference images not displayed]

FINDINGS: Stable normal cardiac silhouette. Aortic atherosclerosis with
calcification. Biapical pleuroparenchymal scarring. Bronchitic
changes in the left lung base. No focal consolidation. No pleural
effusion. Bones are unremarkable.
IMPRESSION: Bronchitic changes in the left lung base. No focal consolidation or
pleural effusion.

By: Puuha Nord M.D.

## 2019-09-26 IMAGING — CT CT CHEST W/O CM
2 of 3 series · 15 of 36 positions shown, 18 images · non-contrast
Comparison: Chest x-ray 11/21/2016

CLINICAL DATA: Followup lung nodules identified on recent chest
radiograph

EXAM:
CT CHEST WITHOUT CONTRAST
TECHNIQUE: Multidetector CT imaging of the chest was performed following the
standard protocol without IV contrast.

[Series 2: thorax · axial · 0.74mm/px · z∈[-293,-29]mm · 12 of 156 slices shown, 15 images]
[im 12/156  mediastinal]
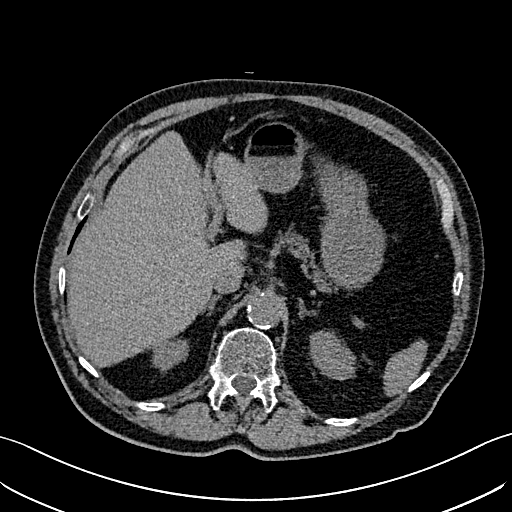
[im 12/156  lung]
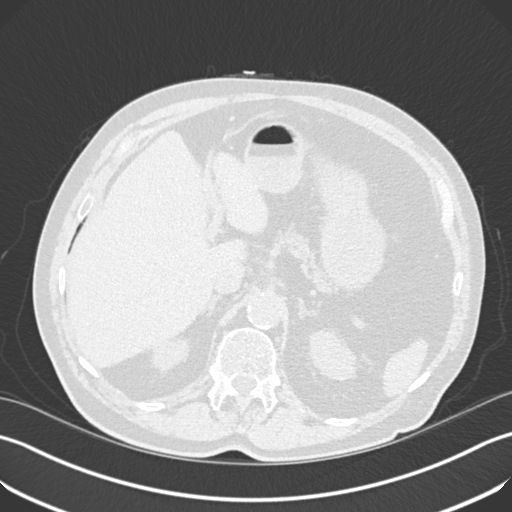
[im 23/156  lung]
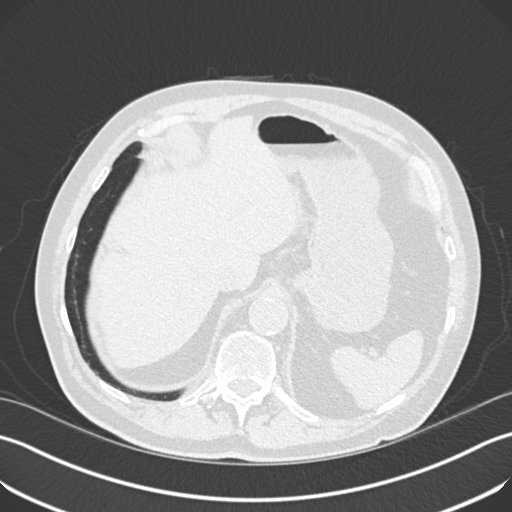
[im 35/156  lung]
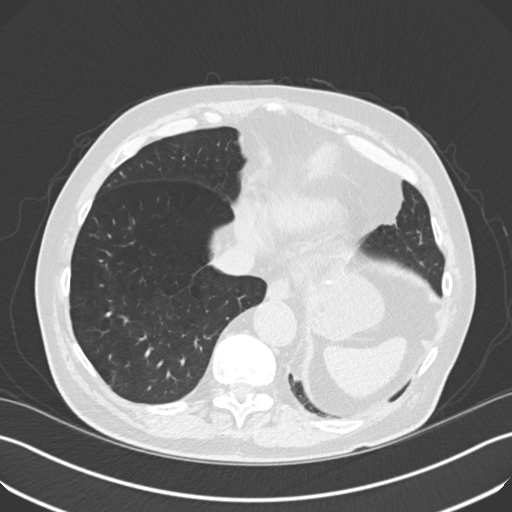
[im 46/156  lung]
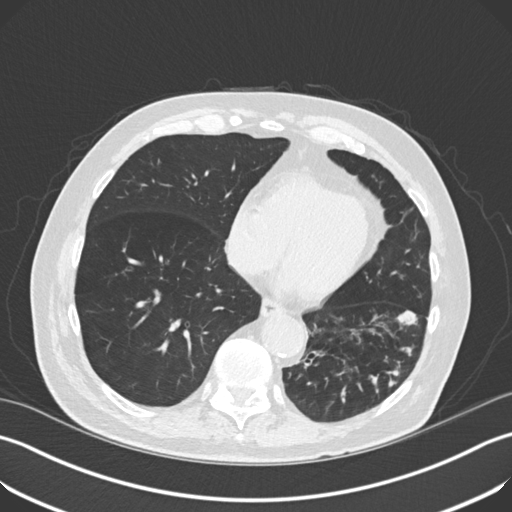
[im 58/156  mediastinal]
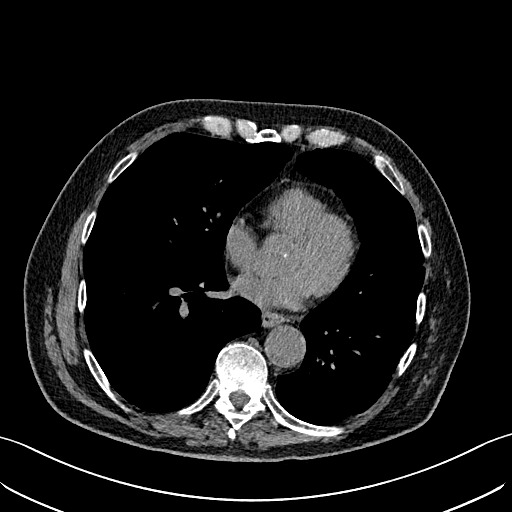
[im 58/156  lung]
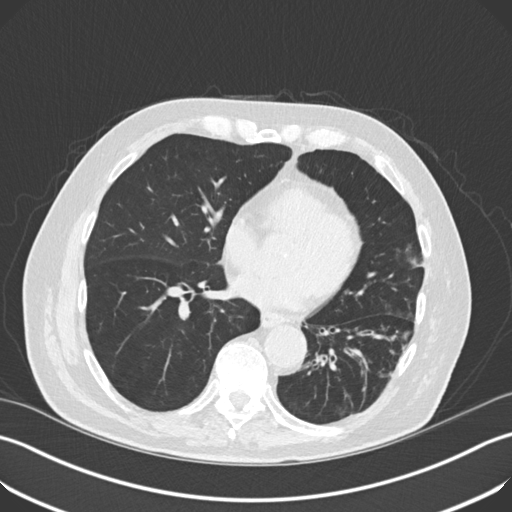
[im 69/156  lung]
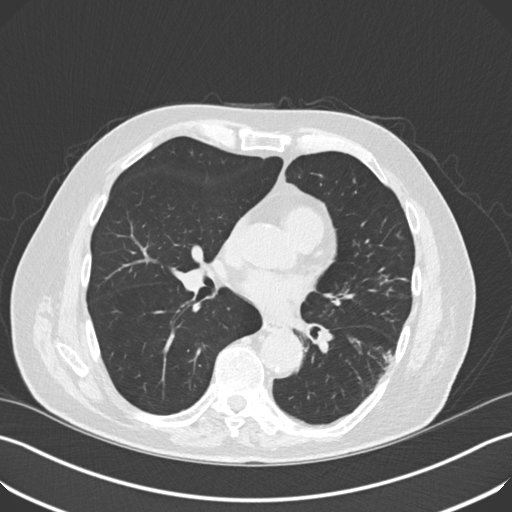
[im 87/156  lung]
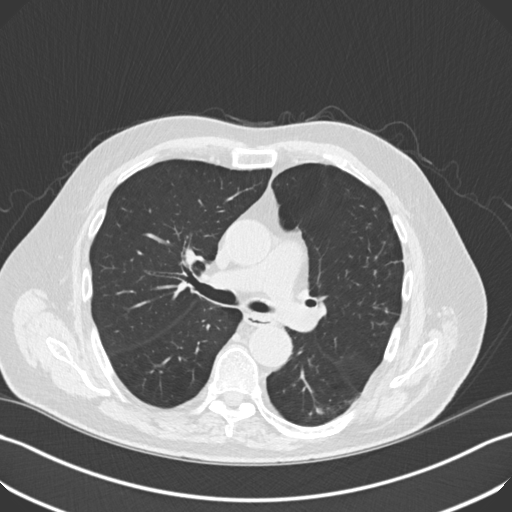
[im 98/156  lung]
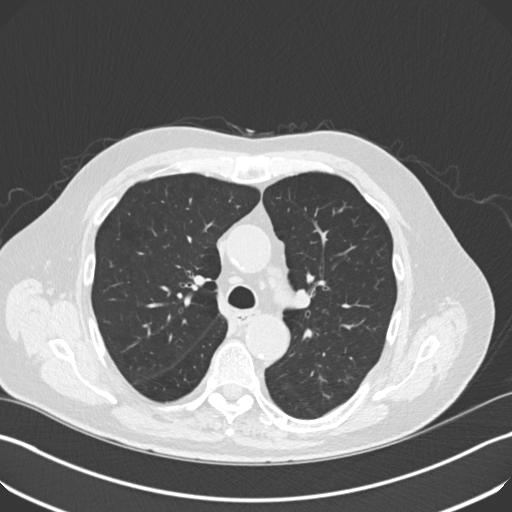
[im 110/156  mediastinal]
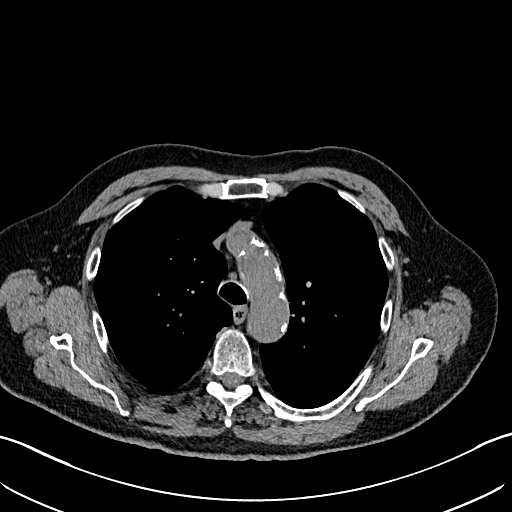
[im 110/156  lung]
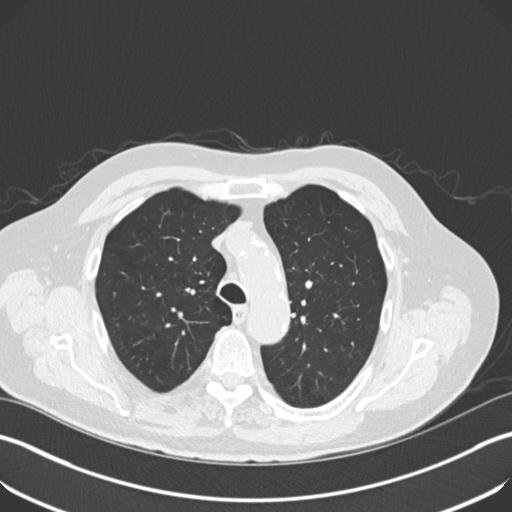
[im 121/156  lung]
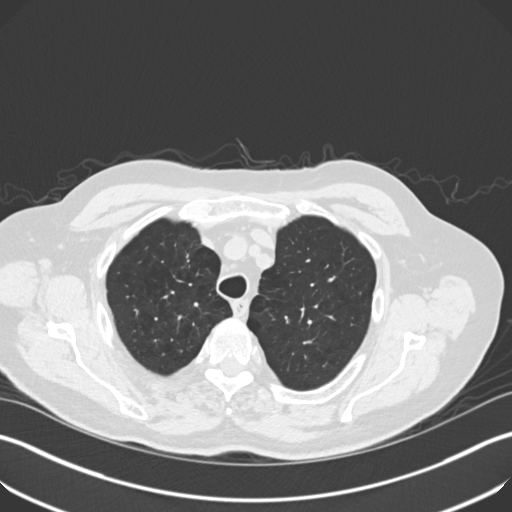
[im 133/156  lung]
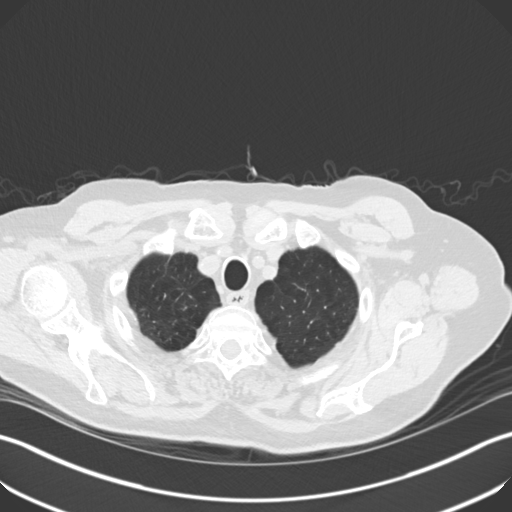
[im 144/156  lung]
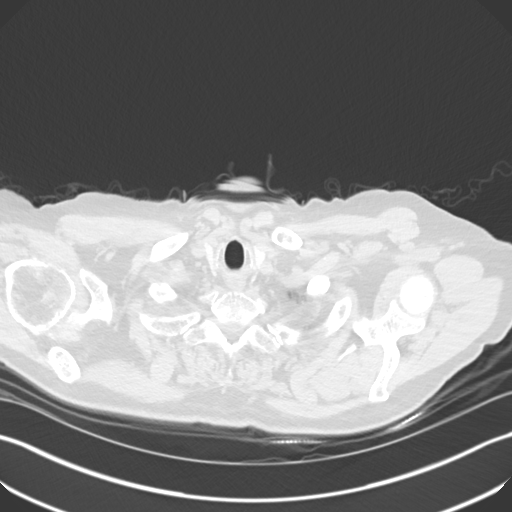

[Series 5: coronal · coronal · 0.63mm/px · 3 of 136 slices shown]
[im 28/136  lung]
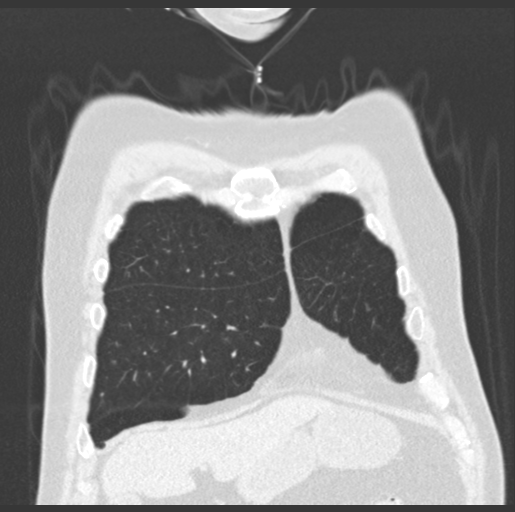
[im 55/136  lung]
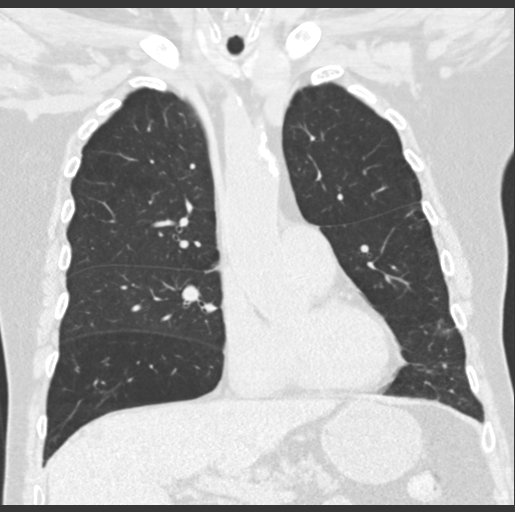
[im 82/136  lung]
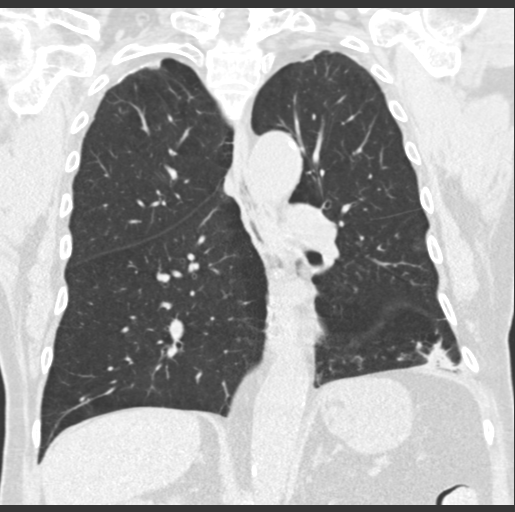

[15 of 36 positions shown; findings below may reference images not displayed]

FINDINGS: Cardiovascular: The heart size appears normal. No pericardial
effusion identified. Aortic atherosclerosis. Calcifications within
the LAD and left main coronary artery is noted.

Mediastinum/Nodes: No enlarged mediastinal or axillary lymph nodes.
Thyroid gland, trachea, and esophagus demonstrate no significant
findings.

Lungs/Pleura: No pleural effusion identified. Moderate changes of
centrilobular emphysema. Diffuse bronchial wall thickening is
identified. There is mild bronchiectasis identified within the left
lower lobe. Multiple peribronchovascular tree-in-bud nodules
identified within the left lower lobe and lingula compatible with
chronic inflammatory or infectious bronchiolitis. Within the left
lower lobe there is a nonspecific nodule measuring 1.5 cm, image 112
of series 3. This is new from 05/08/2016.

Upper Abdomen: No acute abnormality.

Musculoskeletal: No chest wall mass or suspicious bone lesions
identified.
IMPRESSION: 1. Chronic changes within the lingula and left lower lobe are likely
the sequelae of recurrent inflammation. Recurrent aspiration could
also have this appearance.
2. Nonspecific nodule in the left lower lobe measuring 1.5 cm is new
from 05/08/2016. Favor inflammatory or infectious etiologies.
Consider one of the following in 3 months for both low-risk and
high-risk individuals: (a) repeat chest CT, (b) follow-up PET-CT, or
(c) tissue sampling. This recommendation follows the consensus
statement: Guidelines for Management of Incidental Pulmonary Nodules
Detected on CT Images: From the [HOSPITAL] 2512; Radiology
2512; [DATE]. Open As this nodule is favored to represent an
inflammatory process a repeat CT of the chest in 3 months may be the
most appropriate course of action at this point.
3. Aortic Atherosclerosis (VEPG0-YMW.W) and Emphysema (VEPG0-IBJ.B).
Coronary artery calcifications including left main disease noted.
